# Patient Record
Sex: Female | Born: 1935
Health system: Southern US, Community
[De-identification: ages and names within clinical notes are randomized; demographics above are authoritative.]

## PROBLEM LIST (undated history)

## (undated) DIAGNOSIS — I10 Essential (primary) hypertension: Secondary | ICD-10-CM

## (undated) DIAGNOSIS — M349 Systemic sclerosis, unspecified: Secondary | ICD-10-CM

## (undated) DIAGNOSIS — I272 Pulmonary hypertension, unspecified: Secondary | ICD-10-CM

## (undated) DIAGNOSIS — N289 Disorder of kidney and ureter, unspecified: Secondary | ICD-10-CM

## (undated) DIAGNOSIS — M1990 Unspecified osteoarthritis, unspecified site: Secondary | ICD-10-CM

## (undated) DIAGNOSIS — M810 Age-related osteoporosis without current pathological fracture: Secondary | ICD-10-CM

## (undated) DIAGNOSIS — I7300 Raynaud's syndrome without gangrene: Secondary | ICD-10-CM

## (undated) DIAGNOSIS — E039 Hypothyroidism, unspecified: Secondary | ICD-10-CM

## (undated) DIAGNOSIS — IMO0001 Reserved for inherently not codable concepts without codable children: Secondary | ICD-10-CM

## (undated) DIAGNOSIS — E785 Hyperlipidemia, unspecified: Secondary | ICD-10-CM

## (undated) DIAGNOSIS — I509 Heart failure, unspecified: Secondary | ICD-10-CM

## (undated) HISTORY — DX: Hyperlipidemia, unspecified: E78.5

## (undated) HISTORY — DX: Disorder of kidney and ureter, unspecified: N28.9

## (undated) HISTORY — PX: SHOULDER ARTHROSCOPY W/ ROTATOR CUFF REPAIR: SHX2400

## (undated) HISTORY — DX: Heart failure, unspecified: I50.9

## (undated) HISTORY — PX: BREAST BIOPSY: SHX20

## (undated) HISTORY — PX: CHOLECYSTECTOMY, LAPAROSCOPIC: SHX56

## (undated) HISTORY — DX: Systemic sclerosis, unspecified: M34.9

## (undated) HISTORY — PX: CHOLECYSTECTOMY: SHX55

## (undated) HISTORY — PX: CARDIAC CATHETERIZATION: SHX172

---

## 1998-06-17 ENCOUNTER — Ambulatory Visit (HOSPITAL_COMMUNITY): Admission: RE | Admit: 1998-06-17 | Discharge: 1998-06-17 | Payer: Self-pay | Admitting: Obstetrics & Gynecology

## 1998-10-14 ENCOUNTER — Ambulatory Visit (HOSPITAL_BASED_OUTPATIENT_CLINIC_OR_DEPARTMENT_OTHER): Admission: RE | Admit: 1998-10-14 | Discharge: 1998-10-14 | Payer: Self-pay | Admitting: Surgery

## 1999-04-20 ENCOUNTER — Encounter: Admission: RE | Admit: 1999-04-20 | Discharge: 1999-04-20 | Payer: Self-pay | Admitting: Obstetrics and Gynecology

## 1999-10-10 ENCOUNTER — Encounter: Payer: Self-pay | Admitting: Obstetrics and Gynecology

## 1999-10-10 ENCOUNTER — Encounter: Admission: RE | Admit: 1999-10-10 | Discharge: 1999-10-10 | Payer: Self-pay | Admitting: Obstetrics and Gynecology

## 2000-08-09 ENCOUNTER — Encounter: Payer: Self-pay | Admitting: Emergency Medicine

## 2000-08-09 ENCOUNTER — Encounter: Admission: RE | Admit: 2000-08-09 | Discharge: 2000-08-09 | Payer: Self-pay | Admitting: Emergency Medicine

## 2000-10-18 ENCOUNTER — Encounter: Admission: RE | Admit: 2000-10-18 | Discharge: 2000-10-18 | Payer: Self-pay | Admitting: Obstetrics and Gynecology

## 2000-10-18 ENCOUNTER — Encounter: Payer: Self-pay | Admitting: Obstetrics and Gynecology

## 2000-11-05 ENCOUNTER — Other Ambulatory Visit: Admission: RE | Admit: 2000-11-05 | Discharge: 2000-11-05 | Payer: Self-pay | Admitting: Obstetrics and Gynecology

## 2001-08-12 ENCOUNTER — Encounter: Payer: Self-pay | Admitting: *Deleted

## 2001-08-12 ENCOUNTER — Encounter: Admission: RE | Admit: 2001-08-12 | Discharge: 2001-08-12 | Payer: Self-pay | Admitting: *Deleted

## 2001-10-21 ENCOUNTER — Encounter: Admission: RE | Admit: 2001-10-21 | Discharge: 2001-10-21 | Payer: Self-pay | Admitting: Family Medicine

## 2001-10-21 ENCOUNTER — Encounter: Payer: Self-pay | Admitting: Family Medicine

## 2002-11-05 ENCOUNTER — Encounter: Payer: Self-pay | Admitting: Family Medicine

## 2002-11-05 ENCOUNTER — Encounter: Admission: RE | Admit: 2002-11-05 | Discharge: 2002-11-05 | Payer: Self-pay | Admitting: Family Medicine

## 2003-01-22 ENCOUNTER — Ambulatory Visit (HOSPITAL_COMMUNITY): Admission: RE | Admit: 2003-01-22 | Discharge: 2003-01-22 | Payer: Self-pay | Admitting: Gastroenterology

## 2003-11-18 ENCOUNTER — Emergency Department (HOSPITAL_COMMUNITY): Admission: EM | Admit: 2003-11-18 | Discharge: 2003-11-18 | Payer: Self-pay | Admitting: Emergency Medicine

## 2003-12-08 ENCOUNTER — Encounter: Admission: RE | Admit: 2003-12-08 | Discharge: 2003-12-08 | Payer: Self-pay | Admitting: Family Medicine

## 2004-02-08 ENCOUNTER — Other Ambulatory Visit: Admission: RE | Admit: 2004-02-08 | Discharge: 2004-02-08 | Payer: Self-pay | Admitting: Family Medicine

## 2005-03-23 ENCOUNTER — Encounter: Admission: RE | Admit: 2005-03-23 | Discharge: 2005-03-23 | Payer: Self-pay | Admitting: Family Medicine

## 2005-10-12 ENCOUNTER — Other Ambulatory Visit: Admission: RE | Admit: 2005-10-12 | Discharge: 2005-10-12 | Payer: Self-pay | Admitting: Family Medicine

## 2006-05-17 ENCOUNTER — Encounter: Admission: RE | Admit: 2006-05-17 | Discharge: 2006-05-17 | Payer: Self-pay | Admitting: Family Medicine

## 2007-05-20 ENCOUNTER — Encounter: Admission: RE | Admit: 2007-05-20 | Discharge: 2007-05-20 | Payer: Self-pay | Admitting: Family Medicine

## 2008-01-22 ENCOUNTER — Emergency Department (HOSPITAL_COMMUNITY): Admission: EM | Admit: 2008-01-22 | Discharge: 2008-01-22 | Payer: Self-pay | Admitting: Family Medicine

## 2008-01-23 ENCOUNTER — Encounter (HOSPITAL_COMMUNITY): Payer: Self-pay | Admitting: Family Medicine

## 2008-01-23 ENCOUNTER — Ambulatory Visit (HOSPITAL_COMMUNITY): Admission: RE | Admit: 2008-01-23 | Discharge: 2008-01-23 | Payer: Self-pay | Admitting: Family Medicine

## 2008-01-23 ENCOUNTER — Ambulatory Visit: Payer: Self-pay | Admitting: Surgery

## 2008-05-20 ENCOUNTER — Encounter: Admission: RE | Admit: 2008-05-20 | Discharge: 2008-05-20 | Payer: Self-pay | Admitting: Family Medicine

## 2009-06-10 ENCOUNTER — Encounter: Admission: RE | Admit: 2009-06-10 | Discharge: 2009-06-10 | Payer: Self-pay | Admitting: Family Medicine

## 2010-06-16 ENCOUNTER — Other Ambulatory Visit: Payer: Self-pay | Admitting: Family Medicine

## 2010-06-16 DIAGNOSIS — Z1231 Encounter for screening mammogram for malignant neoplasm of breast: Secondary | ICD-10-CM

## 2010-06-23 ENCOUNTER — Ambulatory Visit
Admission: RE | Admit: 2010-06-23 | Discharge: 2010-06-23 | Disposition: A | Discharge status: Home / Self Care | Payer: BC Managed Care – PPO | Source: Ambulatory Visit | Attending: Family Medicine | Admitting: Family Medicine

## 2010-06-23 DIAGNOSIS — Z1231 Encounter for screening mammogram for malignant neoplasm of breast: Secondary | ICD-10-CM

## 2010-07-14 ENCOUNTER — Other Ambulatory Visit: Payer: Self-pay | Admitting: Podiatry

## 2010-09-09 NOTE — Op Note (Signed)
   NAME:  Meghan Wise, Meghan Wise                       ACCOUNT NO.:  [id number]   MEDICAL RECORD NO.:  [id number]                   PATIENT TYPE:  AMB   LOCATION:  ENDO                                 FACILITY:  [facility]   PHYSICIAN:  Earle Gell, M.D.                DATE OF BIRTH:  10-14-1935   DATE OF PROCEDURE:  01/22/2003  DATE OF DISCHARGE:                                 OPERATIVE REPORT   PROCEDURE:  Screening colonoscopy.   PROCEDURE INDICATION:  [name] is a 75 year old female scheduled  to undergo her first screening colonoscopy with polypectomy to prevent colon  cancer.   ENDOSCOPIST:  Earle Gell, M.D.   PREMEDICATION:  Versed 10 mg, Demerol 50 mg.   DESCRIPTION OF PROCEDURE:  After obtaining informed consent, [name]  was placed in the left lateral decubitus position.  I administered  intravenous Demerol and intravenous Versed to achieve conscious sedation for  the procedure.  The patient's blood pressure, oxygen saturation, and cardiac  rhythm were monitored throughout the procedure and documented in the medical  record.   Anal inspection was normal.  Digital rectal exam was normal.  The Olympus  adjustable pediatric colonoscope was introduced into the rectum and advanced  to the cecum.  Colonic preparation for the exam today was excellent.   Rectum normal.   Sigmoid colon and descending colon:  Extensive left colonic diverticulosis.   Splenic flexure normal.   Transverse colon normal.   Hepatic flexure normal.   Ascending colon normal.   Cecum and ileocecal valve normal.    ASSESSMENT:  Extensive left colonic diverticulosis; otherwise normal  proctocolonoscopy to the cecum.  No endoscopic evidence for the presence of  colorectal neoplasia.                                               Earle Gell, M.D.    MJ/MEDQ  D:  01/22/2003  T:  01/22/2003  Job:  [id number]   cc:   Milford Cage. Laurann Montana, M.D.  [place] [place]  [place] [address]  Fax: [phone]

## 2010-10-05 ENCOUNTER — Other Ambulatory Visit: Payer: Self-pay | Admitting: Family Medicine

## 2010-10-05 ENCOUNTER — Ambulatory Visit
Admission: RE | Admit: 2010-10-05 | Discharge: 2010-10-05 | Disposition: A | Discharge status: Home / Self Care | Payer: Medicare Other | Source: Ambulatory Visit | Attending: Family Medicine | Admitting: Family Medicine

## 2011-01-23 LAB — D-DIMER, QUANTITATIVE: D-Dimer, Quant: 0.5 — ABNORMAL HIGH

## 2011-06-26 ENCOUNTER — Other Ambulatory Visit: Payer: Self-pay | Admitting: Family Medicine

## 2011-06-30 ENCOUNTER — Other Ambulatory Visit: Payer: Self-pay | Admitting: Family Medicine

## 2011-06-30 DIAGNOSIS — Z1231 Encounter for screening mammogram for malignant neoplasm of breast: Secondary | ICD-10-CM

## 2011-07-07 ENCOUNTER — Ambulatory Visit: Payer: Medicare Other

## 2011-07-12 ENCOUNTER — Ambulatory Visit
Admission: RE | Admit: 2011-07-12 | Discharge: 2011-07-12 | Disposition: A | Discharge status: Home / Self Care | Payer: Medicare Other | Source: Ambulatory Visit | Attending: Family Medicine | Admitting: Family Medicine

## 2011-07-12 DIAGNOSIS — Z1231 Encounter for screening mammogram for malignant neoplasm of breast: Secondary | ICD-10-CM

## 2012-05-24 ENCOUNTER — Inpatient Hospital Stay (HOSPITAL_COMMUNITY): Payer: Medicare Other

## 2012-05-24 ENCOUNTER — Encounter: Payer: Self-pay | Admitting: Internal Medicine

## 2012-05-24 ENCOUNTER — Inpatient Hospital Stay (HOSPITAL_COMMUNITY)
Admission: AD | Admit: 2012-05-24 | Discharge: 2012-05-28 | DRG: 640 | Disposition: A | Discharge status: Home / Self Care | Payer: Medicare Other | Source: Ambulatory Visit | Attending: Internal Medicine | Admitting: Internal Medicine

## 2012-05-24 ENCOUNTER — Encounter (HOSPITAL_COMMUNITY): Payer: Self-pay | Admitting: Internal Medicine

## 2012-05-24 DIAGNOSIS — N179 Acute kidney failure, unspecified: Secondary | ICD-10-CM

## 2012-05-24 DIAGNOSIS — E039 Hypothyroidism, unspecified: Secondary | ICD-10-CM | POA: Diagnosis present

## 2012-05-24 DIAGNOSIS — I129 Hypertensive chronic kidney disease with stage 1 through stage 4 chronic kidney disease, or unspecified chronic kidney disease: Secondary | ICD-10-CM | POA: Diagnosis present

## 2012-05-24 DIAGNOSIS — E860 Dehydration: Secondary | ICD-10-CM | POA: Diagnosis present

## 2012-05-24 DIAGNOSIS — E673 Hypervitaminosis D: Principal | ICD-10-CM | POA: Diagnosis present

## 2012-05-24 DIAGNOSIS — K5900 Constipation, unspecified: Secondary | ICD-10-CM | POA: Diagnosis present

## 2012-05-24 DIAGNOSIS — M1990 Unspecified osteoarthritis, unspecified site: Secondary | ICD-10-CM

## 2012-05-24 DIAGNOSIS — I5032 Chronic diastolic (congestive) heart failure: Secondary | ICD-10-CM | POA: Diagnosis present

## 2012-05-24 DIAGNOSIS — E43 Unspecified severe protein-calorie malnutrition: Secondary | ICD-10-CM | POA: Diagnosis present

## 2012-05-24 DIAGNOSIS — E876 Hypokalemia: Secondary | ICD-10-CM

## 2012-05-24 DIAGNOSIS — N182 Chronic kidney disease, stage 2 (mild): Secondary | ICD-10-CM

## 2012-05-24 DIAGNOSIS — R627 Adult failure to thrive: Secondary | ICD-10-CM | POA: Diagnosis present

## 2012-05-24 DIAGNOSIS — IMO0002 Reserved for concepts with insufficient information to code with codable children: Secondary | ICD-10-CM

## 2012-05-24 DIAGNOSIS — Z79899 Other long term (current) drug therapy: Secondary | ICD-10-CM

## 2012-05-24 DIAGNOSIS — D649 Anemia, unspecified: Secondary | ICD-10-CM | POA: Diagnosis present

## 2012-05-24 DIAGNOSIS — I7300 Raynaud's syndrome without gangrene: Secondary | ICD-10-CM | POA: Diagnosis present

## 2012-05-24 DIAGNOSIS — R599 Enlarged lymph nodes, unspecified: Secondary | ICD-10-CM | POA: Diagnosis present

## 2012-05-24 DIAGNOSIS — R590 Localized enlarged lymph nodes: Secondary | ICD-10-CM

## 2012-05-24 DIAGNOSIS — K219 Gastro-esophageal reflux disease without esophagitis: Secondary | ICD-10-CM | POA: Diagnosis present

## 2012-05-24 DIAGNOSIS — I509 Heart failure, unspecified: Secondary | ICD-10-CM | POA: Diagnosis present

## 2012-05-24 DIAGNOSIS — I10 Essential (primary) hypertension: Secondary | ICD-10-CM | POA: Diagnosis present

## 2012-05-24 DIAGNOSIS — Z7982 Long term (current) use of aspirin: Secondary | ICD-10-CM

## 2012-05-24 DIAGNOSIS — I5033 Acute on chronic diastolic (congestive) heart failure: Secondary | ICD-10-CM | POA: Diagnosis present

## 2012-05-24 DIAGNOSIS — J811 Chronic pulmonary edema: Secondary | ICD-10-CM | POA: Diagnosis not present

## 2012-05-24 DIAGNOSIS — M810 Age-related osteoporosis without current pathological fracture: Secondary | ICD-10-CM

## 2012-05-24 HISTORY — DX: Raynaud's syndrome without gangrene: I73.00

## 2012-05-24 HISTORY — DX: Age-related osteoporosis without current pathological fracture: M81.0

## 2012-05-24 HISTORY — DX: Essential (primary) hypertension: I10

## 2012-05-24 HISTORY — DX: Hypothyroidism, unspecified: E03.9

## 2012-05-24 HISTORY — DX: Unspecified osteoarthritis, unspecified site: M19.90

## 2012-05-24 LAB — URINALYSIS, ROUTINE W REFLEX MICROSCOPIC
Hgb urine dipstick: NEGATIVE
Nitrite: NEGATIVE
Protein, ur: NEGATIVE mg/dL
Specific Gravity, Urine: 1.013 (ref 1.005–1.030)
Urobilinogen, UA: 0.2 mg/dL (ref 0.0–1.0)

## 2012-05-24 LAB — BASIC METABOLIC PANEL
Calcium: >15 mg/dL (ref 8.4–10.5)
GFR calc Af Amer: 14 mL/min — ABNORMAL LOW (ref >90)
GFR calc non Af Amer: 12 mL/min — ABNORMAL LOW (ref >90)
Glucose, Bld: 87 mg/dL (ref 70–99)
Sodium: 138 mEq/L (ref 135–145)

## 2012-05-24 LAB — URINE MICROSCOPIC-ADD ON

## 2012-05-24 MED ORDER — ADULT MULTIVITAMIN W/MINERALS CH
1.0000 | ORAL_TABLET | Freq: Every morning | ORAL | Status: DC
  Administered 2012-05-24 – 2012-05-28 (×5): 1 via ORAL
  Filled 2012-05-24 (×5): qty 1

## 2012-05-24 MED ORDER — FA-PYRIDOXINE-CYANOCOBALAMIN 2.5-25-2 MG PO TABS
1.0000 | ORAL_TABLET | Freq: Every day | ORAL | Status: DC
  Administered 2012-05-24 – 2012-05-28 (×5): 1 via ORAL
  Filled 2012-05-24 (×5): qty 1

## 2012-05-24 MED ORDER — LEVOTHYROXINE SODIUM 50 MCG PO TABS
50.0000 ug | ORAL_TABLET | Freq: Every day | ORAL | Status: DC
  Administered 2012-05-25 – 2012-05-28 (×4): 50 ug via ORAL
  Filled 2012-05-24 (×6): qty 1

## 2012-05-24 MED ORDER — SODIUM CHLORIDE 0.9 % IJ SOLN
3.0000 mL | Freq: Two times a day (BID) | INTRAMUSCULAR | Status: DC
  Administered 2012-05-24 – 2012-05-28 (×5): 3 mL via INTRAVENOUS

## 2012-05-24 MED ORDER — PANTOPRAZOLE SODIUM 40 MG PO TBEC
40.0000 mg | DELAYED_RELEASE_TABLET | Freq: Every day | ORAL | Status: DC
  Administered 2012-05-25 – 2012-05-28 (×4): 40 mg via ORAL
  Filled 2012-05-24 (×2): qty 1

## 2012-05-24 MED ORDER — ACETAMINOPHEN 650 MG RE SUPP: 650.0000 mg | Freq: Four times a day (QID) | RECTAL | Status: DC | PRN

## 2012-05-24 MED ORDER — SODIUM CHLORIDE 0.9 % IV SOLN
INTRAVENOUS | Status: DC
  Administered 2012-05-24 – 2012-05-26 (×6): via INTRAVENOUS

## 2012-05-24 MED ORDER — POLYETHYLENE GLYCOL 3350 17 G PO PACK
17.0000 g | PACK | Freq: Every day | ORAL | Status: DC | PRN
  Filled 2012-05-24: qty 1

## 2012-05-24 MED ORDER — ASPIRIN EC 325 MG PO TBEC
325.0000 mg | DELAYED_RELEASE_TABLET | Freq: Every day | ORAL | Status: DC
  Administered 2012-05-24 – 2012-05-28 (×5): 325 mg via ORAL
  Filled 2012-05-24 (×5): qty 1

## 2012-05-24 MED ORDER — SODIUM CHLORIDE 0.9 % IV BOLUS (SEPSIS)
500.0000 mL | Freq: Once | INTRAVENOUS | Status: AC
  Administered 2012-05-24: 500 mL via INTRAVENOUS

## 2012-05-24 MED ORDER — SIMVASTATIN 20 MG PO TABS
20.0000 mg | ORAL_TABLET | Freq: Every evening | ORAL | Status: DC
  Administered 2012-05-24 – 2012-05-28 (×5): 20 mg via ORAL
  Filled 2012-05-24 (×5): qty 1

## 2012-05-24 MED ORDER — ACETAMINOPHEN 325 MG PO TABS: 650.0000 mg | ORAL_TABLET | Freq: Four times a day (QID) | ORAL | Status: DC | PRN

## 2012-05-24 MED ORDER — SENNA 8.6 MG PO TABS
1.0000 | ORAL_TABLET | Freq: Two times a day (BID) | ORAL | Status: DC
  Administered 2012-05-24 – 2012-05-27 (×4): 8.6 mg via ORAL
  Filled 2012-05-24 (×8): qty 1

## 2012-05-24 MED ORDER — ENOXAPARIN SODIUM 30 MG/0.3ML ~~LOC~~ SOLN
30.0000 mg | SUBCUTANEOUS | Status: DC
  Administered 2012-05-24 – 2012-05-27 (×4): 30 mg via SUBCUTANEOUS
  Filled 2012-05-24 (×6): qty 0.3

## 2012-05-24 NOTE — H&P (Signed)
Meghan Wise History and Physical  Meghan Wise JKD:[id number] DOB: 06-06-1935 DOA: 05/24/2012  Referring physician: PCP PCP: Meghan Casco, MD  Specialists: None  Chief Complaint: Weak  HPI: Meghan Wise is a 77 y.o. female without any significant past medical history was referred from primary care physician office for a 2 month course of progressive weakness, 20 pound weight loss, bone pains, constipation, failure to thrive. Labs done at the primary care physician office revealed severe hypercalcemia with low PTH. Patient has been taking 600 mg of calcium for osteoporosis. She also has been on HCTZ. She was also found to have a TSH of 9.4 and was started on Synthroid yesterday. Workup in the primary care physician office also revealed acute renal failure with a creatinine of 3. The patient has been on an ACE inhibitor until yesterday.   Review of Systems:  Positive for chronic cough, and nonproductive, not associated with chills The patient denies anorexia, fever, weight loss,, vision loss, decreased hearing, hoarseness, syncope, dyspnea on exertion, peripheral edema, balance deficits, hemoptysis, abdominal pain, melena, hematochezia, severe indigestion/heartburn, hematuria, incontinence, genital sores, muscle weakness, suspicious skin lesions, transient blindness, difficulty walking, depression, unusual weight change, abnormal bleeding, enlarged lymph nodes, angioedema, and breast masses.    Past Medical History  Diagnosis Date  . Osteoporosis   . HTN (hypertension)   . Osteoarthritis   . Raynaud disease   . Hypothyroidism    Past Surgical History  Procedure Date  . Cholecystectomy, laparoscopic    Social History:  reports that she has quit smoking. Her smoking use included Cigarettes. She quit after 60 years of use. She has never used smokeless tobacco. She reports that she does not drink alcohol or use illicit drugs. Lives home with husband   Allergies   Allergen Reactions  . Ace Inhibitors Cough  . Codeine Nausea Only  . Sulfa Antibiotics Hives    Family History  Problem Relation Age of Onset  . Cancer Brother           Physical Exam: Filed Vitals:   05/24/12 1058 05/24/12 1439  BP: 145/71 155/66  Pulse: 68 71  Temp: 98.5 F (36.9 C) 97.8 F (36.6 C)  TempSrc: Oral Oral  Resp: 18 20  Height: _0  (1.499 m)   Weight: 48.081 kg (106 lb)   SpO2: 92%      General:  Lethargic and somnolent  Eyes: Pupils are 3 mm, symmetric and reactive  ENT: Significant oral mucosal dryness  Neck: No jugular venous distention  Cardiovascular: Regular rate and rhythm without murmurs rubs or gallops  Respiratory: Clear to auscultation bilaterally  Abdomen: Soft, nontender, bowel sounds present  Skin: Pale dry no rashes  Musculoskeletal: Intact muscle bulk and tone  Psychiatric: Euthymic  Neurologic: Cranial nerves 2-12 intact, strength 5/5 all 4, sensation intact  Labs on Admission:  Basic Metabolic Panel:  Lab 09/98/33 1153  NA 138  K 3.6  CL 98  CO2 27  GLUCOSE 87  BUN 52*  CREATININE 3.39*  CALCIUM >15.0*  MG --  PHOS --   Liver Function Tests: No results found for this basename: AST:5,ALT:5,ALKPHOS:5,BILITOT:5,PROT:5,ALBUMIN:5 in the last 168 hours No results found for this basename: LIPASE:5,AMYLASE:5 in the last 168 hours No results found for this basename: AMMONIA:5 in the last 168 hours CBC: No results found for this basename: WBC:5,NEUTROABS:5,HGB:5,HCT:5,MCV:5,PLT:5 in the last 168 hours Cardiac Enzymes: No results found for this basename: CKTOTAL:5,CKMB:5,CKMBINDEX:5,TROPONINI:5 in the last 168 hours  BNP (last 3 results) No  results found for this basename: PROBNP:3 in the last 8760 hours CBG: No results found for this basename: GLUCAP:5 in the last 168 hours  Radiological Exams on Admission: No results found.    Assessment/Plan Active Problems:  Hypercalcemia  Dehydration  ARF (acute  renal failure)  CKD (chronic kidney disease) stage 2, GFR 60-89 ml/min  Osteoporosis  HTN (hypertension)  Osteoarthritis  Raynaud disease  Hypothyroidism   1. Severe hypercalcemia- etiology is unclear. Hyperparathyroidism has been excluded with a low PTH level. Other possibilities are exogenous calcium supplementation combined with HCTZ use and severe dehydration. Other possibilities or parathyroid related hormone production, sarcoidosis, lymphoma or myeloma. We will start treatment with IV fluids as the patient is severely volume depleted and follow calcium levels. We will complete workup with vitamin D level, ACE level, serum protein electrophoresis,PTH rp level. 2. Newly diagnosed hypothyroidism-start Synthroid 50 mcg daily 3. Acute renal failure-most likely from dehydration in the setting of severe hypercalcemia. Will obtain urinalysis to look for an active sediment. We'll start IV fluids, monitor urine output and serial basic metabolic profiles 4. Hypertension-for now we will hold off on antihypertensive medications 5. Chronic cough in a patient with a long history of tobacco abuse, who has lost 20 pounds. We will obtain a chest CT to rule out lung cancer. We'll start PPI   Code Status: full  Family Communication: husband at bedside  Disposition Plan: home   Anticipated LOS 4 days  Time spent: 1 hour  Meghan Wise Meghan Wise Pager [phone]  If 7PM-7AM, please contact night-coverage [email or web address] Password [facility] 05/24/2012, 2:55 PM

## 2012-05-24 NOTE — Progress Notes (Signed)
Patient ID: Meghan Wise, female   DOB: [birth year], 77 y.o.   MRN: [id number] Called from office by PCP Patient presented with weakness and FTT Labs indicating ARF creatinine 3.08 Hypercalcemia at 16.4 TSH at 9.44 And low PTH All of these findings are new. Plan for admission for iv fluids and w/u of new hypercalcemia Records being faxed to [facility] Meghan [facility]

## 2012-05-25 ENCOUNTER — Inpatient Hospital Stay (HOSPITAL_COMMUNITY): Payer: Medicare Other

## 2012-05-25 DIAGNOSIS — N182 Chronic kidney disease, stage 2 (mild): Secondary | ICD-10-CM

## 2012-05-25 DIAGNOSIS — K5900 Constipation, unspecified: Secondary | ICD-10-CM

## 2012-05-25 LAB — BASIC METABOLIC PANEL
Calcium: 14 mg/dL (ref 8.4–10.5)
GFR calc non Af Amer: 13 mL/min — ABNORMAL LOW (ref >90)
Sodium: 139 mEq/L (ref 135–145)

## 2012-05-25 MED ORDER — BOOST / RESOURCE BREEZE PO LIQD
1.0000 | Freq: Three times a day (TID) | ORAL | Status: DC
  Administered 2012-05-25 – 2012-05-28 (×7): 1 via ORAL

## 2012-05-25 MED ORDER — IOHEXOL 300 MG/ML  SOLN
25.0000 mL | INTRAMUSCULAR | Status: AC
  Administered 2012-05-25 (×2): 25 mL via ORAL

## 2012-05-25 MED ORDER — HYDROCOD POLST-CHLORPHEN POLST 10-8 MG/5ML PO LQCR: 5.0000 mL | Freq: Two times a day (BID) | ORAL | Status: DC | PRN

## 2012-05-25 MED ORDER — POLYETHYLENE GLYCOL 3350 17 G PO PACK
68.0000 g | PACK | Freq: Once | ORAL | Status: DC
  Filled 2012-05-25 (×2): qty 4

## 2012-05-25 NOTE — Progress Notes (Signed)
MD on call notified of pt c/o of SOB upon ambulation to the restroom. Lungs sounds clear. SpO2 88% on RA. 2L of O2 applied via [place] pt current sat is 94%. Will continue to monitor the pt. Meghan Wise

## 2012-05-25 NOTE — Progress Notes (Signed)
INITIAL NUTRITION ASSESSMENT  DOCUMENTATION CODES Per approved criteria  -Severe malnutrition in the context of chronic illness -Underweight   Pt meets criteria for SEVERE MALNUTRITION in the context of chronic illness as evidenced by 27% weight loss x 1 year, poor intake estimated to be < 75% of her estimated needs in > 1 month.   INTERVENTION: Resource Breeze po TID, each supplement provides 250 kcal and 9 grams of protein.  NUTRITION DIAGNOSIS: Malnutrition related to chronic illness as evidenced by  27% weight loss x 1 year, poor intake estimated to be < 75% of her estimated needs in > 1 month.  Goal: Pt to meet >/= 90% of their estimated nutrition needs.   Monitor:  PO intake, supplement acceptance, weight  Reason for Assessment: Malnutrition Screening Tool  77 y.o. female  Admitting Dx:  Progressive weakness  ASSESSMENT: Pt admitted with hx of 2 month course of progressive weakness, 20 pound weight loss, bone pains, constipation, failure to thrive from PCP.  Pt being treated for new hypercalcemia, ARF and elevated TSH of 9.44.  Pt confirms weight loss and poor appetite x 1 year. States husband has been sick so she has been unable to get to the doctor herself.    Height: Ht Readings from Last 1 Encounters:  05/24/12 _0  (1.499 m)    Weight: Wt Readings from Last 1 Encounters:  05/25/12 88 lb 3.2 oz (40.007 kg)    Ideal Body Weight: 44.6 kg  % Ideal Body Weight: 90%  Wt Readings from Last 10 Encounters:  05/25/12 88 lb 3.2 oz (40.007 kg)    Usual Body Weight: 120 lb 1 year ago  % Usual Body Weight: 73%  BMI:  Body mass index is 17.81 kg/(m^2). Underweight  Estimated Nutritional Needs: Kcal: 1400-1600 Protein: 60-75 grams Fluid: >1.5 L/day  Skin: no issues noted  Diet Order: General Meal Completion: 75%  EDUCATION NEEDS: -No education needs identified at this time   Intake/Output Summary (Last 24 hours) at 05/25/12 1238 Last data filed at  05/25/12 0959  Gross per 24 hour  Intake    720 ml  Output   1227 ml  Net   -507 ml    Last BM: PTA   Labs:   Lab 05/25/12 0500 05/24/12 1153  NA 139 138  K 3.4* 3.6  CL 103 98  CO2 29 27  BUN 49* 52*  CREATININE 3.35* 3.39*  CALCIUM 14.0* >15.0*  MG -- --  PHOS -- --  GLUCOSE 86 87    CBG (last 3)  No results found for this basename: GLUCAP:3 in the last 72 hours  Scheduled Meds:   . aspirin EC  325 mg Oral Daily  . enoxaparin (LOVENOX) injection  30 mg Subcutaneous Q24H  . folic acid-pyridoxine-cyancobalamin  1 tablet Oral Daily  . iohexol  25 mL Oral Q1 Hr x 2  . levothyroxine  50 mcg Oral QAC breakfast  . multivitamin with minerals  1 tablet Oral q morning - 10a  . pantoprazole  40 mg Oral Q1200  . polyethylene glycol  68 g Oral Once  . senna  1 tablet Oral BID  . simvastatin  20 mg Oral QPM  . sodium chloride  3 mL Intravenous Q12H    Continuous Infusions:   . sodium chloride 150 mL/hr at 05/25/12 0221    Past Medical History  Diagnosis Date  . Osteoporosis   . HTN (hypertension)   . Osteoarthritis   . Raynaud disease   .  Hypothyroidism     Past Surgical History  Procedure Date  . Cholecystectomy, laparoscopic     Maylon Peppers RD, LDN, [place] Weekend/After Hours Pager

## 2012-05-25 NOTE — Progress Notes (Signed)
[facility] PROGRESS NOTE  Meghan Wise TDV:[id number] DOB: 1935-05-02 DOA: 05/24/2012 PCP: Meghan Casco, MD  Assessment/Plan: 1. Severe symptomatic hypercalcemia - multifactorial. Patient on oral calcium and HCTZ prior to admission. I am also suspecting sarcoidosis or lymphoma. ACE level elevated. CT scan of the chest with pulmonary nodules and mediastinal adenopathy. CT scan abdomen and pelvis plan for today to rule out primary abdominal or gynecological malignancy. The patient was severely volume depleted on admission and she was started on IV normal saline at 150 cc an hour.  By February 1 the patient is still deemed to be volume depleted - and we plan to continue IV normal saline. Calcium level has decreased to 14. Hyperparathyroidism ruled out by a low PTH level.  2. Acute kidney injury - most likely secondary to ACE inhibitor and dehydration driven by hypercalcemia. Adequate urinary output. No major hydronephrosis on imaging. Urinary sediment without red blood cells or protein making glomerulonephritis highly unlikely. We'll continue IV fluids and continue to monitor renal function 3. Hypothyroidism-continue Synthroid 4. Severe constipation-prescribed MiraLAX bowel prep February 1  Code Status: full Family Communication: husband (indicate person spoken with, relationship, and if by phone, the number) Disposition Plan: home   Consultants:  none  Procedures:  CT chest   HPI/Subjective: Weak and dehydrated  Objective: Filed Vitals:   05/24/12 1058 05/24/12 1439 05/24/12 2100 05/25/12 0601  BP: 145/71 155/66 144/63 158/64  Pulse: 68 71 70 56  Temp: 98.5 F (36.9 C) 97.8 F (36.6 C) 98.2 F (36.8 C) 98 F (36.7 C)  TempSrc: Oral Oral    Resp: _0 Height: _1  (1.499 m)     Weight: 48.081 kg (106 lb)   40.007 kg (88 lb 3.2 oz)  SpO2: 92%  92% 97%    Intake/Output Summary (Last 24 hours) at 05/25/12 1331 Last data filed at 05/25/12 0959   Gross per 24 hour  Intake    720 ml  Output   1227 ml  Net   -507 ml   Filed Weights   05/24/12 1058 05/25/12 0601  Weight: 48.081 kg (106 lb) 40.007 kg (88 lb 3.2 oz)    Exam:   General:  Alert and oriented x3  Cardiovascular: Regular rate and rhythm without murmurs rubs or gallops  Respiratory: Clear to auscultation bilaterally  Abdomen: Soft nontender bowel sounds present  Data Reviewed: Basic Metabolic Panel:  Lab 10/17/92 0500 05/24/12 1153  NA 139 138  K 3.4* 3.6  CL 103 98  CO2 29 27  GLUCOSE 86 87  BUN 49* 52*  CREATININE 3.35* 3.39*  CALCIUM 14.0* >15.0*  MG -- --  PHOS -- --   Liver Function Tests: No results found for this basename: AST:5,ALT:5,ALKPHOS:5,BILITOT:5,PROT:5,ALBUMIN:5 in the last 168 hours No results found for this basename: LIPASE:5,AMYLASE:5 in the last 168 hours No results found for this basename: AMMONIA:5 in the last 168 hours CBC: No results found for this basename: WBC:5,NEUTROABS:5,HGB:5,HCT:5,MCV:5,PLT:5 in the last 168 hours Cardiac Enzymes: No results found for this basename: CKTOTAL:5,CKMB:5,CKMBINDEX:5,TROPONINI:5 in the last 168 hours BNP (last 3 results) No results found for this basename: PROBNP:3 in the last 8760 hours CBG: No results found for this basename: GLUCAP:5 in the last 168 hours  No results found for this or any previous visit (from the past 240 hour(s)).   Studies: Ct Chest Wo Contrast  05/24/2012  *RADIOLOGY REPORT*  Clinical Data: Cough and weight loss.  CT CHEST WITHOUT CONTRAST  Technique:  Multidetector  CT imaging of the chest was performed following the standard protocol without IV contrast.  Comparison: None  Findings: The chest wall is unremarkable.  No supraclavicular or axillary adenopathy.  Small nodes are noted.  The bony thorax is intact.  No destructive bone lesions or spinal canal compromise.  There are enlarged mediastinal and hilar lymph nodes.  The aorta is normal in caliber.  Coronary artery  calcifications are noted.  The esophagus is grossly normal.  No pericardial effusion.  Heart size is normal.  Examination of the lung parenchyma demonstrates chronic bronchitic changes and probable interstitial lung disease.  There are small pulmonary nodules.  No pulmonary mass or pleural effusion.  No bronchiectasis.  The visualized upper abdomen is grossly normal.  Intra and extrahepatic biliary dilatation is noted.  This could be due to prior cholecystectomy.  Recommend correlation liver function studies.  IMPRESSION:  1.  Mediastinal and hilar lymphadenopathy. 2.  Multiple small pulmonary nodules.  These are indeterminate.  No obvious lung mass.  Metastatic disease is possible.  PET CT may be helpful for further evaluation.   Original Report Authenticated By: Marijo Sanes, M.D.     Scheduled Meds:    . aspirin EC  325 mg Oral Daily  . enoxaparin (LOVENOX) injection  30 mg Subcutaneous Q24H  . folic acid-pyridoxine-cyancobalamin  1 tablet Oral Daily  . iohexol  25 mL Oral Q1 Hr x 2  . levothyroxine  50 mcg Oral QAC breakfast  . multivitamin with minerals  1 tablet Oral q morning - 10a  . pantoprazole  40 mg Oral Q1200  . polyethylene glycol  68 g Oral Once  . senna  1 tablet Oral BID  . simvastatin  20 mg Oral QPM  . sodium chloride  3 mL Intravenous Q12H   Continuous Infusions:    . sodium chloride 150 mL/hr at 05/25/12 0221    Active Problems:  Hypercalcemia  Dehydration  ARF (acute renal failure)  CKD (chronic kidney disease) stage 2, GFR 60-89 ml/min  Osteoporosis  HTN (hypertension)  Osteoarthritis  Raynaud disease  Hypothyroidism  Mediastinal lymphadenopathy  Constipation      Meghan Wise  [facility] Pager [phone]. If 8PM-8AM, please contact night-coverage at [email or web address], password [facility] 05/25/2012, 1:31 PM  LOS: 1 day

## 2012-05-25 NOTE — Progress Notes (Signed)
CRITICAL VALUE ALERT  Critical value received:  Calcium of 14  Date of notification:  05/25/2012   Time of notification:  0553  Critical value read back:yes  Nurse who received alert:  Nevin Bloodgood  MD notified (1st page):  Walden Field MD  Time of first page:  0610  MD notified (2nd page):  Time of second page:  Responding MD:  Walden Field MD  Time MD responded:  [phone]

## 2012-05-26 ENCOUNTER — Inpatient Hospital Stay (HOSPITAL_COMMUNITY): Payer: Medicare Other

## 2012-05-26 DIAGNOSIS — R599 Enlarged lymph nodes, unspecified: Secondary | ICD-10-CM

## 2012-05-26 LAB — HEPATIC FUNCTION PANEL
ALT: 18 U/L (ref 0–35)
AST: 29 U/L (ref 0–37)
Albumin: 2.9 g/dL — ABNORMAL LOW (ref 3.5–5.2)
Alkaline Phosphatase: 54 U/L (ref 39–117)
Total Protein: 6.2 g/dL (ref 6.0–8.3)

## 2012-05-26 LAB — FERRITIN: Ferritin: 60 ng/mL (ref 10–291)

## 2012-05-26 LAB — RETICULOCYTES: Retic Count, Absolute: 47.7 10*3/uL (ref 19.0–186.0)

## 2012-05-26 LAB — BASIC METABOLIC PANEL
CO2: 21 mEq/L (ref 19–32)
Chloride: 112 mEq/L (ref 96–112)
Potassium: 3.2 mEq/L — ABNORMAL LOW (ref 3.5–5.1)
Sodium: 143 mEq/L (ref 135–145)

## 2012-05-26 LAB — CBC
Platelets: 189 10*3/uL (ref 150–400)
RBC: 3.21 MIL/uL — ABNORMAL LOW (ref 3.87–5.11)
WBC: 6.4 10*3/uL (ref 4.0–10.5)

## 2012-05-26 MED ORDER — POLYSACCHARIDE IRON COMPLEX 150 MG PO CAPS
150.0000 mg | ORAL_CAPSULE | Freq: Every day | ORAL | Status: DC
  Administered 2012-05-27 – 2012-05-28 (×2): 150 mg via ORAL
  Filled 2012-05-26 (×2): qty 1

## 2012-05-26 MED ORDER — ALUM & MAG HYDROXIDE-SIMETH 200-200-20 MG/5ML PO SUSP: 15.0000 mL | ORAL | Status: DC | PRN

## 2012-05-26 MED ORDER — FUROSEMIDE 10 MG/ML IJ SOLN
INTRAMUSCULAR | Status: AC
  Filled 2012-05-26: qty 4

## 2012-05-26 MED ORDER — FUROSEMIDE 10 MG/ML IJ SOLN
40.0000 mg | Freq: Once | INTRAMUSCULAR | Status: AC
  Administered 2012-05-26: 40 mg via INTRAVENOUS

## 2012-05-26 MED ORDER — LORAZEPAM 0.5 MG PO TABS
0.5000 mg | ORAL_TABLET | Freq: Four times a day (QID) | ORAL | Status: DC | PRN
  Administered 2012-05-26 (×2): 0.5 mg via ORAL
  Filled 2012-05-26 (×2): qty 1

## 2012-05-26 MED ORDER — POTASSIUM CHLORIDE CRYS ER 20 MEQ PO TBCR
40.0000 meq | EXTENDED_RELEASE_TABLET | Freq: Once | ORAL | Status: AC
  Administered 2012-05-26: 40 meq via ORAL
  Filled 2012-05-26: qty 2

## 2012-05-26 NOTE — Progress Notes (Signed)
Checking on pt, she has voided twice since IV lasix given, o2 sats 99% on 4l [place]. O2 decreased to 2l [place]. Patient feels "a little better!". CXR results texted to MD. Pts crackles a bit more prominent than before. Awaiting return call of md

## 2012-05-26 NOTE — Progress Notes (Signed)
[facility] PROGRESS NOTE  Meghan Wise YME:[id number] DOB: Aug 31, 1935 DOA: 05/24/2012 PCP: Osborne Casco, MD  Assessment/Plan: 1. Severe symptomatic hypercalcemia - multifactorial. Patient on oral calcium and HCTZ prior to admission. I am also suspecting sarcoidosis or lymphoma. ACE level elevated. CT scan of the chest with pulmonary nodules and mediastinal adenopathy. CT scan abdomen and pelvis negative for major intraabdominal malignancy   or gynecological malignancy. The patient was severely volume depleted on admission and she was started on IV normal saline at 150 cc an hour.  By February 1 the patient is still deemed to be volume depleted - and we did continue IV normal saline. Calcium level has decreased to 14 on 2/1 and further decreased to 11.6 by 2/2. Hyperparathyroidism ruled out by a low PTH level done in the office . PTHrP , vitamin D and SPEP still pending  2. Mediastinal adenopathy - DDx - sarcoidosis, lymphoma, metastatic malignancy of unclear primary source. Doubt acute or chronic infectious process . Pulmonary consult 2/2 done by Dr. Melvyn Novas - he recommended outpatient f/u. Marland Kitchen  3. Acute kidney injury - most likely secondary to ACE inhibitor and dehydration driven by hypercalcemia. Adequate urinary output. No major hydronephrosis on imaging. Urinary sediment without red blood cells or protein making glomerulonephritis highly unlikely. Renal function improved with IV fluids 4. Newly diagnosed hypothyroidism- started on  Synthroid 05/23/2012 .  5. Severe constipation-prescribed MiraLAX bowel prep February 1 - with great success.  6. Anemia - Hb dropped after hydration - suspect chronic.  Ferritin is low. We checked  LDH, hapto, retic and they are wnl . B12 done as outpt and normal.  7. Dilated CBD - with normal LFTs as outpt - no GI symptoms.  8. Long history of GERD - started PPI 05/26/12   Code Status: full Family Communication: husband  Disposition Plan:  home   Consultants:  Pulm   Procedures:  CT chest   CT abdomen   HPI/Subjective: Finally feels better   Objective: Filed Vitals:   05/25/12 2100 05/26/12 0255 05/26/12 0641 05/26/12 0650  BP: 162/74 154/55 168/66 144/58  Pulse: 63 58 65   Temp: 98.1 F (36.7 C)  98.1 F (36.7 C)   TempSrc: Oral  Oral   Resp: _0 Height:      Weight:      SpO2: 94% 96% 94%     Intake/Output Summary (Last 24 hours) at 05/26/12 0743 Last data filed at 05/26/12 0538  Gross per 24 hour  Intake    600 ml  Output   1127 ml  Net   -527 ml   Filed Weights   05/24/12 1058 05/25/12 0601  Weight: 48.081 kg (106 lb) 40.007 kg (88 lb 3.2 oz)    Exam:   General:  Alert and oriented x3  Cardiovascular: Regular rate and rhythm without murmurs rubs or gallops  Respiratory: Clear to auscultation bilaterally  Abdomen: Soft nontender bowel sounds present  Data Reviewed: Basic Metabolic Panel:  Lab 68/08/81 0550 05/25/12 0500 05/24/12 1153  NA 143 139 138  K 3.2* 3.4* 3.6  CL 112 103 98  CO2 _1 GLUCOSE 74 86 87  BUN 36* 49* 52*  CREATININE 2.61* 3.35* 3.39*  CALCIUM 11.6* 14.0* >15.0*  MG -- -- --  PHOS -- -- --   Liver Function Tests: No results found for this basename: AST:5,ALT:5,ALKPHOS:5,BILITOT:5,PROT:5,ALBUMIN:5 in the last 168 hours No results found for this basename: LIPASE:5,AMYLASE:5 in the last 168 hours  No results found for this basename: AMMONIA:5 in the last 168 hours CBC:  Lab 05/26/12 0550  WBC 6.4  NEUTROABS --  HGB 10.1*  HCT 28.9*  MCV 90.0  PLT 189   Cardiac Enzymes: No results found for this basename: CKTOTAL:5,CKMB:5,CKMBINDEX:5,TROPONINI:5 in the last 168 hours BNP (last 3 results) No results found for this basename: PROBNP:3 in the last 8760 hours CBG: No results found for this basename: GLUCAP:5 in the last 168 hours  No results found for this or any previous visit (from the past 240 hour(s)).   Studies: Ct Abdomen Pelvis  Wo Contrast  05/25/2012  *RADIOLOGY REPORT*  Clinical Data: Mediastinal and hilar lymphadenopathy, pulmonary nodules, and weight loss.  Evaluate for carcinoma.  CT ABDOMEN AND PELVIS WITHOUT CONTRAST  Technique:  Multidetector CT imaging of the abdomen and pelvis was performed following the standard protocol without intravenous contrast.  Comparison: None  Findings: Prior cholecystectomy noted.  Biliary ductal dilatation is seen, which may be related to prior cholecystectomy.  No liver masses are identified on this noncontrast study.  Noncontrast images of the pancreas, spleen, adrenal glands, and kidneys are also unremarkable in appearance.  No evidence of hydronephrosis. No soft tissue masses or lymphadenopathy identified within the abdomen or pelvis.  Uterus and adnexal regions are unremarkable in appearance.  No evidence of inflammatory process or abnormal fluid collections.  No evidence of bowel wall thickening or dilatation.  Diverticulosis is noted, however there is no evidence of diverticulitis.  No suspicious bone lesions identified.  IMPRESSION:  1.  No mass or lymphadenopathy identified. 2.  Biliary ductal dilatation which may be related to prior cholecystectomy.  Recommend correlation with liver function tests, and consider MRCP for further imaging evaluation if clinically warranted. 3.  Diverticulosis.  No radiographic evidence of diverticulitis.   Original Report Authenticated By: Earle Gell, M.D.    Ct Chest Wo Contrast  05/24/2012  *RADIOLOGY REPORT*  Clinical Data: Cough and weight loss.  CT CHEST WITHOUT CONTRAST  Technique:  Multidetector CT imaging of the chest was performed following the standard protocol without IV contrast.  Comparison: None  Findings: The chest wall is unremarkable.  No supraclavicular or axillary adenopathy.  Small nodes are noted.  The bony thorax is intact.  No destructive bone lesions or spinal canal compromise.  There are enlarged mediastinal and hilar lymph nodes.   The aorta is normal in caliber.  Coronary artery calcifications are noted.  The esophagus is grossly normal.  No pericardial effusion.  Heart size is normal.  Examination of the lung parenchyma demonstrates chronic bronchitic changes and probable interstitial lung disease.  There are small pulmonary nodules.  No pulmonary mass or pleural effusion.  No bronchiectasis.  The visualized upper abdomen is grossly normal.  Intra and extrahepatic biliary dilatation is noted.  This could be due to prior cholecystectomy.  Recommend correlation liver function studies.  IMPRESSION:  1.  Mediastinal and hilar lymphadenopathy. 2.  Multiple small pulmonary nodules.  These are indeterminate.  No obvious lung mass.  Metastatic disease is possible.  PET CT may be helpful for further evaluation.   Original Report Authenticated By: Marijo Sanes, M.D.     Scheduled Meds:    . aspirin EC  325 mg Oral Daily  . enoxaparin (LOVENOX) injection  30 mg Subcutaneous Q24H  . feeding supplement  1 Container Oral TID BM  . folic acid-pyridoxine-cyancobalamin  1 tablet Oral Daily  . levothyroxine  50 mcg Oral QAC breakfast  . multivitamin  with minerals  1 tablet Oral q morning - 10a  . pantoprazole  40 mg Oral Q1200  . polyethylene glycol  68 g Oral Once  . senna  1 tablet Oral BID  . simvastatin  20 mg Oral QPM  . sodium chloride  3 mL Intravenous Q12H   Continuous Infusions:    . sodium chloride 150 mL/hr at 05/26/12 5093    Active Problems:  Hypercalcemia  Dehydration  ARF (acute renal failure)  CKD (chronic kidney disease) stage 2, GFR 60-89 ml/min  Osteoporosis  HTN (hypertension)  Osteoarthritis  Raynaud disease  Hypothyroidism  Mediastinal lymphadenopathy  Constipation      Meghan Wise  [facility] Pager [phone]. If 8PM-8AM, please contact night-coverage at [email or web address], password [email or web address] 05/26/2012, 7:43 AM  LOS: 2 days

## 2012-05-26 NOTE — Consult Note (Signed)
PULMONARY  / CRITICAL CARE MEDICINE  Name: Meghan Wise MRN: [id number] DOB: 27-Apr-1935    ADMISSION DATE:  05/24/2012 CONSULTATION DATE: 05/26/12  REFERRING MD :  [facility]/ [facility]  CHIEF COMPLAINT:  weakness  BRIEF PATIENT DESCRIPTION: 75 yowf remote smoker with sev years of fatigue much worse x 2 months pta on 1/31 with hypercalcemia and renal failure on ace and hctz and tums and vit d.  PCCM service asked to see 2/2 because w/u revealed med adenopathy.  SIGNIFICANT EVENTS / STUDIES:   CT Chest 05/25/11 1. Mediastinal and hilar lymphadenopathy.  2. Multiple small pulmonary nodules. These are indeterminate. No  obvious lung mass. Metastatic disease is possible. PET CT may be  helpful for further evaluation.      HISTORY OF PRESENT ILLNESS:  Pt followed by Dr Johnette Abraham griffin s/p smoking cessation 30 years pta with HBP and raynaud's with chronic noct gerd so takes tums almost every night and gives h/o progressive decline x sev years assoc with poor appetite and wt loss and variable constipation.  Then worse x 2 months overall and sob x one week pta assoc with dry cough.   Sleeping ok without nocturnal  or early am exacerbation  of respiratory  c/o's or need for noct saba. Also denies any obvious fluctuation of symptoms with weather or environmental changes or other aggravating or alleviating factors except as outlined above   ROS  The following are not active complaints unless bolded sore throat, dysphagia, dental problems, itching, sneezing,  nasal congestion or excess/ purulent secretions, ear ache,   fever, chills, sweats, unintended wt loss, pleuritic or exertional cp, hemoptysis,  orthopnea pnd or leg swelling, presyncope, palpitations, heartburn, abdominal pain, anorexia, nausea, vomiting, diarrhea  or change in bowel or urinary habits, change in stools or urine, dysuria,hematuria,  rash, arthralgias, visual complaints, headache, numbness weakness or ataxia or problems with  walking or coordination,  change in mood/affect or memory.      PAST MEDICAL HISTORY :  Past Medical History  Diagnosis Date  . Osteoporosis   . HTN (hypertension)   . Osteoarthritis   . Raynaud disease   . Hypothyroidism    Past Surgical History  Procedure Date  . Cholecystectomy, laparoscopic    Prior to Admission medications   Medication Sig Start Date End Date Taking? Authorizing Provider  aspirin EC 325 MG tablet Take 325 mg by mouth daily.   Yes Historical Provider, MD  folic acid-pyridoxine-cyancobalamin (FOLTX) 2.5-25-2 MG TABS Take 1 tablet by mouth daily.   Yes Historical Provider, MD  levothyroxine (SYNTHROID, LEVOTHROID) 50 MCG tablet Take 50 mcg by mouth every morning.   Yes Historical Provider, MD  MELATONIN PO Take 1 tablet by mouth at bedtime as needed. For sleep   Yes Historical Provider, MD  Multiple Vitamin (MULTIVITAMIN WITH MINERALS) TABS Take 1 tablet by mouth every morning.    Yes Historical Provider, MD  naproxen sodium (ANAPROX) 220 MG tablet Take 220 mg by mouth 2 (two) times daily with a meal. For pain   Yes Historical Provider, MD  simvastatin (ZOCOR) 20 MG tablet Take 20 mg by mouth every evening.   Yes Historical Provider, MD   Allergies  Allergen Reactions  . Ace Inhibitors Cough  . Codeine Nausea Only  . Sulfa Antibiotics Hives    FAMILY HISTORY:  Family History  Problem Relation Age of Onset  . Cancer Brother    SOCIAL HISTORY:  reports that she has quit smoking. Her smoking use  included Cigarettes. She quit after 60 years of use. She has never used smokeless tobacco. She reports that she does not drink alcohol or use illicit drugs.     SUBJECTIVE:  Some better since admit   VITAL SIGNS: Temp:  [98.1 F (36.7 C)-98.2 F (36.8 C)] 98.1 F (36.7 C) (02/02 0641) Pulse Rate:  [58-65] 65  (02/02 0641) Resp:  [16-22] 20  (02/02 0641) BP: (139-168)/(55-74) 144/58 mmHg (02/02 0650) SpO2:  [94 %-96 %] 94 % (02/02 0641) FIO2  2lpm     INTAKE / OUTPUT: Intake/Output      02/01 0701 - 02/02 0700 02/02 0701 - 02/03 0700   P.O. 600 400   Total Intake(mL/kg) 600 (15) 400 (10)   Urine (mL/kg/hr) 1125 (1.2)    Stool 2    Total Output 1127    Net -527 +400        Urine Occurrence 7 x 2 x   Stool Occurrence 6 x 1 x     PHYSICAL EXAMINATION: General:  Frail elderly wf > stated age Wt Readings from Last 3 Encounters:  05/25/12 88 lb 3.2 oz (40.007 kg)    HEENT: nl dentition, turbinates, and orophanx. Nl external ear canals without cough reflex   NECK :  without JVD/Nodes/TM/ nl carotid upstrokes bilaterally   LUNGS: no acc muscle use, clear to A and P bilaterally without cough on insp or exp maneuvers   CV:  RRR  no s3 or murmur or increase in P2, no edema   ABD:  soft and nontender with nl excursion in the supine position. No bruits or organomegaly, bowel sounds nl  MS:  Prominent raynaud's with cool blue fingers but o/w without deformities, calf tenderness, cyanosis or clubbing  SKIN: warm and dry without lesions    NEURO:  alert, approp, no deficits    LABS:  Lab 05/26/12 0917 05/26/12 0550 05/25/12 0500 05/24/12 1153  HGB -- 10.1* -- --  WBC -- 6.4 -- --  PLT -- 189 -- --  NA -- 143 139 138  K -- 3.2* 3.4* --  CL -- 112 103 98  CO2 -- _0 GLUCOSE -- 74 86 87  BUN -- 36* 49* 52*  CREATININE -- 2.61* 3.35* 3.39*  CALCIUM -- 11.6* 14.0* >15.0*  MG -- -- -- --  PHOS -- -- -- --  AST 29 -- -- --  ALT 18 -- -- --  ALKPHOS 54 -- -- --  BILITOT 0.5 -- -- --  PROT 6.2 -- -- --  ALBUMIN 2.9* -- -- --  APTT -- -- -- --  INR -- -- -- --  LATICACIDVEN -- -- -- --  TROPONINI -- -- -- --  PROCALCITON -- -- -- --  PROBNP -- -- -- --  O2SATVEN -- -- -- --  PHART -- -- -- --  PCO2ART -- -- -- --  PO2ART -- -- -- --   No results found for this basename: GLUCAP:5 in the last 168 hours     ASSESSMENT / PLAN:   1) Severe hypercalcemia while on ace, hctz, calcium and tums complicated by  renal failure - improving since admit    2) Sob and cough probably related to #1 and ACEI > improving s specific rx   3) Asympt hilar and med adenopathy may well be longstanding as her hila look a bit prominent esp on lateral views dating back several year.  Unless there is evidence of new "macroscopic" adenopathy or  mass I would favor conservative f/u for now and maybe outpt PET at some point if there is a clinical concern for unexplained symptoms.  I would be happy to see her if the office first to sort this out but don't believe further inpt pulmonary eval is needed at this point    Christinia Gully, MD Pulmonary and [place] [phone] After 5:30 PM or weekends, call [phone]

## 2012-05-26 NOTE — Progress Notes (Signed)
Upon rounding pt awake and sitting on side of bed with nursing student. Pt complaining again of feeling short of breath. Lungs with faint crackles in lower lobes. O2 sat 82% on 2l [place]. o2 increased to 4l [place]. Pt not in immediate distress but does appear anxious. Dr. Marye Round notified, orders received for lasix and given IV. PCXR done at bedside and awaiting return of results. After approximately 30 minutes, pts sats up to 96% on 4l [place]. Voiding at bedside. Will cont to monitor and decrease oxygen needs as tolerated. Husband at bedside

## 2012-05-26 NOTE — Progress Notes (Signed)
Another order received for 40 of iv lasix, bp 204/69

## 2012-05-26 NOTE — Progress Notes (Signed)
Pt got up to bsc to urinate with my help. When we got back to bed, she complained of feeling a little anxious and short of breath. No distress noted. Lungs clear, o2 sats 96% on 2 l [place]. PRN anxiety med giving and when I rechecked pt 20 minutes later and she was asleep. Husband at bedside will cont to monitor

## 2012-05-26 NOTE — Progress Notes (Signed)
Pt c/o dyspnea at rest times two. MD on call notified. 2L of O2 via [place] were applied. Pt's O2 sat was 96% on 2L. Respirations were 22. Slow deep breaths were unsuccessful. Ativan ordered. Will continue to monitor the pt. [place] Brunette

## 2012-05-27 ENCOUNTER — Inpatient Hospital Stay (HOSPITAL_COMMUNITY): Payer: Medicare Other

## 2012-05-27 DIAGNOSIS — J811 Chronic pulmonary edema: Secondary | ICD-10-CM | POA: Diagnosis not present

## 2012-05-27 LAB — BASIC METABOLIC PANEL
BUN: 33 mg/dL — ABNORMAL HIGH (ref 6–23)
CO2: 26 mEq/L (ref 19–32)
Calcium: 12 mg/dL — ABNORMAL HIGH (ref 8.4–10.5)
Glucose, Bld: 93 mg/dL (ref 70–99)
Sodium: 145 mEq/L (ref 135–145)

## 2012-05-27 MED ORDER — MAGNESIUM SULFATE 40 MG/ML IJ SOLN
4.0000 g | Freq: Once | INTRAMUSCULAR | Status: AC
  Administered 2012-05-27: 4 g via INTRAVENOUS
  Filled 2012-05-27 (×2): qty 100

## 2012-05-27 MED ORDER — SENNA 8.6 MG PO TABS
1.0000 | ORAL_TABLET | Freq: Every evening | ORAL | Status: DC | PRN
  Filled 2012-05-27: qty 1

## 2012-05-27 MED ORDER — SODIUM CHLORIDE 0.9 % IV SOLN
60.0000 mg | Freq: Once | INTRAVENOUS | Status: AC
  Administered 2012-05-27: 60 mg via INTRAVENOUS
  Filled 2012-05-27: qty 20

## 2012-05-27 MED ORDER — POTASSIUM CHLORIDE CRYS ER 20 MEQ PO TBCR: 40.0000 meq | EXTENDED_RELEASE_TABLET | Freq: Once | ORAL | Status: DC

## 2012-05-27 MED ORDER — AMLODIPINE BESYLATE 10 MG PO TABS
10.0000 mg | ORAL_TABLET | Freq: Every day | ORAL | Status: DC
  Administered 2012-05-27 – 2012-05-28 (×2): 10 mg via ORAL
  Filled 2012-05-27 (×2): qty 1

## 2012-05-27 MED ORDER — POTASSIUM CHLORIDE CRYS ER 20 MEQ PO TBCR
40.0000 meq | EXTENDED_RELEASE_TABLET | Freq: Once | ORAL | Status: AC
  Administered 2012-05-27: 40 meq via ORAL
  Filled 2012-05-27: qty 2

## 2012-05-27 NOTE — Progress Notes (Signed)
PULMONARY  / CRITICAL CARE MEDICINE  Name: Meghan Wise MRN: [id number] DOB: 31-Mar-1936    ADMISSION DATE:  05/24/2012 CONSULTATION DATE: 05/26/12  REFERRING MD :  [facility]/ [facility]  CHIEF COMPLAINT:  weakness  BRIEF PATIENT DESCRIPTION: 39 yowf remote smoker with sev years of fatigue much worse x 2 months pta on 1/31 with hypercalcemia and renal failure on ace and hctz and tums and vit d.  PCCM service asked to see 2/2 because w/u revealed med adenopathy.  SIGNIFICANT EVENTS / STUDIES:   CT Chest 05/25/11 1. Mediastinal and hilar lymphadenopathy.  2. Multiple small pulmonary nodules. These are indeterminate. No  obvious lung mass. Metastatic disease is possible. PET CT may be  helpful for further evaluation.  HISTORY OF PRESENT ILLNESS:  Pt followed by Dr Johnette Abraham griffin s/p smoking cessation 30 years pta with HBP and raynaud's with chronic noct gerd so takes tums almost every night and gives h/o progressive decline x sev years assoc with poor appetite and wt loss and variable constipation.  Then worse x 2 months overall and sob x one week pta assoc with dry cough.   Sleeping ok without nocturnal  or early am exacerbation  of respiratory  c/o's or need for noct saba. Also denies any obvious fluctuation of symptoms with weather or environmental changes or other aggravating or alleviating factors except as outlined above   ROS  The following are not active complaints unless bolded sore throat, dysphagia, dental problems, itching, sneezing,  nasal congestion or excess/ purulent secretions, ear ache,   fever, chills, sweats, unintended wt loss, pleuritic or exertional cp, hemoptysis,  orthopnea pnd or leg swelling, presyncope, palpitations, heartburn, abdominal pain, anorexia, nausea, vomiting, diarrhea  or change in bowel or urinary habits, change in stools or urine, dysuria,hematuria,  rash, arthralgias, visual complaints, headache, numbness weakness or ataxia or problems with walking or  coordination,  change in mood/affect or memory.     SUBJECTIVE:  No clinical changes  VITAL SIGNS: Temp:  [97.8 F (36.6 C)-98.1 F (36.7 C)] 98.1 F (36.7 C) (02/03 2202) Pulse Rate:  [51-72] 72  (02/03 0613) Resp:  [18-20] 18  (02/03 0613) BP: (117-196)/(46-76) 117/46 mmHg (02/03 0613) SpO2:  [94 %-96 %] 94 % (02/03 0700) Weight:  [41 kg (90 lb 6.2 oz)] 41 kg (90 lb 6.2 oz) (02/03 0613) FIO2  2lpm    INTAKE / OUTPUT: Intake/Output      02/02 0701 - 02/03 0700 02/03 0701 - 02/04 0700   P.O. 1240    Total Intake(mL/kg) 1240 (30.2)    Urine (mL/kg/hr) 4100 (4.2)    Stool 0    Total Output 4100    Net -2860         Urine Occurrence 4 x    Stool Occurrence 2 x      PHYSICAL EXAMINATION: General:  Frail elderly wf > stated age Wt Readings from Last 3 Encounters:  05/27/12 41 kg (90 lb 6.2 oz)    HEENT: nl dentition, turbinates, and orophanx. Nl external ear canals without cough reflex   NECK :  without JVD/Nodes/TM/ nl carotid upstrokes bilaterally   LUNGS: no acc muscle use, clear to A and P bilaterally without cough on insp or exp maneuvers   CV:  RRR  no s3 or murmur or increase in P2, no edema   ABD:  soft and nontender with nl excursion in the supine position. No bruits or organomegaly, bowel sounds nl  MS:  Prominent raynaud's with  cool blue fingers but o/w without deformities, calf tenderness, cyanosis or clubbing  SKIN: warm and dry without lesions    NEURO:  alert, approp, no deficits    LABS:  Lab 05/27/12 0650 05/26/12 0917 05/26/12 0550 05/25/12 0500  HGB -- -- 10.1* --  WBC -- -- 6.4 --  PLT -- -- 189 --  NA 145 -- 143 139  K 3.2* -- 3.2* --  CL 106 -- 112 103  CO2 26 -- 21 29  GLUCOSE 93 -- 74 86  BUN 33* -- 36* 49*  CREATININE 2.40* -- 2.61* 3.35*  CALCIUM 12.0* -- 11.6* 14.0*  MG -- -- -- --  PHOS -- -- -- --  AST -- 29 -- --  ALT -- 18 -- --  ALKPHOS -- 54 -- --  BILITOT -- 0.5 -- --  PROT -- 6.2 -- --  ALBUMIN -- 2.9* -- --   APTT -- -- -- --  INR -- -- -- --  LATICACIDVEN -- -- -- --  TROPONINI -- -- -- --  PROCALCITON -- -- -- --  PROBNP -- -- -- --  O2SATVEN -- -- -- --  PHART -- -- -- --  PCO2ART -- -- -- --  PO2ART -- -- -- --   No results found for this basename: GLUCAP:5 in the last 168 hours     ASSESSMENT / PLAN:   1) Severe hypercalcemia while on ace, hctz, calcium and tums complicated by renal failure; concerning for possible MM/bony disease, also possible sarcoidosis or lymphoma as detailed in Dr Anette Guarneri notes.  - await SPEP, Vit D studies; these results may make either bone scan, PET scan or nodal bx the logical next steps - I reviewed the CT scan chest, if we do decide to move to biopsy, the mediastinal LAD is inferior to carina, may be difficult to reach by FOB with EBUS. I don't think they could be reached by meadiastinoscopy either. Best approach may be EUS with needle bx - could discuss with DR Ardis Hughs in Gastroenterology if we decide to go this direction.   2) Sob and cough probably related to #1 and ACEI > improving s specific rx   Baltazar Apo, MD, PhD 05/27/2012, 10:42 AM [place] [facility] [phone] or if no answer [phone]

## 2012-05-27 NOTE — Clinical Documentation Improvement (Signed)
GENERIC DOCUMENTATION CLARIFICATION QUERY  THIS DOCUMENT IS NOT A PERMANENT PART OF THE MEDICAL RECORD  TO RESPOND TO THE THIS QUERY, FOLLOW THE INSTRUCTIONS BELOW:  1. If needed, update documentation for the patient's encounter via the notes activity.  2. Access this query again and click edit on the [employer] [place] [place].  3. After updating, or not, click F2 to complete all highlighted (required) fields concerning your review. Select "additional documentation in the medical record" OR "no additional documentation provided".  4. Click Sign note button.  5. The deficiency will fall out of your In Basket *Please let us know if you are not able to complete this workflow by phone or e-mail (listed below).  Please update your documentation within the medical record to reflect your response to this query.                                                                                        05/27/12   Dear Dr. Marye Round   / Associates,  In a better effort to capture your patient's severity of illness/SOI, risk of mortality/ROM, reflect appropriate length of stay and utilization of resources, a review of the patient medical record has revealed the following indicators.   PLEASE CLARIFY TERM "PULMONARY EDEMA" TO BETTER ILLUSTRATE PATIENT'S SEVERITY OF ILLNESS AND RISK OF MORTALITY.  THANK YOU.  Possible Clinical Conditions? - Acute pulmonary edema resolved - Other Condition (please specify)    Supporting Information: - Signs & Symptoms: per Note: "Pulmonary edema - 05/26/12 - probably from iv fluids"  - Treatment: Lasix IV x2 doses 2/2   You may use possible, probable, or suspect with inpatient documentation. possible, probable, suspected diagnoses MUST be documented at the time of discharge  Reviewed: additional documentation in the medical record  Thank You,  Ezekiel Ina RN BSN Clinical Documentation Specialist: Tele: [phone]  Health Information Management [place]

## 2012-05-27 NOTE — Clinical Documentation Improvement (Signed)
GENERIC DOCUMENTATION CLARIFICATION QUERY  THIS DOCUMENT IS NOT A PERMANENT PART OF THE MEDICAL RECORD  TO RESPOND TO THE THIS QUERY, FOLLOW THE INSTRUCTIONS BELOW:  1. If needed, update documentation for the patient's encounter via the notes activity.  2. Access this query again and click edit on the [facility] [employer].  3. After updating, or not, click F2 to complete all highlighted (required) fields concerning your review. Select "additional documentation in the medical record" OR "no additional documentation provided".  4. Click Sign note button.  5. The deficiency will fall out of your In Basket *Please let us know if you are not able to complete this workflow by phone or e-mail (listed below).  Please update your documentation within the medical record to reflect your response to this query.                                                                                        05/27/12   Dear Dr. Marye Round   / [facility],  In a better effort to capture your patient's severity of illness/SOI, risk of mortality/ROM, reflect appropriate length of stay and utilization of resources, a review of the patient medical record has revealed the following indicators.   PLEASE ADDRESS RESULTS OF NUTRITION ASSESSMENT OF 2/1 IN NOTES AND DC SUMMARY. THANK YOU.  Possible Clinical Conditions? - Severe malnutrtion -Other Condition (please specify)   Supporting Information: - Risk Factors: severe dehydration, ARF, mediastinal adenopathy, pulm nodules - Signs & Symptoms: Per Dietician evaluation on 2/1: "Pt meets criteria for SEVERE MALNUTRITION in the context of chronic illness as evidenced by 27% weight loss x 1 year, poor intake estimated to be < 75% of her estimated needs in > 1 month." - Treatment: Nutrition recommendations, malignancy workup in progress   You may use possible, probable, or suspect with inpatient documentation. possible, probable, suspected diagnoses MUST be documented at the time of  discharge  Reviewed: additional documentation in the medical record  Thank You,  Ezekiel Ina RN BSN Clinical Documentation Specialist: Tele:  [place]

## 2012-05-27 NOTE — Progress Notes (Addendum)
[facility] PROGRESS NOTE  Meghan Wise:[id number] DOB: Feb 17, 1936 DOA: 05/24/2012 PCP: Osborne Casco, MD  Assessment/Plan: 1. Severe symptomatic hypercalcemia - multifactorial. Patient on oral calcium and HCTZ prior to admission. I am also suspecting sarcoidosis or lymphoma. ACE level elevated. CT scan of the chest with pulmonary nodules and mediastinal adenopathy. CT scan abdomen and pelvis negative for major intraabdominal malignancy   or gynecological malignancy. The patient was severely volume depleted on admission and she was started on IV normal saline at 150 cc an hour.  By February 1 the patient is still deemed to be volume depleted - and we did continue IV normal saline. Calcium level has decreased to 14 on 2/1 and further decreased to 11.6 by 2/2. By 2/3 as we were unable to continue iv fluids the calcium level started rising again. Pamidronate 60 mg iv was given on 05/27/12.  Hyperparathyroidism ruled out by a low PTH level done in the office . PTHrP , vitamin D and SPEP still pending  2. Mediastinal adenopathy - DDx - sarcoidosis, lymphoma, metastatic malignancy of unclear primary source. Doubt acute or chronic infectious process . Pulmonary consult 2/2 done by Dr. Melvyn Novas - he recommended outpatient f/u with PET CT. Dr. Lamonte Sakai with pulmonary reviewed the chest CT and he will follow along during current inpatient admission.  3. Acute kidney injury - most likely secondary to ACE inhibitor and dehydration driven by hypercalcemia. Adequate urinary output. No major hydronephrosis on imaging. Urinary sediment without red blood cells or protein making glomerulonephritis highly unlikely. Renal function improved with IV fluids. SPEP to r/o myeloma pending  4. Newly diagnosed hypothyroidism- started on  Synthroid 05/23/2012 .  5. Severe constipation-prescribed MiraLAX bowel prep February 1 - with great success.  6. Anemia - Hb dropped after hydration - suspect chronic.  Ferritin is low.  We checked  LDH, hapto, retic and they are wnl . B12 done as outpt and normal. Started oral iron therapy 05/27/12 7. Dilated CBD - with normal LFTs  - no GI symptoms - no further w/u  8. Long history of GERD - started PPI 05/26/12  9. Pulmonary edema - 05/26/12 - probably from iv fluids - 2 d echo pending. Patient responded well to 2 doses of iv Lasix on 05/26/12.   10. HTN - was on acei and diuretic prior to admission. Start Norvasc on 05/27/12  11. Severe protein caloric malnutrition - as proven by weight loss of 27% this year and low albumin - patient evaluated by dietitian and protein supplement were initiated .   Code Status: full Family Communication: husband  Disposition Plan: home   Consultants:  Pulm   Procedures:  CT chest   CT abdomen   HPI/Subjective: No dyspnea   Objective: Filed Vitals:   05/26/12 1830 05/26/12 2100 05/27/12 0613 05/27/12 0700  BP: 196/76 172/74 117/46   Pulse:  51 72   Temp:  98.1 F (36.7 C) 98.1 F (36.7 C)   TempSrc:  Oral Oral   Resp:  18 18   Height:      Weight:   41 kg (90 lb 6.2 oz)   SpO2:  96% 96% 94%    Intake/Output Summary (Last 24 hours) at 05/27/12 0729 Last data filed at 05/27/12 0400  Gross per 24 hour  Intake   1000 ml  Output   3800 ml  Net  -2800 ml   Filed Weights   05/24/12 1058 05/25/12 0601 05/27/12 0613  Weight: 48.081 kg (106 lb)  40.007 kg (88 lb 3.2 oz) 41 kg (90 lb 6.2 oz)    Exam:   General:  Alert and oriented x3  Cardiovascular: Regular rate and rhythm without murmurs rubs or gallops  Respiratory: Clear to auscultation bilaterally  Abdomen: Soft nontender bowel sounds present  Data Reviewed: Basic Metabolic Panel:  Lab 16/10/96 0550 05/25/12 0500 05/24/12 1153  NA 143 139 138  K 3.2* 3.4* 3.6  CL 112 103 98  CO2 _0 GLUCOSE 74 86 87  BUN 36* 49* 52*  CREATININE 2.61* 3.35* 3.39*  CALCIUM 11.6* 14.0* >15.0*  MG -- -- --  PHOS -- -- --   Liver Function Tests:  Lab 05/26/12 0917   AST 29  ALT 18  ALKPHOS 54  BILITOT 0.5  PROT 6.2  ALBUMIN 2.9*   No results found for this basename: LIPASE:5,AMYLASE:5 in the last 168 hours No results found for this basename: AMMONIA:5 in the last 168 hours CBC:  Lab 05/26/12 0550  WBC 6.4  NEUTROABS --  HGB 10.1*  HCT 28.9*  MCV 90.0  PLT 189   Cardiac Enzymes: No results found for this basename: CKTOTAL:5,CKMB:5,CKMBINDEX:5,TROPONINI:5 in the last 168 hours BNP (last 3 results) No results found for this basename: PROBNP:3 in the last 8760 hours CBG: No results found for this basename: GLUCAP:5 in the last 168 hours  No results found for this or any previous visit (from the past 240 hour(s)).   Studies: Ct Abdomen Pelvis Wo Contrast  05/25/2012  *RADIOLOGY REPORT*  Clinical Data: Mediastinal and hilar lymphadenopathy, pulmonary nodules, and weight loss.  Evaluate for carcinoma.  CT ABDOMEN AND PELVIS WITHOUT CONTRAST  Technique:  Multidetector CT imaging of the abdomen and pelvis was performed following the standard protocol without intravenous contrast.  Comparison: None  Findings: Prior cholecystectomy noted.  Biliary ductal dilatation is seen, which may be related to prior cholecystectomy.  No liver masses are identified on this noncontrast study.  Noncontrast images of the pancreas, spleen, adrenal glands, and kidneys are also unremarkable in appearance.  No evidence of hydronephrosis. No soft tissue masses or lymphadenopathy identified within the abdomen or pelvis.  Uterus and adnexal regions are unremarkable in appearance.  No evidence of inflammatory process or abnormal fluid collections.  No evidence of bowel wall thickening or dilatation.  Diverticulosis is noted, however there is no evidence of diverticulitis.  No suspicious bone lesions identified.  IMPRESSION:  1.  No mass or lymphadenopathy identified. 2.  Biliary ductal dilatation which may be related to prior cholecystectomy.  Recommend correlation with liver  function tests, and consider MRCP for further imaging evaluation if clinically warranted. 3.  Diverticulosis.  No radiographic evidence of diverticulitis.   Original Report Authenticated By: Earle Gell, M.D.    Dg Chest Port 1 View  05/26/2012  *RADIOLOGY REPORT*  Clinical Data: Dyspnea, former smoker  PORTABLE CHEST - 1 VIEW  Comparison: 05/23/2012; 10/05/2010; chest CT - 05/24/2012  Findings:  Grossly unchanged cardiac silhouette and mediastinal contours with nodularity of the bilateral pulmonary hila compatible with known mediastinal and hilar lymphadenopathy.  Pulmonary vascular is less distinct with cephalization of flow.  Worsening bibasilar heterogeneous opacities, possibly atelectasis.  Suspect trace bilateral effusions.  No pneumothorax.  Unchanged bones.  IMPRESSION: 1.  Findings suggestive of pulmonary edema, now with trace bilateral effusions. 2.  Grossly unchanged hilar lymphadenopathy as demonstrated on recent chest CT.   Original Report Authenticated By: Jake Seats, MD     Scheduled Meds:    .  aspirin EC  325 mg Oral Daily  . enoxaparin (LOVENOX) injection  30 mg Subcutaneous Q24H  . feeding supplement  1 Container Oral TID BM  . folic acid-pyridoxine-cyancobalamin  1 tablet Oral Daily  . iron polysaccharides  150 mg Oral Daily  . levothyroxine  50 mcg Oral QAC breakfast  . multivitamin with minerals  1 tablet Oral q morning - 10a  . pantoprazole  40 mg Oral Q1200  . polyethylene glycol  68 g Oral Once  . senna  1 tablet Oral BID  . simvastatin  20 mg Oral QPM  . sodium chloride  3 mL Intravenous Q12H   Continuous Infusions:    Active Problems:  Hypercalcemia  Dehydration  ARF (acute renal failure)  CKD (chronic kidney disease) stage 2, GFR 60-89 ml/min  Osteoporosis  HTN (hypertension)  Osteoarthritis  Raynaud disease  Hypothyroidism  Mediastinal lymphadenopathy  Constipation  Pulmonary edema      Lilu Mcglown  [facility] Pager [phone]. If 8PM-8AM,  please contact night-coverage at [email or web address], password [facility] 05/27/2012, 7:29 AM  LOS: 3 days

## 2012-05-27 NOTE — Progress Notes (Signed)
  Echocardiogram 2D Echocardiogram has been performed.  Meghan Wise 05/27/2012, 1:05 PM

## 2012-05-28 DIAGNOSIS — I509 Heart failure, unspecified: Secondary | ICD-10-CM

## 2012-05-28 DIAGNOSIS — I5033 Acute on chronic diastolic (congestive) heart failure: Secondary | ICD-10-CM

## 2012-05-28 DIAGNOSIS — I5032 Chronic diastolic (congestive) heart failure: Secondary | ICD-10-CM | POA: Diagnosis present

## 2012-05-28 LAB — BASIC METABOLIC PANEL
BUN: 31 mg/dL — ABNORMAL HIGH (ref 6–23)
CO2: 26 mEq/L (ref 19–32)
Calcium: 11.1 mg/dL — ABNORMAL HIGH (ref 8.4–10.5)
Glucose, Bld: 84 mg/dL (ref 70–99)
Sodium: 143 mEq/L (ref 135–145)

## 2012-05-28 LAB — PROTEIN ELECTROPHORESIS, SERUM
Albumin ELP: 51.6 % — ABNORMAL LOW (ref 55.8–66.1)
Alpha-2-Globulin: 15.3 % — ABNORMAL HIGH (ref 7.1–11.8)
Beta 2: 6.3 % (ref 3.2–6.5)
Beta Globulin: 6.8 % (ref 4.7–7.2)
M-Spike, %: NOT DETECTED g/dL
Total Protein ELP: 7.5 g/dL (ref 6.0–8.3)

## 2012-05-28 LAB — CBC
Hemoglobin: 12 g/dL (ref 12.0–15.0)
MCH: 30.5 pg (ref 26.0–34.0)
MCV: 88.6 fL (ref 78.0–100.0)
RBC: 3.94 MIL/uL (ref 3.87–5.11)

## 2012-05-28 LAB — VITAMIN D 1,25 DIHYDROXY: Vitamin D3 1, 25 (OH)2: 81 pg/mL

## 2012-05-28 MED ORDER — BOOST / RESOURCE BREEZE PO LIQD: 1.0000 | Freq: Three times a day (TID) | ORAL | Status: DC

## 2012-05-28 MED ORDER — POLYSACCHARIDE IRON COMPLEX 150 MG PO CAPS: 150.0000 mg | ORAL_CAPSULE | Freq: Every day | ORAL | Status: DC

## 2012-05-28 MED ORDER — AMLODIPINE BESYLATE 10 MG PO TABS: 10.0000 mg | ORAL_TABLET | Freq: Every day | ORAL | Status: DC

## 2012-05-28 MED ORDER — PREDNISONE 20 MG PO TABS: 20.0000 mg | ORAL_TABLET | Freq: Every day | ORAL | Status: DC

## 2012-05-28 MED ORDER — POTASSIUM CHLORIDE CRYS ER 20 MEQ PO TBCR
40.0000 meq | EXTENDED_RELEASE_TABLET | Freq: Once | ORAL | Status: AC
  Administered 2012-05-28: 40 meq via ORAL

## 2012-05-28 MED ORDER — PREDNISONE 20 MG PO TABS
20.0000 mg | ORAL_TABLET | Freq: Every day | ORAL | Status: DC
  Filled 2012-05-28: qty 1

## 2012-05-28 MED ORDER — POLYETHYLENE GLYCOL 3350 17 G PO PACK: 17.0000 g | PACK | Freq: Every day | ORAL | Status: DC | PRN

## 2012-05-28 MED ORDER — ALUM & MAG HYDROXIDE-SIMETH 200-200-20 MG/5ML PO SUSP: 15.0000 mL | ORAL | Status: DC | PRN

## 2012-05-28 MED ORDER — OMEPRAZOLE MAGNESIUM 20 MG PO TBEC: 20.0000 mg | DELAYED_RELEASE_TABLET | Freq: Every day | ORAL | Status: DC

## 2012-05-28 MED ORDER — HYDROCOD POLST-CHLORPHEN POLST 10-8 MG/5ML PO LQCR: 5.0000 mL | Freq: Two times a day (BID) | ORAL | Status: DC | PRN

## 2012-05-28 NOTE — Care Management Note (Signed)
RN CM made appointment for pt PET CT Feb. 11, 2014 at 9:15 for arrival time. Place on AVS. Thanks Bethena Roys, RN,BSN [phone].

## 2012-05-28 NOTE — Discharge Summary (Signed)
Physician Discharge Summary  Meghan Wise LKT:[id number] DOB: 06/13/1935 DOA: 05/24/2012  PCP: Osborne Casco, MD  Admit date: 05/24/2012 Discharge date: 05/28/2012  Time spent: 75 minutes  Recommendations for Outpatient Follow-up:  1. BMET in 2 days to assure renal function is stable and hypercalcemia continues to improve 2. PET CT as outpatient scheduled for 06/04/12 Discharge Diagnoses:  Severe Hypercalcemia with low PTH, high ACE , high vitamin D level - suggestive of granulomatous disease vs lymphoma   Dehydration - resolved   ARF (acute renal failure) - improved   CKD (chronic kidney disease) stage 2, GFR 60-89 ml/min  Osteoporosis  HTN (hypertension)  Osteoarthritis  Raynaud disease  Hypothyroidism  Mediastinal lymphadenopathy  Constipation  Pulmonary edema  Hypokalemia  Hypomagnesemia  Acute on chronic diastolic CHF (congestive heart failure), NYHA class 4  Hypervitaminosis d Severe protein caloric malnutrition   Discharge Condition: good  Diet recommendation: low salt   Filed Weights   05/25/12 0601 05/27/12 0613 05/28/12 0600  Weight: 40.007 kg (88 lb 3.2 oz) 41 kg (90 lb 6.2 oz) 41.2 kg (90 lb 13.3 oz)    History of present illness:  Meghan Wise is a 77 y.o. female without any significant past medical history was referred from primary care physician office for a 2 month course of progressive weakness, 20 pound weight loss, bone pains, constipation, failure to thrive. Labs done at the primary care physician office revealed severe hypercalcemia with low PTH. Patient has been taking 600 mg of calcium for osteoporosis. She also has been on HCTZ. She was also found to have a TSH of 9.4 and was started on Synthroid yesterday. Workup in the primary care physician office also revealed acute renal failure with a creatinine of 3. The patient has been on an ACE inhibitor until yesterday   Hospital Course:  1. Severe symptomatic hypercalcemia - multifactorial.  Patient on oral calcium and HCTZ prior to admission. I am also suspecting sarcoidosis or lymphoma. ACE level elevated. CT scan of the chest with pulmonary nodules and mediastinal adenopathy. CT scan abdomen and pelvis negative for major intraabdominal malignancy or gynecological malignancy. The patient was severely volume depleted on admission and she was started on IV normal saline at 150 cc an hour. By February 1 the patient is still deemed to be volume depleted - and we did continue IV normal saline. Calcium level has decreased to 14 on 2/1 and further decreased to 11.6 by 2/2. By 2/3 as we were unable to continue iv fluids the calcium level started rising again. Pamidronate 60 mg iv was given on 05/27/12. Calcium level decreased to 11.1 by 05/28/12 Hyperparathyroidism ruled out by a low PTH level done in the office . PTHrP still pending at time of discharge  vitamin D level returned elevated on 05/29/11 and we did start treatment with prednisone 20 mg daily for hypervitaminosis D induced hypercalcemia  SPEP returned wnl   2. Mediastinal adenopathy - DDx - sarcoidosis vs lymphoma vs  metastatic malignancy of unclear primary source. Doubt acute or chronic infectious process . Pulmonary consult 2/2 done by Dr. Melvyn Novas - he recommended outpatient f/u with PET CT. Dr. Lamonte Sakai with pulmonary reviewed the chest CT and he is available to follow the patient if PET CT does not show any other area that can be biopsied.   3.  Acute kidney injury - most likely secondary to ACE inhibitor and dehydration driven by hypercalcemia. Adequate urinary output. No major hydronephrosis on imaging. Urinary sediment without red  blood cells or protein making glomerulonephritis highly unlikely. Renal function improved with IV fluids. SPEP negative for  Myeloma  4.  Newly diagnosed hypothyroidism- started on Synthroid 05/23/2012 .  5.  Severe constipation-prescribed MiraLAX bowel prep February 1 - with great success.  6.  Anemia - Hb  dropped after hydration - suspect chronic. Ferritin is low. We checked LDH, hapto, retic and they are wnl . B12 done as outpt and normal. Started oral iron therapy 05/27/12. Hb improved by 05/28/12. No active bleeding noted.   7. Dilated CBD - with normal LFTs - no GI symptoms - no further w/u  8.  Long history of GERD - started PPI 05/26/12  9.  Pulmonary edema - 05/26/12 -  from iv fluids - 2 d echo confirmed presence of acute on chronic diastolic chf. . Patient responded well to 2 doses of iv Lasix on 05/26/12.  10.  HTN - was on acei and diuretic prior to admission. Started Norvasc on 05/27/12   11. Severe protein caloric malnutrition - as proven by weight loss of 27% this year and low albumin - patient evaluated by dietitian and protein supplement were initiated .   Procedures:  CT chest, abdomen and pelvis  2 decho     Consultations:  Pulmonary  Discharge Exam: Filed Vitals:   05/27/12 1427 05/27/12 2141 05/28/12 0600 05/28/12 1400  BP: 117/63 125/70 117/49 111/67  Pulse: 79 72 60 72  Temp: 98.4 F (36.9 C) 98.4 F (36.9 C) 98.8 F (37.1 C) 98.1 F (36.7 C)  TempSrc: Oral Oral  Oral  Resp: _0 Height:      Weight:   41.2 kg (90 lb 13.3 oz)   SpO2: 97% 91% 95% 93%    General: axox3 Cardiovascular: rrr Respiratory: ctab   Discharge Instructions  Discharge Orders    Future Appointments: Provider: Department: Dept Phone: Center:   06/04/2012 9:30 AM [facility] [phone] [place]     Future Orders Please Complete By Expires   Diet - low sodium heart healthy      Increase activity slowly          Medication List     As of 05/28/2012  3:06 PM    STOP taking these medications         naproxen sodium 220 MG tablet   Commonly known as: ANAPROX      TAKE these medications         alum & mag hydroxide-simeth 200-200-20 MG/5ML suspension   Commonly known as: MAALOX/MYLANTA   Take 15 mLs by mouth every 4  (four) hours as needed for indigestion.      amLODipine 10 MG tablet   Commonly known as: NORVASC   Take 1 tablet (10 mg total) by mouth daily.      aspirin EC 325 MG tablet   Take 325 mg by mouth daily.      chlorpheniramine-HYDROcodone 10-8 MG/5ML Lqcr   Commonly known as: TUSSIONEX   Take 5 mLs by mouth every 12 (twelve) hours as needed (cough).      feeding supplement Liqd   Take 1 Container by mouth 3 (three) times daily between meals.      folic acid-pyridoxine-cyancobalamin 2.5-25-2 MG Tabs   Commonly known as: FOLTX   Take 1 tablet by mouth daily.      iron polysaccharides 150 MG capsule   Commonly known as: NIFEREX   Take 1 capsule (150  mg total) by mouth daily.      levothyroxine 50 MCG tablet   Commonly known as: SYNTHROID, LEVOTHROID   Take 50 mcg by mouth every morning.      MELATONIN PO   Take 1 tablet by mouth at bedtime as needed. For sleep      multivitamin with minerals Tabs   Take 1 tablet by mouth every morning.      polyethylene glycol packet   Commonly known as: MIRALAX / GLYCOLAX   Take 17 g by mouth daily as needed.      predniSONE 20 MG tablet   Commonly known as: DELTASONE   Take 1 tablet (20 mg total) by mouth daily with breakfast.      simvastatin 20 MG tablet   Commonly known as: ZOCOR   Take 20 mg by mouth every evening.           Follow-up Information    On 06/04/2012 to follow up. (Arrival Time 9:15 Please no food 6 hours prior to appointment. Water only after midnight. on Feb. 10th.)    Contact information:   [facility] - Please report to the Radiology Area:  PET CT [phone]  for scheduling       Follow up with Osborne Casco, MD. On 05/30/2012. (at 11:40 AM)    Contact information:   [place], [place] [address] [phone]           The results of significant diagnostics from this hospitalization (including imaging, microbiology, ancillary and laboratory) are listed below  for reference.    Significant Diagnostic Studies: Ct Abdomen Pelvis Wo Contrast  05/25/2012  *RADIOLOGY REPORT*  Clinical Data: Mediastinal and hilar lymphadenopathy, pulmonary nodules, and weight loss.  Evaluate for carcinoma.  CT ABDOMEN AND PELVIS WITHOUT CONTRAST  Technique:  Multidetector CT imaging of the abdomen and pelvis was performed following the standard protocol without intravenous contrast.  Comparison: None  Findings: Prior cholecystectomy noted.  Biliary ductal dilatation is seen, which may be related to prior cholecystectomy.  No liver masses are identified on this noncontrast study.  Noncontrast images of the pancreas, spleen, adrenal glands, and kidneys are also unremarkable in appearance.  No evidence of hydronephrosis. No soft tissue masses or lymphadenopathy identified within the abdomen or pelvis.  Uterus and adnexal regions are unremarkable in appearance.  No evidence of inflammatory process or abnormal fluid collections.  No evidence of bowel wall thickening or dilatation.  Diverticulosis is noted, however there is no evidence of diverticulitis.  No suspicious bone lesions identified.  IMPRESSION:  1.  No mass or lymphadenopathy identified. 2.  Biliary ductal dilatation which may be related to prior cholecystectomy.  Recommend correlation with liver function tests, and consider MRCP for further imaging evaluation if clinically warranted. 3.  Diverticulosis.  No radiographic evidence of diverticulitis.   Original Report Authenticated By: Earle Gell, M.D.    Dg Chest 2 View  05/27/2012  **ADDENDUM** CREATED: 05/27/2012 08:08:08  **END ADDENDUM** SIGNED BY: Dalene Carrow.,  M.D.   05/27/2012  *RADIOLOGY REPORT*  Clinical Data: Dyspnea.  CHEST - 2 VIEW  Comparison: May 26, 2012.  Findings: Bilateral pulmonary edema noted on prior exam is significantly improved, although residual perihilar interstitial densities remain.  Cardiac silhouette appears normal. Bilateral hilar prominence  remains.  Minimal right pleural effusion is noted. Bony thorax is intact.  IMPRESSION: Significantly improved pulmonary edema compared to prior exam, although residual edema does remain.   Original Report  Authenticated By: Marijo Conception.,  M.D.    Ct Chest Wo Contrast  05/24/2012  *RADIOLOGY REPORT*  Clinical Data: Cough and weight loss.  CT CHEST WITHOUT CONTRAST  Technique:  Multidetector CT imaging of the chest was performed following the standard protocol without IV contrast.  Comparison: None  Findings: The chest wall is unremarkable.  No supraclavicular or axillary adenopathy.  Small nodes are noted.  The bony thorax is intact.  No destructive bone lesions or spinal canal compromise.  There are enlarged mediastinal and hilar lymph nodes.  The aorta is normal in caliber.  Coronary artery calcifications are noted.  The esophagus is grossly normal.  No pericardial effusion.  Heart size is normal.  Examination of the lung parenchyma demonstrates chronic bronchitic changes and probable interstitial lung disease.  There are small pulmonary nodules.  No pulmonary mass or pleural effusion.  No bronchiectasis.  The visualized upper abdomen is grossly normal.  Intra and extrahepatic biliary dilatation is noted.  This could be due to prior cholecystectomy.  Recommend correlation liver function studies.  IMPRESSION:  1.  Mediastinal and hilar lymphadenopathy. 2.  Multiple small pulmonary nodules.  These are indeterminate.  No obvious lung mass.  Metastatic disease is possible.  PET CT may be helpful for further evaluation.   Original Report Authenticated By: Marijo Sanes, M.D.    Dg Chest Port 1 View  05/26/2012  *RADIOLOGY REPORT*  Clinical Data: Dyspnea, former smoker  PORTABLE CHEST - 1 VIEW  Comparison: 05/23/2012; 10/05/2010; chest CT - 05/24/2012  Findings:  Grossly unchanged cardiac silhouette and mediastinal contours with nodularity of the bilateral pulmonary hila compatible with known mediastinal and hilar  lymphadenopathy.  Pulmonary vascular is less distinct with cephalization of flow.  Worsening bibasilar heterogeneous opacities, possibly atelectasis.  Suspect trace bilateral effusions.  No pneumothorax.  Unchanged bones.  IMPRESSION: 1.  Findings suggestive of pulmonary edema, now with trace bilateral effusions. 2.  Grossly unchanged hilar lymphadenopathy as demonstrated on recent chest CT.   Original Report Authenticated By: Jake Seats, MD     Microbiology: No results found for this or any previous visit (from the past 240 hour(s)).   Labs: Basic Metabolic Panel:  Lab 37/16/96 0905 05/28/12 0447 05/27/12 0915 05/27/12 0650 05/26/12 0550 05/25/12 0500 05/24/12 1153  NA -- 143 -- 145 143 139 138  K -- 3.4* -- 3.2* 3.2* 3.4* 3.6  CL -- 106 -- 106 112 103 98  CO2 -- 26 -- _0 GLUCOSE -- 84 -- 93 74 86 87  BUN -- 31* -- 33* 36* 49* 52*  CREATININE -- 2.36* -- 2.40* 2.61* 3.35* 3.39*  CALCIUM -- 11.1* -- 12.0* 11.6* 14.0* >15.0*  MG 2.2 -- 1.0* -- -- -- --  PHOS -- -- -- -- -- -- --   Liver Function Tests:  Lab 05/26/12 0917  AST 29  ALT 18  ALKPHOS 54  BILITOT 0.5  PROT 6.2  ALBUMIN 2.9*   No results found for this basename: LIPASE:5,AMYLASE:5 in the last 168 hours No results found for this basename: AMMONIA:5 in the last 168 hours CBC:  Lab 05/28/12 0447 05/26/12 0550  WBC 9.3 6.4  NEUTROABS -- --  HGB 12.0 10.1*  HCT 34.9* 28.9*  MCV 88.6 90.0  PLT 227 189   Cardiac Enzymes: No results found for this basename: CKTOTAL:5,CKMB:5,CKMBINDEX:5,TROPONINI:5 in the last 168 hours BNP: BNP (last 3 results) No results found for this basename: PROBNP:3 in the last 8760 hours  CBG: No results found for this basename: GLUCAP:5 in the last 168 hours     Signed:  Aubrey Blackard  [facility] 05/28/2012, 3:06 PM

## 2012-05-28 NOTE — Progress Notes (Signed)
Pt and pt husband provided with dc instructions and education. Verbalized understanding. Pt provided prescriptions for all new medications and has no questions. Able to teach back learnings. Rolling walker delivered to room. Pt aware that HHPT will be set up with advanced. IV removed with tip intact. Heart monitor cleaned and returned to front. Selinda Flavin, RN

## 2012-05-28 NOTE — Progress Notes (Signed)
PULMONARY  / CRITICAL CARE MEDICINE  Name: Meghan Wise MRN: [id number] DOB: 07-Apr-1936    ADMISSION DATE:  05/24/2012 CONSULTATION DATE: 05/26/12  REFERRING MD :  [facility]  CHIEF COMPLAINT:  weakness  BRIEF PATIENT DESCRIPTION: 26 yowf remote smoker with sev years of fatigue much worse x 2 months pta on 1/31 with hypercalcemia and renal failure on ace and hctz and tums and vit d.  PCCM service asked to see 2/2 because w/u revealed med adenopathy.  SIGNIFICANT EVENTS / STUDIES:   CT Chest 05/25/11 1. Mediastinal and hilar lymphadenopathy.  2. Multiple small pulmonary nodules. These are indeterminate. No  obvious lung mass. Metastatic disease is possible. PET CT may be  helpful for further evaluation.  HISTORY OF PRESENT ILLNESS:  Pt followed by Dr Johnette Abraham griffin s/p smoking cessation 30 years pta with HBP and raynaud's with chronic noct gerd so takes tums almost every night and gives h/o progressive decline x sev years assoc with poor appetite and wt loss and variable constipation.  Then worse x 2 months overall and sob x one week pta assoc with dry cough.   Sleeping ok without nocturnal  or early am exacerbation  of respiratory  c/o's or need for noct saba. Also denies any obvious fluctuation of symptoms with weather or environmental changes or other aggravating or alleviating factors except as outlined above   ROS  The following are not active complaints unless bolded sore throat, dysphagia, dental problems, itching, sneezing,  nasal congestion or excess/ purulent secretions, ear ache,   fever, chills, sweats, unintended wt loss, pleuritic or exertional cp, hemoptysis,  orthopnea pnd or leg swelling, presyncope, palpitations, heartburn, abdominal pain, anorexia, nausea, vomiting, diarrhea  or change in bowel or urinary habits, change in stools or urine, dysuria,hematuria,  rash, arthralgias, visual complaints, headache, numbness weakness or ataxia or problems with walking or  coordination,  change in mood/affect or memory.     SUBJECTIVE:  Pt feels much better. Note that her Ca 11.1, S Cr is 2.36, both slightly improved.   VITAL SIGNS: Temp:  [98.1 F (36.7 C)-98.8 F (37.1 C)] 98.1 F (36.7 C) (02/04 1400) Pulse Rate:  [60-72] 72  (02/04 1400) Resp:  [14-17] 16  (02/04 1400) BP: (111-125)/(49-70) 111/67 mmHg (02/04 1400) SpO2:  [91 %-95 %] 93 % (02/04 1400) Weight:  [41.2 kg (90 lb 13.3 oz)] 41.2 kg (90 lb 13.3 oz) (02/04 0600) FIO2  2lpm    INTAKE / OUTPUT: Intake/Output      02/03 0701 - 02/04 0700 02/04 0701 - 02/05 0700   P.O. 120 600   IV Piggyback 100    Total Intake(mL/kg) 220 (5.3) 600 (14.6)   Urine (mL/kg/hr) 300 (0.3) 350 (1)   Total Output 300 350   Net -80 +250        Stool Occurrence 1 x      PHYSICAL EXAMINATION: General:  Frail elderly wf > stated age Wt Readings from Last 3 Encounters:  05/28/12 41.2 kg (90 lb 13.3 oz)    HEENT: nl dentition, turbinates, and orophanx. Nl external ear canals without cough reflex   NECK :  without JVD/Nodes/TM/ nl carotid upstrokes bilaterally   LUNGS: no acc muscle use, clear to A and P bilaterally without cough on insp or exp maneuvers   CV:  RRR  no s3 or murmur or increase in P2, no edema   ABD:  soft and nontender with nl excursion in the supine position. No bruits or  organomegaly, bowel sounds nl  MS:  Prominent raynaud's with cool blue fingers but o/w without deformities, calf tenderness, cyanosis or clubbing  SKIN: warm and dry without lesions    NEURO:  alert, approp, no deficits    LABS:  Lab 05/28/12 0905 05/28/12 0447 05/27/12 0915 05/27/12 0650 05/26/12 0917 05/26/12 0550  HGB -- 12.0 -- -- -- 10.1*  WBC -- 9.3 -- -- -- 6.4  PLT -- 227 -- -- -- 189  NA -- 143 -- 145 -- 143  K -- 3.4* -- 3.2* -- --  CL -- 106 -- 106 -- 112  CO2 -- 26 -- 26 -- 21  GLUCOSE -- 84 -- 93 -- 74  BUN -- 31* -- 33* -- 36*  CREATININE -- 2.36* -- 2.40* -- 2.61*  CALCIUM -- 11.1* --  12.0* -- 11.6*  MG 2.2 -- 1.0* -- -- --  PHOS -- -- -- -- -- --  AST -- -- -- -- 29 --  ALT -- -- -- -- 18 --  ALKPHOS -- -- -- -- 54 --  BILITOT -- -- -- -- 0.5 --  PROT -- -- -- -- 6.2 --  ALBUMIN -- -- -- -- 2.9* --  APTT -- -- -- -- -- --  INR -- -- -- -- -- --  LATICACIDVEN -- -- -- -- -- --  TROPONINI -- -- -- -- -- --  PROCALCITON -- -- -- -- -- --  PROBNP -- -- -- -- -- --  O2SATVEN -- -- -- -- -- --  PHART -- -- -- -- -- --  PCO2ART -- -- -- -- -- --  PO2ART -- -- -- -- -- --   No results found for this basename: GLUCAP:5 in the last 168 hours     ASSESSMENT / PLAN:   1) Severe hypercalcemia while on ace, hctz, calcium and tums complicated by renal failure; scenario concerning for possible MM/bony disease, also possible sarcoidosis or lymphoma as detailed in Dr Anette Guarneri notes.  - await SPEP,  these results may make either bone scan the logical next step - note that PET scan is ordered outpt for 06/04/12 - I reviewed the CT scan chest, if we do decide to move to biopsy, the mediastinal LAD is inferior to carina, may be difficult to reach by FOB with EBUS. I don't think they could be reached by meadiastinoscopy either. Best approach may be EUS with needle bx - could discuss with DR Ardis Hughs in Gastroenterology if we decide to go this direction. PET scan may guide [place] here as to best location to bx - I would be happy to see her as an outpt to help coordinate.   2) Sob and cough probably related to #1 and ACEI > improving s specific rx  Baltazar Apo, MD, PhD 05/28/2012, 3:21 PM [facility] [phone] or if no answer [phone]

## 2012-05-28 NOTE — Evaluation (Signed)
Physical Therapy Evaluation Patient Details Name: Meghan Wise MRN: [id number] DOB: [birth year] Today's Date: 05/28/2012 Time: 7829-5621 PT Time Calculation (min): 31 min  PT Assessment / Plan / Recommendation Clinical Impression  pt admitted with a 2 month h/o progressive weakness, bone pains constipation and FTT  determined to have sever hypercalcemia and hypothyroidism.  On eval, pt is still generally weak with mild gait instability.  She could benefit from HHPT to improve function and safety.  Recommend use of RW in the short term.    PT Assessment  All further PT needs can be met in the next venue of care    Follow Up Recommendations  Home health PT;Supervision - Intermittent    Does the patient have the potential to tolerate intense rehabilitation      Barriers to Discharge        Equipment Recommendations  Rolling walker with 5" wheels (Juvenile RW)    Recommendations for Other Services     Frequency      Precautions / Restrictions Precautions Precautions: Fall Restrictions Weight Bearing Restrictions: No   Pertinent Vitals/Pain       Mobility  Bed Mobility Bed Mobility: Supine to Sit;Sitting - Scoot to Edge of Bed;Sit to Supine Supine to Sit: 6: Modified independent (Device/Increase time);HOB flat Sitting - Scoot to Edge of Bed: 6: Modified independent (Device/Increase time) Details for Bed Mobility Assistance: safe but effortful Transfers Transfers: Sit to Stand;Stand to Sit Sit to Stand: 5: Supervision;With upper extremity assist;From bed Stand to Sit: 5: Supervision;With upper extremity assist;To bed Details for Transfer Assistance: vc's for safe hand placement and safety in general with use of the RW Ambulation/Gait Ambulation/Gait Assistance: 4: Min guard;5: Supervision Ambulation Distance (Feet): 300 Feet Assistive device: Rolling walker Ambulation/Gait Assistance Details: initial instability as she go use to the RW, but then generally stable and  feeling more confident Gait Pattern: Step-through pattern;Decreased step length - right;Decreased step length - left;Decreased stride length Gait velocity: slowed Stairs: Yes Stairs Assistance: 4: Min guard Stair Management Technique: One rail Right;Step to pattern;Forwards Number of Stairs: 4  (discussed getting assist on the stairs due to no rails)    Exercises     PT Diagnosis: Generalized weakness  PT Problem List: Decreased strength;Decreased activity tolerance;Decreased balance;Decreased mobility;Decreased knowledge of use of DME PT Treatment Interventions:     PT Goals    Visit Information  Last PT Received On: 05/28/12 Assistance Needed: +1    Subjective Data  Subjective: I would feel much better if I had a RW to use. Patient Stated Goal: I would love to get home   Prior [place] Lives With: Spouse Available Help at Discharge: Family;Available 24 hours/day Type of Home: House Home Access: Stairs to enter [employer] of Steps: 2 Entrance Stairs-Rails: None Home Layout: One level Bathroom Shower/Tub: Multimedia programmer: Standard Home Adaptive Equipment: Environmental consultant - rolling Prior Function Level of Independence: Independent Able to Take Stairs?: Yes Driving: Yes Communication Communication: No difficulties    Cognition  Cognition Overall Cognitive Status: Appears within functional limits for tasks assessed/performed Arousal/Alertness: Awake/alert Orientation Level: Appears intact for tasks assessed Behavior During Session: [facility] for tasks performed    Extremity/Trunk Assessment Right Upper Extremity Assessment RUE ROM/Strength/Tone: Within functional levels Left Upper Extremity Assessment LUE ROM/Strength/Tone: Within functional levels Right Lower Extremity Assessment RLE ROM/Strength/Tone: Within functional levels Left Lower Extremity Assessment LLE ROM/Strength/Tone: Within functional levels (bil LE's mildly weak at  >=4/5) Trunk Assessment Trunk Assessment: Normal  Balance Balance Balance Assessed: No (steady in static/dyn sitting)  End of Session PT - End of Session Activity Tolerance: Patient tolerated treatment well Patient left: in bed;with call bell/phone within reach;with family/visitor present Nurse Communication: Mobility status  GP     Shequita Peplinski, Tessie Fass 05/28/2012, 3:14 PM 05/28/2012  Donnella Sham, PT [phone] (pager)

## 2012-06-03 ENCOUNTER — Telehealth: Payer: Self-pay | Admitting: Internal Medicine

## 2012-06-03 ENCOUNTER — Other Ambulatory Visit (HOSPITAL_COMMUNITY): Payer: Self-pay | Admitting: Internal Medicine

## 2012-06-03 DIAGNOSIS — R918 Other nonspecific abnormal finding of lung field: Secondary | ICD-10-CM

## 2012-06-04 ENCOUNTER — Encounter (HOSPITAL_COMMUNITY)
Admit: 2012-06-04 | Discharge: 2012-06-04 | Disposition: A | Discharge status: Home / Self Care | Payer: Medicare Other | Attending: Internal Medicine | Admitting: Internal Medicine

## 2012-06-04 DIAGNOSIS — R599 Enlarged lymph nodes, unspecified: Secondary | ICD-10-CM | POA: Insufficient documentation

## 2012-06-04 DIAGNOSIS — R918 Other nonspecific abnormal finding of lung field: Secondary | ICD-10-CM | POA: Insufficient documentation

## 2012-06-04 DIAGNOSIS — Z90710 Acquired absence of both cervix and uterus: Secondary | ICD-10-CM | POA: Insufficient documentation

## 2012-06-04 LAB — GLUCOSE, CAPILLARY: Glucose-Capillary: 87 mg/dL (ref 70–99)

## 2012-06-04 MED ORDER — FLUDEOXYGLUCOSE F - 18 (FDG) INJECTION
18.6000 | Freq: Once | INTRAVENOUS | Status: AC | PRN
  Administered 2012-06-04: 18.6 via INTRAVENOUS

## 2012-07-05 ENCOUNTER — Encounter: Payer: Self-pay | Admitting: Emergency Medicine

## 2012-07-08 ENCOUNTER — Encounter: Payer: Self-pay | Admitting: Emergency Medicine

## 2012-07-08 ENCOUNTER — Ambulatory Visit (INDEPENDENT_AMBULATORY_CARE_PROVIDER_SITE_OTHER): Payer: Medicare Other | Admitting: Emergency Medicine

## 2012-07-08 VITALS — BP 140/78 | Temp 97.0°F | Ht <= 58 in | Wt 109.6 lb

## 2012-07-08 DIAGNOSIS — R590 Localized enlarged lymph nodes: Secondary | ICD-10-CM

## 2012-07-08 DIAGNOSIS — R0602 Shortness of breath: Secondary | ICD-10-CM

## 2012-07-08 DIAGNOSIS — R0600 Dyspnea, unspecified: Secondary | ICD-10-CM

## 2012-07-08 DIAGNOSIS — J811 Chronic pulmonary edema: Secondary | ICD-10-CM

## 2012-07-08 DIAGNOSIS — R599 Enlarged lymph nodes, unspecified: Secondary | ICD-10-CM

## 2012-07-08 DIAGNOSIS — R0609 Other forms of dyspnea: Secondary | ICD-10-CM

## 2012-07-08 NOTE — Assessment & Plan Note (Signed)
Small nodes stable in size. Some are anterior mediastinum and another inferior to main carina in para-esophageal position. All would be impossible to bx by bronchoscopy. She also has some small pulm nodules and some per-portal nodes in the abdomen. PET scan is reassuring, makes lymphoma unlikely. Sarcoidosis still possible, consider also other graulomatous disease, fungal exposure, etc.  - will plan to repeat CT scan chest in August to assess for interval change.  - if she does need bx at some point, then may be best done by EUS, although transbronchial bx's are often diagnostic if this is pulm sarcoid.

## 2012-07-08 NOTE — Patient Instructions (Addendum)
We will perform full pulmonary function testing at your next office visit We will repeat your CT scan of the chest in August 2014 Please discuss with Dr Laurann Montana the duration of your prednisone treatment based on your calcium and vitamin D levels.  Follow with Dr Lamonte Sakai next available with full PFT

## 2012-07-08 NOTE — Progress Notes (Signed)
Subjective:    Patient ID: Meghan Wise, female    DOB: 08/15/1935, 77 y.o.   MRN: [id number]  HPI 77 yo woman former tobacco (45 pk-yrs), OA, hx raynauds + dry eyes (? [place]). She was  hosp for severe hypercalcemia (on HCTZ, with hyper Vit D, ? Sarcoidosis w some shotty LAD and pulm nodules on CT chest, and elevated ACE level). SPEP was normal. She was also found to be hypothyroid on that admission. She was d/c to home on Pred after pamidronate, synthroid. Her Ca level has normalized, being followed by Dr Laurann Montana. She follows up today regarding her CT scan chest and PET scan done 06/04/12. The PET was performed given the possibility of lymphoma. Her small nodes had some very subtle hypermetabolism, but was inconsistent with lymphoma.   Tells me that she has noticed an increase in exertional SOB since d/c to home from the hospital. Most recently this showed up when she was doing some yard work. Prior to this she only noticed some mild dyspnea when climbing hills. No real mucous or wheeze.    Review of Systems  Constitutional: Negative for fever and unexpected weight change.  HENT: Negative for ear pain, nosebleeds, congestion, sore throat, rhinorrhea, sneezing, trouble swallowing, dental problem, postnasal drip and sinus pressure.   Eyes: Negative for redness and itching.  Respiratory: Positive for shortness of breath. Negative for cough, chest tightness and wheezing.   Cardiovascular: Negative for palpitations and leg swelling.  Gastrointestinal: Negative for nausea and vomiting.  Genitourinary: Negative for dysuria.  Musculoskeletal: Negative for joint swelling.  Skin: Negative for rash.  Neurological: Negative for headaches.  Hematological: Does not bruise/bleed easily.  Psychiatric/Behavioral: Negative for dysphoric mood. The patient is not nervous/anxious.     Past Medical History  Diagnosis Date  . Osteoporosis   . HTN (hypertension)   . Osteoarthritis   . Raynaud disease    . Hypothyroidism   . Heart failure   . Kidney disease   . Hyperlipidemia      Family History  Problem Relation Age of Onset  . Cancer      husband--esophageal  . Heart disease Mother   . Heart disease Father   . Diabetes Son   . Stroke Daughter      History   Social History  . Marital Status: Married    Spouse Name: N/A    Number of Children: 37  . Years of Education: N/A   Occupational History  . Retired     Art therapist Asst.   Social History Main Topics  . Smoking status: Former Smoker -- 1.50 packs/day for 60 years    Types: Cigarettes    Quit date: 04/25/1987  . Smokeless tobacco: Never Used  . Alcohol Use: No  . Drug Use: No  . Sexually Active: Not on file   Other Topics Concern  . Not on file   Social History Narrative  . No narrative on file     Allergies  Allergen Reactions  . Ace Inhibitors Cough  . Codeine Nausea Only  . Losartan   . Sulfa Antibiotics Hives     Outpatient Prescriptions Prior to Visit  Medication Sig Dispense Refill  . alum & mag hydroxide-simeth (MAALOX/MYLANTA) 200-200-20 MG/5ML suspension Take 15 mLs by mouth every 4 (four) hours as needed for indigestion.  355 mL    . amLODipine (NORVASC) 10 MG tablet Take 1 tablet (10 mg total) by mouth daily.  30 tablet  0  . aspirin EC  325 MG tablet Take 325 mg by mouth daily.      . chlorpheniramine-HYDROcodone (TUSSIONEX) 10-8 MG/5ML LQCR Take 5 mLs by mouth every 12 (twelve) hours as needed (cough).  956 mL  0  . folic acid-pyridoxine-cyancobalamin (FOLTX) 2.5-25-2 MG TABS Take 1 tablet by mouth daily.      . iron polysaccharides (NIFEREX) 150 MG capsule Take 1 capsule (150 mg total) by mouth daily.  30 capsule  0  . levothyroxine (SYNTHROID, LEVOTHROID) 50 MCG tablet Take 50 mcg by mouth every morning.      Marland Kitchen MELATONIN PO Take 1 tablet by mouth at bedtime as needed. For sleep      . Multiple Vitamin (MULTIVITAMIN WITH MINERALS) TABS Take 1 tablet by mouth every morning.       Marland Kitchen  omeprazole (PRILOSEC OTC) 20 MG tablet Take 1 tablet (20 mg total) by mouth daily.      . polyethylene glycol (MIRALAX / GLYCOLAX) packet Take 17 g by mouth daily as needed.  14 each    . simvastatin (ZOCOR) 20 MG tablet Take 20 mg by mouth every evening.      . predniSONE (DELTASONE) 20 MG tablet Take 1 tablet (20 mg total) by mouth daily with breakfast.  14 tablet  0  . feeding supplement (RESOURCE BREEZE) LIQD Take 1 Container by mouth 3 (three) times daily between meals.      Marland Kitchen losartan-hydrochlorothiazide (HYZAAR) 100-25 MG per tablet Take 1 tablet by mouth daily.      . naproxen sodium (ANAPROX) 220 MG tablet Take 220 mg by mouth 2 (two) times daily with a meal.       No facility-administered medications prior to visit.      Objective:   Physical Exam Filed Vitals:   07/08/12 1513  BP: 140/78  Temp: 97 F (36.1 C)   Gen: Pleasant, well-nourished, in no distress,  normal affect  Lungs: No use of accessory muscles, clear without rales or rhonchi  Cardiovascular: RRR, heart sounds normal, no murmur or gallops, no peripheral edema  Musculoskeletal: No deformities, no cyanosis or clubbing  Neuro: alert, non focal  Skin: Warm, no lesions or rashes, fingers are cyanotic and cool    PET Scan  - 06/04/12 Comparison: CT thorax 05/24/2012  Findings:  Neck: No hypermetabolic lymph nodes in the neck.  Chest: There is mild metabolic activity associated with mildly  enlarged mediastinal lymph nodes. For example prevascular lymph  nodes measuring 10 mm short axis (image 58) with SUV max = 4.8.  Subcarinal lymph node measuring 12 mm (image 68) with SUV max =  4.6. The thyroid gland is normal.  Review of the lung windows demonstrates several small pulmonary  nodules which do not have metabolic activity but are below this  size limit for active PET characterization.  Abdomen/Pelvis: There is mild metabolic activity within the porta  hepatis likely related to ill-defined lymph nodes at  this level.  For example metabolic activity on image 110 with SUV max = 5.1.  There is no additional abnormal hypermetabolic nodes the abdomen  pelvis. No abnormal activity in the liver or spleen.  No abnormal activity within the pelvis. Post hysterectomy anatomy.  Skeleton: No focal hypermetabolic activity to suggest skeletal  metastasis.  IMPRESSION:  1. Mild metabolic activity associate with mildly enlarged  mediastinal and periportal lymph nodes. While this could represent  a low grade lymphoma or chronic lymphocytic leukemia findings more  likely represent benign granulomas disease or benign inflammatory  process.  2. Small pulmonary nodules are below the size limit for accurate  PET characterization. 3. Recommend follow-up CT thorax with  contrast if possible in 3 months to assess stability of the lymph  nodes.      Assessment & Plan:  Mediastinal lymphadenopathy Small nodes stable in size. Some are anterior mediastinum and another inferior to main carina in para-esophageal position. All would be impossible to bx by bronchoscopy. She also has some small pulm nodules and some per-portal nodes in the abdomen. PET scan is reassuring, makes lymphoma unlikely. Sarcoidosis still possible, consider also other graulomatous disease, fungal exposure, etc.  - will plan to repeat CT scan chest in August to assess for interval change.  - if she does need bx at some point, then may be best done by EUS, although transbronchial bx's are often diagnostic if this is pulm sarcoid.   Dyspnea on exertion Suspect related to her prior tobacco hx, but consider also her diastolic dysfxn, obstructive dz due to sarcoid (if this is her dx), effects of recurrent hypercalcemia.  - full PFT - will decide about starting BD's based on these results - continue the sarcoid workup if future CT scan or other findings support this.   Hypercalcemia Being followed by Dr Laurann Montana - asked pt to follow with her  regarding ca level, vit D, duration of the prednisone.

## 2012-07-08 NOTE — Assessment & Plan Note (Signed)
Being followed by Dr Laurann Montana - asked pt to follow with her regarding ca level, vit D, duration of the prednisone.

## 2012-07-08 NOTE — Assessment & Plan Note (Signed)
Suspect related to her prior tobacco hx, but consider also her diastolic dysfxn, obstructive dz due to sarcoid (if this is her dx), effects of recurrent hypercalcemia.  - full PFT - will decide about starting BD's based on these results - continue the sarcoid workup if future CT scan or other findings support this.

## 2012-07-10 ENCOUNTER — Other Ambulatory Visit: Payer: Self-pay | Admitting: Gastroenterology

## 2012-07-10 DIAGNOSIS — R634 Abnormal weight loss: Secondary | ICD-10-CM

## 2012-07-17 ENCOUNTER — Other Ambulatory Visit: Payer: Medicare Other

## 2012-07-25 ENCOUNTER — Ambulatory Visit
Admission: RE | Admit: 2012-07-25 | Discharge: 2012-07-25 | Disposition: A | Discharge status: Home / Self Care | Payer: Medicare Other | Source: Ambulatory Visit | Attending: Gastroenterology | Admitting: Gastroenterology

## 2012-07-25 DIAGNOSIS — R634 Abnormal weight loss: Secondary | ICD-10-CM

## 2012-08-12 ENCOUNTER — Encounter: Payer: Self-pay | Admitting: Emergency Medicine

## 2012-08-12 ENCOUNTER — Ambulatory Visit (INDEPENDENT_AMBULATORY_CARE_PROVIDER_SITE_OTHER): Payer: Medicare Other | Admitting: Emergency Medicine

## 2012-08-12 VITALS — BP 128/66 | HR 108 | Temp 96.9°F | Ht <= 58 in | Wt 112.0 lb

## 2012-08-12 DIAGNOSIS — R05 Cough: Secondary | ICD-10-CM

## 2012-08-12 DIAGNOSIS — R590 Localized enlarged lymph nodes: Secondary | ICD-10-CM

## 2012-08-12 DIAGNOSIS — R599 Enlarged lymph nodes, unspecified: Secondary | ICD-10-CM

## 2012-08-12 DIAGNOSIS — R0600 Dyspnea, unspecified: Secondary | ICD-10-CM

## 2012-08-12 DIAGNOSIS — R0609 Other forms of dyspnea: Secondary | ICD-10-CM

## 2012-08-12 DIAGNOSIS — R0602 Shortness of breath: Secondary | ICD-10-CM

## 2012-08-12 DIAGNOSIS — R059 Cough, unspecified: Secondary | ICD-10-CM

## 2012-08-12 LAB — PULMONARY FUNCTION TEST

## 2012-08-12 MED ORDER — OMEPRAZOLE 20 MG PO CPDR: 20.0000 mg | DELAYED_RELEASE_CAPSULE | Freq: Two times a day (BID) | ORAL | Status: DC

## 2012-08-12 NOTE — Progress Notes (Signed)
Subjective:    Patient ID: Meghan Wise, female    DOB: September 22, 1935, 77 y.o.   MRN: [id number]  HPI 77 yo woman former tobacco (45 pk-yrs), OA, hx raynauds + dry eyes (? Sjogrens). She was  hosp for severe hypercalcemia (on HCTZ, with hyper Vit D, ? Sarcoidosis w some shotty LAD and pulm nodules on CT chest, and elevated ACE level). SPEP was normal. She was also found to be hypothyroid on that admission. She was d/c to home on Pred after pamidronate, synthroid. Her Ca level has normalized, being followed by Dr Laurann Montana. She follows up today regarding her CT scan chest and PET scan done 06/04/12. The PET was performed given the possibility of lymphoma. Her small nodes had some very subtle hypermetabolism, but was inconsistent with lymphoma.   Tells me that she has noticed an increase in exertional SOB since d/c to home from the hospital. Most recently this showed up when she was doing some yard work. Prior to this she only noticed some mild dyspnea when climbing hills. No real mucous or wheeze.   ROV 08/12/12 -- f/u for dyspnea, LAD and pulm nodules on CT scan, hypercalcemia. She returns after PFT today >> normal AF but curve that suggests mild AFL. Decreased DLCO.  Her Ca and renal fxn are back to normal per pt (Dr Laurann Montana). She is having a lot of cough - more than before, usually dry. She has also noticed low grade fevers that have been off/on since beginning April. This is in addition to her night sweats. She c/o severe GERD, uses maalox frequently. Used to be on PPI but not currently. She is no longer able to do her usual activities - can't garden, etc. Breathing, weakness, fatigue are the sx.    Review of Systems  Constitutional: Negative for fever and unexpected weight change.  HENT: Negative for ear pain, nosebleeds, congestion, sore throat, rhinorrhea, sneezing, trouble swallowing, dental problem, postnasal drip and sinus pressure.   Eyes: Negative for redness and itching.  Respiratory:  Positive for shortness of breath. Negative for cough, chest tightness and wheezing.   Cardiovascular: Negative for palpitations and leg swelling.  Gastrointestinal: Negative for nausea and vomiting.  Genitourinary: Negative for dysuria.  Musculoskeletal: Negative for joint swelling.  Skin: Negative for rash.  Neurological: Negative for headaches.  Hematological: Does not bruise/bleed easily.  Psychiatric/Behavioral: Negative for dysphoric mood. The patient is not nervous/anxious.       Objective:   Physical Exam Filed Vitals:   08/12/12 1327  BP: 128/66  Pulse: 108  Temp: 96.9 F (36.1 C)   Gen: Pleasant, well-nourished, in no distress,  normal affect  Lungs: No use of accessory muscles, clear without rales or rhonchi  Cardiovascular: RRR, heart sounds normal, no murmur or gallops, no peripheral edema  Musculoskeletal: No deformities, no cyanosis or clubbing  Neuro: alert, non focal  Skin: Warm, no lesions or rashes, fingers are cyanotic and cool    PET Scan  - 06/04/12 Comparison: CT thorax 05/24/2012  Findings:  Neck: No hypermetabolic lymph nodes in the neck.  Chest: There is mild metabolic activity associated with mildly  enlarged mediastinal lymph nodes. For example prevascular lymph  nodes measuring 10 mm short axis (image 58) with SUV max = 4.8.  Subcarinal lymph node measuring 12 mm (image 68) with SUV max =  4.6. The thyroid gland is normal.  Review of the lung windows demonstrates several small pulmonary  nodules which do not have metabolic activity but are  below this  size limit for active PET characterization.  Abdomen/Pelvis: There is mild metabolic activity within the porta  hepatis likely related to ill-defined lymph nodes at this level.  For example metabolic activity on image 110 with SUV max = 5.1.  There is no additional abnormal hypermetabolic nodes the abdomen  pelvis. No abnormal activity in the liver or spleen.  No abnormal activity within the  pelvis. Post hysterectomy anatomy.  Skeleton: No focal hypermetabolic activity to suggest skeletal  metastasis.  IMPRESSION:  1. Mild metabolic activity associate with mildly enlarged  mediastinal and periportal lymph nodes. While this could represent  a low grade lymphoma or chronic lymphocytic leukemia findings more  likely represent benign granulomas disease or benign inflammatory  process.  2. Small pulmonary nodules are below the size limit for accurate  PET characterization. 3. Recommend follow-up CT thorax with  contrast if possible in 3 months to assess stability of the lymph  nodes.      Assessment & Plan:  Dyspnea on exertion PFT don't support significant AFL. Etiology here is not clear. I believe she will need cardiac risk stratification. Her Ct scan did not show ILD, but certainly effects of sarcoidosis could explain some of her sx (fever, sweats, weakness).  - will refer for cards eval - will repeat the CT scan early (before 8/'14) - defer trial BD for now - may decide to perform lung bx or EUS depending on the repeat Ct scan   Cough Likely due to GERD, untreated and symptomatic.  - start omeprazole bid x 2 weeks then go to qd  Mediastinal lymphadenopathy Associated with pulm nodules. ? Sarcoidosis vs other granulomatous process.  - will repeat her CT scan early - now - give the generalized sx.

## 2012-08-12 NOTE — Assessment & Plan Note (Addendum)
PFT don't support significant AFL. Etiology here is not clear. I believe she will need cardiac risk stratification. Her Ct scan did not show ILD, but certainly effects of sarcoidosis could explain some of her sx (fever, sweats, weakness).  - will refer for cards eval - will repeat the CT scan early (before 8/'14) - defer trial BD for now - may decide to perform lung bx or EUS depending on the repeat Ct scan

## 2012-08-12 NOTE — Patient Instructions (Addendum)
We will restart omeprazole. Take 49m twice a day for 2 weeks, then decrease to daily We will not start an inhaled medication at this time We will perform your CT scan of the chest early (now).  We will refer you to cardiology to insure that there is not a cardiac explanation for your fatigue and shortness of breath Follow with Dr BLamonte Sakainext available after you CT scan of the chest

## 2012-08-12 NOTE — Progress Notes (Signed)
PFT done today.

## 2012-08-12 NOTE — Assessment & Plan Note (Signed)
Associated with pulm nodules. ? Sarcoidosis vs other granulomatous process.  - will repeat her CT scan early - now - give the generalized sx.

## 2012-08-12 NOTE — Assessment & Plan Note (Signed)
Likely due to GERD, untreated and symptomatic.  - start omeprazole bid x 2 weeks then go to qd

## 2012-08-15 ENCOUNTER — Ambulatory Visit (INDEPENDENT_AMBULATORY_CARE_PROVIDER_SITE_OTHER)
Admission: RE | Admit: 2012-08-15 | Discharge: 2012-08-15 | Disposition: A | Discharge status: Home / Self Care | Payer: Medicare Other | Source: Ambulatory Visit | Attending: Emergency Medicine | Admitting: Emergency Medicine

## 2012-08-15 DIAGNOSIS — R590 Localized enlarged lymph nodes: Secondary | ICD-10-CM

## 2012-08-15 DIAGNOSIS — R599 Enlarged lymph nodes, unspecified: Secondary | ICD-10-CM

## 2012-08-21 ENCOUNTER — Other Ambulatory Visit: Payer: Self-pay

## 2012-08-21 DIAGNOSIS — Z1231 Encounter for screening mammogram for malignant neoplasm of breast: Secondary | ICD-10-CM

## 2012-08-26 ENCOUNTER — Ambulatory Visit (INDEPENDENT_AMBULATORY_CARE_PROVIDER_SITE_OTHER): Payer: Medicare Other | Admitting: Emergency Medicine

## 2012-08-26 ENCOUNTER — Encounter: Payer: Self-pay | Admitting: Emergency Medicine

## 2012-08-26 VITALS — BP 130/74 | HR 87 | Temp 97.9°F | Ht <= 58 in | Wt 114.4 lb

## 2012-08-26 DIAGNOSIS — R918 Other nonspecific abnormal finding of lung field: Secondary | ICD-10-CM

## 2012-08-26 NOTE — Assessment & Plan Note (Signed)
Stable by CT scan. Unclear etiology but entire clinical picture suspicious for sarcoidosis. There is some subtle GG change present. At this point not clear to me that the benefits of a lung bx are worth the risks. We may decide to reconsider this depending on her clinical course. Follow in 6 months

## 2012-08-26 NOTE — Progress Notes (Signed)
Subjective:    Patient ID: Meghan Wise, female    DOB: Feb 09, 1936, 77 y.o.   MRN: [id number]  HPI 76 yo woman former tobacco (45 pk-yrs), OA, hx raynauds + dry eyes (? Sjogrens). She was  hosp for severe hypercalcemia (on HCTZ, with hyper Vit D, ? Sarcoidosis w some shotty LAD and pulm nodules on CT chest, and elevated ACE level). SPEP was normal. She was also found to be hypothyroid on that admission. She was d/c to home on Pred after pamidronate, synthroid. Her Ca level has normalized, being followed by Dr Laurann Montana. She follows up today regarding her CT scan chest and PET scan done 06/04/12. The PET was performed given the possibility of lymphoma. Her small nodes had some very subtle hypermetabolism, but was inconsistent with lymphoma.   Tells me that she has noticed an increase in exertional SOB since d/c to home from the hospital. Most recently this showed up when she was doing some yard work. Prior to this she only noticed some mild dyspnea when climbing hills. No real mucous or wheeze.   ROV 08/12/12 -- f/u for dyspnea, LAD and pulm nodules on CT scan, hypercalcemia. She returns after PFT today >> normal AF but curve that suggests mild AFL. Decreased DLCO.  Her Ca and renal fxn are back to normal per pt (Dr Laurann Montana). She is having a lot of cough - more than before, usually dry. She has also noticed low grade fevers that have been off/on since beginning April. This is in addition to her night sweats. She c/o severe GERD, uses maalox frequently. Used to be on PPI but not currently. She is no longer able to do her usual activities - can't garden, etc. Breathing, weakness, fatigue are the sx.   ROV 08/26/12 -- dyspnea, LAD and pulm nodules on CT scan, hypercalcemia (treated and improved). Her PFT showed possible mild AFL. Started omeprazole bid to qd for cough last time. Repeat Ct scan chest done 08/15/12 >> no change in number and size of pulm nodules and mediastinal LAD.   She reports feeling better -  more active, better exertional tolerance. Her GERD is gone.  She is on pred 63m    Review of Systems  Constitutional: Negative for fever and unexpected weight change.  HENT: Negative for ear pain, nosebleeds, congestion, sore throat, rhinorrhea, sneezing, trouble swallowing, dental problem, postnasal drip and sinus pressure.   Eyes: Negative for redness and itching.  Respiratory: Positive for shortness of breath. Negative for cough, chest tightness and wheezing.   Cardiovascular: Negative for palpitations and leg swelling.  Gastrointestinal: Negative for nausea and vomiting.  Genitourinary: Negative for dysuria.  Musculoskeletal: Negative for joint swelling.  Skin: Negative for rash.  Neurological: Negative for headaches.  Hematological: Does not bruise/bleed easily.  Psychiatric/Behavioral: Negative for dysphoric mood. The patient is not nervous/anxious.       Objective:   Physical Exam Filed Vitals:   08/26/12 1558  BP: 130/74  Pulse: 87  Temp: 97.9 F (36.6 C)   Gen: Pleasant, well-nourished, in no distress,  normal affect  Lungs: No use of accessory muscles, clear without rales or rhonchi  Cardiovascular: RRR, heart sounds normal, no murmur or gallops, no peripheral edema  Musculoskeletal: No deformities, no cyanosis or clubbing  Neuro: alert, non focal  Skin: Warm, no lesions or rashes, fingers are cyanotic and cool   08/12/12 --  Comparison: Multiple priors, most recently PET CT 06/04/2012 and  chest c t of 05/24/2012.  Findings:  Mediastinum: Heart size is normal. There is no significant  pericardial fluid, thickening or pericardial calcification. There  is atherosclerosis of the thoracic aorta, the great vessels of the  mediastinum and the coronary arteries, including calcified  atherosclerotic plaque in the left anterior descending, left  circumflex and right coronary arteries. No pathologically enlarged  mediastinal or hilar lymph nodes. Multiple borderline  enlarged  mediastinal lymph nodes are noted, but are nonspecific and are  either similar to prior examinations, or decreased in size,  particularly in the subcarinal station. Please note that accurate  exclusion of hilar adenopathy is limited on noncontrast CT scans.  Esophagus is unremarkable in appearance.  Lungs/Pleura: There are several small pulmonary nodules scattered  throughout the lungs bilaterally. Specific examples include a  right upper lobe nodule measuring 7 mm with spiculated margins  (image 21 of series 5), a 7 mm subpleural nodule in the left lower  lobe along the major fissure (image 23 of series 5), as well as 3  mm and 4 mm nodules in the right lower lobe (image 28 of series 5).  These all appear similar to the prior examination from 05/24/2012.  No definite new or enlarging suspicious appearing pulmonary nodules  or masses are otherwise noted. Bilateral apical nodular  pleuroparenchymal thickening and calcification is compatible with  chronic scarring. High resolution images demonstrate some patchy  areas of mild ground-glass attenuation and some mild septal  thickening, without areas of frank subpleural reticulation,  honeycombing or traction bronchiectasis. Expiratory images are  remarkable for a mosaic pattern of geographic ground-glass  attenuation interspersed with areas of lucency, indicative of air  trapping, presumably related to small airways disease.  Upper Abdomen: Unremarkable.  Musculoskeletal: There are no aggressive appearing lytic or blastic  lesions noted in the visualized portions of the skeleton.  IMPRESSION:  1. No significant change in numerous small pulmonary nodules.  Borderline enlarged and enlarged mediastinal lymph nodes noted on  the prior examination appear either stable or decreased in size on  today's study and remain nonspecific. Continued attention on future  follow up studies is suggested, with a follow-up CT scan in 6  months  recommended at this time.  2. Findings compatible with air trapping related to small airways  disease, as above.  3. The appearance of the lung parenchyma is unusual, and could be  seen in the setting of very early interstitial lung disease,  however, at this point the pattern is nonspecific        Assessment & Plan:  Pulmonary nodules Stable by CT scan. Unclear etiology but entire clinical picture suspicious for sarcoidosis. There is some subtle GG change present. At this point not clear to me that the benefits of a lung bx are worth the risks. We may decide to reconsider this depending on her clinical course. Follow in 6 months

## 2012-08-26 NOTE — Patient Instructions (Addendum)
Your CT scan is stable compared with your prior study Please continue your medications, including your prednisone as managed by Dr Laurann Montana. Follow with Dr Lamonte Sakai in 6 months. We may decide to repeat a CXR at that time or even a CT scan depending on how you are feeling.

## 2012-08-30 ENCOUNTER — Other Ambulatory Visit: Payer: Self-pay | Admitting: Nephrology

## 2012-08-30 DIAGNOSIS — N179 Acute kidney failure, unspecified: Secondary | ICD-10-CM

## 2012-09-03 ENCOUNTER — Ambulatory Visit
Admission: RE | Admit: 2012-09-03 | Discharge: 2012-09-03 | Disposition: A | Discharge status: Home / Self Care | Payer: Medicare Other | Source: Ambulatory Visit | Attending: Nephrology | Admitting: Nephrology

## 2012-09-03 DIAGNOSIS — N179 Acute kidney failure, unspecified: Secondary | ICD-10-CM

## 2012-09-17 ENCOUNTER — Encounter: Payer: Self-pay | Admitting: Cardiovascular Disease

## 2012-09-17 ENCOUNTER — Ambulatory Visit (INDEPENDENT_AMBULATORY_CARE_PROVIDER_SITE_OTHER): Payer: Medicare Other | Admitting: Cardiovascular Disease

## 2012-09-17 ENCOUNTER — Encounter: Payer: Self-pay | Admitting: *Deleted

## 2012-09-17 VITALS — HR 76 | Ht <= 58 in | Wt 114.0 lb

## 2012-09-17 DIAGNOSIS — R0609 Other forms of dyspnea: Secondary | ICD-10-CM

## 2012-09-17 DIAGNOSIS — R0600 Dyspnea, unspecified: Secondary | ICD-10-CM

## 2012-09-17 DIAGNOSIS — I10 Essential (primary) hypertension: Secondary | ICD-10-CM

## 2012-09-17 DIAGNOSIS — I7300 Raynaud's syndrome without gangrene: Secondary | ICD-10-CM

## 2012-09-17 NOTE — Progress Notes (Signed)
Patient ID: Meghan Wise, female   DOB: 1936-04-01, 77 y.o.   MRN: [id number] Patient referred by Dr Lamonte Sakai for dyspnea  77 yo woman former tobacco (45 pk-yrs), OA, hx raynauds + dry eyes (? Sjogrens). She was hosp for severe hypercalcemia (on HCTZ, with hyper Vit D, ? Sarcoidosis w some shotty LAD and pulm nodules on CT chest, and elevated ACE level). SPEP was normal. She was also found to be hypothyroid on that admission. She was d/c to home on Pred after pamidronate, synthroid. Her Ca level has normalized, being followed by Dr Laurann Montana. She follows up today regarding her CT scan chest and PET scan done 06/04/12. The PET was performed given the possibility of lymphoma. Her small nodes had some very subtle hypermetabolism, but was inconsistent with lymphoma.  Tells me that she has noticed an increase in exertional SOB since d/c to home from the hospital. Most recently this showed up when she was doing some yard work. Prior to this she only noticed some mild dyspnea when climbing hills. No real mucous or wheeze.   PFT;s 4/14  >> normal AF but curve that suggests mild AFL. Decreased DLCO. Her Ca and renal fxn are back to normal per pt (Dr Laurann Montana). She is having a lot of cough - more than before, usually dry. She has also noticed low grade fevers that have been off/on since beginning April. This is in addition to her night sweats. She c/o severe GERD, uses maalox frequently. Used to be on PPI but not currently. She is no longer able to do her usual activities - can't garden, etc. Breathing, weakness, fatigue are the sx.   She has no cardiac symptoms.  She has not had ETT, echo or BNP.  She thinks dyspnea is a bit better Dr Kelton Pillar is weaning steroids.  She has Raynauds in her hands  ROS: Denies fever, malais, weight loss, blurry vision, decreased visual acuity, cough, sputum, SOB, hemoptysis, pleuritic pain, palpitaitons, heartburn, abdominal pain, melena, lower extremity edema, claudication, or rash.   All other systems reviewed and negative   General: Affect appropriate Healthy:  appears stated age 77: normal Neck supple with no adenopathy JVP normal no bruits no thyromegaly Lungs fine inspitory crackles at base no wheezing and good diaphragmatic motion Heart:  S1/S2 no murmur,rub, gallop or click PMI normal Abdomen: benighn, BS positve, no tenderness, no AAA no bruit.  No HSM or HJR Distal pulses intact with no bruits No edema Neuro non-focal Skin warm and dry  Hand with chronic changes of Raynauds with purple discoloration No muscular weakness  Medications Current Outpatient Prescriptions  Medication Sig Dispense Refill  . amLODipine (NORVASC) 10 MG tablet Take 1 tablet (10 mg total) by mouth daily.  30 tablet  0  . aspirin EC 325 MG tablet Take 325 mg by mouth daily.      . chlorpheniramine-HYDROcodone (TUSSIONEX) 10-8 MG/5ML LQCR Take 5 mLs by mouth every 12 (twelve) hours as needed (cough).  140 mL  0  . cholecalciferol (VITAMIN D) 1000 UNITS tablet Take 1,000 Units by mouth daily.      . folic acid-pyridoxine-cyancobalamin (FOLTX) 2.5-25-2 MG TABS Take 1 tablet by mouth daily.      Marland Kitchen levothyroxine (SYNTHROID, LEVOTHROID) 50 MCG tablet Take 50 mcg by mouth every morning.      Marland Kitchen MELATONIN PO Take 1 tablet by mouth at bedtime as needed. For sleep      . Multiple Vitamin (MULTIVITAMIN WITH MINERALS) TABS Take 1  tablet by mouth every morning.       Marland Kitchen omeprazole (PRILOSEC OTC) 20 MG tablet Take 1 tablet (20 mg total) by mouth daily.      Marland Kitchen omeprazole (PRILOSEC) 20 MG capsule Take 1 capsule (20 mg total) by mouth 2 (two) times daily.  60 capsule  11  . predniSONE (DELTASONE) 10 MG tablet Take 10 mg by mouth daily.      . simvastatin (ZOCOR) 20 MG tablet Take 20 mg by mouth every evening.       No current facility-administered medications for this visit.    Allergies Ace inhibitors; Codeine; Losartan; and Sulfa antibiotics  Family History: Family History  Problem  Relation Age of Onset  . Cancer      husband--esophageal  . Heart disease Mother   . Heart disease Father   . Diabetes Son   . Stroke Daughter     Social History: History   Social History  . Marital Status: Married    Spouse Name: N/A    Number of Children: 49  . Years of Education: N/A   Occupational History  . Retired     Art therapist Asst.   Social History Main Topics  . Smoking status: Former Smoker -- 1.50 packs/day for 60 years    Types: Cigarettes    Quit date: 04/25/1987  . Smokeless tobacco: Never Used  . Alcohol Use: No  . Drug Use: No  . Sexually Active: Not on file   Other Topics Concern  . Not on file   Social History Narrative  . No narrative on file    Electrocardiogram:  1/14  SR rate 60  Normal ECG   Today NSR rate 76 normal   Assessment and Plan

## 2012-09-17 NOTE — Assessment & Plan Note (Signed)
Not likely to be related to cardiac issues with normal exam and ECG.  F/U echo and BNP  F/U ETT since she has exertional dyspnea.  History and exam with decreased DLCO more consistant with lung disease form connective tissue process

## 2012-09-17 NOTE — Assessment & Plan Note (Signed)
Well controlled.  Continue current medications and low sodium Dash type diet.   Continue calcium blocker

## 2012-09-17 NOTE — Patient Instructions (Addendum)
Your physician has requested that you have an echocardiogram. Echocardiography is a painless test that uses sound waves to create images of your heart. It provides your doctor with information about the size and shape of your heart and how well your heart's chambers and valves are working. This procedure takes approximately one hour. There are no restrictions for this procedure.  Your physician has requested that you have an exercise tolerance test. For further information please visit [email or web address]. Please also follow instruction sheet, as given.  Your physician recommends that you return for lab work in: today  Your physician recommends that you schedule a follow-up appointment in: as needed. We will contact you with your test results.

## 2012-09-17 NOTE — Assessment & Plan Note (Signed)
Stable in warm weather. Can consider changing amlodipine to procardia in future if needed

## 2012-09-23 ENCOUNTER — Ambulatory Visit
Admission: RE | Admit: 2012-09-23 | Discharge: 2012-09-23 | Disposition: A | Discharge status: Home / Self Care | Payer: Medicare Other | Source: Ambulatory Visit

## 2012-09-23 DIAGNOSIS — Z1231 Encounter for screening mammogram for malignant neoplasm of breast: Secondary | ICD-10-CM

## 2012-09-24 ENCOUNTER — Other Ambulatory Visit: Payer: Self-pay | Admitting: Family Medicine

## 2012-09-24 DIAGNOSIS — R928 Other abnormal and inconclusive findings on diagnostic imaging of breast: Secondary | ICD-10-CM

## 2012-09-26 ENCOUNTER — Ambulatory Visit (HOSPITAL_COMMUNITY): Payer: Medicare Other | Attending: Internal Medicine | Admitting: Radiology

## 2012-09-26 DIAGNOSIS — R0600 Dyspnea, unspecified: Secondary | ICD-10-CM

## 2012-09-26 DIAGNOSIS — R0609 Other forms of dyspnea: Secondary | ICD-10-CM | POA: Insufficient documentation

## 2012-09-26 DIAGNOSIS — I10 Essential (primary) hypertension: Secondary | ICD-10-CM | POA: Insufficient documentation

## 2012-09-26 DIAGNOSIS — R0602 Shortness of breath: Secondary | ICD-10-CM

## 2012-09-26 DIAGNOSIS — Z87891 Personal history of nicotine dependence: Secondary | ICD-10-CM | POA: Insufficient documentation

## 2012-09-26 DIAGNOSIS — R0989 Other specified symptoms and signs involving the circulatory and respiratory systems: Secondary | ICD-10-CM | POA: Insufficient documentation

## 2012-09-26 NOTE — Progress Notes (Signed)
Echocardiogram performed.  

## 2012-09-27 ENCOUNTER — Ambulatory Visit
Admission: RE | Admit: 2012-09-27 | Discharge: 2012-09-27 | Disposition: A | Discharge status: Home / Self Care | Payer: Medicare Other | Source: Ambulatory Visit | Attending: Family Medicine | Admitting: Family Medicine

## 2012-09-27 DIAGNOSIS — R928 Other abnormal and inconclusive findings on diagnostic imaging of breast: Secondary | ICD-10-CM

## 2012-10-04 ENCOUNTER — Encounter: Payer: Medicare Other | Admitting: Physician Assistant

## 2012-10-22 ENCOUNTER — Ambulatory Visit (INDEPENDENT_AMBULATORY_CARE_PROVIDER_SITE_OTHER): Payer: Medicare Other | Admitting: Physician Assistant

## 2012-10-22 DIAGNOSIS — R0609 Other forms of dyspnea: Secondary | ICD-10-CM

## 2012-10-22 DIAGNOSIS — R0600 Dyspnea, unspecified: Secondary | ICD-10-CM

## 2012-10-22 NOTE — Progress Notes (Signed)
Exercise Treadmill Test  Pre-Exercise Testing Evaluation Rhythm: normal sinus  Rate: 81     Test  Exercise Tolerance Test Ordering MD: Jenkins Rouge, MD  Interpreting MD: Richardson Dopp, PA-C  Unique Test No: 1  Treadmill:  1  Indication for ETT: exertional dyspnea  Contraindication to ETT: No   Stress Modality: exercise - treadmill  Cardiac Imaging Performed: non   Protocol: standard Bruce - maximal  Max BP:  176/119  Max MPHR (bpm):  145 85% MPR (bpm):  123  MPHR obtained (bpm):  123 % MPHR obtained:  85  Reached 85% MPHR (min:sec):  2:10 Total Exercise Time (min-sec):  2:20  Workload in METS:  4.6 Borg Scale: 17  Reason ETT Terminated:  dyspnea    ST Segment Analysis At Rest: normal ST segments - no evidence of significant ST depression With Exercise: no evidence of significant ST depression  Other Information Arrhythmia:  No Angina during ETT:  absent (0) Quality of ETT:  diagnostic  ETT Interpretation:  normal - no evidence of ischemia by ST analysis  Comments: Poor exercise tolerance. No chest pain. She had significant dyspnea and had to stop exercise due to dyspnea. Hypertensive BP response to exercise. No significant ST-T changes to suggest ischemia.  O2 sat difficult to read (? Related to Raynauds).  O2 86% immediately post exercise => 95% in recovery  Recommendations: F/u with Dr. Jenkins Rouge as recommended. Signed,  Richardson Dopp, PA-C   10/22/2012 11:21 AM

## 2012-12-09 ENCOUNTER — Telehealth: Payer: Self-pay | Admitting: Emergency Medicine

## 2012-12-09 ENCOUNTER — Other Ambulatory Visit: Payer: Medicare Other

## 2012-12-09 NOTE — Telephone Encounter (Signed)
error

## 2012-12-12 ENCOUNTER — Ambulatory Visit (INDEPENDENT_AMBULATORY_CARE_PROVIDER_SITE_OTHER)
Admission: RE | Admit: 2012-12-12 | Discharge: 2012-12-12 | Disposition: A | Discharge status: Home / Self Care | Payer: Medicare Other | Source: Ambulatory Visit | Attending: Emergency Medicine | Admitting: Emergency Medicine

## 2012-12-12 DIAGNOSIS — J811 Chronic pulmonary edema: Secondary | ICD-10-CM

## 2012-12-17 ENCOUNTER — Telehealth: Payer: Self-pay | Admitting: Emergency Medicine

## 2012-12-17 NOTE — Telephone Encounter (Signed)
Spoke with patient-aware that [place] has to sign results and he will be back in the office in the morning; will forward to him to advise. Thanks .

## 2012-12-18 NOTE — Telephone Encounter (Signed)
I spoke with patient about results and she verbalized understanding and had no questions 

## 2012-12-18 NOTE — Telephone Encounter (Signed)
Let her know that the CT scan shows that her nodules and lymph nodes all look stable compared with the prior films. There is a slight increase in the mild scarring at the bottom of your lungs. We can discuss further when we follow up.

## 2013-01-16 ENCOUNTER — Encounter: Payer: Self-pay | Admitting: Emergency Medicine

## 2013-01-16 ENCOUNTER — Ambulatory Visit (INDEPENDENT_AMBULATORY_CARE_PROVIDER_SITE_OTHER): Payer: Medicare Other | Admitting: Emergency Medicine

## 2013-01-16 VITALS — BP 100/60 | HR 86 | Temp 97.1°F | Ht 58.5 in | Wt 118.4 lb

## 2013-01-16 DIAGNOSIS — R0609 Other forms of dyspnea: Secondary | ICD-10-CM

## 2013-01-16 DIAGNOSIS — R0600 Dyspnea, unspecified: Secondary | ICD-10-CM

## 2013-01-16 DIAGNOSIS — R918 Other nonspecific abnormal finding of lung field: Secondary | ICD-10-CM

## 2013-01-16 NOTE — Assessment & Plan Note (Signed)
No changes on Ct scan 1/'14 and 4/'14. Entire clinical picture consistent with sarcoidosis. No real good targets for bx right now (although random TBBx can often show granulomas). Will defer bx for now, follow CXR and CT

## 2013-01-16 NOTE — Progress Notes (Signed)
Subjective:    Patient ID: Meghan Wise, female    DOB: 27-Nov-1935, 77 y.o.   MRN: [id number]  HPI 77 yo woman former tobacco (45 pk-yrs), OA, hx raynauds + dry eyes (? Sjogrens). She was  hosp for severe hypercalcemia (on HCTZ, with hyper Vit D, ? Sarcoidosis w some shotty LAD and pulm nodules on CT chest, and elevated ACE level). SPEP was normal. She was also found to be hypothyroid on that admission. She was d/c to home on Pred after pamidronate, synthroid. Her Ca level has normalized, being followed by Dr Laurann Montana. She follows up today regarding her CT scan chest and PET scan done 06/04/12. The PET was performed given the possibility of lymphoma. Her small nodes had some very subtle hypermetabolism, but was inconsistent with lymphoma.   Tells me that she has noticed an increase in exertional SOB since d/c to home from the hospital. Most recently this showed up when she was doing some yard work. Prior to this she only noticed some mild dyspnea when climbing hills. No real mucous or wheeze.   ROV 08/12/12 -- f/u for dyspnea, LAD and pulm nodules on CT scan, hypercalcemia. She returns after PFT today >> normal AF but curve that suggests mild AFL. Decreased DLCO.  Her Ca and renal fxn are back to normal per pt (Dr Laurann Montana). She is having a lot of cough - more than before, usually dry. She has also noticed low grade fevers that have been off/on since beginning April. This is in addition to her night sweats. She c/o severe GERD, uses maalox frequently. Used to be on PPI but not currently. She is no longer able to do her usual activities - can't garden, etc. Breathing, weakness, fatigue are the sx.   ROV 08/26/12 -- dyspnea, LAD and pulm nodules on CT scan, hypercalcemia (treated and improved). Her PFT showed possible mild AFL. Started omeprazole bid to qd for cough last time. Repeat Ct scan chest done 08/15/12 >> no change in number and size of pulm nodules and mediastinal LAD.   She reports feeling better -  more active, better exertional tolerance. Her GERD is gone.  She is on pred 71m   ROV 01/16/13 -- f/u for mild AFL, nodular disease (stable by CT scan ), dyspnea, cough, hx hypercalcemia, all suspicious for possible sarcoidosis. She continues to have SOB, has been worse with walking uphill. No overt wheezing or cough.    Review of Systems  Constitutional: Negative for fever and unexpected weight change.  HENT: Negative for ear pain, nosebleeds, congestion, sore throat, rhinorrhea, sneezing, trouble swallowing, dental problem, postnasal drip and sinus pressure.   Eyes: Negative for redness and itching.  Respiratory: Positive for shortness of breath. Negative for cough, chest tightness and wheezing.   Cardiovascular: Negative for palpitations and leg swelling.  Gastrointestinal: Negative for nausea and vomiting.  Genitourinary: Negative for dysuria.  Musculoskeletal: Negative for joint swelling.  Skin: Negative for rash.  Neurological: Negative for headaches.  Hematological: Does not bruise/bleed easily.  Psychiatric/Behavioral: Negative for dysphoric mood. The patient is not nervous/anxious.       Objective:   Physical Exam Filed Vitals:   01/16/13 1216  BP: 100/60  Pulse: 86  Temp: 97.1 F (36.2 C)  TempSrc: Oral  Height: 4' 10.5" (1.486 m)  Weight: 118 lb 6.4 oz (53.706 kg)  SpO2: 96%   Gen: Pleasant, well-nourished, in no distress,  normal affect  Lungs: No use of accessory muscles, clear without rales or rhonchi  Cardiovascular: RRR, heart sounds normal, no murmur or gallops, no peripheral edema  Musculoskeletal: No deformities, no cyanosis or clubbing  Neuro: alert, non focal  Skin: Warm, no lesions or rashes, fingers are cyanotic and cool  CT 12/12/12 --  Comparison: 08/15/2012  Findings: Bilateral irregular pulmonary nodules are all stable.  There is interstitial thickening that is most evident at the lung  bases. This has mildly increased from the prior exam.   Areas of coarse reticular scarring mostly in the anterior upper  lobes and at the apices are stable.  There are no focal areas of consolidation. No pleural effusion or  pneumothorax is seen.  The heart is normal in size. There are mild coronary artery  calcifications. The great vessels are normal in caliber.  There is borderline mediastinal adenopathy. A right paratracheal  lymph node measures 9 mm, stable. A pretracheal lymph node just  above the aortic arch measures 8 mm, also unchanged. There is a  prevascular adenopathy, the largest node measuring 9 mm in short  axis, minimally increased from prior study. This difference may be  technical only. A node just anterior to this in the anterior  mediastinum is 8.6 mm, unchanged. A subcarinal lymph node is  larger, measuring 15 mm in short axis where had measured 9.6 mm.  Limited evaluation of the upper abdomen is unremarkable.  There are mild degenerative changes along the mid thoracic spine.  There are no osteoblastic or osteolytic lesions.  IMPRESSION:  Stable small pulmonary nodules. These have been stable since  January 2014. Recommend continued surveillance to establish 2  years of stability. Stable areas of lung scarring.  Mild interstitial thickening. This has slightly increased in the  lung bases when compared the prior exam. The etiology of this is  unclear. There is no cardiomegaly or pleural fluid to suggest  congestive heart failure although this remains possible.  Mild mediastinal adenopathy. A subcarinal node has enlarged from  prior study. There has been no other convincing change.     Assessment & Plan:  Pulmonary nodules No changes on Ct scan 1/'14 and 4/'14. Entire clinical picture consistent with sarcoidosis. No real good targets for bx right now (although random TBBx can often show granulomas). Will defer bx for now, follow CXR and CT  Dyspnea on exertion Again, suspect this is sarcoid with an asthma picture. She  has subtle AFL on spiro. Would like to try treating her for obstruction for several weeks and see if she benefits.  - trial symbicort bid - walking oximetry to r/o occult obstruction.  - rov 6 weeks

## 2013-01-16 NOTE — Patient Instructions (Addendum)
Walking oximetry today  We will do a trial of Symbicort 2 puffs twice a day to see if it helps your breathing. Please rinse out your mouth and gargle after using.  Your CT scan shows stable small pulmonary nodules compared with 07/2012.  Follow with Dr Lamonte Sakai in 6 weeks or sooner if you have any problems

## 2013-01-16 NOTE — Assessment & Plan Note (Addendum)
Again, suspect this is sarcoid with an asthma picture. She has subtle AFL on spiro. Would like to try treating her for obstruction for several weeks and see if she benefits.  - trial symbicort bid - walking oximetry to r/o occult desaturation.  - rov 6 weeks

## 2013-02-17 ENCOUNTER — Telehealth: Payer: Self-pay | Admitting: Emergency Medicine

## 2013-02-17 MED ORDER — BUDESONIDE-FORMOTEROL FUMARATE 160-4.5 MCG/ACT IN AERO: 2.0000 | INHALATION_SPRAY | Freq: Two times a day (BID) | RESPIRATORY_TRACT | Status: DC

## 2013-02-17 NOTE — Telephone Encounter (Signed)
I spoke with the pt and she states she will run out of symbicort before her appt and wanted to know if she was supposed to continue it. According to Ov note she is to continue. Samples left at front for the pt to use util OV with RB on 03/04/13. Diamond Bing, CMA

## 2013-02-25 ENCOUNTER — Other Ambulatory Visit: Payer: Self-pay | Admitting: Family Medicine

## 2013-02-25 DIAGNOSIS — N63 Unspecified lump in unspecified breast: Secondary | ICD-10-CM

## 2013-02-27 ENCOUNTER — Encounter: Payer: Self-pay | Admitting: Podiatry

## 2013-02-27 ENCOUNTER — Ambulatory Visit (INDEPENDENT_AMBULATORY_CARE_PROVIDER_SITE_OTHER): Payer: Medicare Other | Admitting: Podiatry

## 2013-02-27 VITALS — BP 143/77 | HR 95 | Resp 16

## 2013-02-27 DIAGNOSIS — L03039 Cellulitis of unspecified toe: Secondary | ICD-10-CM

## 2013-02-27 DIAGNOSIS — L600 Ingrowing nail: Secondary | ICD-10-CM

## 2013-02-27 DIAGNOSIS — I739 Peripheral vascular disease, unspecified: Secondary | ICD-10-CM

## 2013-02-27 NOTE — Progress Notes (Signed)
Subjective:     Patient ID: Meghan Wise, female   DOB: 09-18-1935, 77 y.o.   MRN: [id number]  HPI patient presents stating I have a painful ingrown toenail on my right big toe for for 5 months. Also states that she gets some cramps in her right leg over left leg and has some foot pain in general   Review of Systems  All other systems reviewed and are negative.       Objective:   Physical Exam  Constitutional: She is oriented to person, place, and time.  Musculoskeletal: Normal range of motion.  Neurological: She is oriented to person, place, and time.  Skin: Skin is dry.   I noted the pulses on the right foot to be diminished versus the left with plus   one over four right DP +04 right PT and +2/4 left DP and +1/4 PT. The right hallux medial border is incurvated and tender when pressed with no redness or erythema  Assessment:     Possible vascular disease of the right lower extremity with a painful ingrown toenail right hallux    Plan:     Reviewed the condition at this point I have recommended a temporary procedure to relieve the pressure on the right hallux and vascular evaluation to be performed on patient. I sent off for vascular evaluation at [facility] heart and vascular and today I infiltrated 60 mg Xylocaine Marcaine mixture removed a small border of the right hallux a small amount of proud flesh necrotic tissue and applied sterile dressing and instructed on soaks. Reappoint after we get results of vascular testing

## 2013-02-27 NOTE — Progress Notes (Signed)
  Subjective:    Patient ID: Meghan Wise, female    DOB: [birth year], 77 y.o.   MRN: [id number]  HPI Comments: N - tenderness L - 1st toe right - lateral D - 4-5 mos O - gradual C - ingrown toenail, worse A - pressure T - change in shoe gear      Review of Systems  HENT: Positive for hearing loss.   Respiratory: Positive for cough and shortness of breath.   Cardiovascular:       Calf pain with walking  All other systems reviewed and are negative.       Objective:   Physical Exam        Assessment & Plan:

## 2013-03-04 ENCOUNTER — Encounter (INDEPENDENT_AMBULATORY_CARE_PROVIDER_SITE_OTHER): Payer: Self-pay

## 2013-03-04 ENCOUNTER — Telehealth: Payer: Self-pay | Admitting: *Deleted

## 2013-03-04 ENCOUNTER — Telehealth: Payer: Self-pay | Admitting: Emergency Medicine

## 2013-03-04 ENCOUNTER — Encounter: Payer: Self-pay | Admitting: Emergency Medicine

## 2013-03-04 ENCOUNTER — Ambulatory Visit (INDEPENDENT_AMBULATORY_CARE_PROVIDER_SITE_OTHER): Payer: Medicare Other | Admitting: Emergency Medicine

## 2013-03-04 VITALS — BP 148/78 | HR 70 | Ht <= 58 in | Wt 118.0 lb

## 2013-03-04 DIAGNOSIS — R0989 Other specified symptoms and signs involving the circulatory and respiratory systems: Secondary | ICD-10-CM

## 2013-03-04 DIAGNOSIS — R0600 Dyspnea, unspecified: Secondary | ICD-10-CM

## 2013-03-04 DIAGNOSIS — R0609 Other forms of dyspnea: Secondary | ICD-10-CM

## 2013-03-04 MED ORDER — BUDESONIDE-FORMOTEROL FUMARATE 160-4.5 MCG/ACT IN AERO: 2.0000 | INHALATION_SPRAY | Freq: Two times a day (BID) | RESPIRATORY_TRACT | Status: DC

## 2013-03-04 NOTE — Telephone Encounter (Signed)
I spoke with the pt and she states she thought about her options as discussed with RB today at visit and she states she wants to go with the second opinion first and if they agree with Dr. Lamonte Sakai then she will feel better about getting a heart cath at that time. Please advise. [place] Bing, CMA

## 2013-03-04 NOTE — Progress Notes (Signed)
Subjective:    Patient ID: Meghan Wise, female    DOB: 15-Feb-1936, 77 y.o.   MRN: [id number]  HPI 77 yo woman former tobacco (45 pk-yrs), OA, hx raynauds + dry eyes (? Sjogrens). She was  hosp for severe hypercalcemia (on HCTZ, with hyper Vit D, ? Sarcoidosis w some shotty LAD and pulm nodules on CT chest, and elevated ACE level). SPEP was normal. She was also found to be hypothyroid on that admission. She was d/c to home on Pred after pamidronate, synthroid. Her Ca level has normalized, being followed by Dr Laurann Montana. She follows up today regarding her CT scan chest and PET scan done 06/04/12. The PET was performed given the possibility of lymphoma. Her small nodes had some very subtle hypermetabolism, but was inconsistent with lymphoma.   Tells me that she has noticed an increase in exertional SOB since d/c to home from the hospital. Most recently this showed up when she was doing some yard work. Prior to this she only noticed some mild dyspnea when climbing hills. No real mucous or wheeze.   ROV 08/12/12 -- f/u for dyspnea, LAD and pulm nodules on CT scan, hypercalcemia. She returns after PFT today >> normal AF but curve that suggests mild AFL. Decreased DLCO.  Her Ca and renal fxn are back to normal per pt (Dr Laurann Montana). She is having a lot of cough - more than before, usually dry. She has also noticed low grade fevers that have been off/on since beginning April. This is in addition to her night sweats. She c/o severe GERD, uses maalox frequently. Used to be on PPI but not currently. She is no longer able to do her usual activities - can't garden, etc. Breathing, weakness, fatigue are the sx.   ROV 08/26/12 -- dyspnea, LAD and pulm nodules on CT scan, hypercalcemia (treated and improved). Her PFT showed possible mild AFL. Started omeprazole bid to qd for cough last time. Repeat Ct scan chest done 08/15/12 >> no change in number and size of pulm nodules and mediastinal LAD.   She reports feeling better -  more active, better exertional tolerance. Her GERD is gone.  She is on pred 52m   ROV 01/16/13 -- f/u for mild AFL, nodular disease (stable by CT scan ), dyspnea, cough, hx hypercalcemia, all suspicious for possible sarcoidosis. She continues to have SOB, has been worse with walking uphill. No overt wheezing or cough.   ROV 03/04/13 -- f/u for mild AFL, nodular disease. Suspect sarcoid although not bx proven. Last time we did trial symbicort > she can tell a little improvement in functional capacity but not as much as she had hoped. She has documented exertional hypoxemia but has not wanted O2.    Review of Systems  Constitutional: Negative for fever and unexpected weight change.  HENT: Negative for congestion, dental problem, ear pain, nosebleeds, postnasal drip, rhinorrhea, sinus pressure, sneezing, sore throat and trouble swallowing.   Eyes: Negative for redness and itching.  Respiratory: Positive for shortness of breath. Negative for cough, chest tightness and wheezing.   Cardiovascular: Negative for palpitations and leg swelling.  Gastrointestinal: Negative for nausea and vomiting.  Genitourinary: Negative for dysuria.  Musculoskeletal: Negative for joint swelling.  Skin: Negative for rash.  Neurological: Negative for headaches.  Hematological: Does not bruise/bleed easily.  Psychiatric/Behavioral: Negative for dysphoric mood. The patient is not nervous/anxious.       Objective:   Physical Exam Filed Vitals:   03/04/13 1019  BP: 148/78  Pulse: 70  Height: _0  (1.473 m)  Weight: 118 lb (53.524 kg)  SpO2: 87%   Gen: Pleasant, well-nourished, in no distress,  normal affect  Lungs: No use of accessory muscles, clear without rales or rhonchi  Cardiovascular: RRR, heart sounds normal, no murmur or gallops, no peripheral edema  Musculoskeletal: No deformities, no cyanosis or clubbing  Neuro: alert, non focal  Skin: Warm, no lesions or rashes, fingers are cyanotic and  cool  CT 12/12/12 --  Comparison: 08/15/2012  Findings: Bilateral irregular pulmonary nodules are all stable.  There is interstitial thickening that is most evident at the lung  bases. This has mildly increased from the prior exam.  Areas of coarse reticular scarring mostly in the anterior upper  lobes and at the apices are stable.  There are no focal areas of consolidation. No pleural effusion or  pneumothorax is seen.  The heart is normal in size. There are mild coronary artery  calcifications. The great vessels are normal in caliber.  There is borderline mediastinal adenopathy. A right paratracheal  lymph node measures 9 mm, stable. A pretracheal lymph node just  above the aortic arch measures 8 mm, also unchanged. There is a  prevascular adenopathy, the largest node measuring 9 mm in short  axis, minimally increased from prior study. This difference may be  technical only. A node just anterior to this in the anterior  mediastinum is 8.6 mm, unchanged. A subcarinal lymph node is  larger, measuring 15 mm in short axis where had measured 9.6 mm.  Limited evaluation of the upper abdomen is unremarkable.  There are mild degenerative changes along the mid thoracic spine.  There are no osteoblastic or osteolytic lesions.  IMPRESSION:  Stable small pulmonary nodules. These have been stable since  January 2014. Recommend continued surveillance to establish 2  years of stability. Stable areas of lung scarring.  Mild interstitial thickening. This has slightly increased in the  lung bases when compared the prior exam. The etiology of this is  unclear. There is no cardiomegaly or pleural fluid to suggest  congestive heart failure although this remains possible.  Mild mediastinal adenopathy. A subcarinal node has enlarged from  prior study. There has been no other convincing change.     Assessment & Plan:  Dyspnea on exertion She probably benefited from the symbicort. She does not want o2  with exertion.  - discussed exertional o2, she doesn't want - discussed today possible R heart cath. She wants to think about it. I believe that her hypoxemia is out of proportion to her PFT and Ct scan

## 2013-03-04 NOTE — Assessment & Plan Note (Signed)
She probably benefited from the symbicort. She does not want o2 with exertion.  - discussed exertional o2, she doesn't want - discussed today possible R heart cath. She wants to think about it. I believe that her hypoxemia is out of proportion to her PFT and Ct scan

## 2013-03-04 NOTE — Patient Instructions (Signed)
Please continue Symbicort twice a day You need to wear oxygen with exertion. Please consider this so we can discuss further in the future.  I believe the next best test for [place] to evaluate your breathing and oxygen is a right heart catherization. Please think about this and call back to discuss further.  Follow with Dr Lamonte Sakai in 1 month or next available.

## 2013-03-04 NOTE — Telephone Encounter (Signed)
Pt states that Dr Paulla Dolly would refer for lower arterial dopplers, but had not received a call.  I spoke with Otila Kluver at [facility] heart and vascular [phone] and she states the referral was there and she would be called soon.  I will call pt with the information.

## 2013-03-04 NOTE — Telephone Encounter (Signed)
I think it is fine for her to get a second opinion. She should think about who she wants to see. We can refer her if necessary or she can do a self-referral. If she wants [place] to recommend someone, Id recommend Dr Lawernce Ion at [facility].

## 2013-03-05 NOTE — Telephone Encounter (Signed)
Advised pt that her disability placard was available for pick up at the front desk.

## 2013-03-11 ENCOUNTER — Ambulatory Visit (HOSPITAL_COMMUNITY)
Admission: RE | Admit: 2013-03-11 | Discharge: 2013-03-11 | Disposition: A | Discharge status: Home / Self Care | Payer: Medicare Other | Source: Ambulatory Visit | Attending: Podiatry | Admitting: Podiatry

## 2013-03-11 DIAGNOSIS — I739 Peripheral vascular disease, unspecified: Secondary | ICD-10-CM | POA: Insufficient documentation

## 2013-03-11 NOTE — Progress Notes (Signed)
Right Lower Extremity Arterial Duplex Completed. Negative. [place]

## 2013-03-24 ENCOUNTER — Telehealth: Payer: Self-pay | Admitting: Emergency Medicine

## 2013-03-24 DIAGNOSIS — R0600 Dyspnea, unspecified: Secondary | ICD-10-CM

## 2013-03-24 DIAGNOSIS — R0609 Other forms of dyspnea: Secondary | ICD-10-CM

## 2013-03-24 NOTE — Telephone Encounter (Signed)
Per last ov w/ RB on 11.11.14: Patient Instructions     Please continue Symbicort twice a day  You need to wear oxygen with exertion. Please consider this so we can discuss further in the future.  I believe the next best test for [place] to evaluate your breathing and oxygen is a right heart catherization. Please think about this and call back to discuss further.  Follow with Dr Lamonte Sakai in 1 month or next available.    Called spoke with patient who reported that she had discussed with RB the potential of obtaining a second opinion regarding recs to have a heart cath.  Pt would like someone outside of the practice and with experience with Sarcoidosis.  RB is scheduled nite float all week and pt is okay with a call back "in the next few days."  Dr Lamonte Sakai please advise if okay to place referral for 2nd opinion.  Thank you.

## 2013-03-25 NOTE — Telephone Encounter (Signed)
Called pt, no answer so left message

## 2013-03-25 NOTE — Telephone Encounter (Signed)
Pt is calling back [phone]

## 2013-03-31 ENCOUNTER — Ambulatory Visit
Admission: RE | Admit: 2013-03-31 | Discharge: 2013-03-31 | Disposition: A | Discharge status: Home / Self Care | Payer: Medicare Other | Source: Ambulatory Visit | Attending: Family Medicine | Admitting: Family Medicine

## 2013-03-31 DIAGNOSIS — N63 Unspecified lump in unspecified breast: Secondary | ICD-10-CM

## 2013-03-31 NOTE — Telephone Encounter (Signed)
I called and spoke with pt. Apologized she has not been called back. She reports she is giving RB until friday to call her or she will call for 2nd opinion on her own. Please advise RB thanks

## 2013-03-31 NOTE — Telephone Encounter (Signed)
Discussed with Ms Ginger Organ. She would like to get second opinion regarding the presumed dx of sarcoidosis (no bx done and no clear bx target at this time) and the probable PAH. She has dyspnea out of proportion  to her CT scan and PFT and likely needs R heart cath. She would like to discuss both of these issues at a tertiary center. I will refer to [place] to get 2nd opinion.

## 2013-04-03 ENCOUNTER — Ambulatory Visit: Payer: Medicare Other | Admitting: Emergency Medicine

## 2013-06-23 ENCOUNTER — Encounter: Payer: Self-pay | Admitting: Podiatry

## 2013-06-23 ENCOUNTER — Ambulatory Visit (INDEPENDENT_AMBULATORY_CARE_PROVIDER_SITE_OTHER): Payer: Medicare Other | Admitting: Podiatry

## 2013-06-23 VITALS — BP 117/63 | HR 91 | Resp 16

## 2013-06-23 DIAGNOSIS — L600 Ingrowing nail: Secondary | ICD-10-CM

## 2013-06-23 NOTE — Patient Instructions (Signed)
ANTIBACTERIAL SOAP INSTRUCTIONS  THE DAY AFTER PROCEDURE  Please follow the instructions your doctor has marked.   Shower as usual. Before getting out, place a drop of antibacterial liquid soap (Dial) on a wet, clean washcloth.  Gently wipe washcloth over affected area.  Afterward, rinse the area with warm water.  Blot the area dry with a soft cloth and cover with antibiotic ointment (neosporin, polysporin, bacitracin) and band aid or gauze and tape  Place 3-4 drops of antibacterial liquid soap in a quart of warm tap water.  Submerge foot into water for 20 minutes.  If bandage was applied after your procedure, leave on to allow for easy lift off, then remove and continue with soak for the remaining time.  Next, blot area dry with a soft cloth and cover with a bandage.  Apply other medications as directed by your doctor, such as cortisporin otic solution (eardrops) or neosporin antibiotic ointment

## 2013-06-23 NOTE — Progress Notes (Signed)
Subjective:     Patient ID: Meghan Wise, female   DOB: 08-19-1935, 78 y.o.   MRN: [id number]  HPI patient presents for correction of her right big toenails stating that her circulation was good which I evaluated and found to be excellent based on the results from Dr. Gwenlyn Found   Review of Systems     Objective:   Physical Exam Neurovascular status intact with no health history changes in incurvated hallux nail right medial border with pain    Assessment:     Chronic ingrown toenail right hallux medial border with pain with excellent circulatory status    Plan:     Reviewed per minute procedure and explained risk the patient. Patient wants procedure and today I infiltrated 60 mg Xylocaine Marcaine mixture remove the medial border exposed matrix and applied phenol 3 applications followed by alcohol lavaged and sterile dressing. Gave instructions on soaks and reappoint

## 2013-07-14 ENCOUNTER — Telehealth: Payer: Self-pay | Admitting: *Deleted

## 2013-07-14 NOTE — Telephone Encounter (Signed)
I need to speak to a nurse about an ingrown toenail I had done on 06/23/13.  I returned her call.  She stated that it's extremely sore, it wakes her up at night when it touches the covers. I informed her that it's normal to still be sore.   I asked if it's red, swollen or has an odor.  She stated no.  I informed her that we can schedule her an appointment with Dr. Paulla Dolly if she liked.  Patient stated she would prefer to see him.  I sent her to a scheduler.

## 2013-07-16 ENCOUNTER — Ambulatory Visit (INDEPENDENT_AMBULATORY_CARE_PROVIDER_SITE_OTHER): Payer: Medicare Other | Admitting: Podiatry

## 2013-07-16 ENCOUNTER — Encounter: Payer: Self-pay | Admitting: Podiatry

## 2013-07-16 VITALS — BP 128/62 | HR 95 | Resp 20

## 2013-07-16 DIAGNOSIS — L03039 Cellulitis of unspecified toe: Secondary | ICD-10-CM

## 2013-07-17 NOTE — Progress Notes (Signed)
Subjective:     Patient ID: Meghan Wise, female   DOB: 1935-06-24, 78 y.o.   MRN: [id number]  HPI patient presents with ingrown toenail correction right hallux that is moderately bothersome and she is concerned of infection   Review of Systems     Objective:   Physical Exam Neurovascular status unchanged with small amount redness in the proximal portion nailbed right hallux medial border with no proximal edema erythema or drainage    Assessment:     Crusted area with redness which may be a very mild paronychia infection    Plan:     Instructed on soaks and debridement tissue. Patient will be seen back as needed but I do not recommend antibiotics and less it were to turn red draining or swollen

## 2013-08-03 ENCOUNTER — Encounter: Payer: Self-pay | Admitting: *Deleted

## 2013-08-28 ENCOUNTER — Telehealth (HOSPITAL_COMMUNITY): Payer: Self-pay

## 2013-08-28 NOTE — Telephone Encounter (Signed)
Spoke with patient regarding entrance to Pulmonary Rehab.  Patient is interested and is checking with her insurance company to verify coverage.  Patient states she will follow up.

## 2013-09-16 ENCOUNTER — Other Ambulatory Visit: Payer: Self-pay | Admitting: Family Medicine

## 2013-09-16 DIAGNOSIS — N6320 Unspecified lump in the left breast, unspecified quadrant: Secondary | ICD-10-CM

## 2013-09-22 ENCOUNTER — Encounter (HOSPITAL_COMMUNITY): Payer: Self-pay

## 2013-09-22 ENCOUNTER — Encounter (HOSPITAL_COMMUNITY)
Admission: RE | Admit: 2013-09-22 | Discharge: 2013-09-22 | Disposition: A | Discharge status: Home / Self Care | Payer: Medicare Other | Source: Ambulatory Visit | Attending: Internal Medicine | Admitting: Internal Medicine

## 2013-09-22 VITALS — BP 136/67 | HR 78 | Resp 16 | Ht <= 58 in | Wt 116.6 lb

## 2013-09-22 DIAGNOSIS — I10 Essential (primary) hypertension: Secondary | ICD-10-CM | POA: Insufficient documentation

## 2013-09-22 DIAGNOSIS — I5033 Acute on chronic diastolic (congestive) heart failure: Secondary | ICD-10-CM | POA: Insufficient documentation

## 2013-09-22 DIAGNOSIS — I2721 Secondary pulmonary arterial hypertension: Secondary | ICD-10-CM

## 2013-09-22 DIAGNOSIS — Z5189 Encounter for other specified aftercare: Secondary | ICD-10-CM | POA: Insufficient documentation

## 2013-09-22 NOTE — Progress Notes (Signed)
[name] 78 y.o. female Pulmonary Rehab Orientation Note Patient arrived today in [facility] and Pul[facility] Rehab for orientation to Pulmonary Rehab. She was transported from [employer] via wheel chair. She does not carry portable oxygen. Per pt, she uses oxygen never. Color good, skin warm and dry. Patient is oriented to time and place. Patient's medical history and medications reviewed. Heart rate is normal, breath sounds clear to auscultation, no wheezes, rales, or rhonchi, with fine crackles in bases. Grip strength equal, strong. Distal pulses palpable. Patient reports she does take medications as prescribed. Patient states she follows a Low Sodium. The patient reports no specific efforts to gain or lose weight.. Patient's weight will be monitored closely. Demonstration and practice of PLB using pulse oximeter. Patient able to return demonstration satisfactorily. Safety and hand hygiene in the exercise area reviewed with patient. Patient voices understanding of the information reviewed. Department expectations discussed with patient and achievable goals were set. The patient shows enthusiasm about attending the program and we look forward to working with this nice lady. The patient is scheduled for a 6 min walk test on 09/25/2013 at 4pm and to begin exercise on Tuesday 09/30/13 in the 1030 class.   _0 @

## 2013-09-23 ENCOUNTER — Telehealth (HOSPITAL_COMMUNITY): Payer: Self-pay

## 2013-09-23 NOTE — Progress Notes (Signed)
Just spoke with patient regarding need to place walk test and exercise sessions on hold until we have an adequate way to monitor o2 saturations. Patient states this works best for her too as she went to her primary MD yesterday PM for increased SOB and neck pain. Stated primary prescribed pain medication and asked her to follow up with her [facility] pulmonologist to see if he agreed with the Dx of chest wall inflamation. She is awaiting return call from the pulm. Office. Will continue to follow patient and reschedule her when we have the proper equipment to safely monitor o2 sats.

## 2013-09-25 ENCOUNTER — Other Ambulatory Visit: Payer: Medicare Other

## 2013-09-25 ENCOUNTER — Inpatient Hospital Stay (HOSPITAL_COMMUNITY): Admission: RE | Admit: 2013-09-25 | Payer: Medicare Other | Source: Ambulatory Visit

## 2013-09-27 ENCOUNTER — Emergency Department (HOSPITAL_COMMUNITY): Payer: Medicare Other

## 2013-09-27 ENCOUNTER — Emergency Department (HOSPITAL_COMMUNITY)
Admission: EM | Admit: 2013-09-27 | Discharge: 2013-09-27 | Disposition: A | Discharge status: Home Health | Payer: Medicare Other | Attending: Emergency Medicine | Admitting: Emergency Medicine

## 2013-09-27 ENCOUNTER — Encounter (HOSPITAL_COMMUNITY): Payer: Self-pay | Admitting: Emergency Medicine

## 2013-09-27 DIAGNOSIS — R5383 Other fatigue: Secondary | ICD-10-CM | POA: Insufficient documentation

## 2013-09-27 DIAGNOSIS — Z87448 Personal history of other diseases of urinary system: Secondary | ICD-10-CM | POA: Insufficient documentation

## 2013-09-27 DIAGNOSIS — R5381 Other malaise: Secondary | ICD-10-CM | POA: Insufficient documentation

## 2013-09-27 DIAGNOSIS — M25519 Pain in unspecified shoulder: Secondary | ICD-10-CM | POA: Insufficient documentation

## 2013-09-27 DIAGNOSIS — Z87891 Personal history of nicotine dependence: Secondary | ICD-10-CM | POA: Insufficient documentation

## 2013-09-27 DIAGNOSIS — Z79899 Other long term (current) drug therapy: Secondary | ICD-10-CM | POA: Insufficient documentation

## 2013-09-27 DIAGNOSIS — I509 Heart failure, unspecified: Secondary | ICD-10-CM | POA: Insufficient documentation

## 2013-09-27 DIAGNOSIS — M1990 Unspecified osteoarthritis, unspecified site: Secondary | ICD-10-CM | POA: Insufficient documentation

## 2013-09-27 DIAGNOSIS — Z7982 Long term (current) use of aspirin: Secondary | ICD-10-CM | POA: Insufficient documentation

## 2013-09-27 DIAGNOSIS — R0602 Shortness of breath: Secondary | ICD-10-CM

## 2013-09-27 DIAGNOSIS — E785 Hyperlipidemia, unspecified: Secondary | ICD-10-CM | POA: Insufficient documentation

## 2013-09-27 DIAGNOSIS — I10 Essential (primary) hypertension: Secondary | ICD-10-CM | POA: Insufficient documentation

## 2013-09-27 HISTORY — DX: Pulmonary hypertension, unspecified: I27.20

## 2013-09-27 LAB — TROPONIN I: Troponin I: <0.3 ng/mL (ref <0.30)

## 2013-09-27 LAB — BASIC METABOLIC PANEL
BUN: 48 mg/dL — ABNORMAL HIGH (ref 6–23)
CHLORIDE: 97 meq/L (ref 96–112)
CO2: 26 mEq/L (ref 19–32)
CREATININE: 1.82 mg/dL — AB (ref 0.50–1.10)
Calcium: 10.6 mg/dL — ABNORMAL HIGH (ref 8.4–10.5)
GFR calc non Af Amer: 26 mL/min — ABNORMAL LOW (ref >90)
GFR, EST AFRICAN AMERICAN: 30 mL/min — AB (ref >90)
Glucose, Bld: 101 mg/dL — ABNORMAL HIGH (ref 70–99)
POTASSIUM: 3.6 meq/L — AB (ref 3.7–5.3)
Sodium: 140 mEq/L (ref 137–147)

## 2013-09-27 LAB — I-STAT TROPONIN, ED: TROPONIN I, POC: 0.02 ng/mL (ref 0.00–0.08)

## 2013-09-27 LAB — CBC
HCT: 37.9 % (ref 36.0–46.0)
Hemoglobin: 12.6 g/dL (ref 12.0–15.0)
MCH: 30.1 pg (ref 26.0–34.0)
MCHC: 33.2 g/dL (ref 30.0–36.0)
MCV: 90.7 fL (ref 78.0–100.0)
PLATELETS: 299 10*3/uL (ref 150–400)
RBC: 4.18 MIL/uL (ref 3.87–5.11)
RDW: 15.3 % (ref 11.5–15.5)
WBC: 7.5 10*3/uL (ref 4.0–10.5)

## 2013-09-27 LAB — D-DIMER, QUANTITATIVE (NOT AT ARMC): D DIMER QUANT: 2.63 ug{FEU}/mL — AB (ref 0.00–0.48)

## 2013-09-27 LAB — PRO B NATRIURETIC PEPTIDE: Pro B Natriuretic peptide (BNP): 1534 pg/mL — ABNORMAL HIGH (ref 0–450)

## 2013-09-27 MED ORDER — TECHNETIUM TC 99M DIETHYLENETRIAME-PENTAACETIC ACID
40.0000 | Freq: Once | INTRAVENOUS | Status: AC | PRN
  Administered 2013-09-27: 40 via RESPIRATORY_TRACT

## 2013-09-27 MED ORDER — TECHNETIUM TO 99M ALBUMIN AGGREGATED
6.0000 | Freq: Once | INTRAVENOUS | Status: AC | PRN
  Administered 2013-09-27: 6 via INTRAVENOUS

## 2013-09-27 NOTE — ED Notes (Signed)
To [facility] via stretcher

## 2013-09-27 NOTE — ED Notes (Signed)
Pt ambulated to restroom with this RN. When patient returned to stretcher, pt reported shortness of breath, stat 85% on room, air RR 30. Within 2 minutes pt relaxed, RR 16, no shortness of breath, sats 96% on room air.

## 2013-09-27 NOTE — Discharge Instructions (Signed)
Return to the ED with any concerns including fever/chills, difficulty breathing, leg swelling, chest pain, fainting, decreased level of alertness/lethargy, or any other alarming symptoms

## 2013-09-27 NOTE — ED Notes (Signed)
Pt discharged to home with family. NAD.

## 2013-09-27 NOTE — ED Provider Notes (Signed)
CSN: [id number]     Arrival date & time 09/27/13  1017 History   First MD Initiated Contact with Patient 09/27/13 1110     Chief Complaint  Patient presents with  . Chest Pain     (Consider location/radiation/quality/duration/timing/severity/associated sxs/prior Treatment) HPI Pt presenting with shortness of breath.  She states that over the past 5 days she has been having pain in left shoulder upper back and left anterior chest.  Also c/o shortness of breath and generalized weakness.  She states the pain has now gone away, yesterday she was feeling better, but today she feels more short of breath and weak than prior.  No leg swelling, no weight gain.  No fever/chills.  No cough.  Sob is worse with exertion.  Pt saw her PMD this week and had normal EKG.  Talked with her [facility] pulmonologist and he recommended coming to the ED.  There are no other associated systemic symptoms, there are no other alleviating or modifying factors.   Past Medical History  Diagnosis Date  . Osteoporosis   . HTN (hypertension)   . Osteoarthritis   . Raynaud disease   . Hypothyroidism   . Heart failure   . Kidney disease   . Hyperlipidemia   . Pulmonary hypertension    Past Surgical History  Procedure Laterality Date  . Cholecystectomy, laparoscopic    . Breast biopsy      x 5   Family History  Problem Relation Age of Onset  . Cancer      husband--esophageal  . Heart disease Mother   . Heart disease Father   . Diabetes Son   . Stroke Daughter    History  Substance Use Topics  . Smoking status: Former Smoker -- 1.50 packs/day for 60 years    Types: Cigarettes    Quit date: 04/25/1987  . Smokeless tobacco: Never Used  . Alcohol Use: No   OB History   Grav Para Term Preterm Abortions TAB SAB Ect Mult Living                 Review of Systems ROS reviewed and all otherwise negative except for mentioned in HPI    Allergies  Ace inhibitors; Codeine; Losartan; and Sulfa antibiotics  Home  Medications   Prior to Admission medications   Medication Sig Start Date End Date Taking? Authorizing Provider  ambrisentan (LETAIRIS) 10 MG tablet Take 5 mg by mouth daily.    Yes Historical Provider, MD  aspirin EC 325 MG tablet Take 325 mg by mouth daily.   Yes Historical Provider, MD  cholecalciferol (VITAMIN D) 1000 UNITS tablet Take 1,000 Units by mouth daily.   Yes Historical Provider, MD  folic acid-pyridoxine-cyancobalamin (FOLTX) 2.5-25-2 MG TABS Take 1 tablet by mouth daily.   Yes Historical Provider, MD  levothyroxine (SYNTHROID, LEVOTHROID) 50 MCG tablet Take 50 mcg by mouth every morning.   Yes Historical Provider, MD  MELATONIN PO Take 1 tablet by mouth at bedtime as needed. For sleep   Yes Historical Provider, MD  Multiple Vitamin (MULTIVITAMIN WITH MINERALS) TABS Take 1 tablet by mouth every morning.    Yes Historical Provider, MD  omeprazole (PRILOSEC OTC) 20 MG tablet Take 1 tablet (20 mg total) by mouth daily. 05/28/12  Yes Sorin June Leap, MD  simvastatin (ZOCOR) 20 MG tablet Take 20 mg by mouth every evening.   Yes Historical Provider, MD  Tadalafil, PAH, (ADCIRCA) 20 MG TABS Take 40 mg by mouth at bedtime.  08/27/13  Yes Historical Provider, MD  torsemide (DEMADEX) 20 MG tablet Take 20 mg by mouth daily.   Yes Historical Provider, MD   BP 122/56  Pulse 69  Temp(Src) 97.9 F (36.6 C) (Oral)  Resp 23  SpO2 90% Vitals reviewed Physical Exam Physical Examination: General appearance - alert, well appearing, and in no distress Mental status - alert, oriented to person, place, and time Eyes - no conjunctival injection, no scleral icterus Mouth - mucous membranes moist, pharynx normal without lesions Chest - clear to auscultation, no wheezes, rales or rhonchi, symmetric air entry, no increased respiratory effort Heart - normal rate, regular rhythm, normal S1, S2, no murmurs, rubs, clicks or gallops Abdomen - soft, nontender, nondistended, no masses or organomegaly Extremities -  peripheral pulses normal, no pedal edema, no clubbing or cyanosis Skin - normal coloration and turgor, no rashes  ED Course  Procedures (including critical care time) Labs Review Labs Reviewed  BASIC METABOLIC PANEL - Abnormal; Notable for the following:    Potassium 3.6 (*)    Glucose, Bld 101 (*)    BUN 48 (*)    Creatinine, Ser 1.82 (*)    Calcium 10.6 (*)    GFR calc non Af Amer 26 (*)    GFR calc Af Amer 30 (*)    All other components within normal limits  PRO B NATRIURETIC PEPTIDE - Abnormal; Notable for the following:    Pro B Natriuretic peptide (BNP) 1534.0 (*)    All other components within normal limits  D-DIMER, QUANTITATIVE - Abnormal; Notable for the following:    D-Dimer, Quant 2.63 (*)    All other components within normal limits  CBC  TROPONIN I  I-STAT TROPOININ, ED    Imaging Review Dg Chest 2 View  09/27/2013   CLINICAL DATA:  Six day history of weakness and shortness of breath  EXAM: CHEST  2 VIEW  COMPARISON:  Prior chest x-ray 08/01/2012; prior CT chest 12/12/2012  FINDINGS: Low inspiratory volumes with increased bibasilar atelectasis. This is superimposed on a background of chronic bronchitic changes, interstitial prominence and emphysema increase cusp acuity of chronic scarring versus atelectasis in the right perihilar region. Trace atherosclerotic calcification again noted in the transverse aorta. Biapical nodular pleural parenchymal scarring without significant interval change. 5 mm nodule in the right upper lung just above the linear scarring remains unchanged. Surgical clips the rubber quadrant from prior cholecystectomy. No acute osseous abnormality.  IMPRESSION: 1. Low inspiratory volumes with mild bibasilar atelectasis. 2. Otherwise, stable chest x-ray with chronic parenchymal disease but no evidence of acute cardiopulmonary process.   Electronically Signed   By: Jacqulynn Cadet M.D.   On: 09/27/2013 13:47   Nm Pulmonary Perf And Vent  09/27/2013    CLINICAL DATA:  Shortness of breath, elevated D-dimer  EXAM: NUCLEAR MEDICINE VENTILATION - PERFUSION LUNG SCAN  TECHNIQUE: Ventilation images were obtained in multiple projections using inhaled aerosol technetium 99 M DTPA. Perfusion images were obtained in multiple projections after intravenous injection of Tc-47mMAA.  RADIOPHARMACEUTICALS:  Forty mCi Tc-960mTPA aerosol and 6 mCi Tc-9944mA  COMPARISON:  Chest x-ray obtained earlier today ; prior chest CT 08/15/2012  FINDINGS: Ventilation: Heterogeneous ventilation with areas of air trapping in the lower lobes. Additionally, there is focal radiotracer uptake in either the trachea or esophagus.  Perfusion: Small wedge-shaped foci of decreased profusion at the periphery of right upper lung, in the left posterior lung base with matching ventilation defects. There is a single moderate defect  in the posterior aspect of the left upper lung with a probable matched defect as well. Overall, the finding of a multiple matched perfusion and ventilation defects is consistent with low probability for pulmonary embolus.  IMPRESSION: 1. Low probability for pulmonary embolus. 2. Heterogeneous ventilation with multiple matched defects most consistent with COPD.   Electronically Signed   By: Jacqulynn Cadet M.D.   On: 09/27/2013 14:55     EKG Interpretation   Date/Time:  Saturday September 27 2013 10:25:54 EDT Ventricular Rate:  81 PR Interval:  142 QRS Duration: 80 QT Interval:  380 QTC Calculation: 441 R Axis:   74 Text Interpretation:  Normal sinus rhythm Normal ECG No significant change  since last tracing Confirmed by [facility]  MD, Kynadee Dam [phone]) on 09/27/2013  4:42:49 PM     4:58 PM pt has had low prob vq scan.  2 sets of negative troponin.  She states her breathing now feels at her baseline.  She was offered admission for further workup but she declines and would prefer to go home and followup with her primary doctor.  I have discussed strict precautions to  return to the ER including or any other new or worsening symptoms. The patient understands and accepts the medical plan as it's been recorded in the chart, and I have answered their questions. Discharge instructions concerning home care and prescriptions have been given. The patient is stable and is discharged to home in good condition.  MDM   Final diagnoses:  Shortness of breath    Pt presenting with c/o increase in her baseline shortness of breath.  Has had chest pain over the past week, but this has been resolved for 2 days.  No fever.  Pt has normal/reassuring exam in the ED.  Vitals are at/near her baseline- mild hypoxia is normal for her per chart review.  Labs reassuring including 2 troponins- doubt ACS as chest pain has been resovled for some time.  No acute ekg abnormalities.  Renal insufficiency at/near her baseline.  VQ scan performed and is low prob for PE.  D/w patient and she declines admissions prefers to be discharged home.  I think this is reasonable given negative workup thus far in the ED.  I have discussed strict precautions to return to the ER including or any other new or worsening symptoms. The patient understands and accepts the medical plan as it's been recorded in the chart, and I have answered their questions. Discharge instructions concerning home care and prescriptions have been given. The patient is stable and is discharged to home in good condition.     Threasa Beards, MD 09/27/13 [id number]

## 2013-09-27 NOTE — ED Notes (Addendum)
Her L chest/shoulder have been hurting since Monday. Yesterday she began to feel SOB and weak. She is alert, mild labored breathing on exertion. She says the pain is gone now

## 2013-09-27 NOTE — ED Notes (Signed)
Pt sent for xray

## 2013-09-29 ENCOUNTER — Telehealth (HOSPITAL_COMMUNITY): Payer: Self-pay

## 2013-09-30 ENCOUNTER — Ambulatory Visit (HOSPITAL_COMMUNITY): Payer: Medicare Other

## 2013-10-02 ENCOUNTER — Ambulatory Visit (HOSPITAL_COMMUNITY): Payer: Medicare Other

## 2013-10-02 ENCOUNTER — Telehealth (HOSPITAL_COMMUNITY): Payer: Self-pay

## 2013-10-07 ENCOUNTER — Encounter (HOSPITAL_COMMUNITY)
Admission: RE | Admit: 2013-10-07 | Discharge: 2013-10-07 | Disposition: A | Discharge status: Home / Self Care | Payer: Medicare Other | Source: Ambulatory Visit | Attending: Internal Medicine | Admitting: Internal Medicine

## 2013-10-07 ENCOUNTER — Ambulatory Visit (HOSPITAL_COMMUNITY): Payer: Medicare Other

## 2013-10-07 DIAGNOSIS — Z5189 Encounter for other specified aftercare: Secondary | ICD-10-CM | POA: Diagnosis not present

## 2013-10-07 NOTE — Progress Notes (Signed)
Meghan Wise completed a Six-Minute Walk Test on 10/07/13 . Meghan Wise walked 792 feet with 0 breaks.  The patient's lowest oxygen saturation was 90% , highest heart rate was 108 bpm , and highest blood pressure was 118/60. The patient was on room air. Meghan Wise stated that shortness of breath hindered their walk test.

## 2013-10-08 ENCOUNTER — Ambulatory Visit
Admission: RE | Admit: 2013-10-08 | Discharge: 2013-10-08 | Disposition: A | Discharge status: Home / Self Care | Payer: 59 | Source: Ambulatory Visit | Attending: Family Medicine | Admitting: Family Medicine

## 2013-10-08 DIAGNOSIS — N6320 Unspecified lump in the left breast, unspecified quadrant: Secondary | ICD-10-CM

## 2013-10-09 ENCOUNTER — Ambulatory Visit (HOSPITAL_COMMUNITY): Payer: Medicare Other

## 2013-10-09 ENCOUNTER — Encounter (HOSPITAL_COMMUNITY)
Admission: RE | Admit: 2013-10-09 | Discharge: 2013-10-09 | Disposition: A | Discharge status: Home / Self Care | Payer: Medicare Other | Source: Ambulatory Visit | Attending: Internal Medicine | Admitting: Internal Medicine

## 2013-10-09 DIAGNOSIS — Z5189 Encounter for other specified aftercare: Secondary | ICD-10-CM | POA: Diagnosis not present

## 2013-10-09 NOTE — Progress Notes (Signed)
Today, Meghan Wise exercised at [employer]. Service time was from 10:30am to 12:15pm.  The patient exercised for more than 31 minutes performing aerobic, strengthening, and stretching exercises. Oxygen saturation, heart rate, blood pressure, rate of perceived exertion, and shortness of breath were all monitored before, during, and after exercise. Jourdin presented with no problems at today's exercise session. The patient attended Pulmonary Education Class today on the topic of Medications with [facility].    There was no workload change during today's exercise session.  Pre-exercise vitals:   Weight kg: 51.4   Liters of O2: ra   SpO2: 95   HR: 82   BP: 112/56   CBG: na  Exercise vitals:   Highest heartrate:  96   Lowest oxygen saturation: 96   Highest blood pressure: 118/60   Liters of 02: na  Post-exercise vitals:   SpO2: 94   HR: 79   BP: 100/60   Liters of O2: ra   CBG: na  Dr. Brand Males, Medical Director Dr. Dyann Kief is immediately available during today's Pulmonary Rehab session for Meghan Wise on 10/09/13 at 10:30am class time.

## 2013-10-14 ENCOUNTER — Encounter (HOSPITAL_COMMUNITY)
Admission: RE | Admit: 2013-10-14 | Discharge: 2013-10-14 | Disposition: A | Discharge status: Home / Self Care | Payer: Medicare Other | Source: Ambulatory Visit | Attending: Internal Medicine | Admitting: Internal Medicine

## 2013-10-14 ENCOUNTER — Ambulatory Visit (HOSPITAL_COMMUNITY): Payer: Medicare Other

## 2013-10-14 DIAGNOSIS — Z5189 Encounter for other specified aftercare: Secondary | ICD-10-CM | POA: Diagnosis not present

## 2013-10-14 NOTE — Progress Notes (Signed)
Today, [name] exercised at [employer]. Service time was from 10:30 to 12:30.  The patient exercised for more than 31 minutes performing aerobic, strengthening, and stretching exercises. Oxygen saturation, heart rate, blood pressure, rate of perceived exertion, and shortness of breath were all monitored before, during, and after exercise. [name] presented with no problems at today's exercise session.   There was an increase workload change during today's exercise session.  Pre-exercise vitals:   Weight kg: 51.8   Liters of O2: ra   SpO2: 93   HR: 77   BP: 106/64   CBG: na  Exercise vitals:   Highest heartrate:  104   Lowest oxygen saturation: 90   Highest blood pressure: 106/58   Liters of 02: ra  Post-exercise vitals:   SpO2: 95   HR: 76   BP: 100/60   Liters of O2: ra   CBG: na  Dr. Brand Males, Medical Director Dr. Dyann Kief is immediately available during today's Pulmonary Rehab session for Meghan Wise on 10/14/13 at 10:30am class time.

## 2013-10-16 ENCOUNTER — Encounter (HOSPITAL_COMMUNITY)
Admission: RE | Admit: 2013-10-16 | Discharge: 2013-10-16 | Disposition: A | Discharge status: Home / Self Care | Payer: Medicare Other | Source: Ambulatory Visit | Attending: Internal Medicine | Admitting: Internal Medicine

## 2013-10-16 ENCOUNTER — Ambulatory Visit (HOSPITAL_COMMUNITY): Payer: Medicare Other

## 2013-10-16 DIAGNOSIS — Z5189 Encounter for other specified aftercare: Secondary | ICD-10-CM | POA: Diagnosis not present

## 2013-10-16 NOTE — Progress Notes (Signed)
Today, Sinahi exercised at [employer]. Service time was from 1303 to 1215.  The patient exercised for more than 31 minutes performing aerobic, strengthening, and stretching exercises. Oxygen saturation, heart rate, blood pressure, rate of perceived exertion, and shortness of breath were all monitored before, during, and after exercise. Makina presented with no problems at today's exercise session. Jem also attended an education session on stress reduction and energy conservation.  There was no workload change during today's exercise session.  Pre-exercise vitals:   Weight kg: 52.0   Liters of O2: ra   SpO2: 94   HR: 81   BP: 96/52   CBG: na  Exercise vitals:   Highest heartrate:  95   Lowest oxygen saturation: 91   Highest blood pressure: 120/60   Liters of 02: ra  Post-exercise vitals:   SpO2: 95   HR: 69   BP: 104/52   Liters of O2: ra   CBG: na  Dr. Brand Males, Medical Director Dr. Aileen Fass is immediately available during today's Pulmonary Rehab session for NAUTICA HOTZ on 10/16/2013 at 1030 class time.

## 2013-10-21 ENCOUNTER — Ambulatory Visit (HOSPITAL_COMMUNITY): Payer: Medicare Other

## 2013-10-21 ENCOUNTER — Encounter (HOSPITAL_COMMUNITY)
Admission: RE | Admit: 2013-10-21 | Discharge: 2013-10-21 | Disposition: A | Discharge status: Home / Self Care | Payer: Medicare Other | Source: Ambulatory Visit | Attending: Internal Medicine | Admitting: Internal Medicine

## 2013-10-21 DIAGNOSIS — Z5189 Encounter for other specified aftercare: Secondary | ICD-10-CM | POA: Diagnosis not present

## 2013-10-21 NOTE — Progress Notes (Signed)
Today, [name] exercised at [employer]. Service time was from 10:30am to 12:15pm.  The patient exercised for more than 31 minutes performing aerobic, strengthening, and stretching exercises. Oxygen saturation, heart rate, blood pressure, rate of perceived exertion, and shortness of breath were all monitored before, during, and after exercise. Meghan Wise presented with no problems at today's exercise session.   There was no workload change during today's exercise session.  Pre-exercise vitals:   Weight kg: 52.0   Liters of O2: ra   SpO2: 99   HR: 78   BP: 104/60   CBG: na  Exercise vitals:   Highest heartrate:  98   Lowest oxygen saturation: 91   Highest blood pressure: 102/50   Liters of 02: ra  Post-exercise vitals:   SpO2: 95   HR: 79   BP: 122/60   Liters of O2: ra   CBG: na  Dr. Brand Males, Medical Director Dr. Aileen Fass is immediately available during today's Pulmonary Rehab session for [name] on 10/21/13 at 10:30am class time.

## 2013-10-23 ENCOUNTER — Ambulatory Visit (HOSPITAL_COMMUNITY): Payer: Medicare Other

## 2013-10-23 ENCOUNTER — Encounter (HOSPITAL_COMMUNITY)
Admission: RE | Admit: 2013-10-23 | Discharge: 2013-10-23 | Disposition: A | Discharge status: Home / Self Care | Payer: Medicare Other | Source: Ambulatory Visit | Attending: Internal Medicine | Admitting: Internal Medicine

## 2013-10-23 DIAGNOSIS — Z5189 Encounter for other specified aftercare: Secondary | ICD-10-CM | POA: Insufficient documentation

## 2013-10-23 DIAGNOSIS — I10 Essential (primary) hypertension: Secondary | ICD-10-CM | POA: Diagnosis not present

## 2013-10-23 DIAGNOSIS — I5033 Acute on chronic diastolic (congestive) heart failure: Secondary | ICD-10-CM | POA: Diagnosis present

## 2013-10-23 NOTE — Progress Notes (Signed)
Today, Meghan Wise exercised at [employer]. Service time was from 10:30am to 12:30pm.  The patient exercised for more than 31 minutes performing aerobic, strengthening, and stretching exercises. Oxygen saturation, heart rate, blood pressure, rate of perceived exertion, and shortness of breath were all monitored before, during, and after exercise. Meghan Wise presented with no problems at today's exercise session. Today the patient attended the education class "Oxygen Use and Safety" with Trish Fountain.  There was no workload change during today's exercise session.  Pre-exercise vitals:   Weight kg: 52.8   Liters of O2: ra   SpO2: 93   HR: 78   BP: 122/52   CBG: na  Exercise vitals:   Highest heartrate:  82   Lowest oxygen saturation: 98   Highest blood pressure: 126/70   Liters of 02: ra  Post-exercise vitals:   SpO2: 91   HR: 68   BP: 108/56   Liters of O2: ra   CBG: na  Dr. Brand Males, Medical Director Dr. Dyann Kief is immediately available during today's Pulmonary Rehab session for Meghan Wise on 10:30am at 10/23/13 class time.

## 2013-10-28 ENCOUNTER — Ambulatory Visit (HOSPITAL_COMMUNITY): Payer: Medicare Other

## 2013-10-28 ENCOUNTER — Encounter (HOSPITAL_COMMUNITY)
Admission: RE | Admit: 2013-10-28 | Discharge: 2013-10-28 | Disposition: A | Discharge status: Home / Self Care | Payer: Medicare Other | Source: Ambulatory Visit | Attending: Internal Medicine | Admitting: Internal Medicine

## 2013-10-28 DIAGNOSIS — Z5189 Encounter for other specified aftercare: Secondary | ICD-10-CM | POA: Diagnosis not present

## 2013-10-28 NOTE — Progress Notes (Signed)
Today, Meghan Wise exercised at [employer]. Service time was from 1030 to 1215.  The patient exercised for more than 31 minutes performing aerobic, strengthening, and stretching exercises. Oxygen saturation, heart rate, blood pressure, rate of perceived exertion, and shortness of breath were all monitored before, during, and after exercise. Meghan Wise presented with no problems at today's exercise session.   There was one workload change during today's exercise session.  Pre-exercise vitals:   Weight kg: 52.0   Liters of O2: RA   SpO2: 98   HR: 82   BP: 100/56   CBG: NA  Exercise vitals:   Highest heartrate:  107   Lowest oxygen saturation: 94   Highest blood pressure: 118/64   Liters of 02: RA  Post-exercise vitals:   SpO2: 99   HR: 65   BP: 124/60   Liters of O2: RA   CBG: NA Dr. Brand Males, Medical Director Dr. Dyann Kief is immediately available during today's Pulmonary Rehab session for Meghan Wise on 10/28/2013 at 1030 class time.

## 2013-10-28 NOTE — Progress Notes (Signed)
I have reviewed a Home Exercise Prescription with Meghan Wise . Meghan Wise is currently exercising at home.  The patient was advised to walk 3-4 days a week for 20 minutes.  Meghan Wise and I discussed how to progress their exercise prescription.  The patient stated that their goals were to be able to walk one mile on a daily basis.  The patient stated that they understand the exercise prescription.  We reviewed exercise guidelines, target heart rate during exercise, oxygen use, weather, home pulse oximeter, endpoints for exercise, and goals.  Patient is encouraged to come to me with any questions. I will continue to follow up with the patient to assist them with progression and safety.

## 2013-10-30 ENCOUNTER — Encounter (HOSPITAL_COMMUNITY)
Admission: RE | Admit: 2013-10-30 | Discharge: 2013-10-30 | Disposition: A | Discharge status: Home / Self Care | Payer: Medicare Other | Source: Ambulatory Visit | Attending: Internal Medicine | Admitting: Internal Medicine

## 2013-10-30 ENCOUNTER — Ambulatory Visit (HOSPITAL_COMMUNITY): Payer: Medicare Other

## 2013-10-30 DIAGNOSIS — Z5189 Encounter for other specified aftercare: Secondary | ICD-10-CM | POA: Diagnosis not present

## 2013-10-30 NOTE — Progress Notes (Signed)
Today, Meghan Wise exercised at [employer]. Service time was from 1100 to 1230.  The patient exercised for more than 31 minutes performing aerobic, strengthening, and stretching exercises. Oxygen saturation, heart rate, blood pressure, rate of perceived exertion, and shortness of breath were all monitored before, during, and after exercise. [name] presented with no problems at today's exercise session. Patient attended M.D. lecture regarding pulmonary diseases.   There was one workload change during today's exercise session.  Pre-exercise vitals:   Weight kg: 51.8   Liters of O2: RA   SpO2: 95   HR: 90   BP: 104/58   CBG: NA  Exercise vitals:   Highest heartrate:  97   Lowest oxygen saturation: 94   Highest blood pressure: 116/58   Liters of 02: RA  Post-exercise vitals:   SpO2: 96   HR: 81   BP: 96/54   Liters of O2: RA   CBG: NA Dr. Brand Males, Medical Director Dr. Aileen Fass is immediately available during today's Pulmonary Rehab session for Meghan Wise on 10/30/2013 at 1030 class time.

## 2013-11-04 ENCOUNTER — Ambulatory Visit (HOSPITAL_COMMUNITY): Payer: Medicare Other

## 2013-11-04 ENCOUNTER — Encounter (HOSPITAL_COMMUNITY)
Admission: RE | Admit: 2013-11-04 | Discharge: 2013-11-04 | Disposition: A | Discharge status: Home / Self Care | Payer: Medicare Other | Source: Ambulatory Visit | Attending: Internal Medicine | Admitting: Internal Medicine

## 2013-11-04 DIAGNOSIS — Z5189 Encounter for other specified aftercare: Secondary | ICD-10-CM | POA: Diagnosis not present

## 2013-11-04 NOTE — Progress Notes (Signed)
Today, Michalle exercised at [employer]. Service time was from 1030 to 1230.  The patient exercised for more than 31 minutes performing aerobic, strengthening, and stretching exercises. Oxygen saturation, heart rate, blood pressure, rate of perceived exertion, and shortness of breath were all monitored before, during, and after exercise. Meghan Wise presented with no problems at today's exercise session.   There was no workload change during today's exercise session.  Pre-exercise vitals:   Weight kg: 52.3   Liters of O2: ra   SpO2: 96   HR: 80   BP: 104/62   CBG: na  Exercise vitals:   Highest heartrate:  92   Lowest oxygen saturation: 94   Highest blood pressure: 112/60   Liters of 02: ra  Post-exercise vitals:   SpO2: 96   HR: 69   BP: 122/60   Liters of O2: ra   CBG: na  Dr. Brand Males, Medical Director Dr. Aileen Fass is immediately available during today's Pulmonary Rehab session for ZYKERIA LAGUARDIA on 11/04/2013 at 1030 class time.

## 2013-11-06 ENCOUNTER — Ambulatory Visit (HOSPITAL_COMMUNITY): Payer: Medicare Other

## 2013-11-06 ENCOUNTER — Encounter (HOSPITAL_COMMUNITY)
Admission: RE | Admit: 2013-11-06 | Discharge: 2013-11-06 | Disposition: A | Discharge status: Home / Self Care | Payer: Medicare Other | Source: Ambulatory Visit | Attending: Internal Medicine | Admitting: Internal Medicine

## 2013-11-06 DIAGNOSIS — Z5189 Encounter for other specified aftercare: Secondary | ICD-10-CM | POA: Diagnosis not present

## 2013-11-06 NOTE — Progress Notes (Signed)
Nutrition Note Spoke with pt. Pt has been following a 2 gm sodium diet per her "kidney doctor's recommendation." Pt is watching sodium and looking at food labels appropriately per conversation. Pt requested some information re: low-sodium cookbooks. Pt given a list of suggested low-sodium cookbooks she may try. Pt expressed understanding of the information reviewed. Continue client-centered nutrition education by RD as part of interdisciplinary care.  Monitor and evaluate progress toward nutrition goal with team.  Derek Mound, M.Ed, RD, LDN, CDE 11/06/2013 12:33 PM

## 2013-11-06 NOTE — Progress Notes (Signed)
Today, Caitlynn exercised at [employer]. Service time was from 1030 to 1230.  The patient exercised for more than 31 minutes performing aerobic, strengthening, and stretching exercises. Oxygen saturation, heart rate, blood pressure, rate of perceived exertion, and shortness of breath were all monitored before, during, and after exercise. Sanvi presented with no problems at today's exercise session. Dorotha also attended an education session on Nutrition for the pulmonary patient.  There was a workload change during today's exercise session.  Pre-exercise vitals:   Weight kg: 51.9   Liters of O2: ra   SpO2: 95   HR: 97   BP: 122/67   CBG: na  Exercise vitals:   Highest heartrate:  111   Lowest oxygen saturation: 92   Highest blood pressure: 110/60   Liters of 02: ra  Post-exercise vitals:   SpO2: 96   HR: 82   BP: 114/60   Liters of O2: ra   CBG: na  Dr. Brand Males, Medical Director Dr. Aileen Fass is immediately available during today's Pulmonary Rehab session for BRITTANEE GHAZARIAN on 11/06/2013 at 1030 class time.

## 2013-11-11 ENCOUNTER — Ambulatory Visit (HOSPITAL_COMMUNITY): Payer: Medicare Other

## 2013-11-11 ENCOUNTER — Encounter (HOSPITAL_COMMUNITY)
Admission: RE | Admit: 2013-11-11 | Discharge: 2013-11-11 | Disposition: A | Discharge status: Home / Self Care | Payer: Medicare Other | Source: Ambulatory Visit | Attending: Internal Medicine | Admitting: Internal Medicine

## 2013-11-11 DIAGNOSIS — Z5189 Encounter for other specified aftercare: Secondary | ICD-10-CM | POA: Diagnosis not present

## 2013-11-11 NOTE — Progress Notes (Signed)
Today, [name] exercised at [employer]. Service time was from 1030 to 1515.  The patient exercised for more than 31 minutes performing aerobic, strengthening, and stretching exercises. Oxygen saturation, heart rate, blood pressure, rate of perceived exertion, and shortness of breath were all monitored before, during, and after exercise. Meghan Wise presented with no problems at today's exercise session.   There was a workload change during today's exercise session.  Pre-exercise vitals:   Weight kg: 52.0   Liters of O2: ra   SpO2: 95   HR: 89   BP: 110/74   CBG: na  Exercise vitals:   Highest heartrate:  104   Lowest oxygen saturation: 90   Highest blood pressure: 126/64   Liters of 02: ra  Post-exercise vitals:   SpO2: 98   HR: 78   BP: 110/74   Liters of O2: ra   CBG: na  Dr. Brand Males, Medical Director Dr. Maryland Pink is immediately available during today's Pulmonary Rehab session for Meghan Wise on 11/11/2013 at 1030 class time.

## 2013-11-13 ENCOUNTER — Encounter (HOSPITAL_COMMUNITY)
Admission: RE | Admit: 2013-11-13 | Discharge: 2013-11-13 | Disposition: A | Discharge status: Home / Self Care | Payer: Medicare Other | Source: Ambulatory Visit | Attending: Internal Medicine | Admitting: Internal Medicine

## 2013-11-13 ENCOUNTER — Ambulatory Visit (HOSPITAL_COMMUNITY): Payer: Medicare Other

## 2013-11-13 DIAGNOSIS — Z5189 Encounter for other specified aftercare: Secondary | ICD-10-CM | POA: Diagnosis not present

## 2013-11-13 NOTE — Progress Notes (Signed)
Today, Meghan Wise exercised at [facility]. Service time was from 10:30 to 12:30.  The patient exercised for more than 31 minutes performing aerobic, strengthening, and stretching exercises. Oxygen saturation, heart rate, blood pressure, rate of perceived exertion, and shortness of breath were all monitored before, during, and after exercise. Meghan Wise presented with no problems at today's exercise session. The patient attended Exercise for the Pulmonary Patient education class today.  There was no workload change during today's exercise session.  Pre-exercise vitals:   Weight kg: 52.3   Liters of O2: ra   SpO2: 99   HR: 77   BP: 88/56 recheck: 128/73   CBG: na  Exercise vitals:   Highest heartrate:  91   Lowest oxygen saturation: 93   Highest blood pressure: 108/40   Liters of 02: ra  Post-exercise vitals:   SpO2: 96   HR: 83   BP: 112/60   Liters of O2: ra   CBG: na  Dr. Brand Males, Medical Director Dr. Carles Collet is immediately available during today's Pulmonary Rehab session for Meghan Wise on 11/13/13 at 10:30am class time.

## 2013-11-18 ENCOUNTER — Ambulatory Visit (HOSPITAL_COMMUNITY): Payer: Medicare Other

## 2013-11-18 ENCOUNTER — Encounter (HOSPITAL_COMMUNITY)
Admission: RE | Admit: 2013-11-18 | Discharge: 2013-11-18 | Disposition: A | Discharge status: Home / Self Care | Payer: Medicare Other | Source: Ambulatory Visit | Attending: Internal Medicine | Admitting: Internal Medicine

## 2013-11-18 DIAGNOSIS — Z5189 Encounter for other specified aftercare: Secondary | ICD-10-CM | POA: Diagnosis not present

## 2013-11-18 NOTE — Progress Notes (Signed)
Today, Meghan Wise exercised at [facility]. Service time was from 1030 to 1200.  The patient exercised for more than 31 minutes performing aerobic, strengthening, and stretching exercises. Oxygen saturation, heart rate, blood pressure, rate of perceived exertion, and shortness of breath were all monitored before, during, and after exercise. Meghan Wise presented with no problems at today's exercise session.   There was no workload change during today's exercise session.  Pre-exercise vitals:   Weight kg: 52.0   Liters of O2: RA   SpO2: 95   HR: 86   BP: 116/60   CBG: NA  Exercise vitals:   Highest heartrate:  98   Lowest oxygen saturation: 88   Highest blood pressure: 124/64   Liters of 02: RA  Post-exercise vitals:   SpO2: 96   HR: 75   BP: 114/73   Liters of O2: RA   CBG: NA Dr. Brand Males, Medical Director Dr. Carles Collet is immediately available during today's Pulmonary Rehab session for Meghan Wise on 11/18/2013 at 1030 class time.

## 2013-11-20 ENCOUNTER — Encounter (HOSPITAL_COMMUNITY)
Admission: RE | Admit: 2013-11-20 | Discharge: 2013-11-20 | Disposition: A | Discharge status: Home / Self Care | Payer: Medicare Other | Source: Ambulatory Visit | Attending: Internal Medicine | Admitting: Internal Medicine

## 2013-11-20 ENCOUNTER — Ambulatory Visit (HOSPITAL_COMMUNITY): Payer: Medicare Other

## 2013-11-20 DIAGNOSIS — Z5189 Encounter for other specified aftercare: Secondary | ICD-10-CM | POA: Diagnosis not present

## 2013-11-20 NOTE — Progress Notes (Signed)
Today, Erla exercised at [facility]. Service time was from 10:30 to 12:15pm.  The patient exercised for more than 31 minutes performing aerobic, strengthening, and stretching exercises. Oxygen saturation, heart rate, blood pressure, rate of perceived exertion, and shortness of breath were all monitored before, during, and after exercise. Vedika presented with no problems at today's exercise session.   There was no workload change during today's exercise session.  Pre-exercise vitals:   Weight kg: 52.3   Liters of O2: ra   SpO2: 95   HR: 82   BP: 102/50   CBG: na  Exercise vitals:   Highest heartrate:  115   Lowest oxygen saturation: 93   Highest blood pressure: 116/54   Liters of 02: ra  Post-exercise vitals:   SpO2: 98   HR: 76   BP: 102/50   Liters of O2: ra   CBG: na  Dr. Brand Males, Medical Director Dr. Coralyn Pear is immediately available during today's Pulmonary Rehab session for MECCA GUITRON on 11/20/13 at 10:30am class time.

## 2013-11-25 ENCOUNTER — Encounter (HOSPITAL_COMMUNITY)
Admission: RE | Admit: 2013-11-25 | Discharge: 2013-11-25 | Disposition: A | Discharge status: Home / Self Care | Payer: Medicare Other | Source: Ambulatory Visit | Attending: Internal Medicine | Admitting: Internal Medicine

## 2013-11-25 ENCOUNTER — Ambulatory Visit (HOSPITAL_COMMUNITY): Payer: Medicare Other

## 2013-11-25 DIAGNOSIS — Z5189 Encounter for other specified aftercare: Secondary | ICD-10-CM | POA: Diagnosis present

## 2013-11-25 DIAGNOSIS — I5033 Acute on chronic diastolic (congestive) heart failure: Secondary | ICD-10-CM | POA: Insufficient documentation

## 2013-11-25 DIAGNOSIS — I10 Essential (primary) hypertension: Secondary | ICD-10-CM | POA: Insufficient documentation

## 2013-11-25 NOTE — Progress Notes (Signed)
Today, Meghan Wise exercised at [facility]. Service time was from 10:30am to 12:00pm.  The patient exercised for more than 31 minutes performing aerobic, strengthening, and stretching exercises. Oxygen saturation, heart rate, blood pressure, rate of perceived exertion, and shortness of breath were all monitored before, during, and after exercise. Meghan Wise presented with no problems at today's exercise session.   There was no workload change during today's exercise session.  Pre-exercise vitals:   Weight kg: 52.0   Liters of O2: ra   SpO2: 97   HR: 87   BP: 126/62   CBG: na  Exercise vitals:   Highest heartrate:  118   Lowest oxygen saturation: 97   Highest blood pressure: 120/54   Liters of 02: ra  Post-exercise vitals:   SpO2: 100   HR: 82   BP: 100/60   Liters of O2: ra   CBG: na  Dr. Brand Males, Medical Director Dr. Coralyn Pear is immediately available during today's Pulmonary Rehab session for Meghan Wise on 11/25/13 at 10:30am class time.

## 2013-11-27 ENCOUNTER — Encounter (HOSPITAL_COMMUNITY)
Admission: RE | Admit: 2013-11-27 | Discharge: 2013-11-27 | Disposition: A | Discharge status: Home / Self Care | Payer: Medicare Other | Source: Ambulatory Visit | Attending: Internal Medicine | Admitting: Internal Medicine

## 2013-11-27 ENCOUNTER — Ambulatory Visit (HOSPITAL_COMMUNITY): Payer: Medicare Other

## 2013-11-27 DIAGNOSIS — Z5189 Encounter for other specified aftercare: Secondary | ICD-10-CM | POA: Diagnosis not present

## 2013-11-27 NOTE — Progress Notes (Signed)
Today, [name] exercised at [facility]. Service time was from 1030 to 1230.  The patient exercised for more than 31 minutes performing aerobic, strengthening, and stretching exercises. Oxygen saturation, heart rate, blood pressure, rate of perceived exertion, and shortness of breath were all monitored before, during, and after exercise. Meghan Wise presented with no problems at today's exercise session. Meghan Wise also attended an education session on [employer].  There was a workload change during today's exercise session.  Pre-exercise vitals:   Weight kg: 51.9   Liters of O2: ra   SpO2: 93   HR: 85   BP: 114/52   CBG: na  Exercise vitals:   Highest heartrate:  117   Lowest oxygen saturation: 90   Highest blood pressure: 122/60   Liters of 02: ra  Post-exercise vitals:   SpO2: 96   HR: 79   BP: 118/62   Liters of O2: ra   CBG: na  Dr. Brand Males, Medical Director Dr. Frederic Jericho is immediately available during today's Pulmonary Rehab session for Meghan Wise on 11/27/2013 at 1030 class time.

## 2013-11-28 ENCOUNTER — Other Ambulatory Visit: Payer: Self-pay | Admitting: Dermatology

## 2013-12-02 ENCOUNTER — Ambulatory Visit (HOSPITAL_COMMUNITY): Payer: Medicare Other

## 2013-12-02 ENCOUNTER — Encounter (HOSPITAL_COMMUNITY)
Admission: RE | Admit: 2013-12-02 | Discharge: 2013-12-02 | Disposition: A | Discharge status: Home / Self Care | Payer: Medicare Other | Source: Ambulatory Visit | Attending: Internal Medicine | Admitting: Internal Medicine

## 2013-12-02 DIAGNOSIS — Z5189 Encounter for other specified aftercare: Secondary | ICD-10-CM | POA: Diagnosis not present

## 2013-12-02 NOTE — Progress Notes (Signed)
Today, Meghan Wise exercised at [facility]. Service time was from 1030 to 1215.  The patient exercised for more than 31 minutes performing aerobic, strengthening, and stretching exercises. Oxygen saturation, heart rate, blood pressure, rate of perceived exertion, and shortness of breath were all monitored before, during, and after exercise. [name] presented with no problems at today's exercise session.   There was no workload change during today's exercise session.  Pre-exercise vitals:   Weight kg: 51.8   Liters of O2: ra   SpO2: 98   HR: 80   BP: 106/60   CBG: na  Exercise vitals:   Highest heartrate:  103   Lowest oxygen saturation: 92   Highest blood pressure: 106/48   Liters of 02: ra  Post-exercise vitals:   SpO2: 93   HR: 78   BP: 114/60   Liters of O2: ra   CBG: na  Dr. Brand Males, Medical Director Dr. Frederic Jericho is immediately available during today's Pulmonary Rehab session for Meghan Wise on 12/02/2013 at 1030 class time.

## 2013-12-04 ENCOUNTER — Encounter (HOSPITAL_COMMUNITY): Payer: Medicare Other

## 2013-12-04 ENCOUNTER — Ambulatory Visit (HOSPITAL_COMMUNITY): Payer: Medicare Other

## 2013-12-04 ENCOUNTER — Telehealth (HOSPITAL_COMMUNITY): Payer: Self-pay | Admitting: Family Medicine

## 2013-12-09 ENCOUNTER — Ambulatory Visit (HOSPITAL_COMMUNITY): Payer: Medicare Other

## 2013-12-09 ENCOUNTER — Encounter (HOSPITAL_COMMUNITY)
Admission: RE | Admit: 2013-12-09 | Discharge: 2013-12-09 | Disposition: A | Discharge status: Home / Self Care | Payer: Medicare Other | Source: Ambulatory Visit | Attending: Internal Medicine | Admitting: Internal Medicine

## 2013-12-09 DIAGNOSIS — Z5189 Encounter for other specified aftercare: Secondary | ICD-10-CM | POA: Diagnosis not present

## 2013-12-09 NOTE — Progress Notes (Signed)
Today, Meghan Wise exercised at [facility]. Service time was from 10:30am to 1200.  The patient exercised for more than 31 minutes performing aerobic, strengthening, and stretching exercises. Oxygen saturation, heart rate, blood pressure, rate of perceived exertion, and shortness of breath were all monitored before, during, and after exercise. [name] presented with no problems at today's exercise session.   There was no workload change during today's exercise session.  Pre-exercise vitals:   Weight kg: 51.3   Liters of O2: ra   SpO2: 97   HR: 83   BP: 98/64   CBG: na  Exercise vitals:   Highest heartrate:  107   Lowest oxygen saturation: 95   Highest blood pressure: 120/52   Liters of 02: ra  Post-exercise vitals:   SpO2: 99   HR: 76   BP: 106/56   Liters of O2: ra   CBG: na  Dr. Brand Males, Medical Director Dr. Maryland Pink is immediately available during today's Pulmonary Rehab session for Meghan Wise on 12/09/13 at 10:30am class time.

## 2013-12-11 ENCOUNTER — Ambulatory Visit (HOSPITAL_COMMUNITY): Payer: Medicare Other

## 2013-12-11 ENCOUNTER — Encounter (HOSPITAL_COMMUNITY)
Admission: RE | Admit: 2013-12-11 | Discharge: 2013-12-11 | Disposition: A | Discharge status: Home / Self Care | Payer: Medicare Other | Source: Ambulatory Visit | Attending: Internal Medicine | Admitting: Internal Medicine

## 2013-12-11 DIAGNOSIS — Z5189 Encounter for other specified aftercare: Secondary | ICD-10-CM | POA: Diagnosis not present

## 2013-12-11 NOTE — Progress Notes (Signed)
Today, Meghan Wise exercised at [facility]. Service time was from 10:30 to 12:30pm.  The patient exercised for more than 31 minutes performing aerobic, strengthening, and stretching exercises. Oxygen saturation, heart rate, blood pressure, rate of perceived exertion, and shortness of breath were all monitored before, during, and after exercise. Meghan Wise presented with no problems at today's exercise session. The patient attended education class with [facility] today regarding oxygen systems.   There was no workload change during today's exercise session.  Pre-exercise vitals:   Weight kg: 51.6   Liters of O2: ra   SpO2: 98   HR: 92   BP: 110/54   CBG: na  Exercise vitals:   Highest heartrate:  98   Lowest oxygen saturation: 95   Highest blood pressure: 104/62   Liters of 02: na  Post-exercise vitals:   SpO2: 96   HR: 76   BP: 108/60   Liters of O2: ra   CBG: na  Dr. Brand Males, Medical Director Dr. Algis Liming is immediately available during today's Pulmonary Rehab session for Meghan Wise on 12/11/13 at 10:30am class time.

## 2013-12-16 ENCOUNTER — Ambulatory Visit (HOSPITAL_COMMUNITY): Payer: Medicare Other

## 2013-12-16 ENCOUNTER — Encounter (HOSPITAL_COMMUNITY)
Admission: RE | Admit: 2013-12-16 | Discharge: 2013-12-16 | Disposition: A | Discharge status: Home / Self Care | Payer: Medicare Other | Source: Ambulatory Visit | Attending: Internal Medicine | Admitting: Internal Medicine

## 2013-12-16 DIAGNOSIS — Z5189 Encounter for other specified aftercare: Secondary | ICD-10-CM | POA: Diagnosis not present

## 2013-12-16 NOTE — Progress Notes (Signed)
Today, Neriah exercised at [facility]. Service time was from 1030 to 1215.  The patient exercised for more than 31 minutes performing aerobic, strengthening, and stretching exercises. Oxygen saturation, heart rate, blood pressure, rate of perceived exertion, and shortness of breath were all monitored before, during, and after exercise. Jonalyn presented with no problems at today's exercise session.   There was no workload change during today's exercise session.  Pre-exercise vitals:   Weight kg: 50.7   Liters of O2: ra   SpO2: 100   HR: 93   BP: 110/60   CBG: na  Exercise vitals:   Highest heartrate:  120   Lowest oxygen saturation: 97   Highest blood pressure: 112/60   Liters of 02: ra  Post-exercise vitals:   SpO2: 97   HR: 82   BP: 112/62   Liters of O2: ra   CBG: na  Dr. Brand Males, Medical Director Dr. Algis Liming is immediately available during today's Pulmonary Rehab session for JANY BUCKWALTER on 12/16/2013 at 1030 class time.

## 2013-12-18 ENCOUNTER — Ambulatory Visit (HOSPITAL_COMMUNITY): Payer: Medicare Other

## 2013-12-18 ENCOUNTER — Encounter (HOSPITAL_COMMUNITY)
Admission: RE | Admit: 2013-12-18 | Discharge: 2013-12-18 | Disposition: A | Discharge status: Home / Self Care | Payer: Medicare Other | Source: Ambulatory Visit | Attending: Internal Medicine | Admitting: Internal Medicine

## 2013-12-18 DIAGNOSIS — Z5189 Encounter for other specified aftercare: Secondary | ICD-10-CM | POA: Diagnosis not present

## 2013-12-18 NOTE — Progress Notes (Signed)
Meghan Wise 78 y.o. female Nutrition Note Spoke with pt. Pt is at a normal wt for her height, but is below her UBW/desired wt of 115 lb. Pt wt is down 3.7 lb over the past 2 months. Pt prepares most of her meals at home and reports she has been trying to slow/stop wt loss without success. Pt is making healthy food choices the majority of the time.  Pt educated re: high calorie diet. Pt states her appetite remains poor, "but I make myself eat anyway." Per pt, "my kidneys are my biggest problem right now." Pt avoids most salty food; "my goal is to consume 500 mg of sodium or less per meal."  Pt does not add salt to food at the table. The role of sodium in lung disease reviewed with pt. Per nutrition screen, pt c/o difficulty chewing/swallowing. Pt states she only has difficulty swallowing rice. Pt states she is being evaluated for possible "Crest Syndrome." Pt expressed understanding of the information reviewed.  Nutrition Diagnosis   Food-and nutrition-related knowledge deficit related to lack of exposure to information as related to diagnosis of pulmonary disease   Increased energy expenditure related to increased energy requirements as evidenced by recent h/o unintended wt loss.    Nutrition Rx/Est. Daily Nutrition Needs for: ? wt gain 1800-2100 Kcal  65-80 gm protein (CKD)   1500 mg or less sodium      Nutrition Intervention   Pt's individual nutrition plan and goals reviewed with pt.   Benefits of adopting healthy eating habits discussed when pt's Rate Your Plate reviewed.   Handout given for High Calorie diet, recipes, and suggestions for increasing calorie intake   Pt to attend the Nutrition and Lung Disease class   Continual client-centered nutrition education by RD, as part of interdisciplinary care. Goal(s) 1. The pt will recognize symptoms that can interfere with adequate oral intake, such as shortness of breath, N/V, early satiety, fatigue, ability to secure and prepare food, taste  and smell changes, chewing/swallowing difficulties, and/ or pain when eating. 2. The pt will consume high-energy, high-nutrient dense beverages when necessary to compensate for decreased oral intake of solid foods. 3. Identify food quantities necessary to achieve wt gain at graduation from pulmonary rehab. Monitor and Evaluate progress toward nutrition goal with team.   Derek Mound, M.Ed, RD, LDN, CDE 12/18/2013 12:14 PM

## 2013-12-18 NOTE — Progress Notes (Signed)
Today, Meghan Wise exercised at [facility]. Service time was from 1100 to 1230.  The patient exercised for more than 31 minutes performing aerobic, strengthening, and stretching exercises. Oxygen saturation, heart rate, blood pressure, rate of perceived exertion, and shortness of breath were all monitored before, during, and after exercise. Meghan Wise presented with no problems at today's exercise session.   There was no workload change during today's exercise session.  Pre-exercise vitals:   Weight kg: 51.3   Liters of O2: ra   SpO2: 100   HR: 84   BP: 114/56   CBG: na  Exercise vitals:   Highest heartrate:  120   Lowest oxygen saturation: 97   Highest blood pressure: 126/70   Liters of 02: ra  Post-exercise vitals:   SpO2: 99   HR: 88   BP: 122/62   Liters of O2: ra   CBG: na  Dr. Brand Males, Medical Director Dr. Broadus John is immediately available during today's Pulmonary Rehab session for Meghan Wise on 12/18/2013 at 1100 class time.

## 2013-12-23 ENCOUNTER — Encounter (HOSPITAL_COMMUNITY)
Admission: RE | Admit: 2013-12-23 | Discharge: 2013-12-23 | Disposition: A | Discharge status: Home / Self Care | Payer: Medicare Other | Source: Ambulatory Visit | Attending: Internal Medicine | Admitting: Internal Medicine

## 2013-12-23 ENCOUNTER — Ambulatory Visit (HOSPITAL_COMMUNITY): Payer: Medicare Other

## 2013-12-23 DIAGNOSIS — I10 Essential (primary) hypertension: Secondary | ICD-10-CM | POA: Diagnosis not present

## 2013-12-23 DIAGNOSIS — I5033 Acute on chronic diastolic (congestive) heart failure: Secondary | ICD-10-CM | POA: Insufficient documentation

## 2013-12-23 DIAGNOSIS — Z5189 Encounter for other specified aftercare: Secondary | ICD-10-CM | POA: Insufficient documentation

## 2013-12-23 NOTE — Progress Notes (Signed)
Today, [name] exercised at [facility]. Service time was from 1030 to 1215.  The patient exercised for more than 31 minutes performing aerobic, strengthening, and stretching exercises. Oxygen saturation, heart rate, blood pressure, rate of perceived exertion, and shortness of breath were all monitored before, during, and after exercise. [name] presented with no problems at today's exercise session.   There was no workload change during today's exercise session.  Pre-exercise vitals:   Weight kg: 51.2   Liters of O2: RA   SpO2: 96   HR: 83   BP: 98/68   CBG: NA  Exercise vitals:   Highest heartrate:  109   Lowest oxygen saturation: 91   Highest blood pressure: 130/60   Liters of 02: RA  Post-exercise vitals:   SpO2: 100   HR: 83   BP: 112/58   Liters of O2: RA   CBG: NA Dr. Brand Males, Medical Director Dr. Broadus John is immediately available during today's Pulmonary Rehab session for Meghan Wise on 12/23/2013 at 1030 class time.

## 2013-12-25 ENCOUNTER — Ambulatory Visit (HOSPITAL_COMMUNITY): Payer: Medicare Other

## 2013-12-25 ENCOUNTER — Encounter (HOSPITAL_COMMUNITY)
Admission: RE | Admit: 2013-12-25 | Discharge: 2013-12-25 | Disposition: A | Discharge status: Home / Self Care | Payer: Medicare Other | Source: Ambulatory Visit | Attending: Internal Medicine | Admitting: Internal Medicine

## 2013-12-25 DIAGNOSIS — Z5189 Encounter for other specified aftercare: Secondary | ICD-10-CM | POA: Diagnosis not present

## 2013-12-25 NOTE — Progress Notes (Signed)
Today, [name] exercised at [facility]. Service time was from 1030 to 1230.  The patient exercised for more than 31 minutes performing aerobic, strengthening, and stretching exercises. Oxygen saturation, heart rate, blood pressure, rate of perceived exertion, and shortness of breath were all monitored before, during, and after exercise. [name] presented with no problems at today's exercise session.  Patient attended the oxygen safety class today.   There was no workload change during today's exercise session.  Pre-exercise vitals:   Weight kg: 51.7   Liters of O2: RA   SpO2: 96   HR: 82   BP: 98/70   CBG: NA  Exercise vitals:   Highest heartrate:  115   Lowest oxygen saturation: 93   Highest blood pressure: 124/74   Liters of 02: RA  Post-exercise vitals:   SpO2: 96   HR: 82   BP: 116/70   Liters of O2: RA   CBG: NA Dr. Brand Males, Medical Director Dr. Wendee Beavers is immediately available during today's Pulmonary Rehab session for Meghan Wise on 12/25/2013 at 1030 class time.

## 2013-12-30 ENCOUNTER — Ambulatory Visit (HOSPITAL_COMMUNITY): Payer: Medicare Other

## 2013-12-30 ENCOUNTER — Encounter (HOSPITAL_COMMUNITY)
Admission: RE | Admit: 2013-12-30 | Discharge: 2013-12-30 | Disposition: A | Discharge status: Home / Self Care | Payer: Medicare Other | Source: Ambulatory Visit | Attending: Internal Medicine | Admitting: Internal Medicine

## 2013-12-30 DIAGNOSIS — Z5189 Encounter for other specified aftercare: Secondary | ICD-10-CM | POA: Diagnosis not present

## 2013-12-30 NOTE — Progress Notes (Signed)
Today, [name] exercised at [facility]. Service time was from 10:30 to 12:15.  The patient exercised for more than 31 minutes performing aerobic, strengthening, and stretching exercises. Oxygen saturation, heart rate, blood pressure, rate of perceived exertion, and shortness of breath were all monitored before, during, and after exercise. Meghan Wise presented with no problems at today's exercise session.   There was no workload change during today's exercise session.  Pre-exercise vitals:   Weight kg: 51.8   Liters of O2: ra   SpO2: 97   HR: 90   BP: 124/54   CBG: na  Exercise vitals:   Highest heartrate:  115   Lowest oxygen saturation: 87% increased to 91%   Highest blood pressure: 122/54   Liters of 02: ra  Post-exercise vitals:   SpO2: 97   HR: 78   BP: 112/58   Liters of O2: ra   CBG: na  Dr. Brand Males, Medical Director Dr. Wendee Beavers is immediately available during today's Pulmonary Rehab session for Meghan Wise on 12/30/13 at 10:30am class time.

## 2014-01-01 ENCOUNTER — Ambulatory Visit (HOSPITAL_COMMUNITY): Payer: Medicare Other

## 2014-01-01 ENCOUNTER — Encounter (HOSPITAL_COMMUNITY)
Admission: RE | Admit: 2014-01-01 | Discharge: 2014-01-01 | Disposition: A | Discharge status: Home / Self Care | Payer: Medicare Other | Source: Ambulatory Visit | Attending: Internal Medicine | Admitting: Internal Medicine

## 2014-01-01 DIAGNOSIS — Z5189 Encounter for other specified aftercare: Secondary | ICD-10-CM | POA: Diagnosis not present

## 2014-01-01 NOTE — Progress Notes (Signed)
Today, Meghan Wise exercised at [facility]. Service time was from 10:30 to 12:30.  The patient exercised for more than 31 minutes performing aerobic, strengthening, and stretching exercises. Oxygen saturation, heart rate, blood pressure, rate of perceived exertion, and shortness of breath were all monitored before, during, and after exercise. [name] presented with no problems at today's exercise session. The patient attended education class on Nutrition with [facility].  There was no workload change during today's exercise session.  Pre-exercise vitals:   Weight kg: 51.6   Liters of O2: ra   SpO2: 99   HR: 92   BP: 112/60   CBG: na  Exercise vitals:   Highest heartrate:  111   Lowest oxygen saturation: 97   Highest blood pressure: 126/64   Liters of 02: ra  Post-exercise vitals:   SpO2: 98   HR: 90   BP: 106/64   Liters of O2: ra   CBG: na  Dr. Brand Males, Medical Director Dr. Coralyn Pear is immediately available during today's Pulmonary Rehab session for Meghan Wise on 01/01/14 at 10:30am class time.

## 2014-01-06 ENCOUNTER — Encounter (HOSPITAL_COMMUNITY): Payer: Medicare Other

## 2014-01-06 ENCOUNTER — Encounter (HOSPITAL_COMMUNITY)
Admission: RE | Admit: 2014-01-06 | Discharge: 2014-01-06 | Disposition: A | Discharge status: Home / Self Care | Payer: Medicare Other | Source: Ambulatory Visit | Attending: Internal Medicine | Admitting: Internal Medicine

## 2014-01-06 ENCOUNTER — Ambulatory Visit (HOSPITAL_COMMUNITY): Payer: Medicare Other

## 2014-01-06 DIAGNOSIS — Z5189 Encounter for other specified aftercare: Secondary | ICD-10-CM | POA: Diagnosis not present

## 2014-01-06 NOTE — Progress Notes (Signed)
Meghan Wise completed a Six-Minute Walk Test on 01/06/14 . Meghan Wise walked 1,049 feet with 0 breaks.  The patient's lowest oxygen saturation was 89% , highest heart rate was 124 , and highest blood pressure was 148/60. The patient was on room air. [name] stated that nothing hindered their walk test.

## 2014-01-08 ENCOUNTER — Ambulatory Visit (HOSPITAL_COMMUNITY): Payer: Medicare Other

## 2014-01-08 ENCOUNTER — Encounter (HOSPITAL_COMMUNITY): Payer: Medicare Other

## 2014-01-12 ENCOUNTER — Encounter (HOSPITAL_COMMUNITY): Payer: Self-pay | Admitting: Emergency Medicine

## 2014-01-12 ENCOUNTER — Emergency Department (HOSPITAL_COMMUNITY)
Admission: EM | Admit: 2014-01-12 | Discharge: 2014-01-12 | Disposition: A | Discharge status: Home / Self Care | Payer: Medicare Other | Attending: Emergency Medicine | Admitting: Emergency Medicine

## 2014-01-12 DIAGNOSIS — I10 Essential (primary) hypertension: Secondary | ICD-10-CM | POA: Diagnosis not present

## 2014-01-12 DIAGNOSIS — I509 Heart failure, unspecified: Secondary | ICD-10-CM | POA: Diagnosis not present

## 2014-01-12 DIAGNOSIS — R51 Headache: Secondary | ICD-10-CM | POA: Diagnosis present

## 2014-01-12 DIAGNOSIS — Z87891 Personal history of nicotine dependence: Secondary | ICD-10-CM | POA: Insufficient documentation

## 2014-01-12 DIAGNOSIS — I2789 Other specified pulmonary heart diseases: Secondary | ICD-10-CM | POA: Insufficient documentation

## 2014-01-12 DIAGNOSIS — K1370 Unspecified lesions of oral mucosa: Secondary | ICD-10-CM | POA: Diagnosis not present

## 2014-01-12 DIAGNOSIS — K1379 Other lesions of oral mucosa: Secondary | ICD-10-CM

## 2014-01-12 DIAGNOSIS — I76 Septic arterial embolism: Secondary | ICD-10-CM | POA: Diagnosis not present

## 2014-01-12 DIAGNOSIS — Z7982 Long term (current) use of aspirin: Secondary | ICD-10-CM | POA: Diagnosis not present

## 2014-01-12 DIAGNOSIS — E785 Hyperlipidemia, unspecified: Secondary | ICD-10-CM | POA: Insufficient documentation

## 2014-01-12 DIAGNOSIS — Z79899 Other long term (current) drug therapy: Secondary | ICD-10-CM | POA: Insufficient documentation

## 2014-01-12 DIAGNOSIS — M1990 Unspecified osteoarthritis, unspecified site: Secondary | ICD-10-CM | POA: Diagnosis not present

## 2014-01-12 DIAGNOSIS — Z87448 Personal history of other diseases of urinary system: Secondary | ICD-10-CM | POA: Diagnosis not present

## 2014-01-12 MED ORDER — LIDOCAINE-EPINEPHRINE-TETRACAINE (LET) SOLUTION
3.0000 mL | Freq: Once | NASAL | Status: AC
  Administered 2014-01-12: 3 mL via TOPICAL
  Filled 2014-01-12: qty 3

## 2014-01-12 MED ORDER — SILVER NITRATE-POT NITRATE 75-25 % EX MISC
1.0000 | Freq: Once | CUTANEOUS | Status: DC
  Filled 2014-01-12: qty 1

## 2014-01-12 NOTE — ED Provider Notes (Signed)
CSN: [id number]     Arrival date & time 01/12/14  1245 History   First MD Initiated Contact with Patient 01/12/14 1124     Chief Complaint  Patient presents with  . Facial Pain     (Consider location/radiation/quality/duration/timing/severity/associated sxs/prior Treatment) HPI [name] is a 78 year old female past medical history scleroderma, on aspirin presenting with a spontaneous bleed in her lower lip. Patient denies any trauma to the area. Patient states his bleeding has occurred in the past where she has been able to control it with mild pressure. She states today her bleeding has lasted approximately 4 hours, and she is unable to get it to stop herself. Patient denies any syncope, trauma, lightheadedness, dizziness, weakness, nausea, vomiting, shortness of breath.  Past Medical History  Diagnosis Date  . Osteoporosis   . HTN (hypertension)   . Osteoarthritis   . Raynaud disease   . Hypothyroidism   . Heart failure   . Kidney disease   . Hyperlipidemia   . Pulmonary hypertension    Past Surgical History  Procedure Laterality Date  . Cholecystectomy, laparoscopic    . Breast biopsy      x 5   Family History  Problem Relation Age of Onset  . Cancer      husband--esophageal  . Heart disease Mother   . Heart disease Father   . Diabetes Son   . Stroke Daughter    History  Substance Use Topics  . Smoking status: Former Smoker -- 1.50 packs/day for 60 years    Types: Cigarettes    Quit date: 04/25/1987  . Smokeless tobacco: Never Used  . Alcohol Use: No   OB History   Grav Para Term Preterm Abortions TAB SAB Ect Mult Living                 Review of Systems  Constitutional: Negative for fever.  HENT: Negative for nosebleeds.        Bleeding lip  Eyes: Negative for visual disturbance.  Respiratory: Negative for shortness of breath.   Cardiovascular: Negative for chest pain.  Gastrointestinal: Negative for nausea, vomiting and abdominal pain.   Genitourinary: Negative for dysuria.  Skin: Negative for rash.  Neurological: Negative for dizziness, syncope, weakness and numbness.  Psychiatric/Behavioral: Negative.       Allergies  Ace inhibitors; Codeine; Losartan; and Sulfa antibiotics  Home Medications   Prior to Admission medications   Medication Sig Start Date End Date Taking? Authorizing Provider  ambrisentan (LETAIRIS) 10 MG tablet Take 5 mg by mouth daily.     Historical Provider, MD  aspirin EC 325 MG tablet Take 325 mg by mouth daily.    Historical Provider, MD  cholecalciferol (VITAMIN D) 1000 UNITS tablet Take 1,000 Units by mouth daily.    Historical Provider, MD  folic acid-pyridoxine-cyancobalamin (FOLTX) 2.5-25-2 MG TABS Take 1 tablet by mouth daily.    Historical Provider, MD  levothyroxine (SYNTHROID, LEVOTHROID) 50 MCG tablet Take 50 mcg by mouth every morning.    Historical Provider, MD  MELATONIN PO Take 1 tablet by mouth at bedtime as needed. For sleep    Historical Provider, MD  Multiple Vitamin (MULTIVITAMIN WITH MINERALS) TABS Take 1 tablet by mouth every morning.     Historical Provider, MD  omeprazole (PRILOSEC OTC) 20 MG tablet Take 1 tablet (20 mg total) by mouth daily. 05/28/12   Sorin June Leap, MD  simvastatin (ZOCOR) 20 MG tablet Take 20 mg by mouth every evening.  Historical Provider, MD  Tadalafil, PAH, (ADCIRCA) 20 MG TABS Take 40 mg by mouth at bedtime.  08/27/13   Historical Provider, MD  torsemide (DEMADEX) 20 MG tablet Take 20 mg by mouth daily.    Historical Provider, MD   BP 138/45  Pulse 67  Temp(Src) 97.5 F (36.4 C) (Oral)  Resp 16  SpO2 100% Physical Exam  Nursing note and vitals reviewed. Constitutional: She is oriented to person, place, and time. She appears well-developed and well-nourished. No distress.  HENT:  Head: Normocephalic and atraumatic.  Mouth/Throat: Uvula is midline, oropharynx is clear and moist and mucous membranes are normal. Mucous membranes are not pale and not  cyanotic.  Mild, steady bleed noted in the middle inside aspect of her lower lip. No laceration, abrasion, trauma to the area. Bleed is consistent with a venous bleed with spontaneous rupture of a small vein.  Eyes: Right eye exhibits no discharge. Left eye exhibits no discharge. No scleral icterus.  Neck: Normal range of motion.  Pulmonary/Chest: Effort normal. No respiratory distress.  Musculoskeletal: Normal range of motion.  Neurological: She is alert and oriented to person, place, and time.  Skin: Skin is warm and dry. She is not diaphoretic.  Psychiatric: She has a normal mood and affect.    ED Course  Procedures (including critical care time) Labs Review Labs Reviewed - No data to display  Imaging Review No results found.   EKG Interpretation None      MDM   Final diagnoses:  Bleeding in mouth    78 year old female with past medical history of scleroderma, on aspirin here with spontaneous bleed of capillary in lower lip. Patient states the bleeding has occurred spontaneously in the past, however typically she is able to get the bleeding to stop herself. Today she states the bleeding has lasted approximately 4 hours, and she is unable to get it to stop on her own. Bleeding consistent with venous bleed we will try direct pressure, ice, lidocaine with epi to promote vasoconstriction to stop bleed.    ENT consulted for advice as to alternative methods to stop her spontaneous bleed.  ENT recommends cautery with silver nitrate.  While taking silver nitrate in the patient's room, as noted the bleeding had ceased with LET. Silver nitrate placed on patient's lip and area of the bleeder to help insure bleeding would not return after patient knees. We discussed with patient the fact that the bleeding may return spontaneously, and the silver nitrate may not be definitive therapy for it. We encouraged patient to follow up with her dermatologist, as patient states in the past when she has  had this same issue she has gone to the dermatology office. We also gave patient a referral to ENT should she need it. I discussed return precautions with patient, and gave her some basic remedies for active bleeding. Patient was agreeable to this plan. I encouraged patient to call or return to ER should her symptoms persist, worsen or should she have any questions or concerns.   BP 138/45  Pulse 67  Temp(Src) 97.5 F (36.4 C) (Oral)  Resp 16  SpO2 100%   Signed,  Dahlia Bailiff, PA-C 8:40 AM   This patient seen and discussed with Dr. Sherwood Gambler, M.D.   Carrie Mew, PA-C 01/14/14 [phone]

## 2014-01-12 NOTE — ED Notes (Signed)
Pt placed on monitor upon arrival to room. Pt monitored by blood pressure and pulse ox.

## 2014-01-12 NOTE — ED Notes (Signed)
Pt remains monitored by blood pressure and pulse ox.

## 2014-01-12 NOTE — ED Notes (Addendum)
Lower lip started to bleed this am states is on asa bright red blood  Started about 815 feels dizzy

## 2014-01-12 NOTE — Discharge Instructions (Signed)
Followup with dermatology today or tomorrow. If your lip begins to bleed again, place with direct pressure on the source of where the blood is coming from. Continue to hold direct pressure, apply ice, and return to the ER if you cannot get the bleeding to stop. You may also follow up with Dr. Redmond Baseman with otolaryngology.

## 2014-01-13 ENCOUNTER — Encounter (HOSPITAL_COMMUNITY): Payer: Medicare Other

## 2014-01-13 ENCOUNTER — Encounter (HOSPITAL_COMMUNITY)
Admission: RE | Admit: 2014-01-13 | Discharge: 2014-01-13 | Disposition: A | Discharge status: Home / Self Care | Payer: Medicare Other | Source: Ambulatory Visit | Attending: Internal Medicine | Admitting: Internal Medicine

## 2014-01-15 ENCOUNTER — Encounter (HOSPITAL_COMMUNITY): Payer: Medicare Other

## 2014-01-15 NOTE — ED Provider Notes (Signed)
Medical screening examination/treatment/procedure(s) were conducted as a shared visit with non-physician practitioner(s) and myself.  I personally evaluated the patient during the encounter.   EKG Interpretation None       Patient with bleeding from her lower lip. Appears to be due to a vessel from her scleroderma has risen to the surface. Difficult to control the bleeding but after a combination of direct pressure, ice, and viscous lidocaine with epi her bleeding was able to be controlled. I had discussed the case with ENT on call who recommended silver nitrate stick, which was applied. Patient had no more bleeding in the ED and has no symptoms of anemia. Stable for discharge.  Ephraim Hamburger, MD 01/15/14 [phone]

## 2014-01-20 ENCOUNTER — Encounter (HOSPITAL_COMMUNITY): Payer: Medicare Other

## 2014-01-20 ENCOUNTER — Encounter (HOSPITAL_COMMUNITY)
Admission: RE | Admit: 2014-01-20 | Discharge: 2014-01-20 | Disposition: A | Discharge status: Home / Self Care | Payer: Medicare Other | Source: Ambulatory Visit | Attending: Internal Medicine | Admitting: Internal Medicine

## 2014-01-22 ENCOUNTER — Encounter (HOSPITAL_COMMUNITY): Payer: Medicare Other

## 2014-01-22 ENCOUNTER — Encounter (HOSPITAL_COMMUNITY)
Admission: RE | Admit: 2014-01-22 | Discharge: 2014-01-22 | Disposition: A | Discharge status: Home / Self Care | Payer: Self-pay | Source: Ambulatory Visit | Attending: Internal Medicine | Admitting: Internal Medicine

## 2014-01-22 DIAGNOSIS — I10 Essential (primary) hypertension: Secondary | ICD-10-CM | POA: Insufficient documentation

## 2014-01-22 DIAGNOSIS — I5033 Acute on chronic diastolic (congestive) heart failure: Secondary | ICD-10-CM | POA: Insufficient documentation

## 2014-01-22 DIAGNOSIS — Z5189 Encounter for other specified aftercare: Secondary | ICD-10-CM | POA: Insufficient documentation

## 2014-01-27 ENCOUNTER — Encounter (HOSPITAL_COMMUNITY): Payer: Medicare Other

## 2014-01-29 ENCOUNTER — Encounter (HOSPITAL_COMMUNITY)
Admission: RE | Admit: 2014-01-29 | Discharge: 2014-01-29 | Disposition: A | Discharge status: Home / Self Care | Payer: Self-pay | Source: Ambulatory Visit | Attending: Internal Medicine | Admitting: Internal Medicine

## 2014-01-30 ENCOUNTER — Encounter: Payer: Self-pay | Admitting: Gastroenterology

## 2014-02-03 ENCOUNTER — Encounter (HOSPITAL_COMMUNITY)
Admission: RE | Admit: 2014-02-03 | Discharge: 2014-02-03 | Disposition: A | Discharge status: Home / Self Care | Payer: Self-pay | Source: Ambulatory Visit | Attending: Internal Medicine | Admitting: Internal Medicine

## 2014-02-05 ENCOUNTER — Encounter (HOSPITAL_COMMUNITY)
Admission: RE | Admit: 2014-02-05 | Discharge: 2014-02-05 | Disposition: A | Discharge status: Home / Self Care | Payer: Self-pay | Source: Ambulatory Visit | Attending: Internal Medicine | Admitting: Internal Medicine

## 2014-02-10 ENCOUNTER — Encounter (HOSPITAL_COMMUNITY): Payer: Medicare Other

## 2014-02-12 ENCOUNTER — Encounter (HOSPITAL_COMMUNITY)
Admission: RE | Admit: 2014-02-12 | Discharge: 2014-02-12 | Disposition: A | Discharge status: Home / Self Care | Payer: Self-pay | Source: Ambulatory Visit | Attending: Internal Medicine | Admitting: Internal Medicine

## 2014-02-17 ENCOUNTER — Encounter (HOSPITAL_COMMUNITY)
Admission: RE | Admit: 2014-02-17 | Discharge: 2014-02-17 | Disposition: A | Discharge status: Home / Self Care | Payer: Self-pay | Source: Ambulatory Visit | Attending: Internal Medicine | Admitting: Internal Medicine

## 2014-02-19 ENCOUNTER — Encounter (HOSPITAL_COMMUNITY): Payer: Medicare Other

## 2014-02-24 ENCOUNTER — Encounter (HOSPITAL_COMMUNITY)
Admission: RE | Admit: 2014-02-24 | Discharge: 2014-02-24 | Disposition: A | Discharge status: Home / Self Care | Payer: Self-pay | Source: Ambulatory Visit | Attending: Internal Medicine | Admitting: Internal Medicine

## 2014-02-24 DIAGNOSIS — I10 Essential (primary) hypertension: Secondary | ICD-10-CM | POA: Insufficient documentation

## 2014-02-24 DIAGNOSIS — Z5189 Encounter for other specified aftercare: Secondary | ICD-10-CM | POA: Insufficient documentation

## 2014-02-24 DIAGNOSIS — I5033 Acute on chronic diastolic (congestive) heart failure: Secondary | ICD-10-CM | POA: Insufficient documentation

## 2014-02-26 ENCOUNTER — Encounter (HOSPITAL_COMMUNITY): Payer: Self-pay

## 2014-03-03 ENCOUNTER — Encounter (HOSPITAL_COMMUNITY)
Admission: RE | Admit: 2014-03-03 | Discharge: 2014-03-03 | Disposition: A | Discharge status: Home / Self Care | Payer: Self-pay | Source: Ambulatory Visit | Attending: Internal Medicine | Admitting: Internal Medicine

## 2014-03-05 ENCOUNTER — Encounter (HOSPITAL_COMMUNITY)
Admission: RE | Admit: 2014-03-05 | Discharge: 2014-03-05 | Disposition: A | Discharge status: Home / Self Care | Payer: Self-pay | Source: Ambulatory Visit | Attending: Internal Medicine | Admitting: Internal Medicine

## 2014-03-10 ENCOUNTER — Encounter (HOSPITAL_COMMUNITY)
Admission: RE | Admit: 2014-03-10 | Discharge: 2014-03-10 | Disposition: A | Discharge status: Home / Self Care | Payer: Self-pay | Source: Ambulatory Visit | Attending: Internal Medicine | Admitting: Internal Medicine

## 2014-03-12 ENCOUNTER — Encounter (HOSPITAL_COMMUNITY)
Admission: RE | Admit: 2014-03-12 | Discharge: 2014-03-12 | Disposition: A | Discharge status: Home / Self Care | Payer: Self-pay | Source: Ambulatory Visit | Attending: Internal Medicine | Admitting: Internal Medicine

## 2014-03-17 ENCOUNTER — Encounter (HOSPITAL_COMMUNITY): Payer: Medicare Other

## 2014-03-19 ENCOUNTER — Encounter (HOSPITAL_COMMUNITY): Payer: Medicare Other

## 2014-03-24 ENCOUNTER — Encounter (HOSPITAL_COMMUNITY)
Admission: RE | Admit: 2014-03-24 | Discharge: 2014-03-24 | Disposition: A | Discharge status: Home / Self Care | Payer: Self-pay | Source: Ambulatory Visit | Attending: Internal Medicine | Admitting: Internal Medicine

## 2014-03-24 DIAGNOSIS — I5033 Acute on chronic diastolic (congestive) heart failure: Secondary | ICD-10-CM | POA: Insufficient documentation

## 2014-03-24 DIAGNOSIS — I10 Essential (primary) hypertension: Secondary | ICD-10-CM | POA: Insufficient documentation

## 2014-03-24 DIAGNOSIS — Z5189 Encounter for other specified aftercare: Secondary | ICD-10-CM | POA: Insufficient documentation

## 2014-03-26 ENCOUNTER — Encounter (HOSPITAL_COMMUNITY)
Admission: RE | Admit: 2014-03-26 | Discharge: 2014-03-26 | Disposition: A | Discharge status: Home / Self Care | Payer: Self-pay | Source: Ambulatory Visit | Attending: Internal Medicine | Admitting: Internal Medicine

## 2014-03-31 ENCOUNTER — Encounter (HOSPITAL_COMMUNITY)
Admission: RE | Admit: 2014-03-31 | Discharge: 2014-03-31 | Disposition: A | Discharge status: Home / Self Care | Payer: Self-pay | Source: Ambulatory Visit | Attending: Internal Medicine | Admitting: Internal Medicine

## 2014-04-01 ENCOUNTER — Ambulatory Visit (INDEPENDENT_AMBULATORY_CARE_PROVIDER_SITE_OTHER): Payer: Medicare Other | Admitting: Gastroenterology

## 2014-04-01 ENCOUNTER — Encounter: Payer: Self-pay | Admitting: Gastroenterology

## 2014-04-01 VITALS — BP 106/52 | HR 64 | Ht <= 58 in | Wt 111.4 lb

## 2014-04-01 DIAGNOSIS — K219 Gastro-esophageal reflux disease without esophagitis: Secondary | ICD-10-CM

## 2014-04-01 DIAGNOSIS — D649 Anemia, unspecified: Secondary | ICD-10-CM

## 2014-04-01 DIAGNOSIS — R159 Full incontinence of feces: Secondary | ICD-10-CM

## 2014-04-01 DIAGNOSIS — K625 Hemorrhage of anus and rectum: Secondary | ICD-10-CM

## 2014-04-01 DIAGNOSIS — R1314 Dysphagia, pharyngoesophageal phase: Secondary | ICD-10-CM

## 2014-04-01 DIAGNOSIS — N182 Chronic kidney disease, stage 2 (mild): Secondary | ICD-10-CM

## 2014-04-01 MED ORDER — SUCRALFATE 1 GM/10ML PO SUSP: 1.0000 g | Freq: Four times a day (QID) | ORAL | Status: DC

## 2014-04-01 NOTE — Assessment & Plan Note (Signed)
She has a normal rectal exam.  Fecal incontinence may be related to hemorrhoidal disease.  Recommendations #1 due to severe band ligation of hemorrhoids

## 2014-04-01 NOTE — Assessment & Plan Note (Signed)
Etiologies include motility disorder and esophageal stricture  Recommendations #1 upper endoscopy with dilation as indicated

## 2014-04-01 NOTE — Assessment & Plan Note (Signed)
No functions is stable

## 2014-04-01 NOTE — Progress Notes (Signed)
_                                                                                                                History of Present Illness:  [name] is a 78 year old white female with history of scleroderma, renal insufficiency, referred for evaluation of dysphagia, rectal bleeding and stool incontinence.  She has frequent pyrosis which is only partially controlled with ranitidine.  She was told that she cannot take a PPI because of her renal disease.  She has dysphagia to solids and liquids.  She normally moves her bowels 4-5 times a day.  She claims stools are solid but she is unaware of passing small amounts of stool, at times.  She's planes of encopresis and is unaware of passing stool.  In 2013 she underwent an incomplete colonoscopy.  A very redundant colon was noted.  This was followed by an air-contrast barium enema which demonstrated left-sided diverticula.  Colonic redundancy was also seen.  She's had multiple episodes of imminent hematochezia acids of black stools.  She denies rectal discomfort.   Past Medical History  Diagnosis Date  . Osteoporosis   . HTN (hypertension)   . Osteoarthritis   . Raynaud disease   . Hypothyroidism   . Heart failure   . Kidney disease   . Hyperlipidemia   . Pulmonary hypertension   . Scleroderma    Past Surgical History  Procedure Laterality Date  . Cholecystectomy, laparoscopic    . Breast biopsy      x 5  . Shoulder arthroscopy w/ rotator cuff repair Right    family history includes Cancer in an other family member; Diabetes in her son; Heart disease in her father and mother; Stroke in her daughter. Current Outpatient Prescriptions  Medication Sig Dispense Refill  . Acetaminophen 500 MG coapsule Take by mouth.    Marland Kitchen ambrisentan (LETAIRIS) 10 MG tablet Take 10 mg by mouth daily.     Marland Kitchen aspirin EC 325 MG tablet Take 325 mg by mouth daily.    . cholecalciferol (VITAMIN D) 1000 UNITS tablet Take 1,000 Units by mouth daily.     . folic acid-pyridoxine-cyancobalamin (FOLTX) 2.5-25-2 MG TABS Take 1 tablet by mouth daily.    Marland Kitchen levothyroxine (SYNTHROID, LEVOTHROID) 50 MCG tablet Take 50 mcg by mouth every morning.    . Multiple Vitamin (MULTIVITAMIN WITH MINERALS) TABS Take 1 tablet by mouth every morning.     . ranitidine (ZANTAC) 150 MG tablet Take 150 mg by mouth 2 (two) times daily.    . simvastatin (ZOCOR) 20 MG tablet Take 20 mg by mouth every evening.    . Tadalafil, PAH, (ADCIRCA) 20 MG TABS Take 40 mg by mouth at bedtime.     . torsemide (DEMADEX) 20 MG tablet Take 10 mg by mouth daily.      No current facility-administered medications for this visit.   Allergies as of 04/01/2014 - Review Complete 04/01/2014  Allergen Reaction Noted  . Ace inhibitors Cough 05/24/2012  . Codeine  Nausea Only 05/24/2012  . Losartan  07/05/2012  . Sulfa antibiotics Hives 05/24/2012    reports that she quit smoking about 26 years ago. Her smoking use included Cigarettes. She has a 90 pack-year smoking history. She has never used smokeless tobacco. She reports that she drinks alcohol. She reports that she does not use illicit drugs.   Review of Systems: She has Raynaud's disease Pertinent positive and negative review of systems were noted in the above HPI section. All other review of systems were otherwise negative.  Vital signs were reviewed in today's medical record Physical Exam: General: Elderly female in no acute distress Skin: anicteric Head: Normocephalic and atraumatic Eyes:  sclerae anicteric, EOMI Ears: Normal auditory acuity Mouth: No deformity or lesions Neck: Supple, no masses or thyromegaly Lungs: Clear throughout to auscultation Heart: Regular rate and rhythm; no murmurs, rubs or bruits Abdomen: Soft, non tender and non distended. No masses, hepatosplenomegaly or hernias noted. Normal Bowel sounds Rectal: Skin tags are present.  There are no rectal masses.  Stool is dark and Hemoccult  positive Musculoskeletal: Symmetrical with no gross deformities  Skin: No lesions on visible extremities Pulses:  Normal pulses noted Extremities: No clubbing, cyanosis, edema or deformities noted Neurological: Alert oriented x 4, grossly nonfocal Cervical Nodes:  No significant cervical adenopathy Inguinal Nodes: No significant inguinal adenopathy Psychological:  Alert and cooperative. Normal mood and affect  See Assessment and Plan under Problem List

## 2014-04-01 NOTE — Assessment & Plan Note (Signed)
Anemia is probably multifactorial and may include renal insufficiency, possible chronic GI blood loss.  Recommendations #1 upper endoscopy and colonoscopy.  AVMs will be treated with the [facility]

## 2014-04-01 NOTE — Assessment & Plan Note (Signed)
Limited rectal bleeding may be hemorrhoidal.  No gross abnormalities by a barium enema and sigmoidoscopy.  Bleeding from AVMs is also a consideration.  Recommendations #1 colonoscopy.  If AVMs are seen though be cauterized since patient is anemic. #2 to consider band ligation of internal hemorrhoids pending results of above

## 2014-04-01 NOTE — Assessment & Plan Note (Signed)
Patient is symptomatically despite ranitidine.  Plan to add Carafate slurry.  I will speak with the patient's nephrologist to see whether we can try a PPI.

## 2014-04-02 ENCOUNTER — Telehealth (HOSPITAL_COMMUNITY): Payer: Self-pay | Admitting: *Deleted

## 2014-04-02 ENCOUNTER — Encounter (HOSPITAL_COMMUNITY): Admission: RE | Admit: 2014-04-02 | Payer: Medicare Other | Source: Ambulatory Visit

## 2014-04-07 ENCOUNTER — Encounter (HOSPITAL_COMMUNITY)
Admission: RE | Admit: 2014-04-07 | Discharge: 2014-04-07 | Disposition: A | Discharge status: Home / Self Care | Payer: Self-pay | Source: Ambulatory Visit | Attending: Internal Medicine | Admitting: Internal Medicine

## 2014-04-07 NOTE — Progress Notes (Signed)
Meghan Wise completed a Six-Minute Walk Test on 04/07/14 . Meghan Wise walked 942 feet with 0 breaks.  The patient's lowest oxygen saturation was 90% , highest heart rate was 105 , and highest blood pressure was 180/80. The patient was on 2 liters of oxygen with a nasal cannula. Meghan Wise stated that nothing hindered their walk test.

## 2014-04-09 ENCOUNTER — Encounter (HOSPITAL_COMMUNITY)
Admission: RE | Admit: 2014-04-09 | Discharge: 2014-04-09 | Disposition: A | Discharge status: Home / Self Care | Payer: Self-pay | Source: Ambulatory Visit | Attending: Internal Medicine | Admitting: Internal Medicine

## 2014-04-14 ENCOUNTER — Encounter (HOSPITAL_COMMUNITY)
Admission: RE | Admit: 2014-04-14 | Discharge: 2014-04-14 | Disposition: A | Discharge status: Home / Self Care | Payer: Self-pay | Source: Ambulatory Visit | Attending: Internal Medicine | Admitting: Internal Medicine

## 2014-04-16 ENCOUNTER — Encounter (HOSPITAL_COMMUNITY): Payer: Medicare Other

## 2014-04-20 ENCOUNTER — Telehealth: Payer: Self-pay | Admitting: Gastroenterology

## 2014-04-20 NOTE — Telephone Encounter (Signed)
Patient asking about what the procedures can and cannot do. Questions invited and answered.

## 2014-04-21 ENCOUNTER — Encounter (HOSPITAL_COMMUNITY): Payer: Medicare Other

## 2014-04-23 ENCOUNTER — Encounter (HOSPITAL_COMMUNITY): Payer: Self-pay | Admitting: *Deleted

## 2014-04-23 ENCOUNTER — Encounter (HOSPITAL_COMMUNITY)
Admission: RE | Admit: 2014-04-23 | Discharge: 2014-04-23 | Disposition: A | Discharge status: Home / Self Care | Payer: Self-pay | Source: Ambulatory Visit | Attending: Internal Medicine | Admitting: Internal Medicine

## 2014-04-23 NOTE — Progress Notes (Signed)
04-23-14  1510 Dr. Landry Dyke, anesthesiologist given pt review- advised pt to continue Pulmonary hypertension meds as usual ("Adcirca"-at bedtime and "Letairis" AM of) procedure.

## 2014-04-28 ENCOUNTER — Encounter (HOSPITAL_COMMUNITY): Payer: Medicare Other

## 2014-04-28 DIAGNOSIS — I10 Essential (primary) hypertension: Secondary | ICD-10-CM | POA: Insufficient documentation

## 2014-04-28 DIAGNOSIS — Z5189 Encounter for other specified aftercare: Secondary | ICD-10-CM | POA: Insufficient documentation

## 2014-04-28 DIAGNOSIS — I5033 Acute on chronic diastolic (congestive) heart failure: Secondary | ICD-10-CM | POA: Insufficient documentation

## 2014-04-30 ENCOUNTER — Encounter (HOSPITAL_COMMUNITY)
Admission: RE | Admit: 2014-04-30 | Discharge: 2014-04-30 | Disposition: A | Discharge status: Home / Self Care | Payer: Medicare Other | Source: Ambulatory Visit | Attending: Internal Medicine | Admitting: Internal Medicine

## 2014-05-05 ENCOUNTER — Telehealth: Payer: Self-pay | Admitting: Gastroenterology

## 2014-05-05 ENCOUNTER — Encounter (HOSPITAL_COMMUNITY): Payer: Medicare Other

## 2014-05-05 NOTE — Telephone Encounter (Signed)
Spoke with pre-surgical nurse. She says she will call and explain how the anesthesia dept.functions for procedure. She also states anesthesia will likely not call her today. Home number given to nurse.

## 2014-05-05 NOTE — Telephone Encounter (Signed)
Left a message for pre-surgery to call me. I spoke with the patient advising her I will contact the hospital about these concerns.

## 2014-05-05 NOTE — Progress Notes (Signed)
Spoke with patient by phone by phone, she saw dr Leavy Cella [facility] on 05-05-2014 and dr Jone Baseman said he wants the anesthelogist in the room for the procedure and not a crna, checked care everywhere 05-05-14 ov note dr Jone Baseman is not in care erverywhere yet, patient will bring her copy of dr Jone Baseman office visit note from 05-05-14 on 05-07-14

## 2014-05-07 ENCOUNTER — Ambulatory Visit (HOSPITAL_COMMUNITY)
Admission: RE | Admit: 2014-05-07 | Discharge: 2014-05-07 | Disposition: A | Discharge status: Home / Self Care | Payer: Medicare Other | Source: Ambulatory Visit | Attending: Gastroenterology | Admitting: Gastroenterology

## 2014-05-07 ENCOUNTER — Encounter (HOSPITAL_COMMUNITY): Payer: Self-pay | Admitting: Gastroenterology

## 2014-05-07 ENCOUNTER — Ambulatory Visit (HOSPITAL_COMMUNITY): Payer: Medicare Other | Admitting: Anesthesiology

## 2014-05-07 ENCOUNTER — Encounter (HOSPITAL_COMMUNITY)
Admission: RE | Disposition: A | Discharge status: Home / Self Care | Payer: Medicare Other | Source: Ambulatory Visit | Attending: Gastroenterology

## 2014-05-07 ENCOUNTER — Encounter (HOSPITAL_COMMUNITY): Payer: Medicare Other

## 2014-05-07 DIAGNOSIS — Z87891 Personal history of nicotine dependence: Secondary | ICD-10-CM | POA: Insufficient documentation

## 2014-05-07 DIAGNOSIS — Z79899 Other long term (current) drug therapy: Secondary | ICD-10-CM | POA: Insufficient documentation

## 2014-05-07 DIAGNOSIS — K5730 Diverticulosis of large intestine without perforation or abscess without bleeding: Secondary | ICD-10-CM

## 2014-05-07 DIAGNOSIS — Z885 Allergy status to narcotic agent status: Secondary | ICD-10-CM | POA: Insufficient documentation

## 2014-05-07 DIAGNOSIS — K5790 Diverticulosis of intestine, part unspecified, without perforation or abscess without bleeding: Secondary | ICD-10-CM | POA: Diagnosis not present

## 2014-05-07 DIAGNOSIS — R1310 Dysphagia, unspecified: Secondary | ICD-10-CM

## 2014-05-07 DIAGNOSIS — I10 Essential (primary) hypertension: Secondary | ICD-10-CM | POA: Diagnosis not present

## 2014-05-07 DIAGNOSIS — Z882 Allergy status to sulfonamides status: Secondary | ICD-10-CM | POA: Diagnosis not present

## 2014-05-07 DIAGNOSIS — K210 Gastro-esophageal reflux disease with esophagitis: Secondary | ICD-10-CM | POA: Insufficient documentation

## 2014-05-07 DIAGNOSIS — Z888 Allergy status to other drugs, medicaments and biological substances status: Secondary | ICD-10-CM | POA: Diagnosis not present

## 2014-05-07 DIAGNOSIS — E039 Hypothyroidism, unspecified: Secondary | ICD-10-CM | POA: Diagnosis not present

## 2014-05-07 DIAGNOSIS — E785 Hyperlipidemia, unspecified: Secondary | ICD-10-CM | POA: Insufficient documentation

## 2014-05-07 DIAGNOSIS — Q2733 Arteriovenous malformation of digestive system vessel: Secondary | ICD-10-CM | POA: Diagnosis not present

## 2014-05-07 DIAGNOSIS — K921 Melena: Secondary | ICD-10-CM

## 2014-05-07 DIAGNOSIS — M810 Age-related osteoporosis without current pathological fracture: Secondary | ICD-10-CM | POA: Insufficient documentation

## 2014-05-07 DIAGNOSIS — M1990 Unspecified osteoarthritis, unspecified site: Secondary | ICD-10-CM | POA: Insufficient documentation

## 2014-05-07 DIAGNOSIS — K643 Fourth degree hemorrhoids: Secondary | ICD-10-CM | POA: Insufficient documentation

## 2014-05-07 DIAGNOSIS — I7300 Raynaud's syndrome without gangrene: Secondary | ICD-10-CM | POA: Insufficient documentation

## 2014-05-07 DIAGNOSIS — K31819 Angiodysplasia of stomach and duodenum without bleeding: Secondary | ICD-10-CM

## 2014-05-07 DIAGNOSIS — N289 Disorder of kidney and ureter, unspecified: Secondary | ICD-10-CM | POA: Diagnosis not present

## 2014-05-07 DIAGNOSIS — Z7982 Long term (current) use of aspirin: Secondary | ICD-10-CM | POA: Insufficient documentation

## 2014-05-07 DIAGNOSIS — M349 Systemic sclerosis, unspecified: Secondary | ICD-10-CM | POA: Insufficient documentation

## 2014-05-07 DIAGNOSIS — D120 Benign neoplasm of cecum: Secondary | ICD-10-CM | POA: Diagnosis not present

## 2014-05-07 HISTORY — DX: Reserved for inherently not codable concepts without codable children: IMO0001

## 2014-05-07 HISTORY — PX: COLONOSCOPY WITH PROPOFOL: SHX5780

## 2014-05-07 HISTORY — PX: ESOPHAGOGASTRODUODENOSCOPY (EGD) WITH PROPOFOL: SHX5813

## 2014-05-07 HISTORY — PX: HOT HEMOSTASIS: SHX5433

## 2014-05-07 LAB — COMPREHENSIVE METABOLIC PANEL
ALK PHOS: 83 U/L (ref 39–117)
ALT: 12 U/L (ref 0–35)
ANION GAP: 14 (ref 5–15)
AST: 21 U/L (ref 0–37)
Albumin: 3.9 g/dL (ref 3.5–5.2)
BILIRUBIN TOTAL: 0.5 mg/dL (ref 0.3–1.2)
BUN: 34 mg/dL — ABNORMAL HIGH (ref 6–23)
CALCIUM: 9.7 mg/dL (ref 8.4–10.5)
CO2: 20 mmol/L (ref 19–32)
Chloride: 113 mEq/L — ABNORMAL HIGH (ref 96–112)
Creatinine, Ser: 1.74 mg/dL — ABNORMAL HIGH (ref 0.50–1.10)
GFR calc Af Amer: 32 mL/min — ABNORMAL LOW (ref >90)
GFR, EST NON AFRICAN AMERICAN: 27 mL/min — AB (ref >90)
Glucose, Bld: 94 mg/dL (ref 70–99)
Potassium: 3.6 mmol/L (ref 3.5–5.1)
Sodium: 147 mmol/L — ABNORMAL HIGH (ref 135–145)
TOTAL PROTEIN: 7.7 g/dL (ref 6.0–8.3)

## 2014-05-07 LAB — CBC
HCT: 27.8 % — ABNORMAL LOW (ref 36.0–46.0)
Hemoglobin: 8.9 g/dL — ABNORMAL LOW (ref 12.0–15.0)
MCH: 25.6 pg — ABNORMAL LOW (ref 26.0–34.0)
MCHC: 32 g/dL (ref 30.0–36.0)
MCV: 80.1 fL (ref 78.0–100.0)
PLATELETS: 434 10*3/uL — AB (ref 150–400)
RBC: 3.47 MIL/uL — AB (ref 3.87–5.11)
RDW: 15 % (ref 11.5–15.5)
WBC: 7.1 10*3/uL (ref 4.0–10.5)

## 2014-05-07 SURGERY — COLONOSCOPY WITH PROPOFOL
Anesthesia: General | Site: Esophagus

## 2014-05-07 MED ORDER — LIDOCAINE HCL (CARDIAC) 20 MG/ML IV SOLN
INTRAVENOUS | Status: DC | PRN
  Administered 2014-05-07: 50 mg via INTRAVENOUS

## 2014-05-07 MED ORDER — PROPOFOL 10 MG/ML IV BOLUS
INTRAVENOUS | Status: AC
  Filled 2014-05-07: qty 20

## 2014-05-07 MED ORDER — MEPERIDINE HCL 100 MG/ML IJ SOLN: 6.2500 mg | INTRAMUSCULAR | Status: DC | PRN

## 2014-05-07 MED ORDER — EPHEDRINE SULFATE 50 MG/ML IJ SOLN
INTRAMUSCULAR | Status: DC | PRN
  Administered 2014-05-07: 5 mg via INTRAVENOUS
  Administered 2014-05-07 (×2): 2.5 mg via INTRAVENOUS

## 2014-05-07 MED ORDER — DEXAMETHASONE SODIUM PHOSPHATE 10 MG/ML IJ SOLN
INTRAMUSCULAR | Status: DC | PRN
  Administered 2014-05-07: 10 mg via INTRAVENOUS

## 2014-05-07 MED ORDER — SUCCINYLCHOLINE CHLORIDE 20 MG/ML IJ SOLN
INTRAMUSCULAR | Status: DC | PRN
  Administered 2014-05-07: 90 mg via INTRAVENOUS

## 2014-05-07 MED ORDER — FENTANYL CITRATE 0.05 MG/ML IJ SOLN
INTRAMUSCULAR | Status: AC
  Filled 2014-05-07: qty 2

## 2014-05-07 MED ORDER — FENTANYL CITRATE 0.05 MG/ML IJ SOLN: 25.0000 ug | INTRAMUSCULAR | Status: DC | PRN

## 2014-05-07 MED ORDER — PROPOFOL 10 MG/ML IV BOLUS
INTRAVENOUS | Status: DC | PRN
  Administered 2014-05-07: 90 mg via INTRAVENOUS
  Administered 2014-05-07: 20 mg via INTRAVENOUS

## 2014-05-07 MED ORDER — SODIUM CHLORIDE 0.9 % IV SOLN
INTRAVENOUS | Status: DC
  Administered 2014-05-07: 1000 mL via INTRAVENOUS

## 2014-05-07 MED ORDER — LACTATED RINGERS IV SOLN
INTRAVENOUS | Status: DC | PRN
  Administered 2014-05-07 (×2): via INTRAVENOUS

## 2014-05-07 MED ORDER — PROMETHAZINE HCL 25 MG/ML IJ SOLN
6.2500 mg | INTRAMUSCULAR | Status: DC | PRN
Start: 2014-05-07 — End: 2014-05-09

## 2014-05-07 SURGICAL SUPPLY — 25 items

## 2014-05-07 NOTE — Anesthesia Procedure Notes (Signed)
Procedure Name: Intubation Date/Time: 05/07/2014 12:36 PM Performed by: Ofilia Neas Pre-anesthesia Checklist: Patient identified, Timeout performed, Emergency Drugs available, Suction available and Patient being monitored Patient Re-evaluated:Patient Re-evaluated prior to inductionOxygen Delivery Method: Circle system utilized Preoxygenation: Pre-oxygenation with 100% oxygen Intubation Type: IV induction and Cricoid Pressure applied Ventilation: Mask ventilation without difficulty Tube size: 7.5 mm Number of attempts: 2 Airway Equipment and Method: Video-laryngoscopy Placement Confirmation: ETT inserted through vocal cords under direct vision,  positive ETCO2 and breath sounds checked- equal and bilateral Secured at: 21 cm Tube secured with: Tape Dental Injury: Injury to tongue  Difficulty Due To: Difficulty was anticipated, Difficult Airway- due to dentition, Difficult Airway- due to limited oral opening and Difficult Airway- due to anterior larynx Future Recommendations: Recommend- induction with short-acting agent, and alternative techniques readily available Comments: One DL Mac 4 unable to expose cords.  Second DL glide no. 4. Cords visualized, small nick to tongue ? Catch on lower teeth, teeth sharp.

## 2014-05-07 NOTE — Discharge Instructions (Signed)
Colonoscopy, Care After These instructions give you information on caring for yourself after your procedure. Your doctor may also give you more specific instructions. Call your doctor if you have any problems or questions after your procedure. HOME CARE  Do not drive for 24 hours.  Do not sign important papers or use machinery for 24 hours.  You may shower.  You may go back to your usual activities, but go slower for the first 24 hours.  Take rest breaks often during the first 24 hours.  Walk around or use warm packs on your belly (abdomen) if you have belly cramping or gas.  Drink enough fluids to keep your pee (urine) clear or pale yellow.  Resume your normal diet. Avoid heavy or fried foods.  Avoid drinking alcohol for 24 hours or as told by your doctor.  Only take medicines as told by your doctor. If a tissue sample (biopsy) was taken during the procedure:   Do not take aspirin or blood thinners for 7 days, or as told by your doctor.  Do not drink alcohol for 7 days, or as told by your doctor.  Eat soft foods for the first 24 hours. GET HELP IF: You still have a small amount of blood in your poop (stool) 2-3 days after the procedure. GET HELP RIGHT AWAY IF:  You have more than a small amount of blood in your poop.  You see clumps of tissue (blood clots) in your poop.  Your belly is puffy (swollen).  You feel sick to your stomach (nauseous) or throw up (vomit).  You have a fever.  You have belly pain that gets worse and medicine does not help. MAKE SURE YOU:  Understand these instructions.  Will watch your condition.  Will get help right away if you are not doing well or get worse. Document Released: 05/13/2010 Document Revised: 04/15/2013 Document Reviewed: 12/16/2012 [facility] Patient Information 2015 [place], [place]. This information is not intended to replace advice given to you by your health care provider. Make sure you discuss any questions you have with  your health care provider.   Esophagogastroduodenoscopy Care After Refer to this sheet in the next few weeks. These instructions provide you with information on caring for yourself after your procedure. Your caregiver may also give you more specific instructions. Your treatment has been planned according to current medical practices, but problems sometimes occur. Call your caregiver if you have any problems or questions after your procedure.  HOME CARE INSTRUCTIONS  Do not eat or drink anything until the numbing medicine (local anesthetic) has worn off and your gag reflex has returned. You will know that the local anesthetic has worn off when you can swallow comfortably.  Do not drive for 12 hours after the procedure or as directed by your caregiver.  Only take medicines as directed by your caregiver. SEEK MEDICAL CARE IF:   You cannot stop coughing.  You are not urinating at all or less than usual. SEEK IMMEDIATE MEDICAL CARE IF:  You have difficulty swallowing.  You cannot eat or drink.  You have worsening throat or chest pain.  You have dizziness, lightheadedness, or you faint.  You have nausea or vomiting.  You have chills.  You have a fever.  You have severe abdominal pain.  You have black, tarry, or bloody stools. Document Released: 03/27/2012 Document Reviewed: 03/27/2012 [facility] Patient Information 2015 [place]. This information is not intended to replace advice given to you by your health care provider. Make  sure you discuss any questions you have with your health care provider.   Conscious Sedation, Adult, Care After Refer to this sheet in the next few weeks. These instructions provide you with information on caring for yourself after your procedure. Your health care provider may also give you more specific instructions. Your treatment has been planned according to current medical practices, but problems sometimes occur. Call your health care provider if  you have any problems or questions after your procedure. WHAT TO EXPECT AFTER THE PROCEDURE  After your procedure:  You may feel sleepy, clumsy, and have poor balance for several hours.  Vomiting may occur if you eat too soon after the procedure. HOME CARE INSTRUCTIONS  Do not participate in any activities where you could become injured for at least 24 hours. Do not:  Drive.  Swim.  Ride a bicycle.  Operate heavy machinery.  Cook.  Use power tools.  Climb ladders.  Work from a high place.  Do not make important decisions or sign legal documents until you are improved.  If you vomit, drink water, juice, or soup when you can drink without vomiting. Make sure you have little or no nausea before eating solid foods.  Only take over-the-counter or prescription medicines for pain, discomfort, or fever as directed by your health care provider.  Make sure you and your family fully understand everything about the medicines given to you, including what side effects may occur.  You should not drink alcohol, take sleeping pills, or take medicines that cause drowsiness for at least 24 hours.  If you smoke, do not smoke without supervision.  If you are feeling better, you may resume normal activities 24 hours after you were sedated.  Keep all appointments with your health care provider. SEEK MEDICAL CARE IF:  Your skin is pale or bluish in color.  You continue to feel nauseous or vomit.  Your pain is getting worse and is not helped by medicine.  You have bleeding or swelling.  You are still sleepy or feeling clumsy after 24 hours. SEEK IMMEDIATE MEDICAL CARE IF:  You develop a rash.  You have difficulty breathing.  You develop any type of allergic problem.  You have a fever. MAKE SURE YOU:  Understand these instructions.  Will watch your condition.  Will get help right away if you are not doing well or get worse. Document Released: 01/29/2013 Document Reviewed:  01/29/2013 [facility] Patient Information 2015 [place], [place]. This information is not intended to replace advice given to you by your health care provider. Make sure you discuss any questions you have with your health care provider.

## 2014-05-07 NOTE — Anesthesia Postprocedure Evaluation (Signed)
Anesthesia Post Note  Patient: Meghan Wise  Procedure(s) Performed: Procedure(s) (LRB): COLONOSCOPY WITH PROPOFOL (N/A) ESOPHAGOGASTRODUODENOSCOPY (EGD) WITH PROPOFOL (N/A) HOT HEMOSTASIS (ARGON PLASMA COAGULATION/BICAP) (N/A)  Anesthesia type: GA  Patient location: PACU  Post pain: Pain level controlled  Post assessment: Post-op Vital signs reviewed  Last Vitals:  Filed Vitals:   05/07/14 1350  BP:   Pulse:   Temp:   Resp: 19    Post vital signs: Reviewed  Level of consciousness: sedated  Complications: No apparent anesthesia complications

## 2014-05-07 NOTE — Op Note (Signed)
[facility] [place], [id number]   COLONOSCOPY PROCEDURE REPORT     EXAM DATE: 05/07/2014  PATIENT NAME:      Meghan Wise, Meghan Wise           MR #:      [id number]  BIRTHDATE:       06-13-1935      VISIT #:     [phone]  ATTENDING:     Inda Castle, MD     STATUS:     outpatient REFERRING MD: ASA CLASS:  INDICATIONS:  The patient is a 79 yr old female here for a colonoscopy due to hematochezia. PROCEDURE PERFORMED:     Colonoscopy with snare polypectomy MEDICATIONS:     Monitored anesthesia care ESTIMATED BLOOD LOSS:     None  CONSENT: The patient understands the risks and benefits of the procedure and understands that these risks include, but are not limited to: sedation, allergic reaction, infection, perforation and/or bleeding. Alternative means of evaluation and treatment include, among others: physical exam, x-rays, and/or surgical intervention. The patient elects to proceed with this endoscopic procedure.  DESCRIPTION OF PROCEDURE: During intra-op preparation period all mechanical & medical equipment was checked for proper function. Hand hygiene and appropriate measures for infection prevention was taken. After the risks, benefits and alternatives of the procedure were thoroughly explained, Informed consent was verified, confirmed and timeout was successfully executed by the treatment team. A digital exam revealed hemorrhoids, Grade IV.      The Pentax Ped Colon [id number] endoscope was introduced through the anus and advanced to the cecum, which was identified by both the appendix and ileocecal valve. No adverse events experienced. The prep was excellent using Suprep. The instrument was then slowly withdrawn as the colon was fully examined.   COLON FINDINGS: Two sessile polyps ranging from 3 to 12m in size were found at the cecum.  A polypectomy was performed with a cold snare.  The resection was complete, the polyp tissue was  completely retrieved and sent to histology.   There was severe diverticulosis noted in the sigmoid colon.   There was moderate diverticulosis noted in the descending colon.   Internal Grade IV hemorrhoids were found. Retroflexed views revealed internal hemorrhoids.  The scope was then completely withdrawn from the patient and the procedure terminated. WITHDRAWAL TIME: 8 minutes 0 seconds    ADVERSE EVENTS:      There were no immediate complications.  IMPRESSIONS:     1.  Two sessile polyps ranging from 3 to 460min size were found at the cecum; polypectomy was performed with a cold snare 2.  Severe diverticulosis was noted in the sigmoid colon 3.  Moderate diverticulosis was noted in the descending colon 4.  Internal Grade IV hemorrhoids  Hemorrhoids are the source for rectal bleeding and incontinence  RECOMMENDATIONS:     Given your age, you will not need another colonoscopy for colon cancer screening or polyp surveillance. These types of tests usually stop around the age 6692refer to colorectal surgery?"Dr. ElMarijo SanesECALL:  RoInda CastleMD eSigned:  RoInda CastleMD 05/07/2014 1:21 PM   cc:  ElMarijo SanesMD  CPT CODES: ICD CODES:  The ICD and CPT codes recommended by this software are interpretations from the data that the clinical staff has captured with the software.  The verification of the translation of this report to the ICD and CPT codes and modifiers is the sole responsibility of the  health care institution and practicing physician where this report was generated.  [employer] will not be held responsible for the validity of the ICD and CPT codes included on this report.  AMA assumes no liability for data contained or not contained herein. CPT is a Designer, television/film set of the [employer].   PATIENT NAME:  Meghan Wise, Meghan Wise MR#: [id number]

## 2014-05-07 NOTE — Transfer of Care (Signed)
Immediate Anesthesia Transfer of Care Note  Patient: Meghan Wise  Procedure(s) Performed: Procedure(s) with comments: COLONOSCOPY WITH PROPOFOL (N/A) ESOPHAGOGASTRODUODENOSCOPY (EGD) WITH PROPOFOL (N/A) HOT HEMOSTASIS (ARGON PLASMA COAGULATION/BICAP) (N/A) - deuodenal bulb  Patient Location: PACU and Endoscopy Unit  Anesthesia Type:General  Level of Consciousness: awake, alert , oriented, patient cooperative and responds to stimulation  Airway & Oxygen Therapy: Patient Spontanous Breathing and Patient connected to face mask oxygen  Post-op Assessment: Report given to PACU RN, Post -op Vital signs reviewed and stable and Patient moving all extremities  Post vital signs: Reviewed and stable  Complications: No apparent anesthesia complications

## 2014-05-07 NOTE — H&P (Signed)
  The patient stated that she was told by her cardiologist at [facility] that she would require the constant presence of an anesthesiologist during her procedure.  In her clinic note from January 11 Dr.Rajagopal stated  "stressed to her the importance of having an anesthesiologist for the procedure."  He is out of town.  I spoke with his associate, Dr.Fortin , who stated that it was perfectly adequate for the patient to  undergo endoscopic procedures under general anesthesia provided that there is an anesthesiologist nearby but not necessarily in the room.  She also stated that it was acceptable for the patient to restart her pulmonary hypertension medications when returning home today (patient did not take her morning medications).  Dr. Gilles Chiquito spoke directly with the patient and conveyed these recommendations to her.  Case was thoroughly discussed with Dr. Rudean Curt, from anesthesia, who is in agreement with the plan.

## 2014-05-07 NOTE — H&P (Signed)
History of Present Illness: Meghan Wise is a 79 year old white female with history of scleroderma, renal insufficiency, referred for evaluation of dysphagia, rectal bleeding and stool incontinence. She has frequent pyrosis which is only partially controlled with ranitidine. She was told that she cannot take a PPI because of her renal disease. She has dysphagia to solids and liquids. She normally moves her bowels 4-5 times a day. She claims stools are solid but she is unaware of passing small amounts of stool, at times. She's planes of encopresis and is unaware of passing stool. In 2013 she underwent an incomplete colonoscopy. A very redundant colon was noted. This was followed by an air-contrast barium enema which demonstrated left-sided diverticula. Colonic redundancy was also seen. She's had multiple episodes of imminent hematochezia acids of black stools. She denies rectal discomfort.   Past Medical History  Diagnosis Date  . Osteoporosis   . HTN (hypertension)   . Osteoarthritis   . Raynaud disease   . Hypothyroidism   . Heart failure   . Kidney disease   . Hyperlipidemia   . Pulmonary hypertension   . Scleroderma    Past Surgical History  Procedure Laterality Date  . Cholecystectomy, laparoscopic    . Breast biopsy      x 5  . Shoulder arthroscopy w/ rotator cuff repair Right    family history includes Cancer in an other family member; Diabetes in her son; Heart disease in her father and mother; Stroke in her daughter. Current Outpatient Prescriptions  Medication Sig Dispense Refill  . Acetaminophen 500 MG coapsule Take by mouth.    Marland Kitchen ambrisentan (LETAIRIS) 10 MG tablet Take 10 mg by mouth daily.     Marland Kitchen aspirin EC 325 MG tablet Take 325 mg by mouth daily.    . cholecalciferol (VITAMIN D) 1000 UNITS tablet Take 1,000 Units by mouth daily.    . folic acid-pyridoxine-cyancobalamin  (FOLTX) 2.5-25-2 MG TABS Take 1 tablet by mouth daily.    Marland Kitchen levothyroxine (SYNTHROID, LEVOTHROID) 50 MCG tablet Take 50 mcg by mouth every morning.    . Multiple Vitamin (MULTIVITAMIN WITH MINERALS) TABS Take 1 tablet by mouth every morning.     . ranitidine (ZANTAC) 150 MG tablet Take 150 mg by mouth 2 (two) times daily.    . simvastatin (ZOCOR) 20 MG tablet Take 20 mg by mouth every evening.    . Tadalafil, PAH, (ADCIRCA) 20 MG TABS Take 40 mg by mouth at bedtime.     . torsemide (DEMADEX) 20 MG tablet Take 10 mg by mouth daily.      No current facility-administered medications for this visit.   Allergies as of 04/01/2014 - Review Complete 04/01/2014  Allergen Reaction Noted  . Ace inhibitors Cough 05/24/2012  . Codeine Nausea Only 05/24/2012  . Losartan  07/05/2012  . Sulfa antibiotics Hives 05/24/2012    reports that she quit smoking about 26 years ago. Her smoking use included Cigarettes. She has a 90 pack-year smoking history. She has never used smokeless tobacco. She reports that she drinks alcohol. She reports that she does not use illicit drugs.   Review of Systems: She has Raynaud's disease Pertinent positive and negative review of systems were noted in the above HPI section. All other review of systems were otherwise negative.  Vital signs were reviewed in today's medical record Physical Exam: General: Elderly female in no acute distress Skin: anicteric Head: Normocephalic and atraumatic Eyes: sclerae anicteric, EOMI Ears: Normal auditory acuity Mouth: No deformity  or lesions Neck: Supple, no masses or thyromegaly Lungs: Clear throughout to auscultation Heart: Regular rate and rhythm; no murmurs, rubs or bruits Abdomen: Soft, non tender and non distended. No masses, hepatosplenomegaly or hernias noted. Normal Bowel sounds Rectal: Skin tags are present. There are no rectal masses. Stool is dark and Hemoccult  positive Musculoskeletal: Symmetrical with no gross deformities  Skin: No lesions on visible extremities Pulses: Normal pulses noted Extremities: No clubbing, cyanosis, edema or deformities noted Neurological: Alert oriented x 4, grossly nonfocal Cervical Nodes: No significant cervical adenopathy Inguinal Nodes: No significant inguinal adenopathy Psychological: Alert and cooperative. Normal mood and affect  See Assessment and Plan under Problem List                  Dysphagia, pharyngoesophageal phase - Inda Castle, MD at 04/01/2014 12:47 PM     Status: Written Related Problem: Dysphagia, pharyngoesophageal phase   Expand All Collapse All   Etiologies include motility disorder and esophageal stricture  Recommendations #1 upper endoscopy with dilation as indicated            Rectal bleeding - Inda Castle, MD at 04/01/2014 12:49 PM     Status: Written Related Problem: Rectal bleeding   Expand All Collapse All   Limited rectal bleeding may be hemorrhoidal. No gross abnormalities by a barium enema and sigmoidoscopy. Bleeding from AVMs is also a consideration.  Recommendations #1 colonoscopy. If AVMs are seen though be cauterized since patient is anemic. #2 to consider band ligation of internal hemorrhoids pending results of above            Fecal incontinence - Inda Castle, MD at 04/01/2014 12:50 PM     Status: Written Related Problem: Fecal incontinence   Expand All Collapse All   She has a normal rectal exam. Fecal incontinence may be related to hemorrhoidal disease.  Recommendations #1 due to severe band ligation of hemorrhoids            Anemia - Inda Castle, MD at 04/01/2014 12:51 PM     Status: Written Related Problem: Anemia   Expand All Collapse All   Anemia is probably multifactorial and may include renal insufficiency, possible chronic GI blood loss.  Recommendations #1 upper endoscopy and  colonoscopy. AVMs will be treated with the APC            CKD (chronic kidney disease) stage 2, GFR 60-89 ml/min - Inda Castle, MD at 04/01/2014 12:52 PM     Status: Written Related Problem: CKD (chronic kidney disease) stage 2, GFR 60-89 ml/min   Expand All Collapse All   No functions is stable            GERD (gastroesophageal reflux disease) - Inda Castle, MD at 04/01/2014 12:52 PM     Status: Written Related Problem: GERD (gastroesophageal reflux disease)   Expand All Collapse All   Patient is symptomatically despite ranitidine. Plan to add Carafate slurry. I will speak with the patient's nephrologist to see whether we can try a PPI.

## 2014-05-07 NOTE — Anesthesia Preprocedure Evaluation (Addendum)
Anesthesia Evaluation  Patient identified by MRN, date of birth, ID band Patient awake    Reviewed: Allergy & Precautions, H&P , Patient's Chart, lab work & pertinent test results, reviewed documented beta blocker date and time   History of Anesthesia Complications Negative for: history of anesthetic complications  Airway Mallampati: II  TM Distance: >3 FB Neck ROM: full    Dental   Pulmonary shortness of breath and with exertion, former smoker,  breath sounds clear to auscultation        Cardiovascular Exercise Tolerance: Good hypertension, + Peripheral Vascular Disease and +CHF Rhythm:regular Rate:Normal     Neuro/Psych negative psych ROS   GI/Hepatic GERD-  ,  Endo/Other  Hypothyroidism   Renal/GU CRFRenal disease     Musculoskeletal  (+) Arthritis -,   Abdominal   Peds  Hematology  (+) anemia ,   Anesthesia Other Findings Scleroderma Pulmonary hypertension  Reproductive/Obstetrics                            Anesthesia Physical Anesthesia Plan  ASA: III  Anesthesia Plan: General ETT   Post-op Pain Management:    Induction:   Airway Management Planned:   Additional Equipment:   Intra-op Plan:   Post-operative Plan:   Informed Consent: I have reviewed the patients History and Physical, chart, labs and discussed the procedure including the risks, benefits and alternatives for the proposed anesthesia with the patient or authorized representative who has indicated his/her understanding and acceptance.   Dental Advisory Given  Plan Discussed with: CRNA, Surgeon and Anesthesiologist  Anesthesia Plan Comments:        Anesthesia Quick Evaluation 100%Fi02.

## 2014-05-07 NOTE — Op Note (Signed)
[place], [address]   ENDOSCOPY PROCEDURE REPORT  PATIENT: Meghan, Wise  MR#: [id number] BIRTHDATE: 24-Jul-1935 , 31  yrs. old GENDER: female ENDOSCOPIST: Inda Castle, MD REFERRED BY: PROCEDURE DATE:  05/07/2014 PROCEDURE: ASA CLASS:     Class III INDICATIONS:  dysphagia and heartburn. MEDICATIONS: Per Anesthesia TOPICAL ANESTHETIC:  DESCRIPTION OF PROCEDURE: After the risks benefits and alternatives of the procedure were thoroughly explained, informed consent was obtained.  The EG-[id number] ([id number]) endoscope was introduced through the mouth and advanced to the second portion of the duodenum , Without limitations.  The instrument was slowly withdrawn as the mucosa was fully examined.    2 AVMs were seen in the duodenal bulb ranging from 1-3 mm.  Each was obliterated and cauterized utilizing the argon plasma coagulator. 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  There was free reflux after contents into the esophagus.  There is diffuse mucosal erythema in the esophagus.   Except for the findings listed the EGD was otherwise normal.  Retroflexed views revealed no abnormalities.     The scope was then withdrawn from the patient and the procedure completed.  COMPLICATIONS: There were no immediate complications.  ENDOSCOPIC IMPRESSION: 1.   digital AVMs?"status post ablation 2.  free esophageal reflux 3.  nonerosive esophagitis 4.  Diffusely enlarged esophagus consistent with esophageal motility disorder  RECOMMENDATIONS: 1.  Continue current meds 2.   office visit 4-5 weeks  REPEAT EXAM:  eSigned:  Inda Castle, MD 05/07/2014 1:27 PM    CC:  PATIENT NAME:  Meghan, Wise MR#: [id number]

## 2014-05-08 ENCOUNTER — Encounter (HOSPITAL_COMMUNITY): Payer: Self-pay | Admitting: Gastroenterology

## 2014-05-11 ENCOUNTER — Telehealth: Payer: Self-pay | Admitting: Gastroenterology

## 2014-05-11 NOTE — Telephone Encounter (Signed)
She wants to know if you found any cause of her bleeding. She had labs and biopsy

## 2014-05-11 NOTE — Telephone Encounter (Signed)
Yes, I believe she is bleeding from internal hemorrhoids.  I have recommended that she see Dr. Jonita Albee from [facility] surgery because she has grade 4 hemorrhoids.  Bleeding and incontinence are probably related to hemorrhoids

## 2014-05-11 NOTE — Telephone Encounter (Signed)
Patient advised. Call to [facility] and referral made.

## 2014-05-12 ENCOUNTER — Encounter (HOSPITAL_COMMUNITY): Payer: Medicare Other

## 2014-05-12 ENCOUNTER — Encounter: Payer: Self-pay | Admitting: Gastroenterology

## 2014-05-12 NOTE — Telephone Encounter (Signed)
Error

## 2014-05-14 ENCOUNTER — Encounter (HOSPITAL_COMMUNITY): Payer: Medicare Other

## 2014-05-19 ENCOUNTER — Encounter (HOSPITAL_COMMUNITY): Admission: RE | Admit: 2014-05-19 | Payer: Medicare Other | Source: Ambulatory Visit

## 2014-05-21 ENCOUNTER — Other Ambulatory Visit (HOSPITAL_COMMUNITY): Payer: Self-pay | Admitting: *Deleted

## 2014-05-21 ENCOUNTER — Encounter (HOSPITAL_COMMUNITY): Payer: Medicare Other

## 2014-05-22 ENCOUNTER — Encounter (HOSPITAL_COMMUNITY)
Admission: RE | Admit: 2014-05-22 | Discharge: 2014-05-22 | Disposition: A | Discharge status: Home / Self Care | Payer: Medicare Other | Source: Ambulatory Visit | Attending: Nephrology | Admitting: Nephrology

## 2014-05-22 DIAGNOSIS — N189 Chronic kidney disease, unspecified: Secondary | ICD-10-CM | POA: Diagnosis not present

## 2014-05-22 DIAGNOSIS — R5383 Other fatigue: Secondary | ICD-10-CM | POA: Diagnosis not present

## 2014-05-22 DIAGNOSIS — D631 Anemia in chronic kidney disease: Secondary | ICD-10-CM | POA: Insufficient documentation

## 2014-05-22 DIAGNOSIS — D509 Iron deficiency anemia, unspecified: Secondary | ICD-10-CM | POA: Diagnosis present

## 2014-05-22 MED ORDER — SODIUM CHLORIDE 0.9 % IV SOLN
510.0000 mg | INTRAVENOUS | Status: DC
  Administered 2014-05-22: 510 mg via INTRAVENOUS
  Filled 2014-05-22: qty 17

## 2014-05-26 ENCOUNTER — Encounter (HOSPITAL_COMMUNITY): Payer: Medicare Other | Attending: Internal Medicine

## 2014-05-26 DIAGNOSIS — I10 Essential (primary) hypertension: Secondary | ICD-10-CM | POA: Insufficient documentation

## 2014-05-26 DIAGNOSIS — I5033 Acute on chronic diastolic (congestive) heart failure: Secondary | ICD-10-CM | POA: Insufficient documentation

## 2014-05-26 DIAGNOSIS — Z5189 Encounter for other specified aftercare: Secondary | ICD-10-CM | POA: Insufficient documentation

## 2014-05-28 ENCOUNTER — Encounter (HOSPITAL_COMMUNITY): Payer: Medicare Other

## 2014-05-28 ENCOUNTER — Other Ambulatory Visit (HOSPITAL_COMMUNITY): Payer: Self-pay | Admitting: *Deleted

## 2014-05-29 ENCOUNTER — Ambulatory Visit (HOSPITAL_COMMUNITY)
Admission: RE | Admit: 2014-05-29 | Discharge: 2014-05-29 | Disposition: A | Discharge status: Home / Self Care | Payer: Medicare Other | Source: Ambulatory Visit | Attending: Nephrology | Admitting: Nephrology

## 2014-05-29 DIAGNOSIS — D509 Iron deficiency anemia, unspecified: Secondary | ICD-10-CM | POA: Diagnosis present

## 2014-05-29 DIAGNOSIS — N189 Chronic kidney disease, unspecified: Secondary | ICD-10-CM | POA: Diagnosis not present

## 2014-05-29 DIAGNOSIS — R5383 Other fatigue: Secondary | ICD-10-CM | POA: Insufficient documentation

## 2014-05-29 DIAGNOSIS — D631 Anemia in chronic kidney disease: Secondary | ICD-10-CM | POA: Insufficient documentation

## 2014-05-29 MED ORDER — SODIUM CHLORIDE 0.9 % IV SOLN
510.0000 mg | INTRAVENOUS | Status: DC
  Filled 2014-05-29: qty 17

## 2014-06-02 ENCOUNTER — Encounter (HOSPITAL_COMMUNITY): Admission: RE | Admit: 2014-06-02 | Payer: Medicare Other | Source: Ambulatory Visit

## 2014-06-04 ENCOUNTER — Encounter (HOSPITAL_COMMUNITY): Admission: RE | Admit: 2014-06-04 | Payer: Medicare Other | Source: Ambulatory Visit

## 2014-06-09 ENCOUNTER — Encounter (HOSPITAL_COMMUNITY): Payer: Medicare Other

## 2014-06-11 ENCOUNTER — Encounter (HOSPITAL_COMMUNITY): Payer: Medicare Other

## 2014-06-16 ENCOUNTER — Encounter (HOSPITAL_COMMUNITY): Admission: RE | Admit: 2014-06-16 | Payer: Medicare Other | Source: Ambulatory Visit

## 2014-06-18 ENCOUNTER — Encounter (HOSPITAL_COMMUNITY): Payer: Medicare Other

## 2014-06-23 ENCOUNTER — Encounter (HOSPITAL_COMMUNITY): Payer: Self-pay

## 2014-06-23 DIAGNOSIS — I10 Essential (primary) hypertension: Secondary | ICD-10-CM | POA: Insufficient documentation

## 2014-06-23 DIAGNOSIS — Z5189 Encounter for other specified aftercare: Secondary | ICD-10-CM | POA: Insufficient documentation

## 2014-06-23 DIAGNOSIS — I5033 Acute on chronic diastolic (congestive) heart failure: Secondary | ICD-10-CM | POA: Insufficient documentation

## 2014-06-25 ENCOUNTER — Encounter (HOSPITAL_COMMUNITY): Payer: Self-pay

## 2014-06-29 ENCOUNTER — Encounter (HOSPITAL_COMMUNITY)
Admission: RE | Admit: 2014-06-29 | Discharge: 2014-06-29 | Disposition: A | Discharge status: Home / Self Care | Payer: Self-pay | Source: Ambulatory Visit | Attending: Internal Medicine | Admitting: Internal Medicine

## 2014-06-30 ENCOUNTER — Encounter (HOSPITAL_COMMUNITY): Payer: Self-pay

## 2014-07-01 ENCOUNTER — Encounter (HOSPITAL_COMMUNITY)
Admission: RE | Admit: 2014-07-01 | Discharge: 2014-07-01 | Disposition: A | Discharge status: Home / Self Care | Payer: Self-pay | Source: Ambulatory Visit | Attending: Internal Medicine | Admitting: Internal Medicine

## 2014-07-02 ENCOUNTER — Encounter (HOSPITAL_COMMUNITY): Payer: Self-pay

## 2014-07-03 ENCOUNTER — Encounter (HOSPITAL_COMMUNITY)
Admission: RE | Admit: 2014-07-03 | Discharge: 2014-07-03 | Disposition: A | Discharge status: Home / Self Care | Payer: Self-pay | Source: Ambulatory Visit | Attending: Internal Medicine | Admitting: Internal Medicine

## 2014-07-06 ENCOUNTER — Encounter (HOSPITAL_COMMUNITY): Payer: Self-pay

## 2014-07-07 ENCOUNTER — Encounter (HOSPITAL_COMMUNITY): Payer: Self-pay

## 2014-07-08 ENCOUNTER — Encounter (HOSPITAL_COMMUNITY): Payer: Self-pay

## 2014-07-09 ENCOUNTER — Encounter (HOSPITAL_COMMUNITY): Payer: Self-pay

## 2014-07-10 ENCOUNTER — Encounter (HOSPITAL_COMMUNITY)
Admission: RE | Admit: 2014-07-10 | Discharge: 2014-07-10 | Disposition: A | Discharge status: Home / Self Care | Payer: Self-pay | Source: Ambulatory Visit | Attending: Internal Medicine | Admitting: Internal Medicine

## 2014-07-13 ENCOUNTER — Encounter (HOSPITAL_COMMUNITY)
Admission: RE | Admit: 2014-07-13 | Discharge: 2014-07-13 | Disposition: A | Discharge status: Home / Self Care | Payer: Self-pay | Source: Ambulatory Visit | Attending: Internal Medicine | Admitting: Internal Medicine

## 2014-07-14 ENCOUNTER — Encounter (HOSPITAL_COMMUNITY): Payer: Self-pay

## 2014-07-15 ENCOUNTER — Encounter (HOSPITAL_COMMUNITY)
Admission: RE | Admit: 2014-07-15 | Discharge: 2014-07-15 | Disposition: A | Discharge status: Home / Self Care | Payer: Self-pay | Source: Ambulatory Visit | Attending: Internal Medicine | Admitting: Internal Medicine

## 2014-07-16 ENCOUNTER — Encounter (HOSPITAL_COMMUNITY): Payer: Self-pay

## 2014-07-17 ENCOUNTER — Encounter (HOSPITAL_COMMUNITY): Payer: Self-pay

## 2014-07-20 ENCOUNTER — Encounter (HOSPITAL_COMMUNITY)
Admission: RE | Admit: 2014-07-20 | Discharge: 2014-07-20 | Disposition: A | Discharge status: Home / Self Care | Payer: Self-pay | Source: Ambulatory Visit | Attending: Internal Medicine | Admitting: Internal Medicine

## 2014-07-21 ENCOUNTER — Encounter (HOSPITAL_COMMUNITY): Payer: Self-pay

## 2014-07-22 ENCOUNTER — Encounter (HOSPITAL_COMMUNITY): Payer: Self-pay

## 2014-07-23 ENCOUNTER — Encounter (HOSPITAL_COMMUNITY): Payer: Self-pay

## 2014-07-24 ENCOUNTER — Encounter (HOSPITAL_COMMUNITY)
Admission: RE | Admit: 2014-07-24 | Discharge: 2014-07-24 | Disposition: A | Discharge status: Home / Self Care | Payer: Self-pay | Source: Ambulatory Visit | Attending: Internal Medicine | Admitting: Internal Medicine

## 2014-07-24 DIAGNOSIS — Z5189 Encounter for other specified aftercare: Secondary | ICD-10-CM | POA: Insufficient documentation

## 2014-07-24 DIAGNOSIS — I5033 Acute on chronic diastolic (congestive) heart failure: Secondary | ICD-10-CM | POA: Insufficient documentation

## 2014-07-24 DIAGNOSIS — I10 Essential (primary) hypertension: Secondary | ICD-10-CM | POA: Insufficient documentation

## 2014-07-27 ENCOUNTER — Encounter (HOSPITAL_COMMUNITY)
Admission: RE | Admit: 2014-07-27 | Discharge: 2014-07-27 | Disposition: A | Discharge status: Home / Self Care | Payer: Self-pay | Source: Ambulatory Visit | Attending: Internal Medicine | Admitting: Internal Medicine

## 2014-07-29 ENCOUNTER — Encounter (HOSPITAL_COMMUNITY): Payer: Self-pay

## 2014-07-31 ENCOUNTER — Encounter (HOSPITAL_COMMUNITY)
Admission: RE | Admit: 2014-07-31 | Discharge: 2014-07-31 | Disposition: A | Discharge status: Home / Self Care | Payer: Self-pay | Source: Ambulatory Visit | Attending: Internal Medicine | Admitting: Internal Medicine

## 2014-08-03 ENCOUNTER — Encounter (HOSPITAL_COMMUNITY)
Admission: RE | Admit: 2014-08-03 | Discharge: 2014-08-03 | Disposition: A | Discharge status: Home / Self Care | Payer: Self-pay | Source: Ambulatory Visit | Attending: Internal Medicine | Admitting: Internal Medicine

## 2014-08-05 ENCOUNTER — Encounter (HOSPITAL_COMMUNITY)
Admission: RE | Admit: 2014-08-05 | Discharge: 2014-08-05 | Disposition: A | Discharge status: Home / Self Care | Payer: Self-pay | Source: Ambulatory Visit | Attending: Internal Medicine | Admitting: Internal Medicine

## 2014-08-07 ENCOUNTER — Encounter (HOSPITAL_COMMUNITY): Payer: Self-pay

## 2014-08-10 ENCOUNTER — Encounter (HOSPITAL_COMMUNITY)
Admission: RE | Admit: 2014-08-10 | Discharge: 2014-08-10 | Disposition: A | Discharge status: Home / Self Care | Payer: Self-pay | Source: Ambulatory Visit | Attending: Internal Medicine | Admitting: Internal Medicine

## 2014-08-12 ENCOUNTER — Encounter (HOSPITAL_COMMUNITY)
Admission: RE | Admit: 2014-08-12 | Discharge: 2014-08-12 | Disposition: A | Discharge status: Home / Self Care | Payer: Self-pay | Source: Ambulatory Visit | Attending: Internal Medicine | Admitting: Internal Medicine

## 2014-08-14 ENCOUNTER — Encounter (HOSPITAL_COMMUNITY)
Admission: RE | Admit: 2014-08-14 | Discharge: 2014-08-14 | Disposition: A | Discharge status: Home / Self Care | Payer: Self-pay | Source: Ambulatory Visit | Attending: Internal Medicine | Admitting: Internal Medicine

## 2014-08-17 ENCOUNTER — Encounter (HOSPITAL_COMMUNITY)
Admission: RE | Admit: 2014-08-17 | Discharge: 2014-08-17 | Disposition: A | Discharge status: Home / Self Care | Payer: Self-pay | Source: Ambulatory Visit | Attending: Internal Medicine | Admitting: Internal Medicine

## 2014-08-19 ENCOUNTER — Encounter (HOSPITAL_COMMUNITY)
Admission: RE | Admit: 2014-08-19 | Discharge: 2014-08-19 | Disposition: A | Discharge status: Home / Self Care | Payer: Self-pay | Source: Ambulatory Visit | Attending: Internal Medicine | Admitting: Internal Medicine

## 2014-08-21 ENCOUNTER — Encounter (HOSPITAL_COMMUNITY): Payer: Self-pay

## 2014-08-24 ENCOUNTER — Encounter (HOSPITAL_COMMUNITY)
Admission: RE | Admit: 2014-08-24 | Discharge: 2014-08-24 | Disposition: A | Discharge status: Home / Self Care | Payer: Self-pay | Source: Ambulatory Visit | Attending: Internal Medicine | Admitting: Internal Medicine

## 2014-08-24 DIAGNOSIS — Z5189 Encounter for other specified aftercare: Secondary | ICD-10-CM | POA: Insufficient documentation

## 2014-08-24 DIAGNOSIS — I5033 Acute on chronic diastolic (congestive) heart failure: Secondary | ICD-10-CM | POA: Insufficient documentation

## 2014-08-24 DIAGNOSIS — I10 Essential (primary) hypertension: Secondary | ICD-10-CM | POA: Insufficient documentation

## 2014-08-26 ENCOUNTER — Encounter (HOSPITAL_COMMUNITY)
Admission: RE | Admit: 2014-08-26 | Discharge: 2014-08-26 | Disposition: A | Discharge status: Home / Self Care | Payer: Self-pay | Source: Ambulatory Visit | Attending: Internal Medicine | Admitting: Internal Medicine

## 2014-08-28 ENCOUNTER — Encounter (HOSPITAL_COMMUNITY)
Admission: RE | Admit: 2014-08-28 | Discharge: 2014-08-28 | Disposition: A | Discharge status: Home / Self Care | Payer: Self-pay | Source: Ambulatory Visit | Attending: Internal Medicine | Admitting: Internal Medicine

## 2014-08-31 ENCOUNTER — Encounter (HOSPITAL_COMMUNITY)
Admission: RE | Admit: 2014-08-31 | Discharge: 2014-08-31 | Disposition: A | Discharge status: Home / Self Care | Payer: Self-pay | Source: Ambulatory Visit | Attending: Internal Medicine | Admitting: Internal Medicine

## 2014-09-02 ENCOUNTER — Encounter (HOSPITAL_COMMUNITY): Payer: Self-pay

## 2014-09-04 ENCOUNTER — Encounter (HOSPITAL_COMMUNITY): Payer: Self-pay

## 2014-09-07 ENCOUNTER — Encounter (HOSPITAL_COMMUNITY)
Admission: RE | Admit: 2014-09-07 | Discharge: 2014-09-07 | Disposition: A | Discharge status: Home / Self Care | Payer: Self-pay | Source: Ambulatory Visit | Attending: Internal Medicine | Admitting: Internal Medicine

## 2014-09-09 ENCOUNTER — Encounter (HOSPITAL_COMMUNITY): Payer: Self-pay

## 2014-09-11 ENCOUNTER — Encounter (HOSPITAL_COMMUNITY)
Admission: RE | Admit: 2014-09-11 | Discharge: 2014-09-11 | Disposition: A | Discharge status: Home / Self Care | Payer: Self-pay | Source: Ambulatory Visit | Attending: Internal Medicine | Admitting: Internal Medicine

## 2014-09-14 ENCOUNTER — Encounter (HOSPITAL_COMMUNITY): Payer: Self-pay

## 2014-09-16 ENCOUNTER — Encounter (HOSPITAL_COMMUNITY)
Admission: RE | Admit: 2014-09-16 | Discharge: 2014-09-16 | Disposition: A | Discharge status: Home / Self Care | Payer: Self-pay | Source: Ambulatory Visit | Attending: Internal Medicine | Admitting: Internal Medicine

## 2014-09-18 ENCOUNTER — Encounter (HOSPITAL_COMMUNITY): Payer: Self-pay

## 2014-09-21 ENCOUNTER — Encounter (HOSPITAL_COMMUNITY): Payer: Self-pay

## 2014-09-23 ENCOUNTER — Encounter (HOSPITAL_COMMUNITY): Payer: Self-pay

## 2014-09-23 DIAGNOSIS — Z5189 Encounter for other specified aftercare: Secondary | ICD-10-CM | POA: Insufficient documentation

## 2014-09-23 DIAGNOSIS — I10 Essential (primary) hypertension: Secondary | ICD-10-CM | POA: Insufficient documentation

## 2014-09-23 DIAGNOSIS — I5033 Acute on chronic diastolic (congestive) heart failure: Secondary | ICD-10-CM | POA: Insufficient documentation

## 2014-09-25 ENCOUNTER — Encounter (HOSPITAL_COMMUNITY): Payer: Medicare Other

## 2014-09-28 ENCOUNTER — Encounter: Payer: Self-pay | Admitting: Physical Therapy

## 2014-09-28 ENCOUNTER — Ambulatory Visit: Payer: Medicare Other | Attending: Oncology | Admitting: Physical Therapy

## 2014-09-28 ENCOUNTER — Encounter (HOSPITAL_COMMUNITY)
Admission: RE | Admit: 2014-09-28 | Discharge: 2014-09-28 | Disposition: A | Discharge status: Home / Self Care | Payer: Self-pay | Source: Ambulatory Visit | Attending: Internal Medicine | Admitting: Internal Medicine

## 2014-09-28 DIAGNOSIS — N8184 Pelvic muscle wasting: Secondary | ICD-10-CM | POA: Diagnosis present

## 2014-09-28 DIAGNOSIS — M6289 Other specified disorders of muscle: Secondary | ICD-10-CM

## 2014-09-28 DIAGNOSIS — R159 Full incontinence of feces: Secondary | ICD-10-CM | POA: Diagnosis not present

## 2014-09-28 NOTE — Therapy (Signed)
[facility] [address], [place], [place], [address] Phone: [phone]   Fax:  [phone]  Physical Therapy Evaluation  Patient Details  Name: Meghan Wise MRN: [id number] Date of Birth: 25-Jan-1936 Referring Provider:  Trixie Dredge, NP  Encounter Date: 09/28/2014      PT End of Session - 09/28/14 1140    Visit Number 1   Number of Visits 10  Medicare   Date for PT Re-Evaluation 11/23/14   PT Start Time 1100   PT Stop Time 1140   PT Time Calculation (min) 40 min   Activity Tolerance Patient tolerated treatment well   Behavior During Therapy [facility] for tasks assessed/performed      Past Medical History  Diagnosis Date  . Osteoporosis   . HTN (hypertension)   . Osteoarthritis   . Hypothyroidism   . Heart failure   . Hyperlipidemia   . Pulmonary hypertension   . Scleroderma     lungs and kidneys  . Raynaud disease     reactive with cold and stress mostly fingers.  . Kidney disease     Stage IV- Dr. Servando Salina -LOV 02-11-14  . Shortness of breath dyspnea     with exertion-in Cardiac rehab program at [facility].    Past Surgical History  Procedure Laterality Date  . Cholecystectomy, laparoscopic    . Breast biopsy      x 5  . Shoulder arthroscopy w/ rotator cuff repair Right   . Cardiac catheterization      2'15 [facility]  . Cholecystectomy    . Colonoscopy with propofol N/A 05/07/2014    Procedure: COLONOSCOPY WITH PROPOFOL;  Surgeon: Inda Castle, MD;  Location: [facility] ENDOSCOPY;  Service: Endoscopy;  Laterality: N/A;  . Esophagogastroduodenoscopy (egd) with propofol N/A 05/07/2014    Procedure: ESOPHAGOGASTRODUODENOSCOPY (EGD) WITH PROPOFOL;  Surgeon: Inda Castle, MD;  Location: [facility] ENDOSCOPY;  Service: Endoscopy;  Laterality: N/A;  . Hot hemostasis N/A 05/07/2014    Procedure: HOT HEMOSTASIS (ARGON PLASMA COAGULATION/BICAP);  Surgeon: Inda Castle, MD;  Location: [facility];  Service: Endoscopy;   Laterality: N/A;  deuodenal bulb    There were no vitals filed for this visit.  Visit Diagnosis:  PFD (pelvic floor dysfunction) - Plan: PT plan of care cert/re-cert      Subjective Assessment - 09/28/14 1101    Subjective Patient reports bowel problem has been going on for 1 year. Patient reports sudden onset. MD said bowel problem may be connected to her Scleroderma. Patient reports bowel leakage.  Patient reports she has a fecal leakage without realizing it. Bowel leakage wirse with exercise.  No substantial bowel movement . Several leakage during day. .   Pertinent History Scleroderma; Osteoporosis   Patient Stated Goals Stop leakage   Currently in Pain? No/denies            [facility] PT Assessment - 09/28/14 0001    Assessment   Medical Diagnosis R15.9 Bowel Incontinence   Onset Date/Surgical Date 04/24/14   Prior Therapy 1 session   Precautions   Precautions --  no joint mobilization due to osteo   Balance Screen   Has the patient fallen in the past 6 months No   Has the patient had a decrease in activity level because of a fear of falling?  No   Is the patient reluctant to leave their home because of a fear of falling?  No   Prior Function   Level of Independence  Independent   Vocation Retired   Leisure 3 times per week PT for pulmonary Hypertension   Observation/Other Assessments   Focus on Therapeutic Outcomes (FOTO)  59% limitation for bowel leakage   AROM   Lumbar Extension decreased by 50%   Strength   Left Hip ABduction 3/5   Palpation   Palpation comment Pelvis in correct alignment                 Pelvic Floor Special Questions - 09/28/14 0001    Prior Pregnancies Yes   Number of Pregnancies 5   Number of Vaginal Deliveries 5   Any difficulty with labor and deliveries Yes   Urinary Leakage No   Fecal incontinence Yes  leaks 3 times per day. worse in AM. no leakage with sleep   Pelvic Floor Internal Exam Patient confirms identification and  approves therapist to assess pelvic floor anal sphinter and muscle tone   Exam Type Rectal   Sensation reduced   Palpation decreased anterior movement of the puborectalis muscle  external spinter with less contraction of internal sphincter   Strength fair squeeze, definite lift   Strength # of reps 4   Strength # of seconds 3  5 quick flick with better puborectalis muscle contraction    Tone low tone                  PT Education - 09/28/14 1139    Education provided Yes   Education Details Pelvic floor contraction; bowel health; eating soluble fiber   Person(s) Educated Patient   Methods Explanation;Demonstration;Handout   Comprehension Verbalized understanding;Returned demonstration          PT Short Term Goals - 09/28/14 1141    PT SHORT TERM GOAL #1   Title understand bowel health and what fiber to eat to bulk up her bowels   Time 4   Period Weeks   Status New   PT SHORT TERM GOAL #2   Title independent with initial HEP   Time 4   Period Weeks   Status New   PT SHORT TERM GOAL #3   Title hold pelvic floor contraction 10 seconds in sidely at 3/5 strength due to increased endurance   Time 4   Period Weeks   Status New   PT SHORT TERM GOAL #4   Title bowel leakage improved by 20%   Time 4   Period Weeks   Status New           PT Long Term Goals - 09/28/14 1142    PT LONG TERM GOAL #1   Title independent with HEP and understands how to progress herself   Time 8   Period Weeks   Status New   PT LONG TERM GOAL #2   Title pelvic floor strength 4/5 holding 10 seconds with puborectalis muscle able to pull forward   Time 8   Period Weeks   Status New   PT LONG TERM GOAL #3   Title bowel leakage improved >/= 75%   Time 8   Period Weeks   Status New   PT LONG TERM GOAL #4   Title able to get to the bathroom without rushing due to improved pelvic floor strength   Time 8   Period Weeks   Status New               Plan - 09/28/14 1144     Clinical Impression Statement Patient is a 79 year old female  with fecal incontinence.  Patient will leak feces 3 times per day in the begining of the day and change her light day pad each time.  Patient has to quickly go to the bathroom quickly when she has the urge. Patient pelvic floor strength is 3/5 with puborectalis muscle having difficulty with pulling forward.  Patient has decreased tone of the external sphinter  compared to the internal sphinter.  Patient FOTO score is 59% limitation.  Patient would benefit from pelvic floor strengthening and ways to cope with the urge to have a bowel movement.    Pt will benefit from skilled therapeutic intervention in order to improve on the following deficits Decreased strength;Decreased activity tolerance;Decreased endurance   Rehab Potential Good   PT Frequency 1x / week   PT Duration 8 weeks   PT Treatment/Interventions Biofeedback;Therapeutic activities;Therapeutic exercise;Neuromuscular re-education;Manual techniques;Patient/family education   PT Next Visit Plan pelvic floor strengthening; lower abdominal strengthening   PT Home Exercise Plan pelvic floor strengthening   Recommended Other Services None   Consulted and Agree with Plan of Care Patient          G-Codes - Oct 24, 2014 1053    Functional Assessment Tool Used FOTO score is 59% limitaiton for bowel leakage   Functional Limitation Other PT primary   Other PT Primary Current Status (J1552) At least 40 percent but less than 60 percent impaired, limited or restricted   Other PT Primary Goal Status (C8022) At least 20 percent but less than 40 percent impaired, limited or restricted       Problem List Patient Active Problem List   Diagnosis Date Noted  . Benign neoplasm of colon 05/07/2014  . Diverticulosis of colon without hemorrhage 05/07/2014  . Internal bleeding hemorrhoids 05/07/2014  . AVM (arteriovenous malformation) of duodenum, acquired 05/07/2014  . Fecal incontinence  04/01/2014  . Dysphagia, pharyngoesophageal phase 04/01/2014  . Rectal bleeding 04/01/2014  . GERD (gastroesophageal reflux disease) 04/01/2014  . Anemia 04/01/2014  . Pulmonary nodules 08/26/2012  . Cough 08/12/2012  . Dyspnea on exertion 07/08/2012  . Acute on chronic diastolic CHF (congestive heart failure), NYHA class 4 05/28/2012  . Hypervitaminosis D 05/28/2012  . Hypokalemia 05/27/2012  . Hypomagnesemia 05/27/2012  . Mediastinal lymphadenopathy 05/25/2012  . Constipation 05/25/2012  . Dehydration 05/24/2012  . ARF (acute renal failure) 05/24/2012  . Hypercalcemia 05/24/2012  . CKD (chronic kidney disease) stage 2, GFR 60-89 ml/min 05/24/2012  . Osteoporosis   . HTN (hypertension)   . Osteoarthritis   . Raynaud disease   . Hypothyroidism     Connery Shiffler,PT 2014/10/24, 2:05 PM  [facility] [address], [place], [place], [address] Phone: [phone]   Fax:  [phone]

## 2014-09-28 NOTE — Patient Instructions (Signed)
Introduction to Fiber Fiber Overview Dietary fiber is the part of plants that can't be digested. There are 2 kinds of dietary fiber: insoluble and soluble.  Insoluble fiber adds bulk to keep foods moving through the digestive system. Soluble fiber holds water, which softens the stool for easy bowel movements. Fiber is an important part of your diet, even though it passes through your body undigested and has no nutritional value. A high fiber diet can:  . promote regular bowel movements  . treat diverticular disease (inflammation of part of the intestine) and irritable bowel syndrome (abdominal pain, diarrhea, and constipation that come and go) . promote improvement in hemorrhoids, constipation and fecal incontinence  You should have at least 14 grams of fiber for every 1000 calories you eat every day. Read the label on every food package to find out how much fiber a serving of the food will provide. Foods containing more than 20% of the daily value of fiber per serving are considered high in fiber.  Without enough fiber in your diet, you may suffer from:  . constipation  . small, hard, dry bowel movements.   Sources of Fiber Breads, cereals, and pasta made with whole grain flour, and brown rice are high fiber foods. Many breakfast cereals list the bran or fiber content, so it's easy to know which products are high in fiber.  All fruits and vegetables also contain fiber. Dried beans, leafy vegetables, peas, raisins, prunes, apples, and citrus fruits are all especially good sources of fiber. Ask for examples of high-fiber foods (the fiber table and types of fiber handouts) for more resources on fiber.  Additional information on fiber content in foods, is available at www.caloriecounts.com  Types of Fiber  There are two main types of fiber:  insoluble and soluble.  Both of these types can prevent and relieve constipation and diarrhea, although some people find one or the other to be more easily  digested.  This handout details information about both types of fiber.  Insoluble Fiber       Functions of Insoluble Fiber . moves bulk through the intestines  . controls and balances the pH (acidity) in the intestines       Benefits of Insoluble Fiber . promotes regular bowel movement and prevents constipation  . removes fecal waste through colon in less time  . keeps an optimal pH in intestines to prevent microbes from producing cancer substances, therefore preventing colon cancer        Food Sources of Insoluble Fiber . whole-wheat products  . wheat bran "miller's bran" . corn bran  . flax seed or other seeds . vegetables such as green beans, broccoli, cauliflower and potato skins  . fruit skins and root vegetable skins  . popcorn . brown rice  Soluble Fiber       Functions of Soluble Fiber  . holds water in the colon to bulk and soften the stool . prolongs stomach emptying time so that sugar is released and absorbed more slowly        Benefits of Soluble Fiber . lowers total cholesterol and LDL cholesterol (the bad cholesterol) therefore reducing the risk of heart disease  . regulates blood sugar for people with diabetes       Food Sources of Soluble Fiber . oat/oat bran . dried beans and peas  . nuts  . barley  . flax seed or other seeds . fruits such as oranges, pears, peaches, and apples  . vegetables such as carrots  .  psyllium husk  . Prunes  Introduction to Bowel Health Diet and daily habits can help you predict when your bowels will move on a regular basis.  The consistency and quantity of the stool is usually more important than the frequency.  The goal is to have a regular bowel movement that is soft but formed.   Tips on Emptying Regularly . Eat breakfast.  Usually the best time of day for a bowel movement will be a half hour to an hour after eating.  These times are best because the body uses the gastrocolic reflex, a stimulation of bowel motion that  occurs with eating, to help produce a bowel movement.  For some people even a simple hot drink in the morning can help the reflex action begin. . Eat all your meals at a predictable time each day.  The bowel functions best when food is introduced at the same regular intervals. . The amount of food eaten at a given time of day should be about the same size from day to day.  The bowel functions best when food is introduced in similar quantities from day to day. It is fine to have a small breakfast and a large lunch, or vice versa, just be consistent. . Eat two servings of fruit or vegetables and at least one serving of a complex carbohydrates (whole grains such as brown rice, bran, whole wheat bread, or oatmeal) at each meal. . Drink plenty of water-ideally eight glasses a day.  Be sure to increase your water intake if you are increasing fiber into your diet.  Maintain Healthy Habits . Exercise daily.  You may exercise at any time of day, but you may find that bowel function is helped most if the exercise is at a consistent time each day. . Make sure that you are not rushed and have convenient access to a bathroom at your selected time to empty your bowels.  [facility] [address] [place],  [address] Phone # [phone] Fax [phone]

## 2014-09-30 ENCOUNTER — Encounter (HOSPITAL_COMMUNITY)
Admission: RE | Admit: 2014-09-30 | Discharge: 2014-09-30 | Disposition: A | Discharge status: Home / Self Care | Payer: Self-pay | Source: Ambulatory Visit | Attending: Internal Medicine | Admitting: Internal Medicine

## 2014-10-02 ENCOUNTER — Encounter (HOSPITAL_COMMUNITY): Payer: Self-pay

## 2014-10-05 ENCOUNTER — Ambulatory Visit: Payer: Medicare Other | Admitting: Physical Therapy

## 2014-10-05 ENCOUNTER — Encounter (HOSPITAL_COMMUNITY)
Admission: RE | Admit: 2014-10-05 | Discharge: 2014-10-05 | Disposition: A | Discharge status: Home / Self Care | Payer: Self-pay | Source: Ambulatory Visit | Attending: Internal Medicine | Admitting: Internal Medicine

## 2014-10-05 ENCOUNTER — Encounter: Payer: Self-pay | Admitting: Physical Therapy

## 2014-10-05 DIAGNOSIS — N8184 Pelvic muscle wasting: Secondary | ICD-10-CM | POA: Diagnosis not present

## 2014-10-05 DIAGNOSIS — M6289 Other specified disorders of muscle: Secondary | ICD-10-CM

## 2014-10-05 NOTE — Patient Instructions (Addendum)
Relaxation Exercises with the Urge to Void   When you experience an urge to void:  FIRST  Stop and stand very still    Sit down if you can    Don't move    You need to stay very still to maintain control  SECOND Squeeze your pelvic floor muscles 5 times, like a quick flick, to keep from leaking  THIRD Relax  Take a deep breath and then let it out  Try to make the urge go away by using relaxation and visualization techniques  FINALLY When you feel the urge go away somewhat, walk normally to the bathroom.   If the urge gets suddenly stronger on the way, you may stop again and relax to regain control.  Bracing With Knee Fallout (Hook-Lying)   With neutral spine, tighten pelvic floor and abdominals and hold. Drop both  knees out to side while breathing in and bring both knees in as you breathe out. Keep opposite hip still. Repeat _10__ times. Do _1__ times a day.    Copyright  VHI. All rights reserved.  Bracing With Leg March (Hook-Lying)   With neutral spine, tighten pelvic floor and abdominals and hold. Lift one foot _6__ inches and return to floor. Repeat _10__ times. Do __1_ times a day. Then do on the other leg.    Copyright  VHI. All rights reserved.  Quick Contraction: Gravity Resisted (Sitting)   Sitting, quickly squeeze then fully relax pelvic floor. Perform __5_ sets of _1__. Rest for _1__ seconds between sets. Do _3__ times a day.  Copyright  VHI. All rights reserved.  Slow Contraction: Gravity Resisted (Sitting)   Sitting, slowly squeeze pelvic floor for _5__ seconds. Rest for _10__ seconds. Repeat _5__ times. Do __3_ times a day.  Copyright  VHI. All rights reserved.  Strengthening: Hip Abduction (Side-Lying)   Tighten muscles on front of left thigh and pelvic floor, then lift leg _6___ inches from surface, keeping knee locked. Do not let leg come forward.  Repeat _10___ times per set. Do __1__ sets per session. Do __1__ sessions per day. Then do  the other side.  http://orth.exer.us/622   Copyright  VHI. All rights reserved.  [place] [address], [place], [place] [address] Phone # [phone] Fax [phone]

## 2014-10-05 NOTE — Therapy (Signed)
[facility] [address] [place], [place], [address] Phone: [phone]   Fax:  [phone]  Physical Therapy Treatment  Patient Details  Name: Meghan Wise MRN: [id number] Date of Birth: Aug 22, 1935 Referring Provider:  Trixie Dredge, NP  Encounter Date: 10/05/2014      PT End of Session - 10/05/14 1112    Visit Number 2   Number of Visits 10  Medicare   Date for PT Re-Evaluation 11/23/14   PT Start Time 1110   PT Stop Time 1147   PT Time Calculation (min) 37 min   Activity Tolerance Patient tolerated treatment well   Behavior During Therapy [facility] for tasks assessed/performed      Past Medical History  Diagnosis Date  . Osteoporosis   . HTN (hypertension)   . Osteoarthritis   . Hypothyroidism   . Heart failure   . Hyperlipidemia   . Pulmonary hypertension   . Scleroderma     lungs and kidneys  . Raynaud disease     reactive with cold and stress mostly fingers.  . Kidney disease     Stage IV- Dr. Servando Salina -LOV 02-11-14  . Shortness of breath dyspnea     with exertion-in Cardiac rehab program at [facility].    Past Surgical History  Procedure Laterality Date  . Cholecystectomy, laparoscopic    . Breast biopsy      x 5  . Shoulder arthroscopy w/ rotator cuff repair Right   . Cardiac catheterization      2'15 [facility]  . Cholecystectomy    . Colonoscopy with propofol N/A 05/07/2014    Procedure: COLONOSCOPY WITH PROPOFOL;  Surgeon: Inda Castle, MD;  Location: [facility];  Service: Endoscopy;  Laterality: N/A;  . Esophagogastroduodenoscopy (egd) with propofol N/A 05/07/2014    Procedure: ESOPHAGOGASTRODUODENOSCOPY (EGD) WITH PROPOFOL;  Surgeon: Inda Castle, MD;  Location: [facility];  Service: Endoscopy;  Laterality: N/A;  . Hot hemostasis N/A 05/07/2014    Procedure: HOT HEMOSTASIS (ARGON PLASMA COAGULATION/BICAP);  Surgeon: Inda Castle, MD;  Location: [facility];  Service: Endoscopy;   Laterality: N/A;  deuodenal bulb    There were no vitals filed for this visit.  Visit Diagnosis:  PFD (pelvic floor dysfunction)      Subjective Assessment - 10/05/14 1111    Subjective I felt okay after the evaluation. I have been doing the exercises.  I feel an improvement.  I have trouble with relaxing the pelvic floor muscles half way. Patient notices less bowel leakage.  I am more able to feel like I have to go.  I still go many times.    Pertinent History Scleroderma; Osteoporosis   Patient Stated Goals Stop leakage   Currently in Pain? No/denies   Multiple Pain Sites No                         [facility] Adult PT Treatment/Exercise - 10/05/14 0001    Lumbar Exercises: Supine   Ab Set 5 reps;5 seconds                PT Education - 10/05/14 1140    Education provided Yes   Education Details urge to void; pelvic floor contraction in sitting; hip abduction in sidely   Person(s) Educated Patient   Methods Explanation;Demonstration;Tactile cues;Verbal cues;Handout   Comprehension Returned demonstration;Verbalized understanding          PT Short Term Goals - 10/05/14 1113  PT SHORT TERM GOAL #1   Title understand bowel health and what fiber to eat to bulk up her bowels   Time 4   Period Weeks   Status Achieved   PT SHORT TERM GOAL #2   Title independent with initial HEP   Time 4   Period Weeks   Status Achieved   PT SHORT TERM GOAL #3   Title hold pelvic floor contraction 10 seconds in sidely at 3/5 strength due to increased endurance   Time 4   Period Weeks   Status On-going   PT SHORT TERM GOAL #4   Title bowel leakage improved by 20%   Time 4   Period Weeks   Status Achieved  60% better           PT Long Term Goals - 09/28/14 1142    PT LONG TERM GOAL #1   Title independent with HEP and understands how to progress herself   Time 8   Period Weeks   Status New   PT LONG TERM GOAL #2   Title pelvic floor strength 4/5 holding  10 seconds with puborectalis muscle able to pull forward   Time 8   Period Weeks   Status New   PT LONG TERM GOAL #3   Title bowel leakage improved >/= 75%   Time 8   Period Weeks   Status New   PT LONG TERM GOAL #4   Title able to get to the bathroom without rushing due to improved pelvic floor strength   Time 8   Period Weeks   Status New               Plan - 10/05/14 1147    Clinical Impression Statement Patient is a 79 year old femalw with fecal incontience.  Patient reports her fecal leakage has improved by 60% since the intial evaluation.  Patient is independent with her HEP and wears 1-2 pads per day.  Patient has learned on avoideing the urge  and will use it at home .  Patient bowel consistency contiues to stay loose with the change in her fiber.  Patient continues to require physical therapy to  furhter increase pelvic floor strength.     Pt will benefit from skilled therapeutic intervention in order to improve on the following deficits Decreased strength;Decreased activity tolerance;Decreased endurance   Rehab Potential Good   PT Frequency 1x / week   PT Duration 8 weeks   PT Treatment/Interventions Biofeedback;Therapeutic activities;Therapeutic exercise;Neuromuscular re-education;Manual techniques;Patient/family education   PT Next Visit Plan pelvic floor strengthening; lower abdominal strengthening; assess urge to void to see if helping   PT Home Exercise Plan progress as needed   Consulted and Agree with Plan of Care Patient        Problem List Patient Active Problem List   Diagnosis Date Noted  . Benign neoplasm of colon 05/07/2014  . Diverticulosis of colon without hemorrhage 05/07/2014  . Internal bleeding hemorrhoids 05/07/2014  . AVM (arteriovenous malformation) of duodenum, acquired 05/07/2014  . Fecal incontinence 04/01/2014  . Dysphagia, pharyngoesophageal phase 04/01/2014  . Rectal bleeding 04/01/2014  . GERD (gastroesophageal reflux disease)  04/01/2014  . Anemia 04/01/2014  . Pulmonary nodules 08/26/2012  . Cough 08/12/2012  . Dyspnea on exertion 07/08/2012  . Acute on chronic diastolic CHF (congestive heart failure), NYHA class 4 05/28/2012  . Hypervitaminosis D 05/28/2012  . Hypokalemia 05/27/2012  . Hypomagnesemia 05/27/2012  . Mediastinal lymphadenopathy 05/25/2012  . Constipation 05/25/2012  .  Dehydration 05/24/2012  . ARF (acute renal failure) 05/24/2012  . Hypercalcemia 05/24/2012  . CKD (chronic kidney disease) stage 2, GFR 60-89 ml/min 05/24/2012  . Osteoporosis   . HTN (hypertension)   . Osteoarthritis   . Raynaud disease   . Hypothyroidism     Meghan Wise,PT 10/05/2014, 11:52 AM  [facility] [address] [place], [place], [address] Phone: [phone]   Fax:  [phone]

## 2014-10-07 ENCOUNTER — Encounter (HOSPITAL_COMMUNITY)
Admission: RE | Admit: 2014-10-07 | Discharge: 2014-10-07 | Disposition: A | Discharge status: Home / Self Care | Payer: Self-pay | Source: Ambulatory Visit | Attending: Internal Medicine | Admitting: Internal Medicine

## 2014-10-08 ENCOUNTER — Other Ambulatory Visit: Payer: Self-pay

## 2014-10-08 DIAGNOSIS — Z1231 Encounter for screening mammogram for malignant neoplasm of breast: Secondary | ICD-10-CM

## 2014-10-09 ENCOUNTER — Encounter (HOSPITAL_COMMUNITY)
Admission: RE | Admit: 2014-10-09 | Discharge: 2014-10-09 | Disposition: A | Discharge status: Home / Self Care | Payer: Self-pay | Source: Ambulatory Visit | Attending: Internal Medicine | Admitting: Internal Medicine

## 2014-10-12 ENCOUNTER — Encounter (HOSPITAL_COMMUNITY)
Admission: RE | Admit: 2014-10-12 | Discharge: 2014-10-12 | Disposition: A | Discharge status: Home / Self Care | Payer: Self-pay | Source: Ambulatory Visit | Attending: Internal Medicine | Admitting: Internal Medicine

## 2014-10-14 ENCOUNTER — Encounter (HOSPITAL_COMMUNITY)
Admission: RE | Admit: 2014-10-14 | Discharge: 2014-10-14 | Disposition: A | Discharge status: Home / Self Care | Payer: Self-pay | Source: Ambulatory Visit | Attending: Internal Medicine | Admitting: Internal Medicine

## 2014-10-16 ENCOUNTER — Encounter (HOSPITAL_COMMUNITY): Payer: Self-pay

## 2014-10-19 ENCOUNTER — Other Ambulatory Visit: Payer: Self-pay

## 2014-10-19 ENCOUNTER — Ambulatory Visit: Payer: Medicare Other | Admitting: Physical Therapy

## 2014-10-19 ENCOUNTER — Encounter: Payer: Self-pay | Admitting: Physical Therapy

## 2014-10-19 ENCOUNTER — Encounter (HOSPITAL_COMMUNITY)
Admission: RE | Admit: 2014-10-19 | Discharge: 2014-10-19 | Disposition: A | Discharge status: Home / Self Care | Payer: Self-pay | Source: Ambulatory Visit | Attending: Internal Medicine | Admitting: Internal Medicine

## 2014-10-19 DIAGNOSIS — N8184 Pelvic muscle wasting: Secondary | ICD-10-CM | POA: Diagnosis not present

## 2014-10-19 DIAGNOSIS — M6289 Other specified disorders of muscle: Secondary | ICD-10-CM

## 2014-10-19 NOTE — Therapy (Signed)
[facility] [address], [place], [place], [address] Phone: [phone]   Fax:  [phone]  Physical Therapy Treatment  Patient Details  Name: Meghan Wise MRN: [id number] Date of Birth: 07-30-1935 Referring Provider:  Trixie Dredge, NP  Encounter Date: 10/19/2014      PT End of Session - 10/19/14 1129    Visit Number 3   Number of Visits 10  Medicare   Date for PT Re-Evaluation 11/23/14   PT Start Time 1100   PT Stop Time 1140   PT Time Calculation (min) 40 min   Activity Tolerance Patient tolerated treatment well   Behavior During Therapy [facility] for tasks assessed/performed      Past Medical History  Diagnosis Date  . Osteoporosis   . HTN (hypertension)   . Osteoarthritis   . Hypothyroidism   . Heart failure   . Hyperlipidemia   . Pulmonary hypertension   . Scleroderma     lungs and kidneys  . Raynaud disease     reactive with cold and stress mostly fingers.  . Kidney disease     Stage IV- Dr. Servando Salina -LOV 02-11-14  . Shortness of breath dyspnea     with exertion-in Cardiac rehab program at [facility].    Past Surgical History  Procedure Laterality Date  . Cholecystectomy, laparoscopic    . Breast biopsy      x 5  . Shoulder arthroscopy w/ rotator cuff repair Right   . Cardiac catheterization      2'15 [facility]  . Cholecystectomy    . Colonoscopy with propofol N/A 05/07/2014    Procedure: COLONOSCOPY WITH PROPOFOL;  Surgeon: Inda Castle, MD;  [facility]: [facility];  Service: Endoscopy;  Laterality: N/A;  . Esophagogastroduodenoscopy (egd) with propofol N/A 05/07/2014    Procedure: ESOPHAGOGASTRODUODENOSCOPY (EGD) WITH PROPOFOL;  Surgeon: Inda Castle, MD;  Location: [facility];  Service: Endoscopy;  Laterality: N/A;  . Hot hemostasis N/A 05/07/2014    Procedure: HOT HEMOSTASIS (ARGON PLASMA COAGULATION/BICAP);  Surgeon: Inda Castle, MD;  Location: [facility];  Service: Endoscopy;   Laterality: N/A;  deuodenal bulb    There were no vitals filed for this visit.  Visit Diagnosis:  PFD (pelvic floor dysfunction)      Subjective Assessment - 10/19/14 1108    Subjective I have cut my immodium in half.    Pertinent History Scleroderma; Osteoporosis   Patient Stated Goals Stop leakage   Currently in Pain? No/denies                      Pelvic Floor Special Questions - 10/19/14 0001    Pelvic Floor Internal Exam Patient confirms identification and approves therapist to assess pelvic floor anal sphinter and muscle tone   Exam Type Rectal   Strength good squeeze, good lift, able to hold agaisnt strong resistance   Strength # of seconds 5   Tone needs tapping on posterior anal to facilitate muscle for 5 second contraction                   PT Education - 10/19/14 1130    Education provided Yes   Education Details hip abd with resistance, hip IR/ER with resistance, hookly march with yellow band; sitting pelvic floor contraction   Person(s) Educated Patient   Methods Explanation;Demonstration;Tactile cues;Verbal cues;Handout   Comprehension Verbalized understanding;Returned demonstration          PT Short Term Goals -  10/19/14 1118    PT SHORT TERM GOAL #3   Title hold pelvic floor contraction 10 seconds in sidely at 3/5 strength due to increased endurance   Time 4   Period Weeks   Status On-going  4/5 but hold 5 seconds           PT Long Term Goals - 10/19/14 1108    PT LONG TERM GOAL #1   Title independent with HEP and understands how to progress herself   Time 8   Period Weeks   Status New  still learning exercises   PT LONG TERM GOAL #2   Title pelvic floor strength 4/5 holding 10 seconds with puborectalis muscle able to pull forward   Time 8   Period Weeks   Status On-going  4/5 holding 5 seconds   PT LONG TERM GOAL #3   Title bowel leakage improved >/= 75%   Time 8   Period Weeks   Status Achieved  80% better    PT LONG TERM GOAL #4   Title able to get to the bathroom without rushing due to improved pelvic floor strength   Time 8   Period Weeks   Status Achieved               Plan - 10/19/14 1135    Clinical Impression Statement Patient has increased pelvic floor strength to 4/5 holding 5 seconds.  Patient has decreased endurance of pelvic floor contraction especially the posterior aspect.  Patient reports her fecal leakage has improved by 80%.  Patient still feels like she needs to wear a pad due to the 20% leakage.  Patient is able to avoid the urge to have a bowel movement by doing another activity. Patient has met LTG # 3 and 4.    Pt will benefit from skilled therapeutic intervention in order to improve on the following deficits Decreased strength;Decreased activity tolerance;Decreased endurance   Rehab Potential Good   PT Frequency 1x / week   PT Duration 8 weeks   PT Treatment/Interventions Biofeedback;Therapeutic activities;Therapeutic exercise;Neuromuscular re-education;Manual techniques;Patient/family education   PT Next Visit Plan progress pelvic floor contraction   PT Home Exercise Plan progress as needed   Consulted and Agree with Plan of Care Patient        Problem List Patient Active Problem List   Diagnosis Date Noted  . Benign neoplasm of colon 05/07/2014  . Diverticulosis of colon without hemorrhage 05/07/2014  . Internal bleeding hemorrhoids 05/07/2014  . AVM (arteriovenous malformation) of duodenum, acquired 05/07/2014  . Fecal incontinence 04/01/2014  . Dysphagia, pharyngoesophageal phase 04/01/2014  . Rectal bleeding 04/01/2014  . GERD (gastroesophageal reflux disease) 04/01/2014  . Anemia 04/01/2014  . Pulmonary nodules 08/26/2012  . Cough 08/12/2012  . Dyspnea on exertion 07/08/2012  . Acute on chronic diastolic CHF (congestive heart failure), NYHA class 4 05/28/2012  . Hypervitaminosis D 05/28/2012  . Hypokalemia 05/27/2012  . Hypomagnesemia  05/27/2012  . Mediastinal lymphadenopathy 05/25/2012  . Constipation 05/25/2012  . Dehydration 05/24/2012  . ARF (acute renal failure) 05/24/2012  . Hypercalcemia 05/24/2012  . CKD (chronic kidney disease) stage 2, GFR 60-89 ml/min 05/24/2012  . Osteoporosis   . HTN (hypertension)   . Osteoarthritis   . Raynaud disease   . Hypothyroidism     GRAY,CHERYL,PT 10/19/2014, 11:41 AM  [facility] [address] [place], [place], [address] Phone: [phone]   Fax:  [phone]

## 2014-10-19 NOTE — Patient Instructions (Addendum)
Bracing With Knee Fallout (Hook-Lying)  Tie yellow band around knees.  With neutral spine, tighten pelvic floor and abdominals and hold. Alternating legs, drop knee out to side. Keep opposite hip still. Repeat 10___ times. Do 1___ times a day. Hold pelvic floor while bringing knee in and out then rest.   Copyright  VHI. All rights reserved.   Bracing With Leg March (Hook-Lying)  Tie yellow band around knees. Keep knee and hip at 90/90 With neutral spine, tighten pelvic floor and abdominals and hold. Alternating legs, lift foot _6__ inches and return to floor. Repeat _10__ times. Do _1__ times a day. Hold pelvic floor while left leg up and down then rest.   Copyright  VHI. All rights reserved.  Strengthening: Hip Abduction (Side-Lying)  Tie yellow band around knees. Hold pelvic floor contraction while bring leg up and down then rest.  Tighten muscles on front of left thigh, then lift leg _6___ inches from surface, keeping knee locked.  Repeat __10__ times per set. Do __1__ sets per session. Do _1___ sessions per day.  http://orth.exer.us/622   Copyright  VHI. All rights reserved.  Slow Contraction: Gravity Resisted (Sitting)   Sitting, slowly squeeze pelvic floor for _8__ seconds. Rest for _10__ seconds. Repeat _10__ times. Do _3__ times a day. Then follow with 5 quick flicks. Copyright  VHI. All rights reserved.   [place] [address] [place], [place] [address] Phone # [phone] Fax [phone]

## 2014-10-21 ENCOUNTER — Encounter (HOSPITAL_COMMUNITY)
Admission: RE | Admit: 2014-10-21 | Discharge: 2014-10-21 | Disposition: A | Discharge status: Home / Self Care | Payer: Self-pay | Source: Ambulatory Visit | Attending: Internal Medicine | Admitting: Internal Medicine

## 2014-11-02 ENCOUNTER — Encounter (HOSPITAL_COMMUNITY)
Admission: RE | Admit: 2014-11-02 | Discharge: 2014-11-02 | Disposition: A | Discharge status: Home / Self Care | Payer: Self-pay | Source: Ambulatory Visit | Attending: Internal Medicine | Admitting: Internal Medicine

## 2014-11-02 ENCOUNTER — Encounter: Payer: Self-pay | Admitting: Physical Therapy

## 2014-11-02 ENCOUNTER — Ambulatory Visit: Payer: Medicare Other | Attending: Oncology | Admitting: Physical Therapy

## 2014-11-02 DIAGNOSIS — N8184 Pelvic muscle wasting: Secondary | ICD-10-CM | POA: Diagnosis present

## 2014-11-02 DIAGNOSIS — I10 Essential (primary) hypertension: Secondary | ICD-10-CM | POA: Insufficient documentation

## 2014-11-02 DIAGNOSIS — Z5189 Encounter for other specified aftercare: Secondary | ICD-10-CM | POA: Insufficient documentation

## 2014-11-02 DIAGNOSIS — M6289 Other specified disorders of muscle: Secondary | ICD-10-CM

## 2014-11-02 DIAGNOSIS — I5033 Acute on chronic diastolic (congestive) heart failure: Secondary | ICD-10-CM | POA: Insufficient documentation

## 2014-11-02 NOTE — Therapy (Addendum)
[facility] [address] [place], [place], [address] Phone: [phone]   Fax:  [phone]  Physical Therapy Treatment  Patient Details  Name: Meghan Wise MRN: [id number] Date of Birth: Mar 12, 1936 Referring Provider:  Trixie Dredge, NP  Encounter Date: 11/02/2014      PT End of Session - 11/02/14 1133    Visit Number 4   Number of Visits 10  Medicarer   Date for PT Re-Evaluation 11/23/14   PT Start Time 1100   PT Stop Time 1140   PT Time Calculation (min) 40 min   Activity Tolerance Patient tolerated treatment well   Behavior During Therapy [facility] for tasks assessed/performed      Past Medical History  Diagnosis Date  . Osteoporosis   . HTN (hypertension)   . Osteoarthritis   . Hypothyroidism   . Heart failure   . Hyperlipidemia   . Pulmonary hypertension   . Scleroderma     lungs and kidneys  . Raynaud disease     reactive with cold and stress mostly fingers.  . Kidney disease     Stage IV- Dr. Servando Salina -LOV 02-11-14  . Shortness of breath dyspnea     with exertion-in Cardiac rehab program at [facility].    Past Surgical History  Procedure Laterality Date  . Cholecystectomy, laparoscopic    . Breast biopsy      x 5  . Shoulder arthroscopy w/ rotator cuff repair Right   . Cardiac catheterization      2'15 [facility]  . Cholecystectomy    . Colonoscopy with propofol N/A 05/07/2014    Procedure: COLONOSCOPY WITH PROPOFOL;  Surgeon: Inda Castle, MD;  [facility]: [facility];  Service: Endoscopy;  Laterality: N/A;  . Esophagogastroduodenoscopy (egd) with propofol N/A 05/07/2014    Procedure: ESOPHAGOGASTRODUODENOSCOPY (EGD) WITH PROPOFOL;  Surgeon: Inda Castle, MD;  Location: [facility];  Service: Endoscopy;  Laterality: N/A;  . Hot hemostasis N/A 05/07/2014    Procedure: HOT HEMOSTASIS (ARGON PLASMA COAGULATION/BICAP);  Surgeon: Inda Castle, MD;  Location: [facility];  Service: Endoscopy;   Laterality: N/A;  deuodenal bulb    There were no vitals filed for this visit.  Visit Diagnosis:  PFD (pelvic floor dysfunction)      Subjective Assessment - 11/02/14 1104    Subjective I had several accidents of fecal incontinence and no sensation I was having it.  It happened several times friday and saturday.  I have gotten live cuture yogurt and is is better.  I am on a steroid for poison ivy.    Pertinent History Scleroderma; Osteoporosis   Patient Stated Goals Stop leakage   Currently in Pain? No/denies   Multiple Pain Sites No       G-code update due to patient not returning.  Therapist discretion due to patient not returning is 50% limitation.  Functional limitation is other primary. Goal status is CJ. And Discharge status is CK.  Earlie Counts, PT 12/17/2014 9:13 AM                    [facility] Adult PT Treatment/Exercise - 11/02/14 0001    Lumbar Exercises: Supine   Clam 10 reps  bil. with red theraband   Bent Knee Raise 10 reps  bil. with red theraband   Lumbar Exercises: Sidelying   Hip Abduction 10 reps  bil. with red theraband  PT Education - 11/02/14 1149    Education provided Yes   Education Details education on how to fill out bowel diary, educated on neuromodulation stimultion for fecal incontinence, discussed diet changes that could cause fecal incontinence   Person(s) Educated Patient   Methods Explanation;Demonstration;Verbal cues   Comprehension Verbalized understanding;Returned demonstration          PT Short Term Goals - 11/02/14 1153    PT SHORT TERM GOAL #3   Title hold pelvic floor contraction 10 seconds in sidely at 3/5 strength due to increased endurance   Time 4   Period Weeks   Status On-going  5 seconds           PT Long Term Goals - 11/02/14 1153    PT LONG TERM GOAL #1   Title independent with HEP and understands how to progress herself   Time 8   Period Weeks   Status On-going  still  learning exercises   PT LONG TERM GOAL #2   Title pelvic floor strength 4/5 holding 10 seconds with puborectalis muscle able to pull forward   Time 8   Period Weeks   Status On-going  5 seconds   PT LONG TERM GOAL #3   Title bowel leakage improved >/= 75%   Time 8   Period Weeks   Status Achieved   PT LONG TERM GOAL #4   Title able to get to the bathroom without rushing due to improved pelvic floor strength   Time 8   Period Weeks   Status Achieved               Plan - 11/02/14 1150    Clinical Impression Statement Patient has had two days of fecal incontinence and no sensation about fecal incontinence. Patient has been concerned due to her not having a fecal episode since then.  Patient reports the only change she had was taking a steroid for  poison ivy.  Patient will do a bowel diary to see what changes their are. Patient will be assessed next time.    Pt will benefit from skilled therapeutic intervention in order to improve on the following deficits Decreased strength;Decreased activity tolerance;Decreased endurance   Rehab Potential Good   PT Frequency 1x / week   PT Duration 8 weeks   PT Treatment/Interventions Biofeedback;Therapeutic activities;Therapeutic exercise;Neuromuscular re-education;Manual techniques;Patient/family education   PT Next Visit Plan decide if renewal or discharge   PT Home Exercise Plan progress as needed   Consulted and Agree with Plan of Care Patient        Problem List Patient Active Problem List   Diagnosis Date Noted  . Benign neoplasm of colon 05/07/2014  . Diverticulosis of colon without hemorrhage 05/07/2014  . Internal bleeding hemorrhoids 05/07/2014  . AVM (arteriovenous malformation) of duodenum, acquired 05/07/2014  . Fecal incontinence 04/01/2014  . Dysphagia, pharyngoesophageal phase 04/01/2014  . Rectal bleeding 04/01/2014  . GERD (gastroesophageal reflux disease) 04/01/2014  . Anemia 04/01/2014  . Pulmonary nodules  08/26/2012  . Cough 08/12/2012  . Dyspnea on exertion 07/08/2012  . Acute on chronic diastolic CHF (congestive heart failure), NYHA class 4 05/28/2012  . Hypervitaminosis D 05/28/2012  . Hypokalemia 05/27/2012  . Hypomagnesemia 05/27/2012  . Mediastinal lymphadenopathy 05/25/2012  . Constipation 05/25/2012  . Dehydration 05/24/2012  . ARF (acute renal failure) 05/24/2012  . Hypercalcemia 05/24/2012  . CKD (chronic kidney disease) stage 2, GFR 60-89 ml/min 05/24/2012  . Osteoporosis   . HTN (hypertension)   .  Osteoarthritis   . Raynaud disease   . Hypothyroidism     Bowdy Bair,PT 11/02/2014, 11:54 AM  [facility] [address] [place], [place], [address] Phone: [phone]   Fax:  [phone]  PHYSICAL THERAPY DISCHARGE SUMMARY  Visits from Start of Care: 4  Current functional level related to goals / functional outcomes: Patient did not return to therapy therefore is not able to be reassessed.  Patient called on 11/23/2014 and her husband was going in the hospital.  Therapist called patient on 12/08/2014 and no return message.    Remaining deficits: See above.   Education / Equipment: HEP Plan:                                                    Patient goals were not met. Patient is being discharged due to not returning since the last visit.  Thank you for the referral.  Earlie Counts, PT 12/17/2014 9:15 AM  ?????

## 2014-11-12 ENCOUNTER — Ambulatory Visit
Admission: RE | Admit: 2014-11-12 | Discharge: 2014-11-12 | Disposition: A | Discharge status: Home / Self Care | Payer: Medicare Other | Source: Ambulatory Visit

## 2014-11-12 DIAGNOSIS — Z1231 Encounter for screening mammogram for malignant neoplasm of breast: Secondary | ICD-10-CM

## 2014-11-16 ENCOUNTER — Encounter (HOSPITAL_COMMUNITY)
Admission: RE | Admit: 2014-11-16 | Discharge: 2014-11-16 | Disposition: A | Discharge status: Home / Self Care | Payer: Self-pay | Source: Ambulatory Visit | Attending: Internal Medicine | Admitting: Internal Medicine

## 2014-11-19 ENCOUNTER — Ambulatory Visit: Payer: Self-pay | Admitting: Podiatry

## 2014-11-20 ENCOUNTER — Encounter (HOSPITAL_COMMUNITY)
Admission: RE | Admit: 2014-11-20 | Discharge: 2014-11-20 | Disposition: A | Discharge status: Home / Self Care | Payer: Self-pay | Source: Ambulatory Visit | Attending: Internal Medicine | Admitting: Internal Medicine

## 2014-11-23 ENCOUNTER — Encounter (HOSPITAL_COMMUNITY)
Admission: RE | Admit: 2014-11-23 | Discharge: 2014-11-23 | Disposition: A | Discharge status: Home / Self Care | Payer: Self-pay | Source: Ambulatory Visit | Attending: Internal Medicine | Admitting: Internal Medicine

## 2014-11-23 ENCOUNTER — Ambulatory Visit: Payer: Medicare Other | Admitting: Physical Therapy

## 2014-11-23 DIAGNOSIS — I10 Essential (primary) hypertension: Secondary | ICD-10-CM | POA: Insufficient documentation

## 2014-11-23 DIAGNOSIS — Z5189 Encounter for other specified aftercare: Secondary | ICD-10-CM | POA: Insufficient documentation

## 2014-11-23 DIAGNOSIS — I5033 Acute on chronic diastolic (congestive) heart failure: Secondary | ICD-10-CM | POA: Insufficient documentation

## 2014-11-25 ENCOUNTER — Encounter (HOSPITAL_COMMUNITY): Payer: Self-pay

## 2014-11-27 ENCOUNTER — Encounter (HOSPITAL_COMMUNITY): Payer: Self-pay

## 2014-11-30 ENCOUNTER — Encounter (HOSPITAL_COMMUNITY): Payer: Self-pay

## 2014-12-02 ENCOUNTER — Encounter (HOSPITAL_COMMUNITY): Payer: Self-pay

## 2014-12-04 ENCOUNTER — Encounter (HOSPITAL_COMMUNITY): Payer: Self-pay

## 2014-12-07 ENCOUNTER — Encounter (HOSPITAL_COMMUNITY): Payer: Self-pay

## 2014-12-09 ENCOUNTER — Encounter (HOSPITAL_COMMUNITY): Payer: Self-pay

## 2014-12-11 ENCOUNTER — Encounter (HOSPITAL_COMMUNITY): Payer: Self-pay

## 2014-12-14 ENCOUNTER — Encounter (HOSPITAL_COMMUNITY)
Admission: RE | Admit: 2014-12-14 | Discharge: 2014-12-14 | Disposition: A | Discharge status: Home / Self Care | Payer: Self-pay | Source: Ambulatory Visit | Attending: Internal Medicine | Admitting: Internal Medicine

## 2014-12-16 ENCOUNTER — Encounter (HOSPITAL_COMMUNITY)
Admission: RE | Admit: 2014-12-16 | Discharge: 2014-12-16 | Disposition: A | Discharge status: Home / Self Care | Payer: Self-pay | Source: Ambulatory Visit | Attending: Internal Medicine | Admitting: Internal Medicine

## 2014-12-18 ENCOUNTER — Encounter (HOSPITAL_COMMUNITY): Payer: Self-pay

## 2014-12-21 ENCOUNTER — Encounter (HOSPITAL_COMMUNITY): Payer: Self-pay

## 2014-12-23 ENCOUNTER — Encounter (HOSPITAL_COMMUNITY): Payer: Self-pay

## 2014-12-25 ENCOUNTER — Encounter (HOSPITAL_COMMUNITY)
Admission: RE | Admit: 2014-12-25 | Discharge: 2014-12-25 | Disposition: A | Discharge status: Home / Self Care | Payer: Self-pay | Source: Ambulatory Visit | Attending: Internal Medicine | Admitting: Internal Medicine

## 2014-12-25 DIAGNOSIS — I10 Essential (primary) hypertension: Secondary | ICD-10-CM | POA: Insufficient documentation

## 2014-12-25 DIAGNOSIS — Z5189 Encounter for other specified aftercare: Secondary | ICD-10-CM | POA: Insufficient documentation

## 2014-12-25 DIAGNOSIS — I5033 Acute on chronic diastolic (congestive) heart failure: Secondary | ICD-10-CM | POA: Insufficient documentation

## 2014-12-28 ENCOUNTER — Encounter (HOSPITAL_COMMUNITY): Payer: Self-pay

## 2014-12-30 ENCOUNTER — Encounter (HOSPITAL_COMMUNITY)
Admission: RE | Admit: 2014-12-30 | Discharge: 2014-12-30 | Disposition: A | Discharge status: Home / Self Care | Payer: Self-pay | Source: Ambulatory Visit | Attending: Internal Medicine | Admitting: Internal Medicine

## 2015-01-01 ENCOUNTER — Encounter (HOSPITAL_COMMUNITY): Payer: Self-pay

## 2015-01-04 ENCOUNTER — Encounter (HOSPITAL_COMMUNITY): Payer: Self-pay

## 2015-01-06 ENCOUNTER — Encounter (HOSPITAL_COMMUNITY)
Admission: RE | Admit: 2015-01-06 | Discharge: 2015-01-06 | Disposition: A | Discharge status: Home / Self Care | Payer: Self-pay | Source: Ambulatory Visit | Attending: Internal Medicine | Admitting: Internal Medicine

## 2015-01-08 ENCOUNTER — Encounter (HOSPITAL_COMMUNITY)
Admission: RE | Admit: 2015-01-08 | Discharge: 2015-01-08 | Disposition: A | Discharge status: Home / Self Care | Payer: Self-pay | Source: Ambulatory Visit | Attending: Internal Medicine | Admitting: Internal Medicine

## 2015-01-11 ENCOUNTER — Encounter (HOSPITAL_COMMUNITY): Payer: Self-pay

## 2015-01-13 ENCOUNTER — Encounter (HOSPITAL_COMMUNITY)
Admission: RE | Admit: 2015-01-13 | Discharge: 2015-01-13 | Disposition: A | Discharge status: Home / Self Care | Payer: Self-pay | Source: Ambulatory Visit | Attending: Internal Medicine | Admitting: Internal Medicine

## 2015-01-15 ENCOUNTER — Encounter (HOSPITAL_COMMUNITY): Payer: Self-pay

## 2015-01-18 ENCOUNTER — Encounter (HOSPITAL_COMMUNITY): Payer: Self-pay

## 2015-01-20 ENCOUNTER — Encounter (HOSPITAL_COMMUNITY): Payer: Self-pay

## 2015-01-22 ENCOUNTER — Encounter (HOSPITAL_COMMUNITY)
Admission: RE | Admit: 2015-01-22 | Discharge: 2015-01-22 | Disposition: A | Discharge status: Home / Self Care | Payer: Self-pay | Source: Ambulatory Visit | Attending: Internal Medicine | Admitting: Internal Medicine

## 2015-01-25 ENCOUNTER — Encounter (HOSPITAL_COMMUNITY)
Admission: RE | Admit: 2015-01-25 | Discharge: 2015-01-25 | Disposition: A | Discharge status: Home / Self Care | Payer: Self-pay | Source: Ambulatory Visit | Attending: Internal Medicine | Admitting: Internal Medicine

## 2015-01-25 DIAGNOSIS — I5033 Acute on chronic diastolic (congestive) heart failure: Secondary | ICD-10-CM | POA: Insufficient documentation

## 2015-01-25 DIAGNOSIS — Z5189 Encounter for other specified aftercare: Secondary | ICD-10-CM | POA: Insufficient documentation

## 2015-01-25 DIAGNOSIS — I10 Essential (primary) hypertension: Secondary | ICD-10-CM | POA: Insufficient documentation

## 2015-01-27 ENCOUNTER — Encounter (HOSPITAL_COMMUNITY)
Admission: RE | Admit: 2015-01-27 | Discharge: 2015-01-27 | Disposition: A | Discharge status: Home / Self Care | Payer: Self-pay | Source: Ambulatory Visit | Attending: Internal Medicine | Admitting: Internal Medicine

## 2015-01-29 ENCOUNTER — Encounter (HOSPITAL_COMMUNITY): Payer: Self-pay

## 2015-02-01 ENCOUNTER — Encounter (HOSPITAL_COMMUNITY)
Admission: RE | Admit: 2015-02-01 | Discharge: 2015-02-01 | Disposition: A | Discharge status: Home / Self Care | Payer: Self-pay | Source: Ambulatory Visit | Attending: Internal Medicine | Admitting: Internal Medicine

## 2015-02-03 ENCOUNTER — Encounter (HOSPITAL_COMMUNITY): Payer: Self-pay

## 2015-02-05 ENCOUNTER — Encounter (HOSPITAL_COMMUNITY): Payer: Self-pay

## 2015-02-08 ENCOUNTER — Encounter (HOSPITAL_COMMUNITY)
Admission: RE | Admit: 2015-02-08 | Discharge: 2015-02-08 | Disposition: A | Discharge status: Home / Self Care | Payer: Self-pay | Source: Ambulatory Visit | Attending: Internal Medicine | Admitting: Internal Medicine

## 2015-02-10 ENCOUNTER — Encounter (HOSPITAL_COMMUNITY)
Admission: RE | Admit: 2015-02-10 | Discharge: 2015-02-10 | Disposition: A | Discharge status: Home / Self Care | Payer: Self-pay | Source: Ambulatory Visit | Attending: Internal Medicine | Admitting: Internal Medicine

## 2015-02-12 ENCOUNTER — Encounter (HOSPITAL_COMMUNITY): Payer: Self-pay

## 2015-02-15 ENCOUNTER — Encounter (HOSPITAL_COMMUNITY): Payer: Self-pay

## 2015-02-17 ENCOUNTER — Encounter (HOSPITAL_COMMUNITY): Payer: Self-pay

## 2015-02-19 ENCOUNTER — Encounter (HOSPITAL_COMMUNITY): Payer: Self-pay

## 2015-02-19 ENCOUNTER — Other Ambulatory Visit (HOSPITAL_COMMUNITY): Payer: Self-pay | Admitting: Family Medicine

## 2015-02-19 DIAGNOSIS — R599 Enlarged lymph nodes, unspecified: Secondary | ICD-10-CM

## 2015-02-19 DIAGNOSIS — R591 Generalized enlarged lymph nodes: Secondary | ICD-10-CM

## 2015-02-22 ENCOUNTER — Encounter (HOSPITAL_COMMUNITY): Payer: Self-pay

## 2015-02-23 ENCOUNTER — Ambulatory Visit (HOSPITAL_COMMUNITY)
Admission: RE | Admit: 2015-02-23 | Discharge: 2015-02-23 | Disposition: A | Discharge status: Home / Self Care | Payer: Medicare Other | Source: Ambulatory Visit | Attending: Family Medicine | Admitting: Family Medicine

## 2015-02-23 DIAGNOSIS — R0602 Shortness of breath: Secondary | ICD-10-CM | POA: Insufficient documentation

## 2015-02-23 DIAGNOSIS — R591 Generalized enlarged lymph nodes: Secondary | ICD-10-CM | POA: Insufficient documentation

## 2015-02-23 DIAGNOSIS — R599 Enlarged lymph nodes, unspecified: Secondary | ICD-10-CM

## 2015-02-24 ENCOUNTER — Encounter (HOSPITAL_COMMUNITY): Payer: Self-pay

## 2015-02-24 DIAGNOSIS — I10 Essential (primary) hypertension: Secondary | ICD-10-CM | POA: Insufficient documentation

## 2015-02-24 DIAGNOSIS — I5033 Acute on chronic diastolic (congestive) heart failure: Secondary | ICD-10-CM | POA: Insufficient documentation

## 2015-02-24 DIAGNOSIS — Z5189 Encounter for other specified aftercare: Secondary | ICD-10-CM | POA: Insufficient documentation

## 2015-02-26 ENCOUNTER — Encounter (HOSPITAL_COMMUNITY)
Admission: RE | Admit: 2015-02-26 | Discharge: 2015-02-26 | Disposition: A | Discharge status: Home / Self Care | Payer: Self-pay | Source: Ambulatory Visit | Attending: Internal Medicine | Admitting: Internal Medicine

## 2015-03-01 ENCOUNTER — Encounter (HOSPITAL_COMMUNITY): Payer: Self-pay

## 2015-03-03 ENCOUNTER — Encounter (HOSPITAL_COMMUNITY): Payer: Self-pay

## 2015-03-05 ENCOUNTER — Encounter (HOSPITAL_COMMUNITY): Payer: Self-pay

## 2015-03-08 ENCOUNTER — Encounter (HOSPITAL_COMMUNITY)
Admission: RE | Admit: 2015-03-08 | Discharge: 2015-03-08 | Disposition: A | Discharge status: Home / Self Care | Payer: Self-pay | Source: Ambulatory Visit | Attending: Internal Medicine | Admitting: Internal Medicine

## 2015-03-10 ENCOUNTER — Encounter (HOSPITAL_COMMUNITY): Payer: Self-pay

## 2015-03-12 ENCOUNTER — Encounter (HOSPITAL_COMMUNITY): Payer: Self-pay

## 2015-03-15 ENCOUNTER — Encounter (HOSPITAL_COMMUNITY): Payer: Self-pay

## 2015-03-17 ENCOUNTER — Encounter (HOSPITAL_COMMUNITY): Payer: Self-pay

## 2015-03-24 ENCOUNTER — Encounter (HOSPITAL_COMMUNITY)
Admission: RE | Admit: 2015-03-24 | Discharge: 2015-03-24 | Disposition: A | Discharge status: Home / Self Care | Payer: Self-pay | Source: Ambulatory Visit | Attending: Internal Medicine | Admitting: Internal Medicine

## 2015-03-26 ENCOUNTER — Encounter (HOSPITAL_COMMUNITY)
Admission: RE | Admit: 2015-03-26 | Discharge: 2015-03-26 | Disposition: A | Discharge status: Home / Self Care | Payer: Self-pay | Source: Ambulatory Visit | Attending: Internal Medicine | Admitting: Internal Medicine

## 2015-03-26 DIAGNOSIS — Z5189 Encounter for other specified aftercare: Secondary | ICD-10-CM | POA: Insufficient documentation

## 2015-03-26 DIAGNOSIS — I5033 Acute on chronic diastolic (congestive) heart failure: Secondary | ICD-10-CM | POA: Insufficient documentation

## 2015-03-26 DIAGNOSIS — I10 Essential (primary) hypertension: Secondary | ICD-10-CM | POA: Insufficient documentation

## 2015-03-29 ENCOUNTER — Encounter (HOSPITAL_COMMUNITY)
Admission: RE | Admit: 2015-03-29 | Discharge: 2015-03-29 | Disposition: A | Discharge status: Home / Self Care | Payer: Self-pay | Source: Ambulatory Visit | Attending: Internal Medicine | Admitting: Internal Medicine

## 2015-03-31 ENCOUNTER — Encounter (HOSPITAL_COMMUNITY): Payer: Self-pay

## 2015-04-02 ENCOUNTER — Encounter (HOSPITAL_COMMUNITY): Payer: Self-pay

## 2015-04-05 ENCOUNTER — Encounter (HOSPITAL_COMMUNITY): Payer: Self-pay

## 2015-04-07 ENCOUNTER — Encounter (HOSPITAL_COMMUNITY): Payer: Self-pay

## 2015-04-09 ENCOUNTER — Encounter (HOSPITAL_COMMUNITY): Payer: Self-pay

## 2015-04-12 ENCOUNTER — Encounter (HOSPITAL_COMMUNITY): Admission: RE | Admit: 2015-04-12 | Payer: Self-pay | Source: Ambulatory Visit

## 2015-04-14 ENCOUNTER — Encounter (HOSPITAL_COMMUNITY): Payer: Self-pay

## 2015-04-16 ENCOUNTER — Encounter (HOSPITAL_COMMUNITY): Payer: Self-pay

## 2015-04-19 ENCOUNTER — Encounter (HOSPITAL_COMMUNITY): Payer: Self-pay

## 2015-04-21 ENCOUNTER — Encounter (HOSPITAL_COMMUNITY)
Admission: RE | Admit: 2015-04-21 | Discharge: 2015-04-21 | Disposition: A | Discharge status: Home / Self Care | Payer: Self-pay | Source: Ambulatory Visit | Attending: Internal Medicine | Admitting: Internal Medicine

## 2015-04-23 ENCOUNTER — Encounter (HOSPITAL_COMMUNITY): Payer: Self-pay

## 2015-04-26 ENCOUNTER — Encounter (HOSPITAL_COMMUNITY): Payer: Self-pay

## 2015-04-26 DIAGNOSIS — I10 Essential (primary) hypertension: Secondary | ICD-10-CM | POA: Insufficient documentation

## 2015-04-26 DIAGNOSIS — Z5189 Encounter for other specified aftercare: Secondary | ICD-10-CM | POA: Insufficient documentation

## 2015-04-26 DIAGNOSIS — I5033 Acute on chronic diastolic (congestive) heart failure: Secondary | ICD-10-CM | POA: Insufficient documentation

## 2015-04-28 ENCOUNTER — Encounter (HOSPITAL_COMMUNITY)
Admission: RE | Admit: 2015-04-28 | Discharge: 2015-04-28 | Disposition: A | Discharge status: Home / Self Care | Payer: Self-pay | Source: Ambulatory Visit | Attending: Internal Medicine | Admitting: Internal Medicine

## 2015-04-30 ENCOUNTER — Encounter (HOSPITAL_COMMUNITY): Payer: Self-pay

## 2015-05-03 ENCOUNTER — Encounter (HOSPITAL_COMMUNITY): Payer: Self-pay

## 2015-05-05 ENCOUNTER — Encounter (HOSPITAL_COMMUNITY): Payer: Self-pay

## 2015-05-07 ENCOUNTER — Encounter (HOSPITAL_COMMUNITY)
Admission: RE | Admit: 2015-05-07 | Discharge: 2015-05-07 | Disposition: A | Discharge status: Home / Self Care | Payer: Self-pay | Source: Ambulatory Visit | Attending: Internal Medicine | Admitting: Internal Medicine

## 2015-05-10 ENCOUNTER — Encounter (HOSPITAL_COMMUNITY)
Admission: RE | Admit: 2015-05-10 | Discharge: 2015-05-10 | Disposition: A | Discharge status: Home / Self Care | Payer: Self-pay | Source: Ambulatory Visit | Attending: Internal Medicine | Admitting: Internal Medicine

## 2015-05-12 ENCOUNTER — Encounter (HOSPITAL_COMMUNITY)
Admission: RE | Admit: 2015-05-12 | Discharge: 2015-05-12 | Disposition: A | Discharge status: Home / Self Care | Payer: Self-pay | Source: Ambulatory Visit | Attending: Internal Medicine | Admitting: Internal Medicine

## 2015-05-14 ENCOUNTER — Encounter (HOSPITAL_COMMUNITY): Payer: Self-pay

## 2015-05-17 ENCOUNTER — Encounter (HOSPITAL_COMMUNITY): Payer: Self-pay

## 2015-05-19 ENCOUNTER — Encounter (HOSPITAL_COMMUNITY)
Admission: RE | Admit: 2015-05-19 | Discharge: 2015-05-19 | Disposition: A | Discharge status: Home / Self Care | Payer: Self-pay | Source: Ambulatory Visit | Attending: Internal Medicine | Admitting: Internal Medicine

## 2015-05-21 ENCOUNTER — Encounter (HOSPITAL_COMMUNITY)
Admission: RE | Admit: 2015-05-21 | Discharge: 2015-05-21 | Disposition: A | Discharge status: Home / Self Care | Payer: Self-pay | Source: Ambulatory Visit | Attending: Internal Medicine | Admitting: Internal Medicine

## 2015-05-24 ENCOUNTER — Encounter (HOSPITAL_COMMUNITY): Payer: Self-pay

## 2015-05-26 ENCOUNTER — Encounter (HOSPITAL_COMMUNITY)
Admission: RE | Admit: 2015-05-26 | Discharge: 2015-05-26 | Disposition: A | Discharge status: Home / Self Care | Payer: Self-pay | Source: Ambulatory Visit | Attending: Internal Medicine | Admitting: Internal Medicine

## 2015-05-26 DIAGNOSIS — I10 Essential (primary) hypertension: Secondary | ICD-10-CM | POA: Insufficient documentation

## 2015-05-26 DIAGNOSIS — Z5189 Encounter for other specified aftercare: Secondary | ICD-10-CM | POA: Insufficient documentation

## 2015-05-26 DIAGNOSIS — I5033 Acute on chronic diastolic (congestive) heart failure: Secondary | ICD-10-CM | POA: Insufficient documentation

## 2015-05-28 ENCOUNTER — Encounter (HOSPITAL_COMMUNITY): Payer: Self-pay

## 2015-05-31 ENCOUNTER — Encounter (HOSPITAL_COMMUNITY): Payer: Self-pay

## 2015-06-02 ENCOUNTER — Encounter (HOSPITAL_COMMUNITY)
Admission: RE | Admit: 2015-06-02 | Discharge: 2015-06-02 | Disposition: A | Discharge status: Home / Self Care | Payer: Self-pay | Source: Ambulatory Visit | Attending: Internal Medicine | Admitting: Internal Medicine

## 2015-06-04 ENCOUNTER — Encounter (HOSPITAL_COMMUNITY)
Admission: RE | Admit: 2015-06-04 | Discharge: 2015-06-04 | Disposition: A | Discharge status: Home / Self Care | Payer: Self-pay | Source: Ambulatory Visit | Attending: Internal Medicine | Admitting: Internal Medicine

## 2015-06-07 ENCOUNTER — Encounter (HOSPITAL_COMMUNITY): Payer: Self-pay

## 2015-06-09 ENCOUNTER — Encounter (HOSPITAL_COMMUNITY)
Admission: RE | Admit: 2015-06-09 | Discharge: 2015-06-09 | Disposition: A | Discharge status: Home / Self Care | Payer: Self-pay | Source: Ambulatory Visit | Attending: Internal Medicine | Admitting: Internal Medicine

## 2015-06-11 ENCOUNTER — Encounter (HOSPITAL_COMMUNITY): Payer: Self-pay

## 2015-06-14 ENCOUNTER — Encounter (HOSPITAL_COMMUNITY)
Admission: RE | Admit: 2015-06-14 | Discharge: 2015-06-14 | Disposition: A | Discharge status: Home / Self Care | Payer: Self-pay | Source: Ambulatory Visit | Attending: Internal Medicine | Admitting: Internal Medicine

## 2015-06-16 ENCOUNTER — Encounter (HOSPITAL_COMMUNITY)
Admission: RE | Admit: 2015-06-16 | Discharge: 2015-06-16 | Disposition: A | Discharge status: Home / Self Care | Payer: Self-pay | Source: Ambulatory Visit | Attending: Internal Medicine | Admitting: Internal Medicine

## 2015-06-18 ENCOUNTER — Encounter (HOSPITAL_COMMUNITY): Payer: Self-pay

## 2015-06-21 ENCOUNTER — Encounter (HOSPITAL_COMMUNITY): Payer: Self-pay

## 2015-06-23 ENCOUNTER — Encounter (HOSPITAL_COMMUNITY): Payer: Self-pay

## 2015-06-23 DIAGNOSIS — I5033 Acute on chronic diastolic (congestive) heart failure: Secondary | ICD-10-CM | POA: Insufficient documentation

## 2015-06-23 DIAGNOSIS — I10 Essential (primary) hypertension: Secondary | ICD-10-CM | POA: Insufficient documentation

## 2015-06-23 DIAGNOSIS — Z5189 Encounter for other specified aftercare: Secondary | ICD-10-CM | POA: Insufficient documentation

## 2015-06-25 ENCOUNTER — Encounter (HOSPITAL_COMMUNITY): Payer: Self-pay

## 2015-06-28 ENCOUNTER — Encounter (HOSPITAL_COMMUNITY)
Admission: RE | Admit: 2015-06-28 | Discharge: 2015-06-28 | Disposition: A | Discharge status: Home / Self Care | Payer: Self-pay | Source: Ambulatory Visit | Attending: Internal Medicine | Admitting: Internal Medicine

## 2015-06-30 ENCOUNTER — Encounter (HOSPITAL_COMMUNITY)
Admission: RE | Admit: 2015-06-30 | Discharge: 2015-06-30 | Disposition: A | Discharge status: Home / Self Care | Payer: Self-pay | Source: Ambulatory Visit | Attending: Internal Medicine | Admitting: Internal Medicine

## 2015-07-02 ENCOUNTER — Encounter (HOSPITAL_COMMUNITY)
Admission: RE | Admit: 2015-07-02 | Discharge: 2015-07-02 | Disposition: A | Discharge status: Home / Self Care | Payer: Self-pay | Source: Ambulatory Visit | Attending: Internal Medicine | Admitting: Internal Medicine

## 2015-07-05 ENCOUNTER — Encounter (HOSPITAL_COMMUNITY): Payer: Self-pay

## 2015-07-07 ENCOUNTER — Encounter (HOSPITAL_COMMUNITY): Payer: Self-pay

## 2015-07-09 ENCOUNTER — Encounter (HOSPITAL_COMMUNITY): Payer: Self-pay

## 2015-07-12 ENCOUNTER — Encounter (HOSPITAL_COMMUNITY)
Admission: RE | Admit: 2015-07-12 | Discharge: 2015-07-12 | Disposition: A | Discharge status: Home / Self Care | Payer: Medicare Other | Source: Ambulatory Visit | Attending: Internal Medicine | Admitting: Internal Medicine

## 2015-07-14 ENCOUNTER — Encounter (HOSPITAL_COMMUNITY)
Admission: RE | Admit: 2015-07-14 | Discharge: 2015-07-14 | Disposition: A | Discharge status: Home / Self Care | Payer: Self-pay | Source: Ambulatory Visit | Attending: Internal Medicine | Admitting: Internal Medicine

## 2015-07-16 ENCOUNTER — Encounter (HOSPITAL_COMMUNITY): Payer: Self-pay

## 2015-07-19 ENCOUNTER — Encounter (HOSPITAL_COMMUNITY): Payer: Self-pay

## 2015-07-21 ENCOUNTER — Encounter (HOSPITAL_COMMUNITY)
Admission: RE | Admit: 2015-07-21 | Discharge: 2015-07-21 | Disposition: A | Discharge status: Home / Self Care | Payer: Self-pay | Source: Ambulatory Visit | Attending: Internal Medicine | Admitting: Internal Medicine

## 2015-07-23 ENCOUNTER — Encounter (HOSPITAL_COMMUNITY): Payer: Self-pay

## 2015-07-26 ENCOUNTER — Encounter (HOSPITAL_COMMUNITY): Payer: Self-pay

## 2015-07-26 DIAGNOSIS — I10 Essential (primary) hypertension: Secondary | ICD-10-CM | POA: Insufficient documentation

## 2015-07-26 DIAGNOSIS — Z5189 Encounter for other specified aftercare: Secondary | ICD-10-CM | POA: Insufficient documentation

## 2015-07-26 DIAGNOSIS — I5033 Acute on chronic diastolic (congestive) heart failure: Secondary | ICD-10-CM | POA: Insufficient documentation

## 2015-07-28 ENCOUNTER — Encounter (HOSPITAL_COMMUNITY): Payer: Self-pay

## 2015-07-30 ENCOUNTER — Encounter (HOSPITAL_COMMUNITY): Payer: Self-pay

## 2015-08-02 ENCOUNTER — Encounter (HOSPITAL_COMMUNITY)
Admission: RE | Admit: 2015-08-02 | Discharge: 2015-08-02 | Disposition: A | Discharge status: Home / Self Care | Payer: Self-pay | Source: Ambulatory Visit | Attending: Internal Medicine | Admitting: Internal Medicine

## 2015-08-04 ENCOUNTER — Encounter (HOSPITAL_COMMUNITY)
Admission: RE | Admit: 2015-08-04 | Discharge: 2015-08-04 | Disposition: A | Discharge status: Home / Self Care | Payer: Self-pay | Source: Ambulatory Visit | Attending: Internal Medicine | Admitting: Internal Medicine

## 2015-08-06 ENCOUNTER — Encounter (HOSPITAL_COMMUNITY): Payer: Self-pay

## 2015-08-09 ENCOUNTER — Encounter (HOSPITAL_COMMUNITY): Payer: Self-pay

## 2015-08-11 ENCOUNTER — Encounter (HOSPITAL_COMMUNITY): Payer: Self-pay

## 2015-08-13 ENCOUNTER — Encounter (HOSPITAL_COMMUNITY): Payer: Self-pay

## 2015-08-16 ENCOUNTER — Encounter (HOSPITAL_COMMUNITY): Payer: Self-pay

## 2015-08-27 ENCOUNTER — Encounter (HOSPITAL_COMMUNITY): Payer: Self-pay

## 2015-08-27 DIAGNOSIS — Z5189 Encounter for other specified aftercare: Secondary | ICD-10-CM | POA: Insufficient documentation

## 2015-08-27 DIAGNOSIS — I10 Essential (primary) hypertension: Secondary | ICD-10-CM | POA: Insufficient documentation

## 2015-08-27 DIAGNOSIS — I5033 Acute on chronic diastolic (congestive) heart failure: Secondary | ICD-10-CM | POA: Insufficient documentation

## 2015-08-30 ENCOUNTER — Encounter (HOSPITAL_COMMUNITY)
Admission: RE | Admit: 2015-08-30 | Discharge: 2015-08-30 | Disposition: A | Discharge status: Home / Self Care | Payer: Self-pay | Source: Ambulatory Visit | Attending: Internal Medicine | Admitting: Internal Medicine

## 2015-09-01 ENCOUNTER — Encounter (HOSPITAL_COMMUNITY)
Admission: RE | Admit: 2015-09-01 | Discharge: 2015-09-01 | Disposition: A | Discharge status: Home / Self Care | Payer: Self-pay | Source: Ambulatory Visit | Attending: Internal Medicine | Admitting: Internal Medicine

## 2015-09-03 ENCOUNTER — Encounter (HOSPITAL_COMMUNITY): Payer: Self-pay

## 2015-09-06 ENCOUNTER — Encounter (HOSPITAL_COMMUNITY): Payer: Self-pay

## 2015-09-07 ENCOUNTER — Encounter (HOSPITAL_COMMUNITY)
Admission: RE | Admit: 2015-09-07 | Discharge: 2015-09-07 | Disposition: A | Discharge status: Home / Self Care | Payer: Self-pay | Source: Ambulatory Visit | Attending: Internal Medicine | Admitting: Internal Medicine

## 2015-09-08 ENCOUNTER — Encounter (HOSPITAL_COMMUNITY): Payer: Self-pay

## 2015-09-09 ENCOUNTER — Encounter (HOSPITAL_COMMUNITY)
Admission: RE | Admit: 2015-09-09 | Discharge: 2015-09-09 | Disposition: A | Discharge status: Home / Self Care | Payer: Self-pay | Source: Ambulatory Visit | Attending: Internal Medicine | Admitting: Internal Medicine

## 2015-09-10 ENCOUNTER — Encounter (HOSPITAL_COMMUNITY): Payer: Self-pay

## 2015-09-13 ENCOUNTER — Encounter (HOSPITAL_COMMUNITY): Payer: Self-pay

## 2015-09-14 ENCOUNTER — Encounter (HOSPITAL_COMMUNITY)
Admission: RE | Admit: 2015-09-14 | Discharge: 2015-09-14 | Disposition: A | Discharge status: Home / Self Care | Payer: Self-pay | Source: Ambulatory Visit | Attending: Internal Medicine | Admitting: Internal Medicine

## 2015-09-14 NOTE — Progress Notes (Signed)
Meghan Wise has changed to the Tuesday and Thursday Outpt. Pulmonary Maintenance classes as of 09/07/15. She is tolerating exercise well and this is working better for her schedule.

## 2015-09-15 ENCOUNTER — Encounter (HOSPITAL_COMMUNITY): Payer: Self-pay

## 2015-09-16 ENCOUNTER — Encounter (HOSPITAL_COMMUNITY)
Admission: RE | Admit: 2015-09-16 | Discharge: 2015-09-16 | Disposition: A | Discharge status: Home / Self Care | Payer: Self-pay | Source: Ambulatory Visit | Attending: Internal Medicine | Admitting: Internal Medicine

## 2015-09-17 ENCOUNTER — Encounter (HOSPITAL_COMMUNITY): Payer: Self-pay

## 2015-09-20 ENCOUNTER — Encounter (HOSPITAL_COMMUNITY): Payer: Self-pay

## 2015-09-21 ENCOUNTER — Encounter (HOSPITAL_COMMUNITY): Payer: Self-pay

## 2015-09-22 ENCOUNTER — Encounter (HOSPITAL_COMMUNITY): Payer: Self-pay

## 2015-09-23 ENCOUNTER — Encounter (HOSPITAL_COMMUNITY): Payer: Self-pay

## 2015-09-23 DIAGNOSIS — I5033 Acute on chronic diastolic (congestive) heart failure: Secondary | ICD-10-CM | POA: Insufficient documentation

## 2015-09-23 DIAGNOSIS — I10 Essential (primary) hypertension: Secondary | ICD-10-CM | POA: Insufficient documentation

## 2015-09-23 DIAGNOSIS — Z5189 Encounter for other specified aftercare: Secondary | ICD-10-CM | POA: Insufficient documentation

## 2015-09-24 ENCOUNTER — Encounter (HOSPITAL_COMMUNITY): Payer: Self-pay

## 2015-09-27 ENCOUNTER — Encounter (HOSPITAL_COMMUNITY): Payer: Self-pay

## 2015-09-28 ENCOUNTER — Encounter (HOSPITAL_COMMUNITY): Payer: Self-pay

## 2015-09-29 ENCOUNTER — Encounter (HOSPITAL_COMMUNITY): Payer: Self-pay

## 2015-09-30 ENCOUNTER — Encounter (HOSPITAL_COMMUNITY)
Admission: RE | Admit: 2015-09-30 | Discharge: 2015-09-30 | Disposition: A | Discharge status: Home / Self Care | Payer: Self-pay | Source: Ambulatory Visit | Attending: Internal Medicine | Admitting: Internal Medicine

## 2015-10-01 ENCOUNTER — Encounter (HOSPITAL_COMMUNITY): Payer: Self-pay

## 2015-10-04 ENCOUNTER — Encounter (HOSPITAL_COMMUNITY): Payer: Self-pay

## 2015-10-05 ENCOUNTER — Encounter (HOSPITAL_COMMUNITY): Payer: Self-pay

## 2015-10-06 ENCOUNTER — Encounter (HOSPITAL_COMMUNITY): Payer: Self-pay

## 2015-10-07 ENCOUNTER — Encounter (HOSPITAL_COMMUNITY)
Admission: RE | Admit: 2015-10-07 | Discharge: 2015-10-07 | Disposition: A | Discharge status: Home / Self Care | Payer: Self-pay | Source: Ambulatory Visit | Attending: Internal Medicine | Admitting: Internal Medicine

## 2015-10-08 ENCOUNTER — Encounter (HOSPITAL_COMMUNITY): Payer: Self-pay

## 2015-10-11 ENCOUNTER — Encounter (HOSPITAL_COMMUNITY): Payer: Self-pay

## 2015-10-12 ENCOUNTER — Encounter (HOSPITAL_COMMUNITY)
Admission: RE | Admit: 2015-10-12 | Discharge: 2015-10-12 | Disposition: A | Discharge status: Home / Self Care | Payer: Self-pay | Source: Ambulatory Visit | Attending: Internal Medicine | Admitting: Internal Medicine

## 2015-10-13 ENCOUNTER — Encounter (HOSPITAL_COMMUNITY): Payer: Self-pay

## 2015-10-14 ENCOUNTER — Encounter (HOSPITAL_COMMUNITY): Payer: Self-pay

## 2015-10-15 ENCOUNTER — Encounter (HOSPITAL_COMMUNITY): Payer: Self-pay

## 2015-10-18 ENCOUNTER — Encounter (HOSPITAL_COMMUNITY): Payer: Self-pay

## 2015-10-19 ENCOUNTER — Encounter (HOSPITAL_COMMUNITY)
Admission: RE | Admit: 2015-10-19 | Discharge: 2015-10-19 | Disposition: A | Discharge status: Home / Self Care | Payer: Self-pay | Source: Ambulatory Visit | Attending: Internal Medicine | Admitting: Internal Medicine

## 2015-10-20 ENCOUNTER — Encounter (HOSPITAL_COMMUNITY): Payer: Self-pay

## 2015-10-21 ENCOUNTER — Encounter (HOSPITAL_COMMUNITY)
Admission: RE | Admit: 2015-10-21 | Discharge: 2015-10-21 | Disposition: A | Discharge status: Home / Self Care | Payer: Self-pay | Source: Ambulatory Visit | Attending: Internal Medicine | Admitting: Internal Medicine

## 2015-10-22 ENCOUNTER — Encounter (HOSPITAL_COMMUNITY): Payer: Self-pay

## 2015-10-25 ENCOUNTER — Encounter (HOSPITAL_COMMUNITY): Payer: Self-pay

## 2015-10-26 ENCOUNTER — Encounter (HOSPITAL_COMMUNITY): Payer: Self-pay

## 2015-10-26 DIAGNOSIS — I10 Essential (primary) hypertension: Secondary | ICD-10-CM | POA: Insufficient documentation

## 2015-10-26 DIAGNOSIS — I5033 Acute on chronic diastolic (congestive) heart failure: Secondary | ICD-10-CM | POA: Insufficient documentation

## 2015-10-26 DIAGNOSIS — Z5189 Encounter for other specified aftercare: Secondary | ICD-10-CM | POA: Insufficient documentation

## 2015-10-27 ENCOUNTER — Encounter (HOSPITAL_COMMUNITY): Payer: Self-pay

## 2015-10-28 ENCOUNTER — Encounter (HOSPITAL_COMMUNITY): Payer: Self-pay

## 2015-10-29 ENCOUNTER — Encounter (HOSPITAL_COMMUNITY): Payer: Self-pay

## 2015-11-01 ENCOUNTER — Encounter (HOSPITAL_COMMUNITY): Payer: Self-pay

## 2015-11-02 ENCOUNTER — Encounter (HOSPITAL_COMMUNITY)
Admission: RE | Admit: 2015-11-02 | Discharge: 2015-11-02 | Disposition: A | Discharge status: Home / Self Care | Payer: Self-pay | Source: Ambulatory Visit | Attending: Internal Medicine | Admitting: Internal Medicine

## 2015-11-03 ENCOUNTER — Encounter (HOSPITAL_COMMUNITY): Payer: Self-pay

## 2015-11-04 ENCOUNTER — Encounter (HOSPITAL_COMMUNITY): Payer: Self-pay

## 2015-11-05 ENCOUNTER — Encounter (HOSPITAL_COMMUNITY): Payer: Self-pay

## 2015-11-08 ENCOUNTER — Encounter (HOSPITAL_COMMUNITY): Payer: Self-pay

## 2015-11-09 ENCOUNTER — Encounter (HOSPITAL_COMMUNITY)
Admission: RE | Admit: 2015-11-09 | Discharge: 2015-11-09 | Disposition: A | Discharge status: Home / Self Care | Payer: Self-pay | Source: Ambulatory Visit | Attending: Internal Medicine | Admitting: Internal Medicine

## 2015-11-10 ENCOUNTER — Encounter (HOSPITAL_COMMUNITY): Payer: Self-pay

## 2015-11-11 ENCOUNTER — Encounter (HOSPITAL_COMMUNITY): Payer: Self-pay

## 2015-11-12 ENCOUNTER — Encounter (HOSPITAL_COMMUNITY): Payer: Self-pay

## 2015-11-15 ENCOUNTER — Other Ambulatory Visit: Payer: Self-pay | Admitting: Family Medicine

## 2015-11-15 ENCOUNTER — Encounter (HOSPITAL_COMMUNITY): Payer: Self-pay

## 2015-11-15 DIAGNOSIS — Z1231 Encounter for screening mammogram for malignant neoplasm of breast: Secondary | ICD-10-CM

## 2015-11-16 ENCOUNTER — Encounter (HOSPITAL_COMMUNITY): Payer: Self-pay

## 2015-11-17 ENCOUNTER — Encounter (HOSPITAL_COMMUNITY): Payer: Self-pay

## 2015-11-18 ENCOUNTER — Encounter (HOSPITAL_COMMUNITY): Payer: Self-pay

## 2015-11-19 ENCOUNTER — Encounter (HOSPITAL_COMMUNITY): Payer: Self-pay

## 2015-11-22 ENCOUNTER — Encounter (HOSPITAL_COMMUNITY): Payer: Self-pay

## 2015-11-23 ENCOUNTER — Encounter (HOSPITAL_COMMUNITY)
Admission: RE | Admit: 2015-11-23 | Discharge: 2015-11-23 | Disposition: A | Discharge status: Home / Self Care | Payer: Self-pay | Source: Ambulatory Visit | Attending: Internal Medicine | Admitting: Internal Medicine

## 2015-11-23 DIAGNOSIS — I5033 Acute on chronic diastolic (congestive) heart failure: Secondary | ICD-10-CM | POA: Insufficient documentation

## 2015-11-23 DIAGNOSIS — I10 Essential (primary) hypertension: Secondary | ICD-10-CM | POA: Insufficient documentation

## 2015-11-23 DIAGNOSIS — Z5189 Encounter for other specified aftercare: Secondary | ICD-10-CM | POA: Insufficient documentation

## 2015-11-24 ENCOUNTER — Encounter (HOSPITAL_COMMUNITY): Payer: Self-pay

## 2015-11-25 ENCOUNTER — Ambulatory Visit
Admission: RE | Admit: 2015-11-25 | Discharge: 2015-11-25 | Disposition: A | Discharge status: Home / Self Care | Payer: Medicare Other | Source: Ambulatory Visit | Attending: Family Medicine | Admitting: Family Medicine

## 2015-11-25 ENCOUNTER — Encounter (HOSPITAL_COMMUNITY)
Admission: RE | Admit: 2015-11-25 | Discharge: 2015-11-25 | Disposition: A | Discharge status: Home / Self Care | Payer: Self-pay | Source: Ambulatory Visit | Attending: Internal Medicine | Admitting: Internal Medicine

## 2015-11-25 DIAGNOSIS — Z1231 Encounter for screening mammogram for malignant neoplasm of breast: Secondary | ICD-10-CM

## 2015-11-26 ENCOUNTER — Encounter (HOSPITAL_COMMUNITY): Payer: Self-pay

## 2015-11-26 ENCOUNTER — Ambulatory Visit: Payer: Medicare Other

## 2015-11-29 ENCOUNTER — Encounter (HOSPITAL_COMMUNITY): Payer: Self-pay

## 2015-11-30 ENCOUNTER — Encounter (HOSPITAL_COMMUNITY)
Admission: RE | Admit: 2015-11-30 | Discharge: 2015-11-30 | Disposition: A | Discharge status: Home / Self Care | Payer: Self-pay | Source: Ambulatory Visit | Attending: Internal Medicine | Admitting: Internal Medicine

## 2015-12-01 ENCOUNTER — Encounter (HOSPITAL_COMMUNITY): Payer: Self-pay

## 2015-12-02 ENCOUNTER — Encounter (HOSPITAL_COMMUNITY)
Admission: RE | Admit: 2015-12-02 | Discharge: 2015-12-02 | Disposition: A | Discharge status: Home / Self Care | Payer: Self-pay | Source: Ambulatory Visit | Attending: Internal Medicine | Admitting: Internal Medicine

## 2015-12-03 ENCOUNTER — Encounter (HOSPITAL_COMMUNITY): Payer: Self-pay

## 2015-12-06 ENCOUNTER — Encounter (HOSPITAL_COMMUNITY): Payer: Self-pay

## 2015-12-07 ENCOUNTER — Encounter (HOSPITAL_COMMUNITY)
Admission: RE | Admit: 2015-12-07 | Discharge: 2015-12-07 | Disposition: A | Discharge status: Home / Self Care | Payer: Self-pay | Source: Ambulatory Visit | Attending: Internal Medicine | Admitting: Internal Medicine

## 2015-12-08 ENCOUNTER — Encounter (HOSPITAL_COMMUNITY): Payer: Self-pay

## 2015-12-09 ENCOUNTER — Encounter (HOSPITAL_COMMUNITY)
Admission: RE | Admit: 2015-12-09 | Discharge: 2015-12-09 | Disposition: A | Discharge status: Home / Self Care | Payer: Self-pay | Source: Ambulatory Visit | Attending: Internal Medicine | Admitting: Internal Medicine

## 2015-12-10 ENCOUNTER — Encounter (HOSPITAL_COMMUNITY): Payer: Self-pay

## 2015-12-13 ENCOUNTER — Encounter (HOSPITAL_COMMUNITY): Payer: Self-pay

## 2015-12-14 ENCOUNTER — Encounter (HOSPITAL_COMMUNITY)
Admission: RE | Admit: 2015-12-14 | Discharge: 2015-12-14 | Disposition: A | Discharge status: Home / Self Care | Payer: Self-pay | Source: Ambulatory Visit | Attending: Internal Medicine | Admitting: Internal Medicine

## 2015-12-15 ENCOUNTER — Encounter (HOSPITAL_COMMUNITY): Payer: Self-pay

## 2015-12-16 ENCOUNTER — Encounter (HOSPITAL_COMMUNITY): Payer: Self-pay

## 2015-12-17 ENCOUNTER — Encounter (HOSPITAL_COMMUNITY): Payer: Self-pay

## 2015-12-20 ENCOUNTER — Encounter (HOSPITAL_COMMUNITY): Payer: Self-pay

## 2015-12-21 ENCOUNTER — Encounter (HOSPITAL_COMMUNITY)
Admission: RE | Admit: 2015-12-21 | Discharge: 2015-12-21 | Disposition: A | Discharge status: Home / Self Care | Payer: Self-pay | Source: Ambulatory Visit | Attending: Internal Medicine | Admitting: Internal Medicine

## 2015-12-22 ENCOUNTER — Encounter (HOSPITAL_COMMUNITY): Payer: Self-pay

## 2015-12-23 ENCOUNTER — Encounter (HOSPITAL_COMMUNITY)
Admission: RE | Admit: 2015-12-23 | Discharge: 2015-12-23 | Disposition: A | Discharge status: Home / Self Care | Payer: Self-pay | Source: Ambulatory Visit | Attending: Internal Medicine | Admitting: Internal Medicine

## 2015-12-24 ENCOUNTER — Encounter (HOSPITAL_COMMUNITY): Payer: Self-pay

## 2015-12-27 ENCOUNTER — Encounter (HOSPITAL_COMMUNITY): Payer: Self-pay

## 2015-12-28 ENCOUNTER — Encounter (HOSPITAL_COMMUNITY)
Admission: RE | Admit: 2015-12-28 | Discharge: 2015-12-28 | Disposition: A | Discharge status: Home / Self Care | Payer: Self-pay | Source: Ambulatory Visit | Attending: Internal Medicine | Admitting: Internal Medicine

## 2015-12-28 DIAGNOSIS — Z5189 Encounter for other specified aftercare: Secondary | ICD-10-CM | POA: Insufficient documentation

## 2015-12-28 DIAGNOSIS — I10 Essential (primary) hypertension: Secondary | ICD-10-CM | POA: Insufficient documentation

## 2015-12-28 DIAGNOSIS — I5033 Acute on chronic diastolic (congestive) heart failure: Secondary | ICD-10-CM | POA: Insufficient documentation

## 2015-12-29 ENCOUNTER — Encounter (HOSPITAL_COMMUNITY): Payer: Self-pay

## 2015-12-30 ENCOUNTER — Encounter (HOSPITAL_COMMUNITY)
Admission: RE | Admit: 2015-12-30 | Discharge: 2015-12-30 | Disposition: A | Discharge status: Home / Self Care | Payer: Self-pay | Source: Ambulatory Visit | Attending: Internal Medicine | Admitting: Internal Medicine

## 2015-12-31 ENCOUNTER — Encounter (HOSPITAL_COMMUNITY): Payer: Self-pay

## 2016-01-03 ENCOUNTER — Encounter (HOSPITAL_COMMUNITY): Payer: Self-pay

## 2016-01-04 ENCOUNTER — Encounter (HOSPITAL_COMMUNITY)
Admission: RE | Admit: 2016-01-04 | Discharge: 2016-01-04 | Disposition: A | Discharge status: Home / Self Care | Payer: Self-pay | Source: Ambulatory Visit | Attending: Internal Medicine | Admitting: Internal Medicine

## 2016-01-05 ENCOUNTER — Encounter (HOSPITAL_COMMUNITY): Payer: Self-pay

## 2016-01-06 ENCOUNTER — Encounter (HOSPITAL_COMMUNITY): Admission: RE | Admit: 2016-01-06 | Payer: Self-pay | Source: Ambulatory Visit

## 2016-01-07 ENCOUNTER — Encounter (HOSPITAL_COMMUNITY): Payer: Self-pay

## 2016-01-10 ENCOUNTER — Encounter (HOSPITAL_COMMUNITY): Payer: Self-pay

## 2016-01-11 ENCOUNTER — Encounter (HOSPITAL_COMMUNITY)
Admission: RE | Admit: 2016-01-11 | Discharge: 2016-01-11 | Disposition: A | Discharge status: Home / Self Care | Payer: Self-pay | Source: Ambulatory Visit | Attending: Internal Medicine | Admitting: Internal Medicine

## 2016-01-12 ENCOUNTER — Encounter (HOSPITAL_COMMUNITY): Payer: Self-pay

## 2016-01-13 ENCOUNTER — Encounter (HOSPITAL_COMMUNITY)
Admission: RE | Admit: 2016-01-13 | Discharge: 2016-01-13 | Disposition: A | Discharge status: Home / Self Care | Payer: Self-pay | Source: Ambulatory Visit | Attending: Internal Medicine | Admitting: Internal Medicine

## 2016-01-18 ENCOUNTER — Encounter (HOSPITAL_COMMUNITY): Payer: Self-pay

## 2016-01-20 ENCOUNTER — Encounter (HOSPITAL_COMMUNITY)
Admission: RE | Admit: 2016-01-20 | Discharge: 2016-01-20 | Disposition: A | Discharge status: Home / Self Care | Payer: Self-pay | Source: Ambulatory Visit | Attending: Internal Medicine | Admitting: Internal Medicine

## 2016-01-25 ENCOUNTER — Encounter (HOSPITAL_COMMUNITY)
Admission: RE | Admit: 2016-01-25 | Discharge: 2016-01-25 | Disposition: A | Discharge status: Home / Self Care | Payer: Self-pay | Source: Ambulatory Visit | Attending: Internal Medicine | Admitting: Internal Medicine

## 2016-01-25 DIAGNOSIS — Z5189 Encounter for other specified aftercare: Secondary | ICD-10-CM | POA: Insufficient documentation

## 2016-01-25 DIAGNOSIS — I5033 Acute on chronic diastolic (congestive) heart failure: Secondary | ICD-10-CM | POA: Insufficient documentation

## 2016-01-25 DIAGNOSIS — I10 Essential (primary) hypertension: Secondary | ICD-10-CM | POA: Insufficient documentation

## 2016-01-27 ENCOUNTER — Encounter (HOSPITAL_COMMUNITY)
Admission: RE | Admit: 2016-01-27 | Discharge: 2016-01-27 | Disposition: A | Discharge status: Home / Self Care | Payer: Self-pay | Source: Ambulatory Visit | Attending: Internal Medicine | Admitting: Internal Medicine

## 2016-02-01 ENCOUNTER — Encounter (HOSPITAL_COMMUNITY)
Admission: RE | Admit: 2016-02-01 | Discharge: 2016-02-01 | Disposition: A | Discharge status: Home / Self Care | Payer: Self-pay | Source: Ambulatory Visit | Attending: Internal Medicine | Admitting: Internal Medicine

## 2016-02-03 ENCOUNTER — Encounter (HOSPITAL_COMMUNITY)
Admission: RE | Admit: 2016-02-03 | Discharge: 2016-02-03 | Disposition: A | Discharge status: Home / Self Care | Payer: Self-pay | Source: Ambulatory Visit | Attending: Internal Medicine | Admitting: Internal Medicine

## 2016-02-08 ENCOUNTER — Encounter (HOSPITAL_COMMUNITY)
Admission: RE | Admit: 2016-02-08 | Discharge: 2016-02-08 | Disposition: A | Discharge status: Home / Self Care | Payer: Self-pay | Source: Ambulatory Visit | Attending: Internal Medicine | Admitting: Internal Medicine

## 2016-02-10 ENCOUNTER — Encounter (HOSPITAL_COMMUNITY): Payer: Self-pay

## 2016-02-15 ENCOUNTER — Encounter (HOSPITAL_COMMUNITY)
Admission: RE | Admit: 2016-02-15 | Discharge: 2016-02-15 | Disposition: A | Discharge status: Home / Self Care | Payer: Self-pay | Source: Ambulatory Visit | Attending: Internal Medicine | Admitting: Internal Medicine

## 2016-02-17 ENCOUNTER — Encounter (HOSPITAL_COMMUNITY)
Admission: RE | Admit: 2016-02-17 | Discharge: 2016-02-17 | Disposition: A | Discharge status: Home / Self Care | Payer: Self-pay | Source: Ambulatory Visit | Attending: Internal Medicine | Admitting: Internal Medicine

## 2016-02-22 ENCOUNTER — Encounter (HOSPITAL_COMMUNITY)
Admission: RE | Admit: 2016-02-22 | Discharge: 2016-02-22 | Disposition: A | Discharge status: Home / Self Care | Payer: Self-pay | Source: Ambulatory Visit | Attending: Internal Medicine | Admitting: Internal Medicine

## 2016-02-24 ENCOUNTER — Encounter (HOSPITAL_COMMUNITY): Payer: Self-pay

## 2016-02-24 DIAGNOSIS — I5033 Acute on chronic diastolic (congestive) heart failure: Secondary | ICD-10-CM | POA: Insufficient documentation

## 2016-02-24 DIAGNOSIS — I10 Essential (primary) hypertension: Secondary | ICD-10-CM | POA: Insufficient documentation

## 2016-02-24 DIAGNOSIS — Z5189 Encounter for other specified aftercare: Secondary | ICD-10-CM | POA: Insufficient documentation

## 2016-02-29 ENCOUNTER — Encounter (HOSPITAL_COMMUNITY)
Admission: RE | Admit: 2016-02-29 | Discharge: 2016-02-29 | Disposition: A | Discharge status: Home / Self Care | Payer: Self-pay | Source: Ambulatory Visit | Attending: Internal Medicine | Admitting: Internal Medicine

## 2016-03-02 ENCOUNTER — Encounter (HOSPITAL_COMMUNITY)
Admission: RE | Admit: 2016-03-02 | Discharge: 2016-03-02 | Disposition: A | Discharge status: Home / Self Care | Payer: Self-pay | Source: Ambulatory Visit | Attending: Internal Medicine | Admitting: Internal Medicine

## 2016-03-07 ENCOUNTER — Encounter (HOSPITAL_COMMUNITY)
Admission: RE | Admit: 2016-03-07 | Discharge: 2016-03-07 | Disposition: A | Discharge status: Home / Self Care | Payer: Self-pay | Source: Ambulatory Visit | Attending: Internal Medicine | Admitting: Internal Medicine

## 2016-03-09 ENCOUNTER — Encounter (HOSPITAL_COMMUNITY): Payer: Self-pay

## 2016-03-14 ENCOUNTER — Encounter (HOSPITAL_COMMUNITY): Payer: Self-pay

## 2016-03-16 ENCOUNTER — Encounter (HOSPITAL_COMMUNITY): Payer: Self-pay

## 2016-03-21 ENCOUNTER — Encounter (HOSPITAL_COMMUNITY)
Admission: RE | Admit: 2016-03-21 | Discharge: 2016-03-21 | Disposition: A | Discharge status: Home / Self Care | Payer: Self-pay | Source: Ambulatory Visit | Attending: Internal Medicine | Admitting: Internal Medicine

## 2016-03-23 ENCOUNTER — Encounter (HOSPITAL_COMMUNITY)
Admission: RE | Admit: 2016-03-23 | Discharge: 2016-03-23 | Disposition: A | Discharge status: Home / Self Care | Payer: Self-pay | Source: Ambulatory Visit | Attending: Internal Medicine | Admitting: Internal Medicine

## 2016-03-28 ENCOUNTER — Encounter (HOSPITAL_COMMUNITY)
Admission: RE | Admit: 2016-03-28 | Discharge: 2016-03-28 | Disposition: A | Discharge status: Home / Self Care | Payer: Self-pay | Source: Ambulatory Visit | Attending: Internal Medicine | Admitting: Internal Medicine

## 2016-03-28 DIAGNOSIS — Z5189 Encounter for other specified aftercare: Secondary | ICD-10-CM | POA: Insufficient documentation

## 2016-03-28 DIAGNOSIS — I5033 Acute on chronic diastolic (congestive) heart failure: Secondary | ICD-10-CM | POA: Insufficient documentation

## 2016-03-28 DIAGNOSIS — I10 Essential (primary) hypertension: Secondary | ICD-10-CM | POA: Insufficient documentation

## 2016-03-30 ENCOUNTER — Encounter (HOSPITAL_COMMUNITY): Payer: Self-pay

## 2016-04-04 ENCOUNTER — Encounter (HOSPITAL_COMMUNITY)
Admission: RE | Admit: 2016-04-04 | Discharge: 2016-04-04 | Disposition: A | Discharge status: Home / Self Care | Payer: Self-pay | Source: Ambulatory Visit | Attending: Internal Medicine | Admitting: Internal Medicine

## 2016-04-06 ENCOUNTER — Encounter (HOSPITAL_COMMUNITY)
Admission: RE | Admit: 2016-04-06 | Discharge: 2016-04-06 | Disposition: A | Discharge status: Home / Self Care | Payer: Self-pay | Source: Ambulatory Visit | Attending: Internal Medicine | Admitting: Internal Medicine

## 2016-04-11 ENCOUNTER — Encounter (HOSPITAL_COMMUNITY)
Admission: RE | Admit: 2016-04-11 | Discharge: 2016-04-11 | Disposition: A | Discharge status: Home / Self Care | Payer: Self-pay | Source: Ambulatory Visit | Attending: Internal Medicine | Admitting: Internal Medicine

## 2016-04-13 ENCOUNTER — Encounter (HOSPITAL_COMMUNITY): Payer: Self-pay

## 2016-04-18 ENCOUNTER — Encounter (HOSPITAL_COMMUNITY): Payer: Self-pay

## 2016-04-20 ENCOUNTER — Encounter (HOSPITAL_COMMUNITY)
Admission: RE | Admit: 2016-04-20 | Discharge: 2016-04-20 | Disposition: A | Discharge status: Home / Self Care | Payer: Self-pay | Source: Ambulatory Visit | Attending: Internal Medicine | Admitting: Internal Medicine

## 2016-04-25 ENCOUNTER — Encounter (HOSPITAL_COMMUNITY): Payer: Self-pay

## 2016-04-25 DIAGNOSIS — I10 Essential (primary) hypertension: Secondary | ICD-10-CM | POA: Insufficient documentation

## 2016-04-25 DIAGNOSIS — Z5189 Encounter for other specified aftercare: Secondary | ICD-10-CM | POA: Insufficient documentation

## 2016-04-25 DIAGNOSIS — I5033 Acute on chronic diastolic (congestive) heart failure: Secondary | ICD-10-CM | POA: Insufficient documentation

## 2016-04-27 ENCOUNTER — Encounter (HOSPITAL_COMMUNITY): Payer: Self-pay

## 2016-05-02 ENCOUNTER — Encounter (HOSPITAL_COMMUNITY): Payer: Self-pay

## 2016-05-04 ENCOUNTER — Encounter (HOSPITAL_COMMUNITY): Payer: Self-pay

## 2016-05-09 ENCOUNTER — Encounter (HOSPITAL_COMMUNITY): Payer: Self-pay

## 2016-05-11 ENCOUNTER — Encounter (HOSPITAL_COMMUNITY): Payer: Self-pay

## 2016-05-16 ENCOUNTER — Encounter (HOSPITAL_COMMUNITY): Payer: Self-pay

## 2016-05-18 ENCOUNTER — Encounter (HOSPITAL_COMMUNITY): Payer: Self-pay

## 2016-05-23 ENCOUNTER — Encounter (HOSPITAL_COMMUNITY)
Admission: RE | Admit: 2016-05-23 | Discharge: 2016-05-23 | Disposition: A | Discharge status: Home / Self Care | Payer: Self-pay | Source: Ambulatory Visit | Attending: Internal Medicine | Admitting: Internal Medicine

## 2016-05-25 ENCOUNTER — Encounter (HOSPITAL_COMMUNITY)
Admission: RE | Admit: 2016-05-25 | Discharge: 2016-05-25 | Disposition: A | Discharge status: Home / Self Care | Payer: Medicare Other | Source: Ambulatory Visit | Attending: Internal Medicine | Admitting: Internal Medicine

## 2016-05-25 DIAGNOSIS — Z5189 Encounter for other specified aftercare: Secondary | ICD-10-CM | POA: Insufficient documentation

## 2016-05-25 DIAGNOSIS — I5033 Acute on chronic diastolic (congestive) heart failure: Secondary | ICD-10-CM | POA: Insufficient documentation

## 2016-05-25 DIAGNOSIS — I10 Essential (primary) hypertension: Secondary | ICD-10-CM | POA: Insufficient documentation

## 2016-05-30 ENCOUNTER — Encounter (HOSPITAL_COMMUNITY): Payer: Medicare Other

## 2016-06-01 ENCOUNTER — Encounter (HOSPITAL_COMMUNITY)
Admission: RE | Admit: 2016-06-01 | Discharge: 2016-06-01 | Disposition: A | Discharge status: Home / Self Care | Payer: Medicare Other | Source: Ambulatory Visit | Attending: Internal Medicine | Admitting: Internal Medicine

## 2016-06-06 ENCOUNTER — Encounter (HOSPITAL_COMMUNITY)
Admission: RE | Admit: 2016-06-06 | Discharge: 2016-06-06 | Disposition: A | Discharge status: Home / Self Care | Payer: Medicare Other | Source: Ambulatory Visit | Attending: Internal Medicine | Admitting: Internal Medicine

## 2016-06-08 ENCOUNTER — Encounter (HOSPITAL_COMMUNITY)
Admission: RE | Admit: 2016-06-08 | Discharge: 2016-06-08 | Disposition: A | Discharge status: Home / Self Care | Payer: Medicare Other | Source: Ambulatory Visit | Attending: Internal Medicine | Admitting: Internal Medicine

## 2016-06-13 ENCOUNTER — Encounter (HOSPITAL_COMMUNITY)
Admission: RE | Admit: 2016-06-13 | Discharge: 2016-06-13 | Disposition: A | Discharge status: Home / Self Care | Payer: Medicare Other | Source: Ambulatory Visit | Attending: Internal Medicine | Admitting: Internal Medicine

## 2016-06-15 ENCOUNTER — Encounter (HOSPITAL_COMMUNITY): Payer: Medicare Other

## 2016-06-20 ENCOUNTER — Encounter (HOSPITAL_COMMUNITY): Payer: Medicare Other

## 2016-06-22 ENCOUNTER — Encounter (HOSPITAL_COMMUNITY)
Admission: RE | Admit: 2016-06-22 | Discharge: 2016-06-22 | Disposition: A | Discharge status: Home / Self Care | Payer: Self-pay | Source: Ambulatory Visit | Attending: Internal Medicine | Admitting: Internal Medicine

## 2016-06-22 DIAGNOSIS — I10 Essential (primary) hypertension: Secondary | ICD-10-CM | POA: Insufficient documentation

## 2016-06-22 DIAGNOSIS — Z5189 Encounter for other specified aftercare: Secondary | ICD-10-CM | POA: Insufficient documentation

## 2016-06-22 DIAGNOSIS — I5033 Acute on chronic diastolic (congestive) heart failure: Secondary | ICD-10-CM | POA: Insufficient documentation

## 2016-06-27 ENCOUNTER — Encounter (HOSPITAL_COMMUNITY)
Admission: RE | Admit: 2016-06-27 | Discharge: 2016-06-27 | Disposition: A | Discharge status: Home / Self Care | Payer: Self-pay | Source: Ambulatory Visit | Attending: Internal Medicine | Admitting: Internal Medicine

## 2016-06-29 ENCOUNTER — Encounter (HOSPITAL_COMMUNITY)
Admission: RE | Admit: 2016-06-29 | Discharge: 2016-06-29 | Disposition: A | Discharge status: Home / Self Care | Payer: Self-pay | Source: Ambulatory Visit | Attending: Internal Medicine | Admitting: Internal Medicine

## 2016-07-04 ENCOUNTER — Encounter (HOSPITAL_COMMUNITY): Payer: Self-pay

## 2016-07-06 ENCOUNTER — Encounter (HOSPITAL_COMMUNITY)
Admission: RE | Admit: 2016-07-06 | Discharge: 2016-07-06 | Disposition: A | Discharge status: Home / Self Care | Payer: Self-pay | Source: Ambulatory Visit | Attending: Internal Medicine | Admitting: Internal Medicine

## 2016-07-11 ENCOUNTER — Encounter (HOSPITAL_COMMUNITY): Payer: Self-pay

## 2016-07-13 ENCOUNTER — Encounter (HOSPITAL_COMMUNITY)
Admission: RE | Admit: 2016-07-13 | Discharge: 2016-07-13 | Disposition: A | Discharge status: Home / Self Care | Payer: Self-pay | Source: Ambulatory Visit | Attending: Internal Medicine | Admitting: Internal Medicine

## 2016-07-18 ENCOUNTER — Encounter (HOSPITAL_COMMUNITY): Payer: Self-pay

## 2016-07-20 ENCOUNTER — Encounter (HOSPITAL_COMMUNITY)
Admission: RE | Admit: 2016-07-20 | Discharge: 2016-07-20 | Disposition: A | Discharge status: Home / Self Care | Payer: Self-pay | Source: Ambulatory Visit | Attending: Internal Medicine | Admitting: Internal Medicine

## 2016-07-21 ENCOUNTER — Encounter (HOSPITAL_COMMUNITY): Payer: Self-pay | Admitting: Emergency Medicine

## 2016-07-21 ENCOUNTER — Emergency Department (HOSPITAL_COMMUNITY): Payer: Medicare Other

## 2016-07-21 ENCOUNTER — Observation Stay (HOSPITAL_COMMUNITY)
Admission: EM | Admit: 2016-07-21 | Discharge: 2016-07-22 | Disposition: A | Discharge status: Home / Self Care | Payer: Medicare Other | Attending: Internal Medicine | Admitting: Internal Medicine

## 2016-07-21 ENCOUNTER — Inpatient Hospital Stay (HOSPITAL_COMMUNITY): Payer: Medicare Other

## 2016-07-21 DIAGNOSIS — Z833 Family history of diabetes mellitus: Secondary | ICD-10-CM | POA: Diagnosis not present

## 2016-07-21 DIAGNOSIS — K5730 Diverticulosis of large intestine without perforation or abscess without bleeding: Secondary | ICD-10-CM | POA: Insufficient documentation

## 2016-07-21 DIAGNOSIS — Z9049 Acquired absence of other specified parts of digestive tract: Secondary | ICD-10-CM | POA: Insufficient documentation

## 2016-07-21 DIAGNOSIS — Z87891 Personal history of nicotine dependence: Secondary | ICD-10-CM | POA: Diagnosis not present

## 2016-07-21 DIAGNOSIS — E876 Hypokalemia: Secondary | ICD-10-CM | POA: Insufficient documentation

## 2016-07-21 DIAGNOSIS — Z8249 Family history of ischemic heart disease and other diseases of the circulatory system: Secondary | ICD-10-CM | POA: Insufficient documentation

## 2016-07-21 DIAGNOSIS — I7300 Raynaud's syndrome without gangrene: Secondary | ICD-10-CM | POA: Insufficient documentation

## 2016-07-21 DIAGNOSIS — M1990 Unspecified osteoarthritis, unspecified site: Secondary | ICD-10-CM | POA: Diagnosis not present

## 2016-07-21 DIAGNOSIS — D638 Anemia in other chronic diseases classified elsewhere: Secondary | ICD-10-CM | POA: Insufficient documentation

## 2016-07-21 DIAGNOSIS — Z882 Allergy status to sulfonamides status: Secondary | ICD-10-CM | POA: Insufficient documentation

## 2016-07-21 DIAGNOSIS — K648 Other hemorrhoids: Secondary | ICD-10-CM | POA: Insufficient documentation

## 2016-07-21 DIAGNOSIS — E039 Hypothyroidism, unspecified: Secondary | ICD-10-CM | POA: Diagnosis not present

## 2016-07-21 DIAGNOSIS — I272 Pulmonary hypertension, unspecified: Secondary | ICD-10-CM | POA: Insufficient documentation

## 2016-07-21 DIAGNOSIS — J9621 Acute and chronic respiratory failure with hypoxia: Secondary | ICD-10-CM | POA: Insufficient documentation

## 2016-07-21 DIAGNOSIS — N184 Chronic kidney disease, stage 4 (severe): Secondary | ICD-10-CM | POA: Diagnosis not present

## 2016-07-21 DIAGNOSIS — Z79899 Other long term (current) drug therapy: Secondary | ICD-10-CM | POA: Insufficient documentation

## 2016-07-21 DIAGNOSIS — Z888 Allergy status to other drugs, medicaments and biological substances status: Secondary | ICD-10-CM | POA: Insufficient documentation

## 2016-07-21 DIAGNOSIS — I130 Hypertensive heart and chronic kidney disease with heart failure and stage 1 through stage 4 chronic kidney disease, or unspecified chronic kidney disease: Secondary | ICD-10-CM | POA: Insufficient documentation

## 2016-07-21 DIAGNOSIS — Z823 Family history of stroke: Secondary | ICD-10-CM | POA: Insufficient documentation

## 2016-07-21 DIAGNOSIS — R918 Other nonspecific abnormal finding of lung field: Secondary | ICD-10-CM | POA: Insufficient documentation

## 2016-07-21 DIAGNOSIS — I5032 Chronic diastolic (congestive) heart failure: Secondary | ICD-10-CM | POA: Diagnosis not present

## 2016-07-21 DIAGNOSIS — E785 Hyperlipidemia, unspecified: Secondary | ICD-10-CM | POA: Diagnosis not present

## 2016-07-21 DIAGNOSIS — Z86010 Personal history of colonic polyps: Secondary | ICD-10-CM | POA: Insufficient documentation

## 2016-07-21 DIAGNOSIS — K219 Gastro-esophageal reflux disease without esophagitis: Secondary | ICD-10-CM | POA: Insufficient documentation

## 2016-07-21 DIAGNOSIS — K921 Melena: Secondary | ICD-10-CM | POA: Insufficient documentation

## 2016-07-21 DIAGNOSIS — R0600 Dyspnea, unspecified: Secondary | ICD-10-CM | POA: Diagnosis present

## 2016-07-21 DIAGNOSIS — M349 Systemic sclerosis, unspecified: Secondary | ICD-10-CM | POA: Diagnosis not present

## 2016-07-21 DIAGNOSIS — R1314 Dysphagia, pharyngoesophageal phase: Secondary | ICD-10-CM | POA: Insufficient documentation

## 2016-07-21 DIAGNOSIS — E673 Hypervitaminosis D: Secondary | ICD-10-CM | POA: Insufficient documentation

## 2016-07-21 DIAGNOSIS — A419 Sepsis, unspecified organism: Secondary | ICD-10-CM | POA: Diagnosis not present

## 2016-07-21 DIAGNOSIS — J189 Pneumonia, unspecified organism: Secondary | ICD-10-CM | POA: Insufficient documentation

## 2016-07-21 DIAGNOSIS — I10 Essential (primary) hypertension: Secondary | ICD-10-CM | POA: Diagnosis present

## 2016-07-21 DIAGNOSIS — K5900 Constipation, unspecified: Secondary | ICD-10-CM | POA: Insufficient documentation

## 2016-07-21 DIAGNOSIS — M810 Age-related osteoporosis without current pathological fracture: Secondary | ICD-10-CM | POA: Diagnosis not present

## 2016-07-21 DIAGNOSIS — R590 Localized enlarged lymph nodes: Secondary | ICD-10-CM | POA: Insufficient documentation

## 2016-07-21 DIAGNOSIS — Z885 Allergy status to narcotic agent status: Secondary | ICD-10-CM | POA: Diagnosis not present

## 2016-07-21 LAB — BASIC METABOLIC PANEL
Anion gap: 11 (ref 5–15)
BUN: 26 mg/dL — AB (ref 6–20)
CO2: 19 mmol/L — ABNORMAL LOW (ref 22–32)
Calcium: 8.6 mg/dL — ABNORMAL LOW (ref 8.9–10.3)
Chloride: 110 mmol/L (ref 101–111)
Creatinine, Ser: 1.42 mg/dL — ABNORMAL HIGH (ref 0.44–1.00)
GFR calc Af Amer: 40 mL/min — ABNORMAL LOW (ref >60)
GFR, EST NON AFRICAN AMERICAN: 34 mL/min — AB (ref >60)
GLUCOSE: 100 mg/dL — AB (ref 65–99)
Potassium: 4.1 mmol/L (ref 3.5–5.1)
Sodium: 140 mmol/L (ref 135–145)

## 2016-07-21 LAB — PROTIME-INR
INR: 1.08
PROTHROMBIN TIME: 14 s (ref 11.4–15.2)

## 2016-07-21 LAB — CBC
HCT: 30.7 % — ABNORMAL LOW (ref 36.0–46.0)
Hemoglobin: 10.4 g/dL — ABNORMAL LOW (ref 12.0–15.0)
MCH: 32.5 pg (ref 26.0–34.0)
MCHC: 33.9 g/dL (ref 30.0–36.0)
MCV: 95.9 fL (ref 78.0–100.0)
Platelets: 377 10*3/uL (ref 150–400)
RBC: 3.2 MIL/uL — ABNORMAL LOW (ref 3.87–5.11)
RDW: 15.2 % (ref 11.5–15.5)
WBC: 13.5 10*3/uL — ABNORMAL HIGH (ref 4.0–10.5)

## 2016-07-21 LAB — LACTIC ACID, PLASMA: LACTIC ACID, VENOUS: 3.9 mmol/L — AB (ref 0.5–1.9)

## 2016-07-21 LAB — I-STAT TROPONIN, ED: Troponin i, poc: 0.01 ng/mL (ref 0.00–0.08)

## 2016-07-21 LAB — I-STAT VENOUS BLOOD GAS, ED
Acid-base deficit: 4 mmol/L — ABNORMAL HIGH (ref 0.0–2.0)
BICARBONATE: 20.1 mmol/L (ref 20.0–28.0)
O2 Saturation: 36 %
PH VEN: 7.412 (ref 7.250–7.430)
PO2 VEN: 21 mmHg — AB (ref 32.0–45.0)
TCO2: 21 mmol/L (ref 0–100)
pCO2, Ven: 31.6 mmHg — ABNORMAL LOW (ref 44.0–60.0)

## 2016-07-21 LAB — PROCALCITONIN: PROCALCITONIN: 0.36 ng/mL

## 2016-07-21 LAB — INFLUENZA PANEL BY PCR (TYPE A & B)
INFLBPCR: NEGATIVE
Influenza A By PCR: NEGATIVE

## 2016-07-21 LAB — APTT: aPTT: 35 seconds (ref 24–36)

## 2016-07-21 LAB — BRAIN NATRIURETIC PEPTIDE: B Natriuretic Peptide: 416.2 pg/mL — ABNORMAL HIGH (ref 0.0–100.0)

## 2016-07-21 MED ORDER — LOPERAMIDE HCL 2 MG PO CAPS: 2.0000 mg | ORAL_CAPSULE | Freq: Four times a day (QID) | ORAL | Status: DC | PRN

## 2016-07-21 MED ORDER — LEVOTHYROXINE SODIUM 50 MCG PO TABS
50.0000 ug | ORAL_TABLET | Freq: Every morning | ORAL | Status: DC
  Filled 2016-07-21: qty 1

## 2016-07-21 MED ORDER — METHYLPREDNISOLONE SODIUM SUCC 125 MG IJ SOLR
60.0000 mg | Freq: Two times a day (BID) | INTRAMUSCULAR | Status: DC
  Administered 2016-07-21 – 2016-07-22 (×2): 60 mg via INTRAVENOUS
  Filled 2016-07-21 (×2): qty 2

## 2016-07-21 MED ORDER — ALBUTEROL SULFATE (2.5 MG/3ML) 0.083% IN NEBU
5.0000 mg | INHALATION_SOLUTION | Freq: Once | RESPIRATORY_TRACT | Status: AC
  Administered 2016-07-21: 5 mg via RESPIRATORY_TRACT
  Filled 2016-07-21: qty 6

## 2016-07-21 MED ORDER — TORSEMIDE 20 MG PO TABS: 10.0000 mg | ORAL_TABLET | ORAL | Status: DC

## 2016-07-21 MED ORDER — SODIUM CHLORIDE 0.9 % IV SOLN
INTRAVENOUS | Status: DC
  Administered 2016-07-21: 23:00:00 via INTRAVENOUS

## 2016-07-21 MED ORDER — ACETAMINOPHEN 500 MG PO TABS: 1000.0000 mg | ORAL_TABLET | Freq: Every morning | ORAL | Status: DC

## 2016-07-21 MED ORDER — OFLOXACIN 0.3 % OP SOLN
1.0000 [drp] | Freq: Four times a day (QID) | OPHTHALMIC | Status: DC
Start: 2016-07-21 — End: 2016-07-22
  Administered 2016-07-21: 1 [drp] via OPHTHALMIC
  Filled 2016-07-21: qty 5

## 2016-07-21 MED ORDER — PREDNISOLONE ACETATE 1 % OP SUSP
1.0000 [drp] | Freq: Four times a day (QID) | OPHTHALMIC | Status: DC
  Administered 2016-07-21: 1 [drp] via OPHTHALMIC
  Filled 2016-07-21: qty 1

## 2016-07-21 MED ORDER — IPRATROPIUM-ALBUTEROL 0.5-2.5 (3) MG/3ML IN SOLN
3.0000 mL | Freq: Two times a day (BID) | RESPIRATORY_TRACT | Status: DC
  Filled 2016-07-21: qty 3

## 2016-07-21 MED ORDER — MELATONIN 5 MG PO TABS
2.0000 | ORAL_TABLET | Freq: Two times a day (BID) | ORAL | Status: DC | PRN
  Filled 2016-07-21: qty 2

## 2016-07-21 MED ORDER — ALBUTEROL SULFATE (2.5 MG/3ML) 0.083% IN NEBU: 5.0000 mg | INHALATION_SOLUTION | RESPIRATORY_TRACT | Status: DC | PRN

## 2016-07-21 MED ORDER — FOLIC ACID 400 MCG PO TABS: 400.0000 ug | ORAL_TABLET | Freq: Every evening | ORAL | Status: DC

## 2016-07-21 MED ORDER — BRIMONIDINE TARTRATE 0.2 % OP SOLN
1.0000 [drp] | Freq: Two times a day (BID) | OPHTHALMIC | Status: DC
  Administered 2016-07-21: 1 [drp] via OPHTHALMIC
  Filled 2016-07-21: qty 5

## 2016-07-21 MED ORDER — ALBUTEROL SULFATE (2.5 MG/3ML) 0.083% IN NEBU
2.5000 mg | INHALATION_SOLUTION | RESPIRATORY_TRACT | Status: DC
  Administered 2016-07-21: 2.5 mg via RESPIRATORY_TRACT
  Filled 2016-07-21: qty 3

## 2016-07-21 MED ORDER — FOLIC ACID 1 MG PO TABS: 0.5000 mg | ORAL_TABLET | Freq: Every evening | ORAL | Status: DC

## 2016-07-21 MED ORDER — KETOROLAC TROMETHAMINE 0.5 % OP SOLN
1.0000 [drp] | Freq: Four times a day (QID) | OPHTHALMIC | Status: DC
  Administered 2016-07-21: 1 [drp] via OPHTHALMIC
  Filled 2016-07-21: qty 3

## 2016-07-21 MED ORDER — LEVOFLOXACIN 500 MG PO TABS
500.0000 mg | ORAL_TABLET | Freq: Once | ORAL | Status: AC
  Administered 2016-07-21: 500 mg via ORAL
  Filled 2016-07-21: qty 1

## 2016-07-21 MED ORDER — VITAMIN D 1000 UNITS PO TABS: 1000.0000 [IU] | ORAL_TABLET | Freq: Every morning | ORAL | Status: DC

## 2016-07-21 MED ORDER — DM-GUAIFENESIN ER 30-600 MG PO TB12: 1.0000 | ORAL_TABLET | Freq: Two times a day (BID) | ORAL | Status: DC | PRN

## 2016-07-21 MED ORDER — IPRATROPIUM BROMIDE 0.02 % IN SOLN: 0.5000 mg | RESPIRATORY_TRACT | Status: DC | PRN

## 2016-07-21 MED ORDER — ALLOPURINOL 100 MG PO TABS
100.0000 mg | ORAL_TABLET | ORAL | Status: DC
  Filled 2016-07-21: qty 1

## 2016-07-21 MED ORDER — SIMVASTATIN 20 MG PO TABS
20.0000 mg | ORAL_TABLET | Freq: Every evening | ORAL | Status: DC
  Administered 2016-07-21: 20 mg via ORAL
  Filled 2016-07-21: qty 1

## 2016-07-21 MED ORDER — LEVOFLOXACIN IN D5W 500 MG/100ML IV SOLN: 500.0000 mg | INTRAVENOUS | Status: DC

## 2016-07-21 MED ORDER — MELATONIN 3 MG PO TABS
9.0000 mg | ORAL_TABLET | Freq: Two times a day (BID) | ORAL | Status: DC | PRN
  Filled 2016-07-21: qty 3

## 2016-07-21 MED ORDER — AMBRISENTAN 5 MG PO TABS: 10.0000 mg | ORAL_TABLET | Freq: Every morning | ORAL | Status: DC

## 2016-07-21 MED ORDER — SODIUM CHLORIDE 0.9 % IV BOLUS (SEPSIS)
500.0000 mL | Freq: Once | INTRAVENOUS | Status: AC
  Administered 2016-07-21: 500 mL via INTRAVENOUS

## 2016-07-21 MED ORDER — METHYLPREDNISOLONE SODIUM SUCC 125 MG IJ SOLR
125.0000 mg | Freq: Once | INTRAMUSCULAR | Status: AC
  Administered 2016-07-21: 125 mg via INTRAVENOUS
  Filled 2016-07-21: qty 2

## 2016-07-21 MED ORDER — VITAMIN D 1000 UNITS PO TABS: 2000.0000 [IU] | ORAL_TABLET | Freq: Every day | ORAL | Status: DC

## 2016-07-21 MED ORDER — AMLODIPINE BESYLATE 5 MG PO TABS
5.0000 mg | ORAL_TABLET | ORAL | Status: DC
  Filled 2016-07-21: qty 1

## 2016-07-21 MED ORDER — FUROSEMIDE 10 MG/ML IJ SOLN
40.0000 mg | Freq: Once | INTRAMUSCULAR | Status: AC
  Administered 2016-07-21: 40 mg via INTRAVENOUS
  Filled 2016-07-21: qty 4

## 2016-07-21 MED ORDER — PANTOPRAZOLE SODIUM 40 MG PO TBEC: 40.0000 mg | DELAYED_RELEASE_TABLET | Freq: Every day | ORAL | Status: DC

## 2016-07-21 MED ORDER — IPRATROPIUM BROMIDE 0.02 % IN SOLN
2.5000 mL | RESPIRATORY_TRACT | Status: DC
  Administered 2016-07-21: 0.5 mg via RESPIRATORY_TRACT
  Filled 2016-07-21: qty 2.5

## 2016-07-21 NOTE — ED Triage Notes (Signed)
Pt reports increased sob that began last night, regularly on 4L O2 with activity and 2L O2 at night, currently on 3L from home, O2 sat 100%. Pt states regular MD sent her for r/u CHF. Pt a/ox4, speaking in full sentences.

## 2016-07-21 NOTE — Progress Notes (Signed)
CRITICAL VALUE ALERT  Critical value received:  Lactic acid 3.9  Date of notification:  3/302018  Time of notification:  2058  Critical value read back: Yess  Nurse who received alert:  Hermina Barters  Informed Primary nurse Parke Simmers of critical value.

## 2016-07-21 NOTE — ED Notes (Signed)
Delay in lab draw pt using bedside commode

## 2016-07-21 NOTE — H&P (Signed)
History and Physical    VIVIANNA Wise IPJ:[id number] DOB: [birth year] DOA: 07/21/2016  Referring MD/NP/PA: Dr. Jeanell Sparrow   PCP: Osborne Casco, MD   Patient coming from: home   Chief Complaint: dyspnea   HPI: Meghan Wise is a 81 y.o. female with known scleroderma, pulmonary hypertension, ESRD, CHF, presented to [facility] ED with main concern of several days duration of progressively worsening dyspnea, at rest and with exertion, associated with nasal congestion, non productive cough, fatigue, low grade fevers up to 99.5 F. She reports feeling at her baseline prior to this and is typically on oxygen 3 L [place]. Pt denies specific sick contact or exposures, no recent changes in medications. Pt also denies any changes in weight, no lower extremity swelling, no orthopnea.   ED Course: Pt initially noted to be dyspneic, has received dose of Lasix IV, Solumedrol IV and has responded well, asking to be discharged home. VS currently stable but RR initially up to 23 bpm. Blood work notable for WBC 13.5, Hg 10.4, Cr 1.41. CXR with no evidence of PNA or congestion. TRH asked to admit for further management.   Review of Systems:  Constitutional: Negative for chills, diaphoresis, activity change, appetite change  HENT: Negative for rhinorrhea, neck pain, neck stiffness and ear discharge.   Eyes: Negative for pain, discharge, redness, itching and visual disturbance.  Respiratory: Negative for wheezing and stridor.   Cardiovascular: Negative for chest pain, palpitations and leg swelling.  Gastrointestinal: Negative for abdominal distention.  Genitourinary: Negative for dysuria, urgency, frequency, hematuria, flank pain, decreased urine volume Musculoskeletal: Negative for back pain, joint swelling, arthralgias and gait problem.  Neurological: Negative for dizziness, tremors, seizures, syncope, facial asymmetry, speech difficulty, weakness, light-headedness, numbness and headaches.  Hematological: Negative  for adenopathy. Does not bruise/bleed easily.  Psychiatric/Behavioral: Negative for hallucinations, behavioral problems, confusion, dysphoric mood  Past Medical History:  Diagnosis Date  . Heart failure ([place])   . HTN (hypertension)   . Hyperlipidemia   . Hypothyroidism   . Kidney disease    Stage IV- Dr. Servando Salina -LOV 02-11-14  . Osteoarthritis   . Osteoporosis   . Pulmonary hypertension   . Raynaud disease    reactive with cold and stress mostly fingers.  . Scleroderma ([place])    lungs and kidneys  . Shortness of breath dyspnea    with exertion-in Cardiac rehab program at [facility].    Past Surgical History:  Procedure Laterality Date  . BREAST BIOPSY     x 5  . CARDIAC CATHETERIZATION     2'15 [facility]  . CHOLECYSTECTOMY    . CHOLECYSTECTOMY, LAPAROSCOPIC    . COLONOSCOPY WITH PROPOFOL N/A 05/07/2014   Procedure: COLONOSCOPY WITH PROPOFOL;  Surgeon: Inda Castle, MD;  Location: [facility] ENDOSCOPY;  Service: Endoscopy;  Laterality: N/A;  . ESOPHAGOGASTRODUODENOSCOPY (EGD) WITH PROPOFOL N/A 05/07/2014   Procedure: ESOPHAGOGASTRODUODENOSCOPY (EGD) WITH PROPOFOL;  Surgeon: Inda Castle, MD;  Location: [facility] ENDOSCOPY;  Service: Endoscopy;  Laterality: N/A;  . HOT HEMOSTASIS N/A 05/07/2014   Procedure: HOT HEMOSTASIS (ARGON PLASMA COAGULATION/BICAP);  Surgeon: Inda Castle, MD;  Location: [facility];  Service: Endoscopy;  Laterality: N/A;  deuodenal bulb  . SHOULDER ARTHROSCOPY W/ ROTATOR CUFF REPAIR Right    Social Hx:  reports that she quit smoking about 29 years ago. Her smoking use included Cigarettes. She has a 90.00 pack-year smoking history. She has never used smokeless tobacco. She reports that she drinks alcohol. She reports that she does not use drugs.  Allergies  Allergen Reactions  . Losartan Other (See Comments) and Cough    High calcium count and renal problems  . Ace Inhibitors Other (See Comments) and Cough    Dizziness and coughing   . Sulfa Antibiotics Hives  .  Codeine Nausea Only    Family History  Problem Relation Age of Onset  . Heart disease Mother   . Heart disease Father   . Cancer      husband--esophageal  . Diabetes Son   . Stroke Daughter     Prior to Admission medications   Medication Sig Start Date End Date Taking? Authorizing Provider  acetaminophen (TYLENOL) 500 MG tablet Take 1,000 mg by mouth every morning.   Yes Historical Provider, MD  allopurinol (ZYLOPRIM) 100 MG tablet Take 100 mg by mouth every morning. 02/10/16  Yes Historical Provider, MD  ambrisentan (LETAIRIS) 10 MG tablet Take 10 mg by mouth every morning.    Yes Historical Provider, MD  amLODipine (NORVASC) 5 MG tablet Take 5 mg by mouth every morning. 03/15/16  Yes Historical Provider, MD  brimonidine (ALPHAGAN) 0.2 % ophthalmic solution Place 1 drop into both eyes 2 (two) times daily. 07/11/16  Yes Historical Provider, MD  cholecalciferol (VITAMIN D) 1000 UNITS tablet Take 1,000 Units by mouth every morning.    Yes Historical Provider, MD  Cholecalciferol (VITAMIN D3) 2000 units capsule Take 2,000 Units by mouth daily.   Yes Historical Provider, MD  dextromethorphan-guaiFENesin (MUCINEX DM) 30-600 MG 12hr tablet Take 1 tablet by mouth 2 (two) times daily as needed for cough.   Yes Historical Provider, MD  folic acid (FOLVITE) 161 MCG tablet Take 400 mcg by mouth every evening.    Yes Historical Provider, MD  ketorolac (ACULAR) 0.5 % ophthalmic solution Place 1 drop into the left eye 4 (four) times daily. 07/04/16  Yes Historical Provider, MD  levothyroxine (SYNTHROID, LEVOTHROID) 50 MCG tablet Take 50 mcg by mouth every morning.   Yes Historical Provider, MD  loperamide (IMODIUM A-D) 2 MG tablet Take 2 mg by mouth 4 (four) times daily as needed for diarrhea or loose stools.   Yes Historical Provider, MD  Melatonin 10 MG TABS Take 1 tablet by mouth 3 times/day as needed-between meals & bedtime (sleep).    Yes Historical Provider, MD  ofloxacin (OCUFLOX) 0.3 % ophthalmic  solution Place 1 drop into the left eye 4 (four) times daily. 07/04/16  Yes Historical Provider, MD  omeprazole (PRILOSEC) 20 MG capsule Take 20 mg by mouth daily.   Yes Historical Provider, MD  prednisoLONE acetate (PRED FORTE) 1 % ophthalmic suspension Place 1 drop into the left eye 4 (four) times daily. 07/04/16  Yes Historical Provider, MD  simvastatin (ZOCOR) 20 MG tablet Take 20 mg by mouth every evening.   Yes Historical Provider, MD  Tadalafil, PAH, (ADCIRCA) 20 MG TABS Take 40 mg by mouth every evening.  08/27/13  Yes Historical Provider, MD  torsemide (DEMADEX) 20 MG tablet Take 10 mg by mouth See admin instructions. Takes mon and fri only   Yes Historical Provider, MD    Physical Exam: Vitals:   07/21/16 1430 07/21/16 1545 07/21/16 1600 07/21/16 1615  BP: (!) 155/54 (!) 163/66 (!) 151/88 (!) 162/66  Pulse: 71   77  Resp: 20   (!) 22  Temp: 98 F (36.7 C)     TempSrc: Oral     SpO2: 99%   98%  Weight:      Height:  Constitutional: NAD, calm, comfortable Vitals:   07/21/16 1430 07/21/16 1545 07/21/16 1600 07/21/16 1615  BP: (!) 155/54 (!) 163/66 (!) 151/88 (!) 162/66  Pulse: 71   77  Resp: 20   (!) 22  Temp: 98 F (36.7 C)     TempSrc: Oral     SpO2: 99%   98%  Weight:      Height:       Eyes: PERRL, lids and conjunctivae normal ENMT: Mucous membranes are moist. Posterior pharynx clear of any exudate or lesions.Normal dentition.  Neck: normal, supple, no masses, no thyromegaly Respiratory:  Normal respiratory effort. No accessory muscle use. Diminished breath sounds at bases  Cardiovascular: Regular rate and rhythm, no murmurs / rubs / gallops. No extremity edema. 2+ pedal pulses. No carotid bruits.  Abdomen: no tenderness, no masses palpated. No hepatosplenomegaly. Bowel sounds positive.  Musculoskeletal: no clubbing / cyanosis. No joint deformity upper and lower extremities. Good ROM, no contractures.  Skin: no rashes, lesions, ulcers. No induration Neurologic:  CN 2-12 grossly intact. Sensation intact, DTR normal. Strength 5/5 in all 4.  Psychiatric: Normal judgment and insight. Alert and oriented x 3. Normal mood.   Labs on Admission: I have personally reviewed following labs and imaging studies  CBC:  Recent Labs Lab 07/21/16 1229  WBC 13.5*  HGB 10.4*  HCT 30.7*  MCV 95.9  PLT 841   Basic Metabolic Panel:  Recent Labs Lab 07/21/16 1229  NA 140  K 4.1  CL 110  CO2 19*  GLUCOSE 100*  BUN 26*  CREATININE 1.42*  CALCIUM 8.6*   Urine analysis:    Component Value Date/Time   COLORURINE YELLOW 05/24/2012 1450   APPEARANCEUR CLEAR 05/24/2012 1450   LABSPEC 1.013 05/24/2012 1450   PHURINE 6.5 05/24/2012 1450   GLUCOSEU NEGATIVE 05/24/2012 1450   HGBUR NEGATIVE 05/24/2012 1450   BILIRUBINUR NEGATIVE 05/24/2012 1450   KETONESUR NEGATIVE 05/24/2012 1450   PROTEINUR NEGATIVE 05/24/2012 1450   UROBILINOGEN 0.2 05/24/2012 1450   NITRITE NEGATIVE 05/24/2012 1450   LEUKOCYTESUR SMALL (A) 05/24/2012 1450   Radiological Exams on Admission: Dg Chest 2 View  Result Date: 07/21/2016 CLINICAL DATA:  Shortness of breath, cough, congestion EXAM: CHEST  2 VIEW COMPARISON:  Chest CT 05/04/2014 FINDINGS: Areas of scarring in the upper lobes bilaterally. Heart is borderline in size. No confluent acute opacities or effusions. No acute bony abnormality. IMPRESSION: Chronic scarring in the upper lobes bilaterally.  No active disease. Electronically Signed   By: Rolm Baptise M.D.   On: 07/21/2016 13:05   EKG: pending   Assessment/Plan   Principal Problem:   Acute on chronic respiratory failure with hypoxia ([place]) / Sepsis /Community acquired pneumonia - please note that pt met criteria for sepsis and suspect related to CAP  - PNA and sepsis order set in place - pt placed on Levaquin  - will need to follow up on blood and sputum cultures, urine legionella and strep pneumo  - monitor oxygen saturations - pt has also responded to solumedrol, so  I will continue lower dose 60 mg IV BID with plan to narrow down to Prednisone in 24 hours  - pt is stable for telemetry admission   Active Problems:   CKD (chronic kidney disease) stage 4, GFR 15-29 ml/min (HCC) - Baseline Cr 2.3 about 4 years ago - Cr on admission within baseline range  - will repeat BMP in AM    HTN (hypertension), essential - reasonably stable  Hypothyroidism - continue home medical regimen     Chronic diastolic CHF (congestive heart failure), NYHA class 4 (HCC) - pt has gotten dose of lasix 40 mg IV - no signs of volume overload - resume pt's home regimen Torsemide     Anemia of chronic disease - monitor for now - no signs of active bleeding     Raynaud disease - outpatient follow up  DVT prophylaxis: Lovenox SQ Code Status: Full  Family Communication: Pt updated at bedside Disposition Plan: Pt will go home when medically stable  Consults called: None Admission status: Inpatient   Faye Ramsay MD [facility] Pager [phone]  If 7PM-7AM, please contact night-coverage [email or web address] Password [email or web address]  07/21/2016, 5:59 PM

## 2016-07-21 NOTE — ED Provider Notes (Signed)
[facility] DEPT Provider Note   CSN: [id number] Arrival date & time: 07/21/16  1223     History   Chief Complaint Chief Complaint  Patient presents with  . Shortness of Breath    HPI Meghan Wise is a 81 y.o. female.   HPI 81 year old female history of scleroderma, pulmonary hypertension, end-stage renal disease, CHF presents today with increasing dyspnea over several days. She states that on Monday she began feeling like she had sinus symptoms with some nasal discharge and pressure in her face. Since that time, she began having some nonproductive coughing. She states that she had fever last night to 99.5. She has not noted chills, nausea, vomiting, or diarrhea. She did have a flu shot this year. She has not had known sick contacts. She is on oxygen at home and barely sit but is normally on 2 L at night. Last night she became very dyspneic thank you and oxygen. She states she is very dyspneic with any exertion. She went to be seen today at urgent care and they thought that she may have congestive heart failure and sent her into the ED for further evaluation. She has not had any peripheral swelling. Her symptoms are worse at night. Past Medical History:  Diagnosis Date  . Heart failure ([place])   . HTN (hypertension)   . Hyperlipidemia   . Hypothyroidism   . Kidney disease    Stage IV- Dr. Servando Salina -LOV 02-11-14  . Osteoarthritis   . Osteoporosis   . Pulmonary hypertension   . Raynaud disease    reactive with cold and stress mostly fingers.  . Scleroderma ([place])    lungs and kidneys  . Shortness of breath dyspnea    with exertion-in Cardiac rehab program at [facility].    Patient Active Problem List   Diagnosis Date Noted  . Benign neoplasm of colon 05/07/2014  . Diverticulosis of colon without hemorrhage 05/07/2014  . Internal bleeding hemorrhoids 05/07/2014  . AVM (arteriovenous malformation) of duodenum, acquired 05/07/2014  . Fecal incontinence 04/01/2014  .  Dysphagia, pharyngoesophageal phase 04/01/2014  . Rectal bleeding 04/01/2014  . GERD (gastroesophageal reflux disease) 04/01/2014  . Anemia 04/01/2014  . Pulmonary nodules 08/26/2012  . Cough 08/12/2012  . Dyspnea on exertion 07/08/2012  . Acute on chronic diastolic CHF (congestive heart failure), NYHA class 4 ([place]) 05/28/2012  . Hypervitaminosis D 05/28/2012  . Hypokalemia 05/27/2012  . Hypomagnesemia 05/27/2012  . Mediastinal lymphadenopathy 05/25/2012  . Constipation 05/25/2012  . Dehydration 05/24/2012  . ARF (acute renal failure) ([place]) 05/24/2012  . Hypercalcemia 05/24/2012  . CKD (chronic kidney disease) stage 2, GFR 60-89 ml/min 05/24/2012  . Osteoporosis   . HTN (hypertension)   . Osteoarthritis   . Raynaud disease   . Hypothyroidism     Past Surgical History:  Procedure Laterality Date  . BREAST BIOPSY     x 5  . CARDIAC CATHETERIZATION     2'15 [facility]  . CHOLECYSTECTOMY    . CHOLECYSTECTOMY, LAPAROSCOPIC    . COLONOSCOPY WITH PROPOFOL N/A 05/07/2014   Procedure: COLONOSCOPY WITH PROPOFOL;  Surgeon: Inda Castle, MD;  Location: [facility] ENDOSCOPY;  Service: Endoscopy;  Laterality: N/A;  . ESOPHAGOGASTRODUODENOSCOPY (EGD) WITH PROPOFOL N/A 05/07/2014   Procedure: ESOPHAGOGASTRODUODENOSCOPY (EGD) WITH PROPOFOL;  Surgeon: Inda Castle, MD;  Location: [facility];  Service: Endoscopy;  Laterality: N/A;  . HOT HEMOSTASIS N/A 05/07/2014   Procedure: HOT HEMOSTASIS (ARGON PLASMA COAGULATION/BICAP);  Surgeon: Inda Castle, MD;  Location: [facility] ENDOSCOPY;  Service: Endoscopy;  Laterality: N/A;  deuodenal bulb  . SHOULDER ARTHROSCOPY W/ ROTATOR CUFF REPAIR Right     OB History    No data available       Home Medications    Prior to Admission medications   Medication Sig Start Date End Date Taking? Authorizing Provider  acetaminophen (TYLENOL) 500 MG tablet Take 1,000 mg by mouth every morning.   Yes Historical Provider, MD  allopurinol (ZYLOPRIM) 100 MG tablet Take 100  mg by mouth every morning. 02/10/16  Yes Historical Provider, MD  ambrisentan (LETAIRIS) 10 MG tablet Take 10 mg by mouth every morning.    Yes Historical Provider, MD  amLODipine (NORVASC) 5 MG tablet Take 5 mg by mouth every morning. 03/15/16  Yes Historical Provider, MD  brimonidine (ALPHAGAN) 0.2 % ophthalmic solution Place 1 drop into both eyes 2 (two) times daily. 07/11/16  Yes Historical Provider, MD  cholecalciferol (VITAMIN D) 1000 UNITS tablet Take 1,000 Units by mouth every morning.    Yes Historical Provider, MD  Cholecalciferol (VITAMIN D3) 2000 units capsule Take 2,000 Units by mouth daily.   Yes Historical Provider, MD  dextromethorphan-guaiFENesin (MUCINEX DM) 30-600 MG 12hr tablet Take 1 tablet by mouth 2 (two) times daily as needed for cough.   Yes Historical Provider, MD  folic acid (FOLVITE) 269 MCG tablet Take 400 mcg by mouth every evening.    Yes Historical Provider, MD  ketorolac (ACULAR) 0.5 % ophthalmic solution Place 1 drop into the left eye 4 (four) times daily. 07/04/16  Yes Historical Provider, MD  levothyroxine (SYNTHROID, LEVOTHROID) 50 MCG tablet Take 50 mcg by mouth every morning.   Yes Historical Provider, MD  loperamide (IMODIUM A-D) 2 MG tablet Take 2 mg by mouth 4 (four) times daily as needed for diarrhea or loose stools.   Yes Historical Provider, MD  Melatonin 10 MG TABS Take 1 tablet by mouth 3 times/day as needed-between meals & bedtime (sleep).    Yes Historical Provider, MD  ofloxacin (OCUFLOX) 0.3 % ophthalmic solution Place 1 drop into the left eye 4 (four) times daily. 07/04/16  Yes Historical Provider, MD  omeprazole (PRILOSEC) 20 MG capsule Take 20 mg by mouth daily.   Yes Historical Provider, MD  prednisoLONE acetate (PRED FORTE) 1 % ophthalmic suspension Place 1 drop into the left eye 4 (four) times daily. 07/04/16  Yes Historical Provider, MD  simvastatin (ZOCOR) 20 MG tablet Take 20 mg by mouth every evening.   Yes Historical Provider, MD  Tadalafil,  PAH, (ADCIRCA) 20 MG TABS Take 40 mg by mouth every evening.  08/27/13  Yes Historical Provider, MD  torsemide (DEMADEX) 20 MG tablet Take 10 mg by mouth See admin instructions. Takes mon and fri only   Yes Historical Provider, MD    Family History Family History  Problem Relation Age of Onset  . Heart disease Mother   . Heart disease Father   . Cancer      husband--esophageal  . Diabetes Son   . Stroke Daughter     Social History Social History  Substance Use Topics  . Smoking status: Former Smoker    Packs/day: 1.50    Years: 60.00    Types: Cigarettes    Quit date: 04/25/1987  . Smokeless tobacco: Never Used  . Alcohol use 0.0 oz/week     Comment: OCCASIONALLY- rare     Allergies   Losartan; Ace inhibitors; Sulfa antibiotics; and Codeine   Review of Systems Review of Systems  All other systems reviewed and are negative.    Physical Exam Updated Vital Signs BP (!) 155/54 (BP Location: Right Arm)   Pulse 71   Temp 98 F (36.7 C) (Oral)   Resp 20   Ht _0  (1.473 m)   Wt 47.2 kg   SpO2 99%   BMI 21.74 kg/m   Physical Exam  Constitutional: She is oriented to person, place, and time. She appears well-developed and well-nourished.  HENT:  Head: Normocephalic and atraumatic.  Lips and mucous membranes appear dry  Eyes: Conjunctivae and EOM are normal. Pupils are equal, round, and reactive to light.  Neck: Normal range of motion. Neck supple.  Cardiovascular: Normal rate, regular rhythm and normal heart sounds.   Pulmonary/Chest: Effort normal and breath sounds normal. No respiratory distress. She has no wheezes.  Abdominal: Soft. Bowel sounds are normal.  Musculoskeletal: Normal range of motion.  Neurological: She is alert and oriented to person, place, and time.  Skin: Skin is warm and dry. Capillary refill takes less than 2 seconds.  Psychiatric: She has a normal mood and affect. Her behavior is normal. Thought content normal.  Nursing note and vitals  reviewed.    ED Treatments / Results  Labs (all labs ordered are listed, but only abnormal results are displayed) Labs Reviewed  BASIC METABOLIC PANEL - Abnormal; Notable for the following:       Result Value   CO2 19 (*)    Glucose, Bld 100 (*)    BUN 26 (*)    Creatinine, Ser 1.42 (*)    Calcium 8.6 (*)    GFR calc non Af Amer 34 (*)    GFR calc Af Amer 40 (*)    All other components within normal limits  CBC - Abnormal; Notable for the following:    WBC 13.5 (*)    RBC 3.20 (*)    Hemoglobin 10.4 (*)    HCT 30.7 (*)    All other components within normal limits  BRAIN NATRIURETIC PEPTIDE  BLOOD GAS, VENOUS  I-STAT TROPOININ, ED    EKG  EKG Interpretation  Date/Time:  Friday July 21 2016 12:27:12 EDT Ventricular Rate:  69 PR Interval:  138 QRS Duration: 82 QT Interval:  448 QTC Calculation: 480 R Axis:   68 Text Interpretation:  Normal sinus rhythm Normal ECG No significant change since last tracing Confirmed by Thuy Atilano MD, Andee Poles [phone]) on 07/21/2016 3:23:39 PM       Radiology Dg Chest 2 View  Result Date: 07/21/2016 CLINICAL DATA:  Shortness of breath, cough, congestion EXAM: CHEST  2 VIEW COMPARISON:  Chest CT 05/04/2014 FINDINGS: Areas of scarring in the upper lobes bilaterally. Heart is borderline in size. No confluent acute opacities or effusions. No acute bony abnormality. IMPRESSION: Chronic scarring in the upper lobes bilaterally.  No active disease. Electronically Signed   By: Rolm Baptise M.D.   On: 07/21/2016 13:05    Procedures Procedures (including critical care time)  Medications Ordered in ED Medications  sodium chloride 0.9 % bolus 500 mL (not administered)  methylPREDNISolone sodium succinate (SOLU-MEDROL) 125 mg/2 mL injection 125 mg (not administered)  albuterol (PROVENTIL) (2.5 MG/3ML) 0.083% nebulizer solution 5 mg (not administered)  levofloxacin (LEVAQUIN) tablet 500 mg (not administered)     Initial Impression / Assessment and  Plan / ED Course  I have reviewed the triage vital signs and the nursing notes.  Pertinent labs & imaging results that were available during my care of the patient  were reviewed by me and considered in my medical decision making (see chart for details).     This is a 81 year old female with scleroderma, CHF, pulmonary hypertension who presents today with URI symptoms and increasing dyspnea. Requiring increased oxygen and still remains dyspneic at home. VBG reveals decreased PCO2 31 pH 7.41.  Patient with severe dyspnea with change in position, exertion and orthopneic- likely multifactorial with some volume overload (elevated bnp) and infection (no pneumonia seen on cxr and flu pending.)  Patient treated with levaquin clinically with s/s sinusitis and now with increasing lower respiratory symptoms.  Discussed with Dr. Tana Coast and will see and admit Final Clinical Impressions(s) / ED Diagnoses   Final diagnoses:  Acute on chronic respiratory failure with hypoxia [facility])    New Prescriptions New Prescriptions   No medications on file     Pattricia Boss, MD 07/21/16 1728

## 2016-07-21 NOTE — Progress Notes (Signed)
Just notified Dr Meghan Wise of critical Lactic Acid of 3.9. Patient in bed resting. Will continue to monitor patient.

## 2016-07-22 ENCOUNTER — Encounter (HOSPITAL_COMMUNITY): Payer: Self-pay | Admitting: *Deleted

## 2016-07-22 DIAGNOSIS — J9621 Acute and chronic respiratory failure with hypoxia: Secondary | ICD-10-CM | POA: Diagnosis not present

## 2016-07-22 DIAGNOSIS — J189 Pneumonia, unspecified organism: Secondary | ICD-10-CM | POA: Diagnosis present

## 2016-07-22 LAB — RESPIRATORY PANEL BY PCR
Adenovirus: NOT DETECTED
BORDETELLA PERTUSSIS-RVPCR: NOT DETECTED
CORONAVIRUS OC43-RVPPCR: NOT DETECTED
Chlamydophila pneumoniae: NOT DETECTED
Coronavirus 229E: NOT DETECTED
Coronavirus HKU1: NOT DETECTED
Coronavirus NL63: NOT DETECTED
INFLUENZA A-RVPPCR: NOT DETECTED
INFLUENZA B-RVPPCR: NOT DETECTED
Metapneumovirus: NOT DETECTED
Mycoplasma pneumoniae: NOT DETECTED
PARAINFLUENZA VIRUS 1-RVPPCR: NOT DETECTED
PARAINFLUENZA VIRUS 4-RVPPCR: NOT DETECTED
Parainfluenza Virus 2: NOT DETECTED
Parainfluenza Virus 3: NOT DETECTED
RESPIRATORY SYNCYTIAL VIRUS-RVPPCR: NOT DETECTED
Rhinovirus / Enterovirus: NOT DETECTED

## 2016-07-22 LAB — CBC
HCT: 30.4 % — ABNORMAL LOW (ref 36.0–46.0)
Hemoglobin: 10.1 g/dL — ABNORMAL LOW (ref 12.0–15.0)
MCH: 31.3 pg (ref 26.0–34.0)
MCHC: 33.2 g/dL (ref 30.0–36.0)
MCV: 94.1 fL (ref 78.0–100.0)
Platelets: 326 10*3/uL (ref 150–400)
RBC: 3.23 MIL/uL — ABNORMAL LOW (ref 3.87–5.11)
RDW: 14.7 % (ref 11.5–15.5)
WBC: 8.7 10*3/uL (ref 4.0–10.5)

## 2016-07-22 LAB — BASIC METABOLIC PANEL
ANION GAP: 12 (ref 5–15)
BUN: 29 mg/dL — ABNORMAL HIGH (ref 6–20)
CALCIUM: 8.1 mg/dL — AB (ref 8.9–10.3)
CO2: 19 mmol/L — AB (ref 22–32)
CREATININE: 1.47 mg/dL — AB (ref 0.44–1.00)
Chloride: 110 mmol/L (ref 101–111)
GFR calc Af Amer: 38 mL/min — ABNORMAL LOW (ref >60)
GFR calc non Af Amer: 33 mL/min — ABNORMAL LOW (ref >60)
GLUCOSE: 113 mg/dL — AB (ref 65–99)
Potassium: 4.2 mmol/L (ref 3.5–5.1)
Sodium: 141 mmol/L (ref 135–145)

## 2016-07-22 LAB — HIV ANTIBODY (ROUTINE TESTING W REFLEX): HIV Screen 4th Generation wRfx: NONREACTIVE

## 2016-07-22 MED ORDER — LEVOFLOXACIN 500 MG PO TABS: 500.0000 mg | ORAL_TABLET | Freq: Every day | ORAL | 0 refills | Status: DC

## 2016-07-22 MED ORDER — PREDNISONE 10 MG PO TABS: ORAL_TABLET | ORAL | 0 refills | Status: DC

## 2016-07-22 MED ORDER — AZITHROMYCIN 500 MG PO TABS: 250.0000 mg | ORAL_TABLET | Freq: Every day | ORAL | 0 refills | Status: DC

## 2016-07-22 NOTE — Discharge Instructions (Signed)
Chronic Respiratory Failure Respiratory failure is a condition in which the lungs do not work well and the breathing (respiratory) system fails. When respiratory failure occurs, it becomes difficult for the lungs to get enough oxygen or to eliminate carbon dioxide or to do both duties. If the lungs do not work properly, the heart, brain, and other body systems do not get enough oxygen. Respiratory failure is life-threatening if it is not treated. Respiratory failure can be acute or chronic. Acute respiratory failure is sudden and severe and requires emergency medical treatment. Chronic respiratory failure happens over time, usually due to a medical condition that gets worse. What are the causes? This condition may be caused by any problem that affects the heart or lungs. Causes include:  Chronic bronchitis and emphysema (COPD).  Pulmonary fibrosis.  Water in the lungs due to heart failure, lung injury, or infection (pulmonary edema).  Asthma.  Nerve or muscle diseases that make chest movements difficult, such as Leta Baptist disease or Guillain-Barre syndrome.  A collapsed lung (pneumothorax).  Pulmonary hypertension.  Chronic sleep apnea.  Pneumonia.  Obesity.  A blood clot in a lung (pulmonary embolism).  Trauma to the chest that makes breathing difficult. What increases the risk? You are more likely to develop this condition if:  You are a smoker, or have a history of smoking.  You have a weak immune system.  You have a family history of breathing problems or lung disease.  You have a long term lung disease such as COPD. What are the signs or symptoms? Symptoms of this condition include:  Shortness of breath with or without activity.  Difficulty breathing.  Wheezing.  A fast or irregular heartbeat (arrhythmia).  Chest pain or tightness.  A bluish color to the fingernail or toenail beds (cyanosis).  Confusion.  Drowsiness.  Extreme fatigue, especially with  minimal activity. How is this diagnosed? This condition may be diagnosed based on:  Your medical history.  A physical exam.  Other tests, such as:  A chest X-ray.  A CT scan of your lungs.  Blood tests, such as an arterial blood gas test. This test is done to check if you have enough oxygen in your blood.  An electrocardiogram. This test records the electrical activity of your heart.  An echocardiogram. This test uses sound waves to produce an image of your heart.  A check of your blood pressure, heart rate, breathing rate, and blood oxygen level. How is this treated? Treatment for this condition depends on the cause. Treatment can include the following:  Getting oxygen through a nasal cannula. This is a tube that goes in your nose.  Getting oxygen through a face mask.  Receiving noninvasive positive pressure ventilation. This is a method of breathing support in which a machine blows air into your lungs through a mask. The machine allows you to breathe on your own. It helps the body take in oxygen and eliminate carbon dioxide.  Using a ventilator. This is a breathing machine that delivers oxygen to the lungs through a breathing tube that is put into the trachea. This machine is used when you can no longer breathe well enough on your own.  Medicines to help with breathing, such as:  Medicines that open up and relax air passages, such as bronchodilators. These may be given through a device that turns liquid medicines into a mist you can breathe in (nebulizer). These medicines help with breathing.  Diuretics. These medicines get rid of extra fluid out of  your lungs, which can help you breathe better.  Steroid medicines. These decrease inflammation in the lungs.  Antibiotic medicines. These may be given to treat a bacterial infection, such as pneumonia.  Pulmonary rehabilitation. This is an exercise program that strengthens the muscles in your chest and helps you learn breathing  techniques in order to manage your condition. Follow these instructions at home: Medicines   Take over-the-counter and prescription medicines only as told by your health care provider.  If you were prescribed an antibiotic medicine, take it as told by your health care provider. Do not stop taking the antibiotic even if you start to feel better. General instructions   Use oxygen therapy and pulmonary rehabilitation if directed to by your health care provider. If you require home oxygen therapy, ask your health care provider whether you should purchase a pulse oximeter to measure your oxygen level at home.  Work with your health care provider to create a plan to help you deal with your condition. Follow this plan.  Do not use any products that contain nicotine or tobacco, such as cigarettes and e-cigarettes. If you need help quitting, ask your health care provider.  Avoid exposure to irritants that make your breathing problems worse. These include smoke, chemicals, and fumes.  Stay active, but balance activity with periods of rest. Exercise and physical activity will help you maintain your ability to do things you want to do.  Stay up to date on all vaccines, especially yearly influenza and pneumonia vaccines.  Avoid people who are sick as well as crowded places during the flu season.  Keep all follow-up visits as told by your health care provider. This is important. Contact a health care provider if:  Your shortness of breath gets worse and you cannot do the things you used to do.  You have increased mucus (sputum), wheezing, coughing, or loss of energy.  You are on oxygen therapy and you are starting to need more.  You need to use your medicines more often.  You have a fever. Get help right away if:  Your shortness of breath becomes worse.  You are unable to say more than a few words without having to catch your breath.  You develop chest pain or  tightness. Summary  Respiratory failure is a condition in which the lungs do not work well and the breathing system fails.  This condition can be very serious and is often life-threatening.  This condition is diagnosed with tests and can be treated with medicines or oxygen.  Contact a health care provider if your shortness of breath gets worse or if you need to use your oxygen or medicines more often than before. This information is not intended to replace advice given to you by your health care provider. Make sure you discuss any questions you have with your health care provider. Document Released: 04/10/2005 Document Revised: 04/21/2016 Document Reviewed: 04/21/2016 Elsevier Interactive Patient Education  2017 [employer].

## 2016-07-22 NOTE — Discharge Summary (Addendum)
Physician Discharge Summary  Meghan Wise IOE:[id number] DOB: June 03, 1935 DOA: 07/21/2016  PCP: Osborne Casco, MD  Admit date: 07/21/2016 Discharge date: 07/22/2016  Recommendations for Outpatient Follow-up:  1. Pt will need to follow up with PCP in 2-3 weeks post discharge 2. Please obtain BMP to evaluate electrolytes and kidney function 3. Please also check CBC to evaluate Hg and Hct levels 4. Pt advised to complete therapy with ABX and prednisone taper as outlined below 5. Pt also advised to follow up with his pulmonologist at [facility], this summary will be forwarded to pt's pulmonologist Dr. Daine Gravel to review CT chest below and provide further recommendations, pt made aware of CT chest findings and prefers to discuss with her pulmonologist  6. Please note that pt insisted on going home this AM  Discharge Diagnoses:  Principal Problem:   Acute on chronic respiratory failure with hypoxia ([place]) Active Problems:   Community acquired pneumonia  Discharge Condition: Stable  Diet recommendation: Heart healthy diet discussed in details   History of present illness:  81 y.o. female with known scleroderma, pulmonary hypertension, ESRD, CHF, presented to [facility] ED with main concern of several days duration of progressively worsening dyspnea, at rest and with exertion, associated with nasal congestion, non productive cough, fatigue, low grade fevers up to 99.5 F. She reports feeling at her baseline prior to this and is typically on oxygen 3 L [place]. Pt denies specific sick contact or exposures, no recent changes in medications. Pt also denies any changes in weight, no lower extremity swelling, no orthopnea.   ED Course: Pt initially noted to be dyspneic, has received dose of Lasix IV, Solumedrol IV and has responded well, asking to be discharged home. VS currently stable but RR initially up to 23 bpm. Blood work notable for WBC 13.5, Hg 10.4, Cr 1.41. CXR with no evidence of PNA or  congestion. TRH asked to admit for further management.   Hospital Course:  Principal Problem:   Acute on chronic respiratory failure with hypoxia ([place]) / Sepsis /Community acquired pneumonia - please note that pt met criteria for sepsis and suspect related to CAP  - pt was discharge on Zithromax upon discharge to complete therapy  - will need to follow up on blood and sputum cultures, urine legionella and strep pneumo as this was still pending on discharge but pt insisted on leaving ASAP this AM  - pt also discharge on prednisone taper  - please note CT chest results which could be due to pt's known scleroderma but atypical infection also possible so pt started on ABX as noted above   CT chest results - detailed report is below under imaging studies  Masslike areas of consolidation noted at both apices, larger on the right, new when compared to the prior CT.  Interstitial thickening and irregular peribronchovascular nodules and areas of ground-glass opacity are noted with an upper lobe Mediastinal adenopathy which has slightly progressed from the prior CT. Neoplastic disease, however, is not excluded. Short-term followup with repeat chest CT in 2-3 months is recommended.   Active Problems:   CKD (chronic kidney disease) stage 4, GFR 15-29 ml/min (HCC), not on HD - Baseline Cr 2.3 about 4 years ago - Cr on admission within baseline range  - no further blood work as pt wants to be discharge STAT     HTN (hypertension), essential - reasonably stable     Hypothyroidism - continue home medical regimen     Chronic diastolic CHF (congestive heart  failure), NYHA class 4 (HCC) - pt has gotten dose of lasix 40 mg IV - no signs of volume overload - resume pt's home regimen Torsemide     Anemia of chronic disease - monitor for now - no signs of active bleeding     Raynaud disease - outpatient follow up  DVT prophylaxis: Lovenox SQ Code Status: Full  Family Communication: Pt  updated at bedside Disposition Plan: Pt will go home when medically stable  Consults called: None Admission status: Inpatient   Faye Ramsay MD [facility] Pager [phone]  If 7PM-7AM, please contact night-coverage [email or web address] Password [email or web address]  07/21/2016, 5:59 PM   Procedures/Studies: Dg Chest 2 View Result Date: 07/21/2016 Chronic scarring in the upper lobes bilaterally.  No active disease.   Ct Chest Wo Contrast Result Date: 07/21/2016 Masslike areas of consolidation noted at both apices, larger on the right, new when compared to the prior CT. Interstitial thickening and irregular peribronchovascular nodules and areas of ground-glass opacity are noted with an upper lobe predominance. This has also increased when compared the prior CT. There is mediastinal adenopathy which has slightly progressed from the prior CT. In the medical history, patient reportedly has scleroderma which may be the source of the long abnormalities. Infection should be included, particularly atypical infection, in the proper clinical setting. 2. There are lung nodules, the largest of which is pleural-based in the posterior left lower lobe, new from the prior CT. It measures an average of 11 mm. This may be part of the generalized inflammatory lung disease. Neoplastic disease, however, is not excluded. Short-term followup with repeat chest CT in 2-3 months is recommended.   Discharge Exam: Vitals:   07/21/16 2017 07/22/16 0009  BP: (!) 154/65 (!) 116/58  Pulse: 92 80  Resp: (!) 22 20  Temp: 97.8 F (36.6 C)    Vitals:   07/21/16 1900 07/21/16 2017 07/21/16 2057 07/22/16 0009  BP: 128/72 (!) 154/65  (!) 116/58  Pulse: 98 92  80  Resp:  (!) 22  20  Temp:  97.8 F (36.6 C)    TempSrc:  Oral    SpO2: 98% 94% 98% 98%  Weight:  47.3 kg (104 lb 4.8 oz)    Height:  _0  (1.473 m)      General: Pt is alert, follows commands appropriately, not in acute distress Cardiovascular: Regular  rate and rhythm, S1/S2 +, no murmurs, no rubs, no gallops Respiratory: rhonchi at bases with mild exp wheezing  Abdominal: Soft, non tender, non distended, bowel sounds +, no guarding Extremities: no edema, no cyanosis, pulses palpable bilaterally DP and PT   Discharge Instructions  Discharge Instructions    Diet - low sodium heart healthy    Complete by:  As directed    Increase activity slowly    Complete by:  As directed      Allergies as of 07/22/2016      Reactions   Losartan Other (See Comments), Cough   High calcium count and renal problems   Ace Inhibitors Other (See Comments), Cough   Dizziness and coughing   Sulfa Antibiotics Hives   Codeine Nausea Only      Medication List    TAKE these medications   acetaminophen 500 MG tablet Commonly known as:  TYLENOL Take 1,000 mg by mouth every morning.   ADCIRCA 20 MG tablet Generic drug:  tadalafil (PAH) Take 40 mg by mouth every evening.   allopurinol 100 MG tablet Commonly known as:  ZYLOPRIM Take 100 mg by mouth every morning.   ambrisentan 10 MG tablet Commonly known as:  LETAIRIS Take 10 mg by mouth every morning.   amLODipine 5 MG tablet Commonly known as:  NORVASC Take 5 mg by mouth every morning.   azithromycin 500 MG tablet Commonly known as:  ZITHROMAX Take 0.5 tablets (250 mg total) by mouth daily.   brimonidine 0.2 % ophthalmic solution Commonly known as:  ALPHAGAN Place 1 drop into both eyes 2 (two) times daily.   Vitamin D3 2000 units capsule Take 2,000 Units by mouth daily.   cholecalciferol 1000 units tablet Commonly known as:  VITAMIN D Take 1,000 Units by mouth every morning.   dextromethorphan-guaiFENesin 30-600 MG 12hr tablet Commonly known as:  MUCINEX DM Take 1 tablet by mouth 2 (two) times daily as needed for cough.   folic acid 606 MCG tablet Commonly known as:  FOLVITE Take 400 mcg by mouth every evening.   ketorolac 0.5 % ophthalmic solution Commonly known as:   ACULAR Place 1 drop into the left eye 4 (four) times daily.   levothyroxine 50 MCG tablet Commonly known as:  SYNTHROID, LEVOTHROID Take 50 mcg by mouth every morning.   loperamide 2 MG tablet Commonly known as:  IMODIUM A-D Take 2 mg by mouth 4 (four) times daily as needed for diarrhea or loose stools.   Melatonin 10 MG Tabs Take 1 tablet by mouth 3 times/day as needed-between meals & bedtime (sleep).   ofloxacin 0.3 % ophthalmic solution Commonly known as:  OCUFLOX Place 1 drop into the left eye 4 (four) times daily.   omeprazole 20 MG capsule Commonly known as:  PRILOSEC Take 20 mg by mouth daily.   prednisoLONE acetate 1 % ophthalmic suspension Commonly known as:  PRED FORTE Place 1 drop into the left eye 4 (four) times daily.   predniSONE 10 MG tablet Commonly known as:  DELTASONE Take 50 mg tablet 4/1 and taper down by 10 mg daily until complete   simvastatin 20 MG tablet Commonly known as:  ZOCOR Take 20 mg by mouth every evening.   torsemide 20 MG tablet Commonly known as:  DEMADEX Take 10 mg by mouth See admin instructions. Takes mon and fri only       Follow-up Information    Osborne Casco, MD Follow up.   Specialty:  Family Medicine Contact information: [address] [place] [address] [phone]            The results of significant diagnostics from this hospitalization (including imaging, microbiology, ancillary and laboratory) are listed below for reference.     Microbiology: No results found for this or any previous visit (from the past 240 hour(s)).   Labs: Basic Metabolic Panel:  Recent Labs Lab 07/21/16 1229 07/22/16 0704  NA 140 141  K 4.1 4.2  CL 110 110  CO2 19* 19*  GLUCOSE 100* 113*  BUN 26* 29*  CREATININE 1.42* 1.47*  CALCIUM 8.6* 8.1*   CBC:  Recent Labs Lab 07/21/16 1229 07/22/16 0704  WBC 13.5* 8.7  HGB 10.4* 10.1*  HCT 30.7* 30.4*  MCV 95.9 94.1  PLT 377 326   BNP (last  3 results)  Recent Labs  07/21/16 1548  BNP 416.2*   SIGNED: Time coordinating discharge: 30 minutes  Faye Ramsay, MD  [facility] 07/22/2016, 8:50 AM Pager [phone]  If 7PM-7AM, please contact night-coverage [email or web address] Password [email or web address]

## 2016-07-22 NOTE — Progress Notes (Signed)
Patient very angry, aggressive physically, cursing at night.  States "I just want the hell out of here".  Patient adamant that she is leaving immediately, that she does not want prescriptions for her medicine, she wants them called in so she does not have to wait.  Patient very irate, although this RN has no idea why.  AWaiting discharge orders.

## 2016-07-22 NOTE — Progress Notes (Signed)
Took patients Iv out, gave her DC papers, patient adamant that she be taken down to the [place] [facility] to wait for her ride.  Patient taken to [place] by RN to be picked up by ride.

## 2016-07-22 NOTE — Care Management CC44 (Signed)
Condition Code 44 Documentation Completed  Patient Details  Name: Meghan Wise MRN: [id number] Date of Birth: 1936-02-15   Condition Code 44 given:  Yes Patient signature on Condition Code 44 notice:  Yes (patient refused to sign) Documentation of 2 MD's agreement:  Yes Code 44 added to claim:  Yes    Carles Collet, RN 07/22/2016, 9:15 AM

## 2016-07-22 NOTE — Care Management Obs Status (Signed)
[place] NOTIFICATION   Patient Details  Name: Meghan Wise MRN: [id number] Date of Birth: 1936/04/01   Medicare Observation Status Notification Given:  Yes    Carles Collet, RN 07/22/2016, 9:15 AM

## 2016-07-25 ENCOUNTER — Encounter (HOSPITAL_COMMUNITY): Payer: Medicare Other

## 2016-07-25 DIAGNOSIS — Z5189 Encounter for other specified aftercare: Secondary | ICD-10-CM | POA: Insufficient documentation

## 2016-07-25 DIAGNOSIS — I10 Essential (primary) hypertension: Secondary | ICD-10-CM | POA: Insufficient documentation

## 2016-07-25 DIAGNOSIS — I5033 Acute on chronic diastolic (congestive) heart failure: Secondary | ICD-10-CM | POA: Insufficient documentation

## 2016-07-26 LAB — CULTURE, BLOOD (ROUTINE X 2)
CULTURE: NO GROWTH
CULTURE: NO GROWTH

## 2016-07-27 ENCOUNTER — Encounter (HOSPITAL_COMMUNITY)
Admission: RE | Admit: 2016-07-27 | Discharge: 2016-07-27 | Disposition: A | Discharge status: Home / Self Care | Payer: Medicare Other | Source: Ambulatory Visit | Attending: Internal Medicine | Admitting: Internal Medicine

## 2016-08-01 ENCOUNTER — Encounter (HOSPITAL_COMMUNITY)
Admission: RE | Admit: 2016-08-01 | Discharge: 2016-08-01 | Disposition: A | Discharge status: Home / Self Care | Payer: Medicare Other | Source: Ambulatory Visit | Attending: Internal Medicine | Admitting: Internal Medicine

## 2016-08-03 ENCOUNTER — Encounter (HOSPITAL_COMMUNITY): Payer: Medicare Other

## 2016-08-08 ENCOUNTER — Encounter (HOSPITAL_COMMUNITY)
Admission: RE | Admit: 2016-08-08 | Discharge: 2016-08-08 | Disposition: A | Discharge status: Home / Self Care | Payer: Medicare Other | Source: Ambulatory Visit | Attending: Internal Medicine | Admitting: Internal Medicine

## 2016-08-10 ENCOUNTER — Encounter (HOSPITAL_COMMUNITY)
Admission: RE | Admit: 2016-08-10 | Discharge: 2016-08-10 | Disposition: A | Discharge status: Home / Self Care | Payer: Medicare Other | Source: Ambulatory Visit | Attending: Internal Medicine | Admitting: Internal Medicine

## 2016-08-15 ENCOUNTER — Encounter (HOSPITAL_COMMUNITY): Payer: Medicare Other

## 2016-08-17 ENCOUNTER — Encounter (HOSPITAL_COMMUNITY)
Admission: RE | Admit: 2016-08-17 | Discharge: 2016-08-17 | Disposition: A | Discharge status: Home / Self Care | Payer: Medicare Other | Source: Ambulatory Visit | Attending: Internal Medicine | Admitting: Internal Medicine

## 2016-08-22 ENCOUNTER — Encounter (HOSPITAL_COMMUNITY): Payer: Medicare Other | Attending: Internal Medicine

## 2016-08-22 DIAGNOSIS — Z5189 Encounter for other specified aftercare: Secondary | ICD-10-CM | POA: Insufficient documentation

## 2016-08-22 DIAGNOSIS — I5033 Acute on chronic diastolic (congestive) heart failure: Secondary | ICD-10-CM | POA: Insufficient documentation

## 2016-08-22 DIAGNOSIS — I10 Essential (primary) hypertension: Secondary | ICD-10-CM | POA: Insufficient documentation

## 2016-08-24 ENCOUNTER — Encounter (HOSPITAL_COMMUNITY): Payer: Medicare Other

## 2016-08-29 ENCOUNTER — Encounter (HOSPITAL_COMMUNITY): Payer: Medicare Other

## 2016-08-29 ENCOUNTER — Telehealth (HOSPITAL_COMMUNITY): Payer: Self-pay | Admitting: Family Medicine

## 2016-08-29 NOTE — Telephone Encounter (Signed)
Pt called states still congested cancelled class for today... Meghan Wise

## 2016-08-31 ENCOUNTER — Encounter (HOSPITAL_COMMUNITY): Payer: Medicare Other

## 2016-09-05 ENCOUNTER — Encounter (HOSPITAL_COMMUNITY): Payer: Medicare Other

## 2016-09-07 ENCOUNTER — Encounter (HOSPITAL_COMMUNITY): Payer: Medicare Other

## 2016-09-12 ENCOUNTER — Encounter (HOSPITAL_COMMUNITY): Payer: Medicare Other

## 2016-09-14 ENCOUNTER — Encounter (HOSPITAL_COMMUNITY): Payer: Medicare Other

## 2016-09-19 ENCOUNTER — Encounter (HOSPITAL_COMMUNITY): Payer: Medicare Other

## 2016-09-21 ENCOUNTER — Encounter (HOSPITAL_COMMUNITY): Payer: Medicare Other

## 2016-09-26 ENCOUNTER — Encounter (HOSPITAL_COMMUNITY)
Admission: RE | Admit: 2016-09-26 | Discharge: 2016-09-26 | Disposition: A | Discharge status: Home / Self Care | Payer: Medicare Other | Source: Ambulatory Visit | Attending: Internal Medicine | Admitting: Internal Medicine

## 2016-09-26 DIAGNOSIS — I5033 Acute on chronic diastolic (congestive) heart failure: Secondary | ICD-10-CM | POA: Insufficient documentation

## 2016-09-26 DIAGNOSIS — Z5189 Encounter for other specified aftercare: Secondary | ICD-10-CM | POA: Insufficient documentation

## 2016-09-26 DIAGNOSIS — I10 Essential (primary) hypertension: Secondary | ICD-10-CM | POA: Insufficient documentation

## 2016-09-28 ENCOUNTER — Encounter (HOSPITAL_COMMUNITY)
Admission: RE | Admit: 2016-09-28 | Discharge: 2016-09-28 | Disposition: A | Discharge status: Home / Self Care | Payer: Medicare Other | Source: Ambulatory Visit | Attending: Internal Medicine | Admitting: Internal Medicine

## 2016-10-03 ENCOUNTER — Encounter (HOSPITAL_COMMUNITY): Payer: Medicare Other

## 2016-10-05 ENCOUNTER — Encounter (HOSPITAL_COMMUNITY)
Admission: RE | Admit: 2016-10-05 | Discharge: 2016-10-05 | Disposition: A | Discharge status: Home / Self Care | Payer: Medicare Other | Source: Ambulatory Visit | Attending: Internal Medicine | Admitting: Internal Medicine

## 2016-10-10 ENCOUNTER — Encounter (HOSPITAL_COMMUNITY)
Admission: RE | Admit: 2016-10-10 | Discharge: 2016-10-10 | Disposition: A | Discharge status: Home / Self Care | Payer: Medicare Other | Source: Ambulatory Visit | Attending: Internal Medicine | Admitting: Internal Medicine

## 2016-10-12 ENCOUNTER — Encounter (HOSPITAL_COMMUNITY): Payer: Medicare Other

## 2016-10-17 ENCOUNTER — Encounter (HOSPITAL_COMMUNITY): Payer: Medicare Other

## 2016-10-19 ENCOUNTER — Encounter (HOSPITAL_COMMUNITY): Payer: Medicare Other

## 2016-10-24 ENCOUNTER — Encounter (HOSPITAL_COMMUNITY)
Admission: RE | Admit: 2016-10-24 | Discharge: 2016-10-24 | Disposition: A | Discharge status: Home / Self Care | Payer: Self-pay | Source: Ambulatory Visit | Attending: Internal Medicine | Admitting: Internal Medicine

## 2016-10-24 DIAGNOSIS — I10 Essential (primary) hypertension: Secondary | ICD-10-CM | POA: Insufficient documentation

## 2016-10-24 DIAGNOSIS — I5033 Acute on chronic diastolic (congestive) heart failure: Secondary | ICD-10-CM | POA: Insufficient documentation

## 2016-10-24 DIAGNOSIS — Z5189 Encounter for other specified aftercare: Secondary | ICD-10-CM | POA: Insufficient documentation

## 2016-10-26 ENCOUNTER — Encounter (HOSPITAL_COMMUNITY): Payer: Self-pay

## 2016-10-31 ENCOUNTER — Encounter (HOSPITAL_COMMUNITY)
Admission: RE | Admit: 2016-10-31 | Discharge: 2016-10-31 | Disposition: A | Discharge status: Home / Self Care | Payer: Self-pay | Source: Ambulatory Visit | Attending: Internal Medicine | Admitting: Internal Medicine

## 2016-11-02 ENCOUNTER — Encounter (HOSPITAL_COMMUNITY)
Admission: RE | Admit: 2016-11-02 | Discharge: 2016-11-02 | Disposition: A | Discharge status: Home / Self Care | Payer: Self-pay | Source: Ambulatory Visit | Attending: Internal Medicine | Admitting: Internal Medicine

## 2016-11-07 ENCOUNTER — Encounter (HOSPITAL_COMMUNITY)
Admission: RE | Admit: 2016-11-07 | Discharge: 2016-11-07 | Disposition: A | Discharge status: Home / Self Care | Payer: Self-pay | Source: Ambulatory Visit | Attending: Internal Medicine | Admitting: Internal Medicine

## 2016-11-16 ENCOUNTER — Encounter (HOSPITAL_COMMUNITY)
Admission: RE | Admit: 2016-11-16 | Discharge: 2016-11-16 | Disposition: A | Discharge status: Home / Self Care | Payer: Self-pay | Source: Ambulatory Visit | Attending: Internal Medicine | Admitting: Internal Medicine

## 2016-11-21 ENCOUNTER — Encounter (HOSPITAL_COMMUNITY)
Admission: RE | Admit: 2016-11-21 | Discharge: 2016-11-21 | Disposition: A | Discharge status: Home / Self Care | Payer: Self-pay | Source: Ambulatory Visit | Attending: Internal Medicine | Admitting: Internal Medicine

## 2016-11-23 ENCOUNTER — Encounter (HOSPITAL_COMMUNITY)
Admission: RE | Admit: 2016-11-23 | Discharge: 2016-11-23 | Disposition: A | Discharge status: Home / Self Care | Payer: Self-pay | Source: Ambulatory Visit | Attending: Internal Medicine | Admitting: Internal Medicine

## 2016-11-23 DIAGNOSIS — Z5189 Encounter for other specified aftercare: Secondary | ICD-10-CM | POA: Insufficient documentation

## 2016-11-23 DIAGNOSIS — I10 Essential (primary) hypertension: Secondary | ICD-10-CM | POA: Insufficient documentation

## 2016-11-23 DIAGNOSIS — I5033 Acute on chronic diastolic (congestive) heart failure: Secondary | ICD-10-CM | POA: Insufficient documentation

## 2016-11-28 ENCOUNTER — Telehealth (HOSPITAL_COMMUNITY): Payer: Self-pay | Admitting: *Deleted

## 2016-11-28 ENCOUNTER — Other Ambulatory Visit: Payer: Self-pay | Admitting: Family Medicine

## 2016-11-28 ENCOUNTER — Encounter (HOSPITAL_COMMUNITY): Payer: Self-pay

## 2016-11-28 DIAGNOSIS — Z1231 Encounter for screening mammogram for malignant neoplasm of breast: Secondary | ICD-10-CM

## 2016-11-30 ENCOUNTER — Encounter (HOSPITAL_COMMUNITY): Admission: RE | Admit: 2016-11-30 | Payer: Self-pay | Source: Ambulatory Visit

## 2016-12-05 ENCOUNTER — Encounter (HOSPITAL_COMMUNITY)
Admission: RE | Admit: 2016-12-05 | Discharge: 2016-12-05 | Disposition: A | Discharge status: Home / Self Care | Payer: Self-pay | Source: Ambulatory Visit | Attending: Internal Medicine | Admitting: Internal Medicine

## 2016-12-07 ENCOUNTER — Encounter (HOSPITAL_COMMUNITY): Payer: Self-pay

## 2016-12-11 ENCOUNTER — Ambulatory Visit: Payer: Medicare Other

## 2016-12-12 ENCOUNTER — Encounter (HOSPITAL_COMMUNITY): Payer: Self-pay

## 2016-12-13 ENCOUNTER — Other Ambulatory Visit: Payer: Self-pay | Admitting: Family Medicine

## 2016-12-13 DIAGNOSIS — R748 Abnormal levels of other serum enzymes: Secondary | ICD-10-CM

## 2016-12-14 ENCOUNTER — Encounter (HOSPITAL_COMMUNITY)
Admission: RE | Admit: 2016-12-14 | Discharge: 2016-12-14 | Disposition: A | Discharge status: Home / Self Care | Payer: Self-pay | Source: Ambulatory Visit | Attending: Internal Medicine | Admitting: Internal Medicine

## 2016-12-19 ENCOUNTER — Encounter (HOSPITAL_COMMUNITY)
Admission: RE | Admit: 2016-12-19 | Discharge: 2016-12-19 | Disposition: A | Discharge status: Home / Self Care | Payer: Self-pay | Source: Ambulatory Visit | Attending: Internal Medicine | Admitting: Internal Medicine

## 2016-12-20 ENCOUNTER — Ambulatory Visit
Admission: RE | Admit: 2016-12-20 | Discharge: 2016-12-20 | Disposition: A | Discharge status: Home / Self Care | Payer: Medicare Other | Source: Ambulatory Visit | Attending: Family Medicine | Admitting: Family Medicine

## 2016-12-20 DIAGNOSIS — R748 Abnormal levels of other serum enzymes: Secondary | ICD-10-CM

## 2016-12-21 ENCOUNTER — Encounter (HOSPITAL_COMMUNITY)
Admission: RE | Admit: 2016-12-21 | Discharge: 2016-12-21 | Disposition: A | Discharge status: Home / Self Care | Payer: Self-pay | Source: Ambulatory Visit | Attending: Internal Medicine | Admitting: Internal Medicine

## 2016-12-22 ENCOUNTER — Ambulatory Visit
Admission: RE | Admit: 2016-12-22 | Discharge: 2016-12-22 | Disposition: A | Discharge status: Home / Self Care | Payer: Medicare Other | Source: Ambulatory Visit | Attending: Family Medicine | Admitting: Family Medicine

## 2016-12-22 DIAGNOSIS — Z1231 Encounter for screening mammogram for malignant neoplasm of breast: Secondary | ICD-10-CM

## 2016-12-26 ENCOUNTER — Encounter (HOSPITAL_COMMUNITY): Payer: Self-pay | Attending: Internal Medicine

## 2016-12-26 DIAGNOSIS — I10 Essential (primary) hypertension: Secondary | ICD-10-CM | POA: Insufficient documentation

## 2016-12-26 DIAGNOSIS — Z5189 Encounter for other specified aftercare: Secondary | ICD-10-CM | POA: Insufficient documentation

## 2016-12-26 DIAGNOSIS — I5033 Acute on chronic diastolic (congestive) heart failure: Secondary | ICD-10-CM | POA: Insufficient documentation

## 2016-12-28 ENCOUNTER — Encounter (HOSPITAL_COMMUNITY): Payer: Self-pay

## 2017-01-02 ENCOUNTER — Encounter (HOSPITAL_COMMUNITY): Payer: Self-pay

## 2017-01-02 ENCOUNTER — Ambulatory Visit
Admission: RE | Admit: 2017-01-02 | Discharge: 2017-01-02 | Disposition: A | Discharge status: Home / Self Care | Payer: Medicare Other | Source: Ambulatory Visit | Attending: Family Medicine | Admitting: Family Medicine

## 2017-01-02 ENCOUNTER — Other Ambulatory Visit: Payer: Self-pay | Admitting: Family Medicine

## 2017-01-02 DIAGNOSIS — R509 Fever, unspecified: Secondary | ICD-10-CM

## 2017-01-02 DIAGNOSIS — I272 Pulmonary hypertension, unspecified: Secondary | ICD-10-CM

## 2017-01-04 ENCOUNTER — Encounter (HOSPITAL_COMMUNITY): Payer: Self-pay

## 2017-01-09 ENCOUNTER — Encounter (HOSPITAL_COMMUNITY): Payer: Self-pay

## 2017-01-11 ENCOUNTER — Encounter (HOSPITAL_COMMUNITY): Admission: RE | Admit: 2017-01-11 | Payer: Self-pay | Source: Ambulatory Visit

## 2017-01-16 ENCOUNTER — Encounter (HOSPITAL_COMMUNITY): Payer: Self-pay

## 2017-01-16 ENCOUNTER — Emergency Department (HOSPITAL_COMMUNITY): Payer: Medicare Other

## 2017-01-16 ENCOUNTER — Emergency Department (HOSPITAL_COMMUNITY)
Admission: EM | Admit: 2017-01-16 | Discharge: 2017-01-16 | Disposition: A | Discharge status: Home / Self Care | Payer: Medicare Other | Attending: Emergency Medicine | Admitting: Emergency Medicine

## 2017-01-16 DIAGNOSIS — Z79899 Other long term (current) drug therapy: Secondary | ICD-10-CM | POA: Insufficient documentation

## 2017-01-16 DIAGNOSIS — K219 Gastro-esophageal reflux disease without esophagitis: Secondary | ICD-10-CM | POA: Insufficient documentation

## 2017-01-16 DIAGNOSIS — I5032 Chronic diastolic (congestive) heart failure: Secondary | ICD-10-CM | POA: Insufficient documentation

## 2017-01-16 DIAGNOSIS — E039 Hypothyroidism, unspecified: Secondary | ICD-10-CM | POA: Insufficient documentation

## 2017-01-16 DIAGNOSIS — Z87891 Personal history of nicotine dependence: Secondary | ICD-10-CM | POA: Diagnosis not present

## 2017-01-16 DIAGNOSIS — I130 Hypertensive heart and chronic kidney disease with heart failure and stage 1 through stage 4 chronic kidney disease, or unspecified chronic kidney disease: Secondary | ICD-10-CM | POA: Insufficient documentation

## 2017-01-16 DIAGNOSIS — N184 Chronic kidney disease, stage 4 (severe): Secondary | ICD-10-CM | POA: Diagnosis not present

## 2017-01-16 DIAGNOSIS — R0602 Shortness of breath: Secondary | ICD-10-CM | POA: Diagnosis present

## 2017-01-16 LAB — CBC WITH DIFFERENTIAL/PLATELET
Basophils Absolute: 0 K/uL (ref 0.0–0.1)
Basophils Relative: 0 %
Eosinophils Absolute: 0.3 K/uL (ref 0.0–0.7)
Eosinophils Relative: 2 %
HCT: 36.3 % (ref 36.0–46.0)
Hemoglobin: 11.9 g/dL — ABNORMAL LOW (ref 12.0–15.0)
Lymphocytes Relative: 10 %
Lymphs Abs: 1.2 K/uL (ref 0.7–4.0)
MCH: 30 pg (ref 26.0–34.0)
MCHC: 32.8 g/dL (ref 30.0–36.0)
MCV: 91.4 fL (ref 78.0–100.0)
Monocytes Absolute: 1.1 K/uL — ABNORMAL HIGH (ref 0.1–1.0)
Monocytes Relative: 9 %
Neutro Abs: 9.6 K/uL — ABNORMAL HIGH (ref 1.7–7.7)
Neutrophils Relative %: 79 %
Platelets: 400 K/uL (ref 150–400)
RBC: 3.97 MIL/uL (ref 3.87–5.11)
RDW: 15.6 % — ABNORMAL HIGH (ref 11.5–15.5)
WBC: 12.2 K/uL — ABNORMAL HIGH (ref 4.0–10.5)

## 2017-01-16 LAB — I-STAT TROPONIN, ED: Troponin i, poc: 0 ng/mL (ref 0.00–0.08)

## 2017-01-16 LAB — BASIC METABOLIC PANEL
Anion gap: 12 (ref 5–15)
BUN: 37 mg/dL — AB (ref 6–20)
CHLORIDE: 111 mmol/L (ref 101–111)
CO2: 23 mmol/L (ref 22–32)
Calcium: 9.7 mg/dL (ref 8.9–10.3)
Creatinine, Ser: 1.41 mg/dL — ABNORMAL HIGH (ref 0.44–1.00)
GFR calc Af Amer: 40 mL/min — ABNORMAL LOW (ref >60)
GFR calc non Af Amer: 34 mL/min — ABNORMAL LOW (ref >60)
GLUCOSE: 105 mg/dL — AB (ref 65–99)
POTASSIUM: 3.9 mmol/L (ref 3.5–5.1)
Sodium: 146 mmol/L — ABNORMAL HIGH (ref 135–145)

## 2017-01-16 LAB — BRAIN NATRIURETIC PEPTIDE: B Natriuretic Peptide: 146.8 pg/mL — ABNORMAL HIGH (ref 0.0–100.0)

## 2017-01-16 MED ORDER — GI COCKTAIL ~~LOC~~
30.0000 mL | Freq: Once | ORAL | Status: AC
  Administered 2017-01-16: 30 mL via ORAL
  Filled 2017-01-16: qty 30

## 2017-01-16 NOTE — ED Notes (Signed)
Pt ambulated in hall for about 3 minutes on 4L oxygen as per patients usual. Unable to obtain an 02 sat due to raynauds disease. Tried multiple times/sites. Pt stated during walk she felt a "little weak" but she states she hasnt had good sleep or hasnt ate since last night.

## 2017-01-16 NOTE — ED Provider Notes (Signed)
[facility] DEPT Provider Note   CSN: [id number] Arrival date & time: 01/16/17  4888   History   Chief Complaint Chief Complaint  Patient presents with  . Shortness of Breath    HPI Meghan [facility] is a 81 y.o. female.  With PMH of Scleroderma, pulmonary HTN, heart failure, CKD stage 4, HTN, HLD who is presenting with SOB and symptoms of acid reflux. The patient states that last night she noticed a bad taste in the back of her mouth when she was laying down for bed. She denies nausea and vomiting. She states she has acid reflux at baseline and she takes daily omeprazole. Last night she took gaviscon for her symptoms but got little to no relief with this intervention. In addition to her reflux, the patient also experienced worsening shortness of breath while attempting to sleep. She states this sometimes occurs at baseline, however it was worse last night than usual last night. She takes torsemide every Monday and Friday for her heart failure. She took this medication yesterday without difficulty. She denies chest pain, diaphoresis, abdominal pain, dysuria, change in bowel/bladder movements, and headaches.      Past Medical History:  Diagnosis Date  . Heart failure ([place])   . HTN (hypertension)   . Hyperlipidemia   . Hypothyroidism   . Kidney disease    Stage IV- Dr. Servando Salina -LOV 02-11-14  . Osteoarthritis   . Osteoporosis   . Pulmonary hypertension ([place])   . Raynaud disease    reactive with cold and stress mostly fingers.  . Scleroderma ([place])    lungs and kidneys  . Shortness of breath dyspnea    with exertion-in Cardiac rehab program at [facility].    Patient Active Problem List   Diagnosis Date Noted  . PNA (pneumonia) 07/22/2016  . Acute on chronic respiratory failure with hypoxia ([place]) 07/21/2016  . Community acquired pneumonia 07/21/2016  . CKD (chronic kidney disease) stage 4, GFR 15-29 ml/min ([facility]) 07/21/2016  . Anemia of chronic disease 07/21/2016  . AVM  (arteriovenous malformation) of duodenum, acquired 05/07/2014  . Chronic diastolic CHF (congestive heart failure), NYHA class 4 ([place]) 05/28/2012  . HTN (hypertension)   . Raynaud disease   . Hypothyroidism     Past Surgical History:  Procedure Laterality Date  . BREAST BIOPSY     x 5  . CARDIAC CATHETERIZATION     2'15 [facility]  . CHOLECYSTECTOMY    . CHOLECYSTECTOMY, LAPAROSCOPIC    . COLONOSCOPY WITH PROPOFOL N/A 05/07/2014   Procedure: COLONOSCOPY WITH PROPOFOL;  Surgeon: Inda Castle, MD;  Location: [facility];  Service: Endoscopy;  Laterality: N/A;  . ESOPHAGOGASTRODUODENOSCOPY (EGD) WITH PROPOFOL N/A 05/07/2014   Procedure: ESOPHAGOGASTRODUODENOSCOPY (EGD) WITH PROPOFOL;  Surgeon: Inda Castle, MD;  Location: [facility];  Service: Endoscopy;  Laterality: N/A;  . HOT HEMOSTASIS N/A 05/07/2014   Procedure: HOT HEMOSTASIS (ARGON PLASMA COAGULATION/BICAP);  Surgeon: Inda Castle, MD;  Location: [facility];  Service: Endoscopy;  Laterality: N/A;  deuodenal bulb  . SHOULDER ARTHROSCOPY W/ ROTATOR CUFF REPAIR Right     OB History    No data available       Home Medications    Prior to Admission medications   Medication Sig Start Date End Date Taking? Authorizing Provider  acetaminophen (TYLENOL) 500 MG tablet Take 1,000 mg by mouth every morning.   Yes [provider]  allopurinol (ZYLOPRIM) 100 MG tablet Take 100 mg by mouth daily. 11/13/16  Yes [provider]  ambrisentan (LETAIRIS) 10 MG tablet Take 10 mg by mouth every morning.    Yes [provider]  amLODipine (NORVASC) 5 MG tablet Take 5 mg by mouth every morning. 03/15/16  Yes [provider]  cetirizine (ZYRTEC) 10 MG tablet Take 10 mg by mouth daily as needed for allergies.   Yes [provider]  Cholecalciferol (VITAMIN D3) 2000 units capsule Take 2,000 Units by mouth daily.   Yes [provider]  folic acid (FOLVITE) 283 MCG tablet Take 400 mcg by mouth  every evening.    Yes [provider]  levothyroxine (SYNTHROID, LEVOTHROID) 50 MCG tablet Take 50 mcg by mouth every morning.   Yes [provider]  loperamide (IMODIUM A-D) 2 MG tablet Take 2 mg by mouth 4 (four) times daily as needed for diarrhea or loose stools.   Yes [provider]  Melatonin 10 MG TABS Take 1 tablet by mouth 3 times/day as needed-between meals & bedtime (sleep).    Yes [provider]  omeprazole (PRILOSEC) 20 MG capsule Take 20 mg by mouth daily.   Yes [provider]  OXYGEN Inhale 2-4 L into the lungs at bedtime. 4 L with exertion 2 L at night   Yes [provider]  simvastatin (ZOCOR) 20 MG tablet Take 20 mg by mouth every evening.   Yes [provider]  Tadalafil, PAH, (ADCIRCA) 20 MG TABS Take 40 mg by mouth every evening.  08/27/13  Yes [provider]  torsemide (DEMADEX) 20 MG tablet Take 10 mg by mouth See admin instructions. Takes mon and fri only   Yes [provider]    Family History Family History  Problem Relation Age of Onset  . Heart disease Mother   . Heart disease Father   . Cancer Unknown        husband--esophageal  . Diabetes Son   . Stroke Daughter   . Breast cancer Neg Hx     Social History Social History  Substance Use Topics  . Smoking status: Former Smoker    Packs/day: 1.50    Years: 60.00    Types: Cigarettes    Quit date: 04/25/1987  . Smokeless tobacco: Never Used  . Alcohol use 0.0 oz/week     Comment: OCCASIONALLY- rare     Allergies   Losartan; Ace inhibitors; Sulfa antibiotics; and Codeine   Review of Systems Review of Systems  Constitutional: Negative for fever.  HENT: Negative for congestion, sinus pain and sinus pressure.   Respiratory: Positive for shortness of breath. Negative for cough and choking.   Cardiovascular: Negative for chest pain, palpitations and leg swelling.  Gastrointestinal: Negative for abdominal distention,  abdominal pain, blood in stool, nausea and vomiting.  Genitourinary: Negative for dysuria, hematuria and urgency.  Neurological: Negative for weakness, light-headedness and headaches.    Physical Exam Updated Vital Signs BP (!) 127/56   Pulse 77   Temp 98.2 F (36.8 C) (Oral)   Resp (!) 25   Ht _0  (1.448 m)   Wt 47.2 kg (104 lb)   SpO2 96%   BMI 22.51 kg/m   Physical Exam  Constitutional:  Thin appearing woman sitting comfortably in bed with nasal cannula in place at 2-3L.   HENT:  Oropharynx dry without exudate  Eyes: Conjunctivae and EOM are normal.  Cardiovascular: Normal rate, regular rhythm and intact distal pulses.   No murmur heard. Pulmonary/Chest: Effort normal. She has no wheezes. She has no rales.  No  signs of cyanosis. No nasal flaring or accessory muscle use. No tachypnea and breathing comfortably with 2L [place] in place.  Abdominal: Soft. She exhibits no distension and no mass. There is no tenderness. There is no guarding.  Musculoskeletal: She exhibits no edema (of bilateral lower extremities) or tenderness (of bilateral lower extremities).  Lymphadenopathy:    She has no cervical adenopathy.  Skin: Skin is warm and dry. Capillary refill takes less than 2 seconds. No rash noted. No erythema.   ED Treatments / Results  Labs (all labs ordered are listed, but only abnormal results are displayed) Labs Reviewed  BASIC METABOLIC PANEL - Abnormal; Notable for the following:       Result Value   Sodium 146 (*)    Glucose, Bld 105 (*)    BUN 37 (*)    Creatinine, Ser 1.41 (*)    GFR calc non Af Amer 34 (*)    GFR calc Af Amer 40 (*)    All other components within normal limits  BRAIN NATRIURETIC PEPTIDE - Abnormal; Notable for the following:    B Natriuretic Peptide 146.8 (*)    All other components within normal limits  CBC WITH DIFFERENTIAL/PLATELET - Abnormal; Notable for the following:    WBC 12.2 (*)    Hemoglobin 11.9 (*)    RDW 15.6 (*)    Neutro Abs  9.6 (*)    Monocytes Absolute 1.1 (*)    All other components within normal limits  I-STAT TROPONIN, ED    EKG  EKG Interpretation  Date/Time:  Tuesday January 16 2017 08:56:45 EDT Ventricular Rate:  82 PR Interval:    QRS Duration: 85 QT Interval:  403 QTC Calculation: 471 R Axis:   26 Text Interpretation:  Sinus rhythm Ventricular premature complex Probable left atrial enlargement RSR' in V1 or V2, right VCD or RVH No significant change since last tracing Confirmed by Merrily Pew [phone]) on 01/16/2017 9:01:48 AM Also confirmed by Merrily Pew [phone]), editor Drema Pry [phone])  on 01/16/2017 9:30:45 AM       Radiology Dg Chest 2 View  Result Date: 01/16/2017 CLINICAL DATA:  Shortness of Breath EXAM: CHEST  2 VIEW COMPARISON:  01/02/2017 FINDINGS: Interstitial prominence throughout the lungs. Mild peribronchial thickening. No confluent airspace opacities or effusions. Heart is normal size. IMPRESSION: Peribronchial thickening and interstitial prominence, likely bronchitic changes. Electronically Signed   By: Rolm Baptise M.D.   On: 01/16/2017 09:40    Procedures Procedures (including critical care time)  Medications Ordered in ED Medications  gi cocktail (Maalox,Lidocaine,Donnatal) (30 mLs Oral Given 01/16/17 0912)    Initial Impression / Assessment and Plan / ED Course  I have reviewed the triage vital signs and the nursing notes.  Pertinent labs & imaging results that were available during my care of the patient were reviewed by me and considered in my medical decision making (see chart for details).  Clinical Course as of Jan 17 1120  Tue Jan 16, 2017  0805 SpO2: 96 % [MN]  0806 SpO2: 96 % [MN]    Clinical Course User Index [MN] Thomasene Ripple, MD   Patient with PMH of scleroderma, pulm HTN, CHF, CKD who is presenting with acute worsening of her SOB while laying down this evening. Her SOB has resolved with sitting up in bed and she is now back to  baseline. The patient does not appear to be volume overloaded on exam, as she does not have lower extremity swelling or crackles in her  lung bases. Patient not currently SOB but her endorsement of orhtopnea is concerning for volume overload. Patient denies chest pain and diaphoresis, but it is important to rule out ACS given her history of CHF and worsening of SOB. Will obtain EKG and iSTAT troponin. Will also obtain BMP and CBC to examine for electrolyte deficiencies, anemia, and WBC count. Will also obtain BNP and CXR to evaluate for pulmonary edema. Chest XR will be useful to rule out pneumonia as a cause of her SOB, however this is unlikely given that she is afebrile and has been recently treated by PCP for pneumonia.   She also experienced a bad taste in the back of her throat which was concerning for acid reflux. It is unclear whether or not gaviscon helped her symptoms. Patient denies nausea, vomiting, and chest pain. Will give GI cocktail and trial of PO to see if her symptoms clinically improve on this regimen as we await above test results.   Final Clinical Impressions(s) / ED Diagnoses   Final diagnoses:  Gastroesophageal reflux disease, esophagitis presence not specified   Patient's studies returned and were not concerning for ACS, CHF exacerbation, or pneumonia. Patient's symptoms of reflux improved with Maalox. Patient has tolerated PO intake without nausea, vomiting, or worsening SOB/acid reflux.  Patient ambulated in hallway without difficulty and on home oxygen levels, difficulty to obtain O2 saturation 2/2 Raynaud's disease and patient being cold throughout ED visit. During discussions, patient saturating >92% on 2L with good waveform on montir while laying in bed and talking with physician. Patient shows no cyanosis or signs of respiratory distress. She feels her SOB is at baseline while sitting, talking, and walking. Patient stable for discharge on home oxygen levels. Patient has  portable oxygen available to take home with her. Return precautions given and instructions to follow up with PCP to discuss recent ED visit.  New Prescriptions Current Discharge Medication List       Thomasene Ripple, MD 01/16/17 1127    Mesner, Corene Cornea, MD 01/16/17 [phone]

## 2017-01-16 NOTE — ED Notes (Signed)
1000 and 1030 data validate for vitals is incorrect due to not being able to obtain a steady waveform.

## 2017-01-16 NOTE — ED Triage Notes (Signed)
Per EMS, patient comes from assisted living. Last night at 1030 onset of SOB began. Worse this morning at 0600 when woke up. Took 3 tabs of gaviscon for acid reflux with no relief. Hx of pnemonia x1wk ago, finished z-pack yesterday. Hx pulm htn. Pt wears 2-4L [place] at night and with activity. Pt reports SOB better in route.

## 2017-01-16 NOTE — ED Notes (Signed)
Patient transported to X-ray 

## 2017-01-16 NOTE — ED Notes (Signed)
Bed: ZS82 Expected date:  Expected time:  Means of arrival:  Comments: 81 yo F/ Resp distress

## 2017-01-16 NOTE — Discharge Instructions (Addendum)
You were evaluated in the emergency room for acid reflux and shortness of breath. Your lab work and exams were reassuring that you did not have a heart attack, worsening of your heart failure, or pneumonia to cause your shortness of breath. Your symptoms had some improvement with supportive care, which included Maalox and fluid intake.   Please continue taking Maalox for your symptoms of acid reflux while you are at home.   Please follow up with your primary care physician and pulmonologist for shortness of breath.

## 2017-01-18 ENCOUNTER — Encounter (HOSPITAL_COMMUNITY): Payer: Self-pay

## 2017-01-23 ENCOUNTER — Encounter (HOSPITAL_COMMUNITY)
Admission: RE | Admit: 2017-01-23 | Discharge: 2017-01-23 | Disposition: A | Discharge status: Home / Self Care | Payer: Self-pay | Source: Ambulatory Visit | Attending: Internal Medicine | Admitting: Internal Medicine

## 2017-01-23 DIAGNOSIS — I5033 Acute on chronic diastolic (congestive) heart failure: Secondary | ICD-10-CM | POA: Insufficient documentation

## 2017-01-23 DIAGNOSIS — Z5189 Encounter for other specified aftercare: Secondary | ICD-10-CM | POA: Insufficient documentation

## 2017-01-23 DIAGNOSIS — I10 Essential (primary) hypertension: Secondary | ICD-10-CM | POA: Insufficient documentation

## 2017-01-25 ENCOUNTER — Encounter (HOSPITAL_COMMUNITY): Payer: Self-pay

## 2017-01-30 ENCOUNTER — Encounter (HOSPITAL_COMMUNITY)
Admission: RE | Admit: 2017-01-30 | Discharge: 2017-01-30 | Disposition: A | Discharge status: Home / Self Care | Payer: Self-pay | Source: Ambulatory Visit | Attending: Internal Medicine | Admitting: Internal Medicine

## 2017-02-01 ENCOUNTER — Encounter (HOSPITAL_COMMUNITY): Payer: Self-pay

## 2017-02-06 ENCOUNTER — Encounter (HOSPITAL_COMMUNITY): Payer: Self-pay

## 2017-02-08 ENCOUNTER — Encounter (HOSPITAL_COMMUNITY): Payer: Self-pay

## 2017-02-13 ENCOUNTER — Encounter (HOSPITAL_COMMUNITY)
Admission: RE | Admit: 2017-02-13 | Discharge: 2017-02-13 | Disposition: A | Discharge status: Home / Self Care | Payer: Self-pay | Source: Ambulatory Visit | Attending: Internal Medicine | Admitting: Internal Medicine

## 2017-02-15 ENCOUNTER — Encounter (HOSPITAL_COMMUNITY)
Admission: RE | Admit: 2017-02-15 | Discharge: 2017-02-15 | Disposition: A | Discharge status: Home / Self Care | Payer: Self-pay | Source: Ambulatory Visit | Attending: Internal Medicine | Admitting: Internal Medicine

## 2017-02-20 ENCOUNTER — Encounter (HOSPITAL_COMMUNITY)
Admission: RE | Admit: 2017-02-20 | Discharge: 2017-02-20 | Disposition: A | Discharge status: Home / Self Care | Payer: Self-pay | Source: Ambulatory Visit | Attending: Internal Medicine | Admitting: Internal Medicine

## 2017-02-22 ENCOUNTER — Encounter (HOSPITAL_COMMUNITY)
Admission: RE | Admit: 2017-02-22 | Discharge: 2017-02-22 | Disposition: A | Discharge status: Home / Self Care | Payer: Self-pay | Source: Ambulatory Visit | Attending: Internal Medicine | Admitting: Internal Medicine

## 2017-02-22 DIAGNOSIS — Z5189 Encounter for other specified aftercare: Secondary | ICD-10-CM | POA: Insufficient documentation

## 2017-02-22 DIAGNOSIS — I10 Essential (primary) hypertension: Secondary | ICD-10-CM | POA: Insufficient documentation

## 2017-02-22 DIAGNOSIS — I5033 Acute on chronic diastolic (congestive) heart failure: Secondary | ICD-10-CM | POA: Insufficient documentation

## 2017-02-27 ENCOUNTER — Encounter (HOSPITAL_COMMUNITY): Payer: Self-pay

## 2017-03-01 ENCOUNTER — Encounter (HOSPITAL_COMMUNITY): Payer: Self-pay

## 2017-03-06 ENCOUNTER — Encounter (HOSPITAL_COMMUNITY)
Admission: RE | Admit: 2017-03-06 | Discharge: 2017-03-06 | Disposition: A | Discharge status: Home / Self Care | Payer: Self-pay | Source: Ambulatory Visit | Attending: Internal Medicine | Admitting: Internal Medicine

## 2017-03-08 ENCOUNTER — Encounter (HOSPITAL_COMMUNITY): Payer: Self-pay

## 2017-03-13 ENCOUNTER — Encounter (HOSPITAL_COMMUNITY)
Admission: RE | Admit: 2017-03-13 | Discharge: 2017-03-13 | Disposition: A | Discharge status: Home / Self Care | Payer: Self-pay | Source: Ambulatory Visit | Attending: Internal Medicine | Admitting: Internal Medicine

## 2017-03-20 ENCOUNTER — Encounter (HOSPITAL_COMMUNITY): Payer: Self-pay

## 2017-03-22 ENCOUNTER — Encounter (HOSPITAL_COMMUNITY): Payer: Self-pay

## 2017-03-27 ENCOUNTER — Encounter (HOSPITAL_COMMUNITY)
Admission: RE | Admit: 2017-03-27 | Discharge: 2017-03-27 | Disposition: A | Discharge status: Home / Self Care | Payer: Self-pay | Source: Ambulatory Visit | Attending: Internal Medicine | Admitting: Internal Medicine

## 2017-03-27 DIAGNOSIS — Z5189 Encounter for other specified aftercare: Secondary | ICD-10-CM | POA: Insufficient documentation

## 2017-03-27 DIAGNOSIS — I10 Essential (primary) hypertension: Secondary | ICD-10-CM | POA: Insufficient documentation

## 2017-03-27 DIAGNOSIS — I5033 Acute on chronic diastolic (congestive) heart failure: Secondary | ICD-10-CM | POA: Insufficient documentation

## 2017-03-29 ENCOUNTER — Encounter (HOSPITAL_COMMUNITY): Payer: Self-pay

## 2017-04-03 ENCOUNTER — Encounter (HOSPITAL_COMMUNITY): Payer: Self-pay

## 2017-04-05 ENCOUNTER — Encounter (HOSPITAL_COMMUNITY): Payer: Self-pay

## 2017-04-10 ENCOUNTER — Encounter (HOSPITAL_COMMUNITY)
Admission: RE | Admit: 2017-04-10 | Discharge: 2017-04-10 | Disposition: A | Discharge status: Home / Self Care | Payer: Self-pay | Source: Ambulatory Visit | Attending: Internal Medicine | Admitting: Internal Medicine

## 2017-04-12 ENCOUNTER — Encounter (HOSPITAL_COMMUNITY)
Admission: RE | Admit: 2017-04-12 | Discharge: 2017-04-12 | Disposition: A | Discharge status: Home / Self Care | Payer: Self-pay | Source: Ambulatory Visit | Attending: Internal Medicine | Admitting: Internal Medicine

## 2017-04-19 ENCOUNTER — Encounter (HOSPITAL_COMMUNITY): Payer: Self-pay

## 2017-04-26 ENCOUNTER — Encounter (HOSPITAL_COMMUNITY)
Admission: RE | Admit: 2017-04-26 | Discharge: 2017-04-26 | Disposition: A | Discharge status: Home / Self Care | Payer: Self-pay | Source: Ambulatory Visit | Attending: Internal Medicine | Admitting: Internal Medicine

## 2017-04-26 DIAGNOSIS — Z5189 Encounter for other specified aftercare: Secondary | ICD-10-CM | POA: Insufficient documentation

## 2017-04-26 DIAGNOSIS — I10 Essential (primary) hypertension: Secondary | ICD-10-CM | POA: Insufficient documentation

## 2017-04-26 DIAGNOSIS — I5033 Acute on chronic diastolic (congestive) heart failure: Secondary | ICD-10-CM | POA: Insufficient documentation

## 2017-05-01 ENCOUNTER — Encounter (HOSPITAL_COMMUNITY)
Admission: RE | Admit: 2017-05-01 | Discharge: 2017-05-01 | Disposition: A | Discharge status: Home / Self Care | Payer: Self-pay | Source: Ambulatory Visit | Attending: Internal Medicine | Admitting: Internal Medicine

## 2017-05-03 ENCOUNTER — Encounter (HOSPITAL_COMMUNITY)
Admission: RE | Admit: 2017-05-03 | Discharge: 2017-05-03 | Disposition: A | Discharge status: Home / Self Care | Payer: Self-pay | Source: Ambulatory Visit | Attending: Internal Medicine | Admitting: Internal Medicine

## 2017-05-08 ENCOUNTER — Encounter (HOSPITAL_COMMUNITY): Payer: Self-pay

## 2017-05-10 ENCOUNTER — Encounter (HOSPITAL_COMMUNITY): Payer: Self-pay

## 2017-05-15 ENCOUNTER — Encounter (HOSPITAL_COMMUNITY)
Admission: RE | Admit: 2017-05-15 | Discharge: 2017-05-15 | Disposition: A | Discharge status: Home / Self Care | Payer: Medicare Other | Source: Ambulatory Visit | Attending: Internal Medicine | Admitting: Internal Medicine

## 2017-05-17 ENCOUNTER — Encounter (HOSPITAL_COMMUNITY): Payer: Self-pay

## 2017-05-22 ENCOUNTER — Encounter (HOSPITAL_COMMUNITY)
Admission: RE | Admit: 2017-05-22 | Discharge: 2017-05-22 | Disposition: A | Discharge status: Home / Self Care | Payer: Self-pay | Source: Ambulatory Visit | Attending: Internal Medicine | Admitting: Internal Medicine

## 2017-05-24 ENCOUNTER — Encounter (HOSPITAL_COMMUNITY): Payer: Self-pay

## 2017-05-29 ENCOUNTER — Encounter (HOSPITAL_COMMUNITY): Payer: Self-pay

## 2017-05-29 DIAGNOSIS — I5033 Acute on chronic diastolic (congestive) heart failure: Secondary | ICD-10-CM | POA: Insufficient documentation

## 2017-05-29 DIAGNOSIS — Z5189 Encounter for other specified aftercare: Secondary | ICD-10-CM | POA: Insufficient documentation

## 2017-05-29 DIAGNOSIS — I10 Essential (primary) hypertension: Secondary | ICD-10-CM | POA: Insufficient documentation

## 2017-05-31 ENCOUNTER — Encounter (HOSPITAL_COMMUNITY): Payer: Self-pay

## 2017-06-05 ENCOUNTER — Encounter (HOSPITAL_COMMUNITY)
Admission: RE | Admit: 2017-06-05 | Discharge: 2017-06-05 | Disposition: A | Discharge status: Home / Self Care | Payer: Self-pay | Source: Ambulatory Visit | Attending: Internal Medicine | Admitting: Internal Medicine

## 2017-06-07 ENCOUNTER — Encounter (HOSPITAL_COMMUNITY): Payer: Self-pay

## 2017-06-12 ENCOUNTER — Encounter (HOSPITAL_COMMUNITY)
Admission: RE | Admit: 2017-06-12 | Discharge: 2017-06-12 | Disposition: A | Discharge status: Home / Self Care | Payer: Self-pay | Source: Ambulatory Visit | Attending: Internal Medicine | Admitting: Internal Medicine

## 2017-06-14 ENCOUNTER — Encounter (HOSPITAL_COMMUNITY)
Admission: RE | Admit: 2017-06-14 | Discharge: 2017-06-14 | Disposition: A | Discharge status: Home / Self Care | Payer: Self-pay | Source: Ambulatory Visit | Attending: Internal Medicine | Admitting: Internal Medicine

## 2017-06-19 ENCOUNTER — Encounter (HOSPITAL_COMMUNITY)
Admission: RE | Admit: 2017-06-19 | Discharge: 2017-06-19 | Disposition: A | Discharge status: Home / Self Care | Payer: Self-pay | Source: Ambulatory Visit | Attending: Internal Medicine | Admitting: Internal Medicine

## 2017-06-21 ENCOUNTER — Encounter (HOSPITAL_COMMUNITY): Payer: Self-pay

## 2017-06-28 ENCOUNTER — Encounter (HOSPITAL_COMMUNITY)
Admission: RE | Admit: 2017-06-28 | Discharge: 2017-06-28 | Disposition: A | Discharge status: Home / Self Care | Payer: Medicare Other | Source: Ambulatory Visit | Attending: Internal Medicine | Admitting: Internal Medicine

## 2017-06-28 DIAGNOSIS — I10 Essential (primary) hypertension: Secondary | ICD-10-CM | POA: Diagnosis not present

## 2017-06-28 DIAGNOSIS — Z5189 Encounter for other specified aftercare: Secondary | ICD-10-CM | POA: Diagnosis not present

## 2017-06-28 DIAGNOSIS — I5033 Acute on chronic diastolic (congestive) heart failure: Secondary | ICD-10-CM | POA: Insufficient documentation

## 2017-07-03 ENCOUNTER — Encounter (HOSPITAL_COMMUNITY): Payer: Medicare Other

## 2017-07-05 ENCOUNTER — Encounter (HOSPITAL_COMMUNITY): Payer: Medicare Other

## 2017-07-10 ENCOUNTER — Encounter (HOSPITAL_COMMUNITY): Payer: Medicare Other

## 2017-07-12 ENCOUNTER — Encounter (HOSPITAL_COMMUNITY): Payer: Self-pay | Admitting: *Deleted

## 2017-07-12 ENCOUNTER — Encounter (HOSPITAL_COMMUNITY): Payer: Medicare Other

## 2017-07-12 NOTE — Progress Notes (Signed)
M[facility] has decided to drop from the Pulmonary Maintenance program. She states that she wants to try to exercise at the gym at her assisted living facility. We have encouraged her to contact us if we can be of any assistance to her in the future.

## 2017-07-17 ENCOUNTER — Encounter (HOSPITAL_COMMUNITY): Payer: Medicare Other

## 2017-07-19 ENCOUNTER — Encounter (HOSPITAL_COMMUNITY): Payer: Medicare Other

## 2017-07-24 ENCOUNTER — Encounter (HOSPITAL_COMMUNITY): Payer: Medicare Other | Attending: Internal Medicine

## 2017-07-24 DIAGNOSIS — I10 Essential (primary) hypertension: Secondary | ICD-10-CM | POA: Insufficient documentation

## 2017-07-24 DIAGNOSIS — Z5189 Encounter for other specified aftercare: Secondary | ICD-10-CM | POA: Insufficient documentation

## 2017-07-24 DIAGNOSIS — I5033 Acute on chronic diastolic (congestive) heart failure: Secondary | ICD-10-CM | POA: Insufficient documentation

## 2017-07-26 ENCOUNTER — Encounter (HOSPITAL_COMMUNITY): Payer: Medicare Other

## 2017-07-31 ENCOUNTER — Encounter (HOSPITAL_COMMUNITY): Payer: Medicare Other

## 2017-08-02 ENCOUNTER — Encounter (HOSPITAL_COMMUNITY): Payer: Medicare Other

## 2017-08-07 ENCOUNTER — Encounter (HOSPITAL_COMMUNITY): Payer: Medicare Other

## 2017-08-09 ENCOUNTER — Encounter (HOSPITAL_COMMUNITY): Payer: Medicare Other

## 2017-08-14 ENCOUNTER — Encounter (HOSPITAL_COMMUNITY): Payer: Medicare Other

## 2017-08-16 ENCOUNTER — Encounter (HOSPITAL_COMMUNITY): Payer: Medicare Other

## 2017-08-21 ENCOUNTER — Encounter (HOSPITAL_COMMUNITY): Payer: Medicare Other

## 2017-08-23 ENCOUNTER — Encounter (HOSPITAL_COMMUNITY): Payer: Medicare Other

## 2017-08-28 ENCOUNTER — Encounter (HOSPITAL_COMMUNITY): Payer: Medicare Other

## 2017-08-30 ENCOUNTER — Encounter (HOSPITAL_COMMUNITY): Payer: Medicare Other

## 2017-09-04 ENCOUNTER — Encounter (HOSPITAL_COMMUNITY): Payer: Medicare Other

## 2017-09-06 ENCOUNTER — Encounter (HOSPITAL_COMMUNITY): Payer: Medicare Other

## 2017-09-11 ENCOUNTER — Encounter (HOSPITAL_COMMUNITY): Payer: Medicare Other

## 2017-09-13 ENCOUNTER — Encounter (HOSPITAL_COMMUNITY): Payer: Medicare Other

## 2017-09-18 ENCOUNTER — Encounter (HOSPITAL_COMMUNITY): Payer: Medicare Other

## 2017-09-20 ENCOUNTER — Encounter (HOSPITAL_COMMUNITY): Payer: Medicare Other

## 2017-09-25 ENCOUNTER — Encounter (HOSPITAL_COMMUNITY): Payer: Medicare Other

## 2017-09-27 ENCOUNTER — Encounter (HOSPITAL_COMMUNITY): Payer: Medicare Other

## 2017-10-02 ENCOUNTER — Encounter (HOSPITAL_COMMUNITY): Payer: Medicare Other

## 2017-10-04 ENCOUNTER — Encounter (HOSPITAL_COMMUNITY): Payer: Medicare Other

## 2017-10-09 ENCOUNTER — Encounter (HOSPITAL_COMMUNITY): Payer: Medicare Other

## 2017-10-11 ENCOUNTER — Encounter (HOSPITAL_COMMUNITY): Payer: Medicare Other

## 2017-10-16 ENCOUNTER — Encounter (HOSPITAL_COMMUNITY): Payer: Medicare Other

## 2017-10-18 ENCOUNTER — Encounter (HOSPITAL_COMMUNITY): Payer: Medicare Other

## 2017-10-23 ENCOUNTER — Encounter (HOSPITAL_COMMUNITY): Payer: Medicare Other

## 2017-10-30 ENCOUNTER — Encounter (HOSPITAL_COMMUNITY): Payer: Medicare Other

## 2017-11-01 ENCOUNTER — Encounter (HOSPITAL_COMMUNITY): Payer: Medicare Other

## 2017-11-06 ENCOUNTER — Encounter (HOSPITAL_COMMUNITY): Payer: Medicare Other

## 2017-11-08 ENCOUNTER — Encounter (HOSPITAL_COMMUNITY): Payer: Medicare Other

## 2017-11-08 ENCOUNTER — Other Ambulatory Visit: Payer: Self-pay | Admitting: Family Medicine

## 2017-11-08 DIAGNOSIS — Z1231 Encounter for screening mammogram for malignant neoplasm of breast: Secondary | ICD-10-CM

## 2017-11-13 ENCOUNTER — Encounter (HOSPITAL_COMMUNITY): Payer: Medicare Other

## 2017-11-15 ENCOUNTER — Encounter (HOSPITAL_COMMUNITY): Payer: Medicare Other

## 2017-11-20 ENCOUNTER — Encounter (HOSPITAL_COMMUNITY): Payer: Medicare Other

## 2017-11-22 ENCOUNTER — Encounter (HOSPITAL_COMMUNITY): Payer: Medicare Other

## 2017-11-27 ENCOUNTER — Encounter (HOSPITAL_COMMUNITY): Payer: Medicare Other

## 2017-11-29 ENCOUNTER — Encounter (HOSPITAL_COMMUNITY): Payer: Medicare Other

## 2017-12-04 ENCOUNTER — Encounter (HOSPITAL_COMMUNITY): Payer: Medicare Other

## 2017-12-06 ENCOUNTER — Encounter (HOSPITAL_COMMUNITY): Payer: Medicare Other

## 2017-12-11 ENCOUNTER — Encounter (HOSPITAL_COMMUNITY): Payer: Medicare Other

## 2017-12-13 ENCOUNTER — Encounter (HOSPITAL_COMMUNITY): Payer: Medicare Other

## 2017-12-18 ENCOUNTER — Encounter (HOSPITAL_COMMUNITY): Payer: Medicare Other

## 2017-12-20 ENCOUNTER — Encounter (HOSPITAL_COMMUNITY): Payer: Medicare Other

## 2017-12-25 ENCOUNTER — Encounter (HOSPITAL_COMMUNITY): Payer: Medicare Other

## 2017-12-26 ENCOUNTER — Ambulatory Visit
Admission: RE | Admit: 2017-12-26 | Discharge: 2017-12-26 | Disposition: A | Discharge status: Home / Self Care | Payer: Medicare Other | Source: Ambulatory Visit | Attending: Family Medicine | Admitting: Family Medicine

## 2017-12-26 DIAGNOSIS — Z1231 Encounter for screening mammogram for malignant neoplasm of breast: Secondary | ICD-10-CM

## 2017-12-27 ENCOUNTER — Encounter (HOSPITAL_COMMUNITY): Payer: Medicare Other

## 2018-01-01 ENCOUNTER — Encounter (HOSPITAL_COMMUNITY): Payer: Medicare Other

## 2018-01-03 ENCOUNTER — Encounter (HOSPITAL_COMMUNITY): Payer: Medicare Other

## 2018-01-08 ENCOUNTER — Encounter (HOSPITAL_COMMUNITY): Payer: Medicare Other

## 2018-01-10 ENCOUNTER — Encounter (HOSPITAL_COMMUNITY): Payer: Medicare Other

## 2018-01-15 ENCOUNTER — Encounter (HOSPITAL_COMMUNITY): Payer: Medicare Other

## 2018-01-17 ENCOUNTER — Encounter (HOSPITAL_COMMUNITY): Payer: Medicare Other

## 2018-01-22 ENCOUNTER — Encounter (HOSPITAL_COMMUNITY): Payer: Medicare Other

## 2018-02-08 ENCOUNTER — Other Ambulatory Visit (HOSPITAL_COMMUNITY): Payer: Self-pay | Admitting: Pulmonary Disease

## 2018-02-08 ENCOUNTER — Ambulatory Visit: Payer: Medicare Other | Admitting: Pulmonary Disease

## 2018-02-08 ENCOUNTER — Encounter: Payer: Self-pay | Admitting: Pulmonary Disease

## 2018-02-08 VITALS — BP 126/58 | HR 91 | Ht 58.47 in | Wt 106.0 lb

## 2018-02-08 DIAGNOSIS — R1310 Dysphagia, unspecified: Secondary | ICD-10-CM | POA: Diagnosis not present

## 2018-02-08 DIAGNOSIS — J849 Interstitial pulmonary disease, unspecified: Secondary | ICD-10-CM

## 2018-02-08 DIAGNOSIS — I2721 Secondary pulmonary arterial hypertension: Secondary | ICD-10-CM

## 2018-02-08 NOTE — Progress Notes (Signed)
Synopsis: Referred in 01/2018 for Pulmonary hypertension in the setting of underlying scleroderma.  Subjective:   PATIENT ID: Meghan Wise GENDER: female DOB: 12-Jun-1935, MRN: [id number]   HPI  Chief Complaint  Patient presents with  . New Consult    PAH, previously seen at [place]   This is a pleasant 82 year old female with a past medical history significant for scleroderma associated pulmonary hypertension who comes to my clinic today to establish care for the same.  She says that she smoked 1 pack of cigarettes daily for 30 years and quit in 1989.  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  In 2015 she started having worsening shortness of breath and high calcium values and she was ultimately hospitalized for this.  She was found to have lymphadenopathy on her chest and pulmonary hypertension.  Her care from 2015 started at [facility].  She had a right heart catheterization at that time that showed evidence of World Health Organization Group 1 pulmonary hypertension and she was diagnosed with scleroderma.  Since that diagnosis she has had problems with dysphasia and has had her esophagus stretched at least once by Dr. Benson Norway here in town.  She has been treated with ambrisentan and tadalafil successfully as well as torsemide.  In 2019 she has noticed increasing shortness of breath and has been more dependent on oxygen.  She says that she was originally started on oxygen in 2016 and was only using it at night and on exertion.  However, she has noted that she has needed it more frequently with exertion and sometimes she wears it at rest.  She tells me that she had an episode of pneumonia in 2018 but she fully recovered from that and she has not had recurrent respiratory infections in 2019.  She explained this to her cardiology team at [facility] who ordered a CT scan, right heart catheterization and 6-minute walk.  The 6-minute walk distance was down and the right heart catheterization  showed improvement in her pulmonary vascular pressures.  A CT scan suggested that there was progression of pulmonary parenchymal disease and a new finding of groundglass in the base.  She also notes worsening dysphagia for the last year.  She says that rice in particular gets stuck in her chest.  She thinks that this is likely related to her scleroderma  She also follows with Dr. Dorathy Daft locally for her scleroderma.   Family history: daughter has psoriatic arthritis, has history of a stroke, had genetic testing positive for MTHR mutation> she says that she has this gene mutation as well.   Past Medical History:  Diagnosis Date  . Heart failure ([place])   . HTN (hypertension)   . Hyperlipidemia   . Hypothyroidism   . Kidney disease    Stage IV- Dr. Servando Salina -LOV 02-11-14  . Osteoarthritis   . Osteoporosis   . Pulmonary hypertension ([place])   . Raynaud disease    reactive with cold and stress mostly fingers.  . Scleroderma ([place])    lungs and kidneys  . Shortness of breath dyspnea    with exertion-in Cardiac rehab program at [facility].     Family History  Problem Relation Age of Onset  . Heart disease Mother   . Heart disease Father   . Cancer Unknown        husband--esophageal  . Diabetes Son   . Stroke Daughter   . Breast cancer Neg Hx      Social History  Socioeconomic History  . Marital status: Widowed    Spouse name: Not on file  . Number of children: 5  . Years of education: Not on file  . Highest education level: Not on file  Occupational History  . Occupation: Retired    Comment: Library Asst.  Social Needs  . Financial resource strain: Not on file  . Food insecurity:    Worry: Not on file    Inability: Not on file  . Transportation needs:    Medical: Not on file    Non-medical: Not on file  Tobacco Use  . Smoking status: Former Smoker    Packs/day: 1.50    Years: 60.00    Pack years: 90.00    Types: Cigarettes    Last attempt to quit: 04/25/1987    Years  since quitting: 30.8  . Smokeless tobacco: Never Used  Substance and Sexual Activity  . Alcohol use: Yes    Alcohol/week: 0.0 standard drinks    Comment: OCCASIONALLY- rare  . Drug use: No  . Sexual activity: Not on file  Lifestyle  . Physical activity:    Days per week: Not on file    Minutes per session: Not on file  . Stress: Not on file  Relationships  . Social connections:    Talks on phone: Not on file    Gets together: Not on file    Attends religious service: Not on file    Active member of club or organization: Not on file    Attends meetings of clubs or organizations: Not on file    Relationship status: Not on file  . Intimate partner violence:    Fear of current or ex partner: Not on file    Emotionally abused: Not on file    Physically abused: Not on file    Forced sexual activity: Not on file  Other Topics Concern  . Not on file  Social History Narrative  . Not on file     Allergies  Allergen Reactions  . Losartan Other (See Comments) and Cough    High calcium count and renal problems  . Ace Inhibitors Other (See Comments) and Cough    Dizziness and coughing   . Sulfa Antibiotics Hives  . Codeine Nausea Only     Outpatient Medications Prior to Visit  Medication Sig Dispense Refill  . acetaminophen (TYLENOL) 500 MG tablet Take 1,000 mg by mouth every morning.    Marland Kitchen allopurinol (ZYLOPRIM) 100 MG tablet Take 100 mg by mouth daily.  0  . ambrisentan (LETAIRIS) 10 MG tablet Take 10 mg by mouth every morning.     Marland Kitchen amLODipine (NORVASC) 5 MG tablet Take 5 mg by mouth every morning.    . Cholecalciferol (VITAMIN D3) 2000 units capsule Take 2,000 Units by mouth daily.    Marland Kitchen denosumab (PROLIA) 60 MG/ML SOSY injection Inject 60 mg into the skin every 6 (six) months.    . fluticasone (FLONASE) 50 MCG/ACT nasal spray SPRAY 1 SPRAY INTO EACH NOSTRIL EVERY DAY  1  . folic acid (FOLVITE) 595 MCG tablet Take 400 mcg by mouth every evening.     Marland Kitchen levothyroxine  (SYNTHROID, LEVOTHROID) 50 MCG tablet Take 50 mcg by mouth every morning.    . loperamide (IMODIUM A-D) 2 MG tablet Take 2 mg by mouth 4 (four) times daily as needed for diarrhea or loose stools.    . Melatonin 10 MG TABS Take 1 tablet by mouth 3 times/day as needed-between meals &  bedtime (sleep).     Marland Kitchen omeprazole (PRILOSEC) 20 MG capsule Take 20 mg by mouth daily.    . OXYGEN Inhale 2-4 L into the lungs at bedtime. 4 L with exertion 2 L at night    . Tadalafil, PAH, (ADCIRCA) 20 MG TABS Take 40 mg by mouth every evening.     . torsemide (DEMADEX) 20 MG tablet Take 10 mg by mouth See admin instructions. Takes mon and fri only    . cetirizine (ZYRTEC) 10 MG tablet Take 10 mg by mouth daily as needed for allergies.    . simvastatin (ZOCOR) 20 MG tablet Take 20 mg by mouth every evening.     No facility-administered medications prior to visit.     Review of Systems  Constitutional: Positive for weight loss. Negative for chills, fever and malaise/fatigue.  HENT: Negative for congestion, ear pain, nosebleeds, sinus pain and sore throat.   Eyes: Negative for photophobia, pain, discharge and redness.  Respiratory: Positive for cough and shortness of breath. Negative for hemoptysis, sputum production and wheezing.   Cardiovascular: Positive for palpitations and leg swelling. Negative for chest pain, orthopnea and PND.  Gastrointestinal: Negative for abdominal pain, constipation, diarrhea, nausea and vomiting.  Genitourinary: Negative for dysuria, frequency, hematuria and urgency.  Musculoskeletal: Negative for back pain, joint pain, myalgias and neck pain.  Skin: Negative for itching and rash.  Neurological: Negative for tingling, tremors, sensory change, speech change, focal weakness, seizures, weakness and headaches.  Endo/Heme/Allergies: Does not bruise/bleed easily.  Psychiatric/Behavioral: Negative for depression, memory loss, substance abuse and suicidal ideas. The patient is not  nervous/anxious.       Objective:  Physical Exam   Vitals:   02/08/18 0925  BP: (!) 126/58  Pulse: 91  SpO2: 95%  Weight: 106 lb (48.1 kg)  Height: 4' 10.47" (1.485 m)    Gen: chronically ill appearing, elderly, no acute distress HENT: NCAT, OP clear, neck supple without masses Eyes: PERRL, EOMi Lymph: no cervical lymphadenopathy PULM: CTA B CV: RRR, no mgr, no JVD GI: BS+, soft, nontender, no hsm Derm: Oral telangiectasias noted, challenge ectasias of her fingers noted, Raynaud's phenomena noted. MSK: normal bulk and tone Neuro: A&Ox4, CN II-XII intact, strength 5/5 in all 4 extremities Psyche: normal mood and affect   CBC    Component Value Date/Time   WBC 12.2 (H) 01/16/2017 0905   RBC 3.97 01/16/2017 0905   HGB 11.9 (L) 01/16/2017 0905   HCT 36.3 01/16/2017 0905   PLT 400 01/16/2017 0905   MCV 91.4 01/16/2017 0905   MCH 30.0 01/16/2017 0905   MCHC 32.8 01/16/2017 0905   RDW 15.6 (H) 01/16/2017 0905   LYMPHSABS 1.2 01/16/2017 0905   MONOABS 1.1 (H) 01/16/2017 0905   EOSABS 0.3 01/16/2017 0905   BASOSABS 0.0 01/16/2017 0905     Chest imaging: January 11, 2018 CT chest [facility]: Basilar predominant groundglass and septal thickening with scattered irregular pulmonary nodules stable to minimally progressed compared to the previous study have been present over several years with only mild progression, favored to be secondary to patient's underlying connective tissue disease stable hilar mediastinal adenopathy, minimal new left lower lobe groundglass opacities likely represent superimposed aspiration  PFT:  Labs:  Path:  Echo: February 2019 echocardiogram from [facility] normal LV systolic function, normal right ventricular systolic function, no valvular stenosis, RVSP 57 mmHg  Heart Catheterization: January 11, 2018 right heart catheterization from [facility] right atrial 8, PA pressure 72/32 mean 42  wedge pressure 14, cardiac  index 3.4 L/min/m, PVR 6 Woods units 2015 right heart catheterization index 2.4 L/min/m and PVR 9.5 Wood units.  Records from [facility]: 1. PFT 08/12/12: TLC 108% pred, FVC 105% pred, FEV1 114% pred, FEV1/FVC 73%, FEF25-75% 59% pred, DLCO 48% pred. 2. TTE 09/26/12: LVEF 60-65%, grade 1 LVDD. Mild AR. Trivial MR. Mild biatrial enlargement. RV mildly enlarged. RVSP estimated at 44 mmHg. IVC normal in size with normal respiratory collapse. 3. CT chest 12/12/12: Bilateral irregular pulmonary nodules are all stable. Interstitial thickening most evident at the lung bases. Areas of coarse reticular scarring mostly in anterior upper l9obes and at apices are stable. Borderline mediastinal adenopathy.  4. TTE 05/05/13: LVEF > 55%, grade 1 LVDD, septal flattening. Severely enlarged RV, moderate dysfunction, RA moderately enlarged, IVC normal in size with abnormal respiratory collapse. Moderate TR, TRjet 4.1 m/s, RVSP 75 mmHg. 5. RHC 05/29/13: RA 13, RV 68/15, PA 72/28 (mean 45), PCWP 7, CO 3.5, CI 2.4, PVR 9.5 WU. No significant response to nitric oxide. 6. CT chest 02/23/15 [facility]): 1. Overall the appearance the chest is very similar to the prior examination, with slight progression of disease, as detailed above. Given the mid to upper lung predominance of the nodules and apparent perilymphatic distribution, these findings, in association with some chronic mediastinal and hilar lymphadenopathy is most favored to represent sarcoidosis. Clinical correlation is recommended. No imaging findings to suggest interstitial lung disease at this Time. Air trapping, indicative of small airways disease. Atherosclerosis, including three-vessel coronary artery disease. Assessment for potential risk factor modification, dietary therapy or pharmacologic therapy may be warranted, if clinically indicated.       Assessment & Plan:   PAH (pulmonary artery hypertension) ([place]) - Plan: SLP modified barium swallow, 6 minute walk, Pulmonary  function test, AMB referral to pulmonary rehabilitation  ILD (interstitial lung disease) ([place]) - Plan: SLP modified barium swallow, 6 minute walk, Pulmonary function test, AMB referral to pulmonary rehabilitation  Dysphagia, unspecified type - Plan: SLP modified barium swallow  Discussion: This is a very pleasant 82 year old female with a past medical history significant for scleroderma and pulmonary hypertension who comes to my clinic today for evaluation of worsening shortness of breath and chronic respiratory failure with hypoxemia.  I have the benefit of an outstanding work-up which was performed already by [facility] earlier this year to guide me today.  Specifically she has worsening shortness of breath and hypoxemia and a right heart catheterization earlier this year showed that her pulmonary vascular parameters is actually improved while a CT scan of her chest shows worsening pulmonary parenchymal disease consistent with her underlying autoimmune condition.  In addition to this there is evidence of aspiration in the left lower lobe which goes along with the dysphasia she has been experiencing.  Moving forward we need to treat the pulmonary parenchymal disease as follows: We need to make sure that she is not aspirating anymore and she likely needs to have an EGD with esophageal dilation. We need to start immunosuppression with mycophenolate and possibly anti-fibrotic therapy with Ofev.  I talked to her about this today but I also advised that I would not start any treatment until I have spoken with her rheumatologist.  Plan: Interstitial lung disease due to scleroderma: I am going to get records from your rheumatologist I will speak with your rheumatologist I will check a lung function test I will check a 6-minute walk test You are likely going to need to be treated  with a drug called mycophenolate, I will not start this however until I have spoken with your rheumatologist and with  you at length In the future you may need to be treated with a drug called Ofev  Chronic respiratory failure with hypoxemia: Continue 2 L of oxygen continuously 6-minute walk test  Dysphagia: Barium swallow test Will need to be followed up with Dr. Benson Norway  Pulmonary hypertension: Continue ambrisentan Continue tadalafil Continue torsemide next line continue follow-up with the [facility] pulmonary vascular clinic  I will see you back in 2 to 3 weeks or sooner if needed  > 50% of this 60 minute visit spent face to face   Current Outpatient Medications:  .  acetaminophen (TYLENOL) 500 MG tablet, Take 1,000 mg by mouth every morning., Disp: , Rfl:  .  allopurinol (ZYLOPRIM) 100 MG tablet, Take 100 mg by mouth daily., Disp: , Rfl: 0 .  ambrisentan (LETAIRIS) 10 MG tablet, Take 10 mg by mouth every morning. , Disp: , Rfl:  .  amLODipine (NORVASC) 5 MG tablet, Take 5 mg by mouth every morning., Disp: , Rfl:  .  Cholecalciferol (VITAMIN D3) 2000 units capsule, Take 2,000 Units by mouth daily., Disp: , Rfl:  .  denosumab (PROLIA) 60 MG/ML SOSY injection, Inject 60 mg into the skin every 6 (six) months., Disp: , Rfl:  .  fluticasone (FLONASE) 50 MCG/ACT nasal spray, SPRAY 1 SPRAY INTO EACH NOSTRIL EVERY DAY, Disp: , Rfl: 1 .  folic acid (FOLVITE) 111 MCG tablet, Take 400 mcg by mouth every evening. , Disp: , Rfl:  .  levothyroxine (SYNTHROID, LEVOTHROID) 50 MCG tablet, Take 50 mcg by mouth every morning., Disp: , Rfl:  .  loperamide (IMODIUM A-D) 2 MG tablet, Take 2 mg by mouth 4 (four) times daily as needed for diarrhea or loose stools., Disp: , Rfl:  .  Melatonin 10 MG TABS, Take 1 tablet by mouth 3 times/day as needed-between meals & bedtime (sleep). , Disp: , Rfl:  .  omeprazole (PRILOSEC) 20 MG capsule, Take 20 mg by mouth daily., Disp: , Rfl:  .  OXYGEN, Inhale 2-4 L into the lungs at bedtime. 4 L with exertion 2 L at night, Disp: , Rfl:  .  Tadalafil, PAH, (ADCIRCA) 20 MG TABS, Take  40 mg by mouth every evening. , Disp: , Rfl:  .  torsemide (DEMADEX) 20 MG tablet, Take 10 mg by mouth See admin instructions. Takes mon and fri only, Disp: , Rfl:

## 2018-02-08 NOTE — Patient Instructions (Signed)
Interstitial lung disease due to scleroderma: I am going to get records from your rheumatologist I will speak with your rheumatologist I will check a lung function test I will check a 6-minute walk test You are likely going to need to be treated with a drug called mycophenolate, I will not start this however until I have spoken with your rheumatologist and with you at length In the future you may need to be treated with a drug called Ofev  Chronic respiratory failure with hypoxemia: Continue 2 L of oxygen continuously 6-minute walk test  Dysphagia: Barium swallow test Will need to be followed up with Dr. Benson Norway  Pulmonary hypertension: Continue ambrisentan Continue tadalafil Continue torsemide next line continue follow-up with the [facility] pulmonary vascular clinic  I will see you back in 2 to 3 weeks or sooner if needed

## 2018-02-11 ENCOUNTER — Ambulatory Visit (INDEPENDENT_AMBULATORY_CARE_PROVIDER_SITE_OTHER): Payer: Medicare Other | Admitting: Pulmonary Disease

## 2018-02-11 DIAGNOSIS — J849 Interstitial pulmonary disease, unspecified: Secondary | ICD-10-CM

## 2018-02-11 DIAGNOSIS — I2721 Secondary pulmonary arterial hypertension: Secondary | ICD-10-CM | POA: Diagnosis not present

## 2018-02-11 LAB — PULMONARY FUNCTION TEST
DL/VA % pred: 58 %
DL/VA: 2.11 ml/min/mmHg/L
DLCO COR: 6.38 ml/min/mmHg
DLCO cor % pred: 45 %
DLCO unc % pred: 42 %
DLCO unc: 6.07 ml/min/mmHg
FEF 25-75 PRE: 0.68 L/s
FEF 25-75 Post: 0.69 L/sec
FEF2575-%Change-Post: 1 %
FEF2575-%PRED-PRE: 68 %
FEF2575-%Pred-Post: 69 %
FEV1-%Change-Post: -3 %
FEV1-%Pred-Post: 85 %
FEV1-%Pred-Pre: 87 %
FEV1-PRE: 1.11 L
FEV1-Post: 1.08 L
FEV1FVC-%CHANGE-POST: 0 %
FEV1FVC-%Pred-Pre: 94 %
FEV6-%CHANGE-POST: -2 %
FEV6-%PRED-POST: 95 %
FEV6-%PRED-PRE: 98 %
FEV6-PRE: 1.6 L
FEV6-Post: 1.56 L
FEV6FVC-%PRED-PRE: 107 %
FEV6FVC-%Pred-Post: 107 %
FVC-%Change-Post: -2 %
FVC-%PRED-POST: 89 %
FVC-%Pred-Pre: 91 %
FVC-Post: 1.56 L
FVC-Pre: 1.6 L
POST FEV6/FVC RATIO: 100 %
Post FEV1/FVC ratio: 69 %
Pre FEV1/FVC ratio: 70 %
Pre FEV6/FVC Ratio: 100 %
RV % pred: 243 %
RV: 4.91 L
TLC % PRED: 175 %
TLC: 6.88 L

## 2018-02-11 NOTE — Progress Notes (Signed)
PFT completed today.

## 2018-02-13 ENCOUNTER — Telehealth (HOSPITAL_COMMUNITY): Payer: Self-pay

## 2018-02-13 NOTE — Telephone Encounter (Signed)
Called and spoke with pt in regards to Pulmonary Rehab - pt stated she has participated in the [facility] program and would like to do maintenance instead.  Sent inbasket requesting order to be placed under Maintenance. Closed this order.

## 2018-02-14 NOTE — Addendum Note (Signed)
Addended by: Dolores Lory on: 02/14/2018 11:01 AM   Modules accepted: Orders

## 2018-02-15 ENCOUNTER — Inpatient Hospital Stay (HOSPITAL_COMMUNITY): Admission: RE | Admit: 2018-02-15 | Payer: Medicare Other | Source: Ambulatory Visit

## 2018-02-15 ENCOUNTER — Ambulatory Visit (HOSPITAL_COMMUNITY)
Admission: RE | Admit: 2018-02-15 | Discharge: 2018-02-15 | Disposition: A | Discharge status: Home / Self Care | Payer: Medicare Other | Source: Ambulatory Visit | Attending: Pulmonary Disease | Admitting: Pulmonary Disease

## 2018-02-15 DIAGNOSIS — J849 Interstitial pulmonary disease, unspecified: Secondary | ICD-10-CM

## 2018-02-15 DIAGNOSIS — I2721 Secondary pulmonary arterial hypertension: Secondary | ICD-10-CM | POA: Diagnosis not present

## 2018-02-15 DIAGNOSIS — R1310 Dysphagia, unspecified: Secondary | ICD-10-CM

## 2018-02-15 NOTE — Progress Notes (Signed)
Modified Barium Swallow Progress Note  Patient Details  Name: JERIANNE ANSELMO MRN: [id number] Date of Birth: 10-30-1935  Today's Date: 02/15/2018  Modified Barium Swallow completed.  Full report located under Chart Review in the Imaging Section.  Brief recommendations include the following:  Clinical Impression  Pt exhibited intact oral and pharyngeal swallow abilities across trials of thin (cup and straw), solid and multi-consistency (pill and thin) with suspected primary esophageal dysphagia. Transient and flash penetration with straw and pill/thin trial present and within normal limits. Anterior hyolaryngeal elevation, epiglottic defelction and laryngeal vestibule closure adequate. Esophageal observation revealed frank stasis mid to lower esophagus observed at end of study and remained- MBS does not diagnose below the level of the UES. Educated pt on esophageal precautions to utilize to minimize symptoms and consider GI consult for assessment of esophageal function. Continue regular, thin using caution with meat, bread and dry foods with esophageal strategies.     Swallow Evaluation Recommendations   Recommended Consults: Consider GI evaluation   SLP Diet Recommendations: Regular solids;Thin liquid   Liquid Administration via: Cup;Straw   Medication Administration: Whole meds with liquid   Supervision: Patient able to self feed   Compensations: Slow rate;Small sips/bites   Postural Changes: Seated upright at 90 degrees;Remain semi-upright after after feeds/meals (Comment)   Oral Care Recommendations: Oral care BID        Houston Siren 02/15/2018,3:01 PM   [name] Sneads.Ed Risk analyst [phone] Office [phone]

## 2018-02-27 ENCOUNTER — Telehealth: Payer: Self-pay | Admitting: Pulmonary Disease

## 2018-02-27 NOTE — Telephone Encounter (Signed)
I discussed her situation Dr. Dossie Der her rheumatologist today. She agrees that we need to treat her ILD.    Triage: Please start process for mycophenolate 561m po bid x 1 week then 1gm bid, will need Rx.  Reason: ILD

## 2018-03-07 ENCOUNTER — Encounter (HOSPITAL_COMMUNITY)
Admission: RE | Admit: 2018-03-07 | Discharge: 2018-03-07 | Disposition: A | Discharge status: Home / Self Care | Payer: Self-pay | Source: Ambulatory Visit | Attending: Pulmonary Disease | Admitting: Pulmonary Disease

## 2018-03-07 DIAGNOSIS — I2721 Secondary pulmonary arterial hypertension: Secondary | ICD-10-CM | POA: Insufficient documentation

## 2018-03-07 DIAGNOSIS — J849 Interstitial pulmonary disease, unspecified: Secondary | ICD-10-CM | POA: Insufficient documentation

## 2018-03-12 ENCOUNTER — Encounter (HOSPITAL_COMMUNITY)
Admission: RE | Admit: 2018-03-12 | Discharge: 2018-03-12 | Disposition: A | Discharge status: Home / Self Care | Payer: Self-pay | Source: Ambulatory Visit | Attending: Pulmonary Disease | Admitting: Pulmonary Disease

## 2018-03-14 ENCOUNTER — Ambulatory Visit: Payer: Medicare Other | Admitting: Pulmonary Disease

## 2018-03-14 ENCOUNTER — Encounter (HOSPITAL_COMMUNITY): Payer: Self-pay

## 2018-03-14 ENCOUNTER — Encounter: Payer: Self-pay | Admitting: Pulmonary Disease

## 2018-03-14 ENCOUNTER — Ambulatory Visit (INDEPENDENT_AMBULATORY_CARE_PROVIDER_SITE_OTHER): Payer: Medicare Other | Admitting: *Deleted

## 2018-03-14 VITALS — BP 128/60 | Wt 104.4 lb

## 2018-03-14 DIAGNOSIS — R1310 Dysphagia, unspecified: Secondary | ICD-10-CM

## 2018-03-14 DIAGNOSIS — J849 Interstitial pulmonary disease, unspecified: Secondary | ICD-10-CM

## 2018-03-14 DIAGNOSIS — I2721 Secondary pulmonary arterial hypertension: Secondary | ICD-10-CM

## 2018-03-14 DIAGNOSIS — Z5181 Encounter for therapeutic drug level monitoring: Secondary | ICD-10-CM

## 2018-03-14 LAB — CBC WITH DIFFERENTIAL/PLATELET
BASOS ABS: 0.1 10*3/uL (ref 0.0–0.1)
Basophils Relative: 0.7 % (ref 0.0–3.0)
EOS PCT: 0.7 % (ref 0.0–5.0)
Eosinophils Absolute: 0.1 10*3/uL (ref 0.0–0.7)
HCT: 34.5 % — ABNORMAL LOW (ref 36.0–46.0)
Hemoglobin: 11.3 g/dL — ABNORMAL LOW (ref 12.0–15.0)
LYMPHS ABS: 0.9 10*3/uL (ref 0.7–4.0)
Lymphocytes Relative: 8.1 % — ABNORMAL LOW (ref 12.0–46.0)
MCHC: 32.8 g/dL (ref 30.0–36.0)
MCV: 91.2 fl (ref 78.0–100.0)
Monocytes Absolute: 0.6 10*3/uL (ref 0.1–1.0)
Monocytes Relative: 5.7 % (ref 3.0–12.0)
NEUTROS PCT: 84.8 % — AB (ref 43.0–77.0)
Neutro Abs: 9.2 10*3/uL — ABNORMAL HIGH (ref 1.4–7.7)
Platelets: 351 10*3/uL (ref 150.0–400.0)
RBC: 3.78 Mil/uL — AB (ref 3.87–5.11)
RDW: 15.4 % (ref 11.5–15.5)
WBC: 10.8 10*3/uL — ABNORMAL HIGH (ref 4.0–10.5)

## 2018-03-14 LAB — COMPREHENSIVE METABOLIC PANEL
ALK PHOS: 68 U/L (ref 39–117)
ALT: 9 U/L (ref 0–35)
AST: 14 U/L (ref 0–37)
Albumin: 4.3 g/dL (ref 3.5–5.2)
BUN: 35 mg/dL — AB (ref 6–23)
CO2: 24 mEq/L (ref 19–32)
Calcium: 9.7 mg/dL (ref 8.4–10.5)
Chloride: 107 mEq/L (ref 96–112)
Creatinine, Ser: 1.4 mg/dL — ABNORMAL HIGH (ref 0.40–1.20)
GFR: 38.39 mL/min — ABNORMAL LOW (ref >60.00)
GLUCOSE: 117 mg/dL — AB (ref 70–99)
POTASSIUM: 4.3 meq/L (ref 3.5–5.1)
Sodium: 143 mEq/L (ref 135–145)
TOTAL PROTEIN: 7.5 g/dL (ref 6.0–8.3)
Total Bilirubin: 0.4 mg/dL (ref 0.2–1.2)

## 2018-03-14 MED ORDER — MYCOPHENOLATE MOFETIL 500 MG PO TABS: 1000.0000 mg | ORAL_TABLET | Freq: Two times a day (BID) | ORAL | 5 refills | Status: DC

## 2018-03-14 NOTE — Patient Instructions (Signed)
Diffuse parenchymal lung disease due to scleroderma: Start taking mycophenolate 500 mg twice a day for a week, then 1 g twice a day We will plan on using this medicine for 12-18 months This medicine can cause diarrhea so watch out for that We will need to check your blood count on a monthly basis because this can sometimes suppress your white blood cells Continue taking prednisone at 7.5 mg daily We will talk about starting nintedanib in early 2020  Chronic respiratory failure with hypoxemia: Keep using 2 L of oxygen at rest, 4 L with exertion  Esophageal dysphagia: Follow-up with Dr. Almyra Free  Pulmonary hypertension: Continue tadalafil and ambrisentan and torsemide as you are doing  We will see you back in 6 weeks or sooner if needed  > 50% of this 30 min visit spent face to face

## 2018-03-14 NOTE — Progress Notes (Signed)
SIX MIN WALK 03/14/2018 09/22/2013 01/16/2013  Medications Allopurinol, Letairis, Vitamin D, Synthroid, Prilosec, Prednisone   -  Supplimental Oxygen during Test? (L/min) Yes - No  O2 Flow Rate 4 - -  Type Continuous - -  Laps 1 - -  Partial Lap (in Meters) 20 - -  Baseline BP (sitting) 110/60 - -  Baseline Heartrate 82 - -  Baseline Dyspnea (Borg Scale) 4 - -  Baseline Fatigue (Borg Scale) 2 - -  Baseline SPO2 99 - -  BP (sitting) 120/68 - -  Heartrate 60 - -  Dyspnea (Borg Scale) 7 - -  Fatigue (Borg Scale) 2 - -  SPO2 93 - -  BP (sitting) 116/62 - -  Heartrate 86 - -  SPO2 95 - -  Stopped or Paused before Six Minutes Yes - -  Other Symptoms at end of Exercise Pt became very short of breath and requested to stop. She had 3:02 left on the timer.  - -  Distance Completed 68 - -     Pt completed 4 - 17 meter laps equalling 68 meters all together. Based on the measurements from the [place] office, she completed one whole lap (48 meters) with 20 additional meters.

## 2018-03-14 NOTE — Addendum Note (Signed)
Addended by: Raliegh Ip on: 03/14/2018 11:20 AM   Modules accepted: Orders

## 2018-03-14 NOTE — Progress Notes (Signed)
Synopsis: Referred in 01/2018 for Pulmonary hypertension and diffuse parenchymal lung disease in the setting of underlying scleroderma.  Subjective:   PATIENT ID: Meghan Wise GENDER: female DOB: [birth year], MRN: [id number]   HPI  Chief Complaint  Patient presents with  . Follow-up    PFT, 6MW, increased SOB -just started back prednisone last week   Dwan continues to struggle with shortness of breath.  She has not had problems with cough recently and her weight has been stable.  She is using her oxygen at 2 L at rest and 4 L with exertion.  She has not had weight gain or new leg swelling.  No recent episodes of pneumonia or bronchitis.  She is here today to follow-up the results of her swallow evaluation, lung function test, and 6-minute walk.  Past Medical History:  Diagnosis Date  . Heart failure ([place])   . HTN (hypertension)   . Hyperlipidemia   . Hypothyroidism   . Kidney disease    Stage IV- Dr. Servando Salina -LOV 02-11-14  . Osteoarthritis   . Osteoporosis   . Pulmonary hypertension ([place])   . Raynaud disease    reactive with cold and stress mostly fingers.  . Scleroderma ([place])    lungs and kidneys  . Shortness of breath dyspnea    with exertion-in Cardiac rehab program at [facility].     Review of Systems  Constitutional: Positive for weight loss. Negative for chills, fever and malaise/fatigue.  HENT: Negative for congestion, ear pain, nosebleeds, sinus pain and sore throat.   Eyes: Negative for photophobia, pain, discharge and redness.  Respiratory: Positive for cough and shortness of breath. Negative for hemoptysis, sputum production and wheezing.   Cardiovascular: Positive for palpitations and leg swelling. Negative for chest pain, orthopnea and PND.  Gastrointestinal: Negative for abdominal pain, constipation, diarrhea, nausea and vomiting.  Genitourinary: Negative for dysuria, frequency, hematuria and urgency.  Musculoskeletal: Negative for back pain, joint pain,  myalgias and neck pain.  Skin: Negative for itching and rash.  Neurological: Negative for tingling, tremors, sensory change, speech change, focal weakness, seizures, weakness and headaches.  Endo/Heme/Allergies: Does not bruise/bleed easily.  Psychiatric/Behavioral: Negative for depression, memory loss, substance abuse and suicidal ideas. The patient is not nervous/anxious.       Objective:  Physical Exam   Vitals:   03/14/18 1038  BP: 128/60  SpO2: 98%  Weight: 104 lb 6.4 oz (47.4 kg)    2L [place]  Gen: chronically ill appearing HENT: OP with telangiectasias, TM's clear, neck supple PULM: surprisingly clear lungs B, normal percussion CV: RRR, no mgr, trace edema GI: BS+, soft, nontender Derm: cyanotic digits, no rash Psyche: normal mood and affect    CBC    Component Value Date/Time   WBC 12.2 (H) 01/16/2017 0905   RBC 3.97 01/16/2017 0905   HGB 11.9 (L) 01/16/2017 0905   HCT 36.3 01/16/2017 0905   PLT 400 01/16/2017 0905   MCV 91.4 01/16/2017 0905   MCH 30.0 01/16/2017 0905   MCHC 32.8 01/16/2017 0905   RDW 15.6 (H) 01/16/2017 0905   LYMPHSABS 1.2 01/16/2017 0905   MONOABS 1.1 (H) 01/16/2017 0905   EOSABS 0.3 01/16/2017 0905   BASOSABS 0.0 01/16/2017 0905     Chest imaging: CT chest 12/12/12: Bilateral irregular pulmonary nodules are all stable. Interstitial thickening most evident at the lung bases. Areas of coarse reticular scarring mostly in anterior upper l9obes and at apices are stable. Borderline mediastinal adenopathy. CT chest 02/23/15 [facility]):  1. Overall the appearance the chest is very similar to the prior examination, with slight progression of disease, as detailed above. Given the mid to upper lung predominance of the nodules and apparent perilymphatic distribution, these findings, in association with some chronic mediastinal and hilar lymphadenopathy is most favored to represent sarcoidosis. Clinical correlation is recommended. No imaging findings to  suggest interstitial lung disease at this Time. Air trapping, indicative of small airways disease. Atherosclerosis, including three-vessel coronary artery disease. Assessment for potential risk factor modification, dietary therapy or pharmacologic therapy may be warranted, if clinically indicated. January 11, 2018 CT chest from [facility]: Basilar predominant groundglass and septal thickening with scattered irregular pulmonary nodules stable to minimally progressed compared to the previous study have been present over several years with only mild progression, favored to be secondary to patient's underlying connective tissue disease stable hilar mediastinal adenopathy, minimal new left lower lobe groundglass opacities likely represent superimposed aspiration  PFT: PFT 08/12/12: TLC 108% pred, FVC 105% pred, FEV1 114% pred, FEV1/FVC 73%, FEF25-75% 59% pred, DLCO 48% pred. October 2019 forced vital capacity 1.60 L 91% predicted, total lung capacity 6.88 L 175% predicted, residual volume to 43% predicted, DLCO 6.1 mL 42% predicted  Labs:  Path:  6 Min walk 02/2018> could not complete, walked 3 minutes, made it 68 m total, O2 saturation was 93% nadir on 4L [place]  Echo: TTE 09/26/12: LVEF 60-65%, grade 1 LVDD. Mild AR. Trivial MR. Mild biatrial enlargement. RV mildly enlarged. RVSP estimated at 44 mmHg. IVC normal in size with normal respiratory collapse. TTE 05/05/13: LVEF > 55%, grade 1 LVDD, septal flattening. Severely enlarged RV, moderate dysfunction, RA moderately enlarged, IVC normal in size with abnormal respiratory collapse. Moderate TR, TRjet 4.1 m/s, RVSP 75 mmHg. February 2019 echocardiogram from [facility] normal LV systolic function, normal right ventricular systolic function, no valvular stenosis, RVSP 57 mmHg  Other imaging: 01/2018 modified barium swallow> no difficulty with swallowing, suspect primary esophageal dysphagia  Heart Catheterization: January 11, 2018 right heart  catheterization from [facility] right atrial 8, PA pressure 72/32 mean 42 wedge pressure 14, cardiac index 3.4 L/min/m, PVR 6 [place] units RHC 05/29/13: RA 13, RV 68/15, PA 72/28 (mean 45), PCWP 7, CO 3.5, CI 2.4, PVR 9.5 WU. No significant response to nitric oxide.       Assessment & Plan:   Therapeutic drug monitoring - Plan: Comprehensive metabolic panel, CBC with Differential/Platelet  PAH (pulmonary artery hypertension) (HCC)  ILD (interstitial lung disease) ([place])  Dysphagia, unspecified type  Discussion: Lexys has worsening hypoxemia and shortness of breath because of worsening diffuse parenchymal lung disease in the setting of underlying scleroderma.  There may also be a component of aspiration but we did not prove that with a modified barium swallow.  I worry about esophageal pathology so she is going to see her gastroenterologist Dr. Benson Norway next week to discuss this.  We need to more effectively immunosuppressed her to Prevent this problem from worsening and hopefully reverse it.  We also need to consider using nintedanib as this drug has been shown to slow the progression of scleroderma associated diffuse parenchymal lung disease.  Because of her advanced age, multiple medicines, and overall frail state I would prefer to start these medicines in sequence rather than all at one time.  Plan: Diffuse parenchymal lung disease due to scleroderma: Start taking mycophenolate 500 mg twice a day for a week, then 1 g twice a day We will plan on using this medicine  for 12-18 months This medicine can cause diarrhea so watch out for that We will need to check your blood count on a monthly basis because this can sometimes suppress your white blood cells Continue taking prednisone at 7.5 mg daily We will talk about starting nintedanib in early 2020  Chronic respiratory failure with hypoxemia: Keep using 2 L of oxygen at rest, 4 L with exertion  Esophageal dysphagia: Follow-up with Dr.  Almyra Free  Pulmonary hypertension: Continue tadalafil and ambrisentan and torsemide as you are doing  We will see you back in 6 weeks or sooner if needed  > 50% of this 30 min visit spent face to face     Current Outpatient Medications:  .  acetaminophen (TYLENOL) 500 MG tablet, Take 1,000 mg by mouth every morning., Disp: , Rfl:  .  allopurinol (ZYLOPRIM) 100 MG tablet, Take 100 mg by mouth daily., Disp: , Rfl: 0 .  ambrisentan (LETAIRIS) 10 MG tablet, Take 10 mg by mouth every morning. , Disp: , Rfl:  .  amLODipine (NORVASC) 5 MG tablet, Take 5 mg by mouth every morning., Disp: , Rfl:  .  Cholecalciferol (VITAMIN D3) 2000 units capsule, Take 2,000 Units by mouth daily., Disp: , Rfl:  .  denosumab (PROLIA) 60 MG/ML SOSY injection, Inject 60 mg into the skin every 6 (six) months., Disp: , Rfl:  .  fluticasone (FLONASE) 50 MCG/ACT nasal spray, SPRAY 1 SPRAY INTO EACH NOSTRIL EVERY DAY, Disp: , Rfl: 1 .  folic acid (FOLVITE) 259 MCG tablet, Take 400 mcg by mouth every evening. , Disp: , Rfl:  .  levothyroxine (SYNTHROID, LEVOTHROID) 50 MCG tablet, Take 50 mcg by mouth every morning., Disp: , Rfl:  .  loperamide (IMODIUM A-D) 2 MG tablet, Take 2 mg by mouth 4 (four) times daily as needed for diarrhea or loose stools., Disp: , Rfl:  .  Melatonin 10 MG TABS, Take 1 tablet by mouth 3 times/day as needed-between meals & bedtime (sleep). , Disp: , Rfl:  .  omeprazole (PRILOSEC) 20 MG capsule, Take 20 mg by mouth daily., Disp: , Rfl:  .  OXYGEN, Inhale 2-4 L into the lungs at bedtime. 4 L with exertion 2 L at night, Disp: , Rfl:  .  predniSONE (DELTASONE) 5 MG tablet, TAKE 2 TABLETS BY MOUTH ONCE A DAY FOR ONE WEEK AND THEN 1.5 TABLET DAILY ORALLY 30 DAY(S), Disp: , Rfl: 1 .  Tadalafil, PAH, (ADCIRCA) 20 MG TABS, Take 40 mg by mouth every evening. , Disp: , Rfl:  .  torsemide (DEMADEX) 20 MG tablet, Take 10 mg by mouth See admin instructions. Takes mon and fri only, Disp: , Rfl:  .  mycophenolate  (CELLCEPT) 500 MG tablet, Take 2 tablets (1,000 mg total) by mouth 2 (two) times daily. Take 1 tab twice a day for 7 days, then increase to 2 tabs twice a day., Disp: 60 tablet, Rfl: 5

## 2018-03-19 ENCOUNTER — Encounter (HOSPITAL_COMMUNITY)
Admission: RE | Admit: 2018-03-19 | Discharge: 2018-03-19 | Disposition: A | Discharge status: Home / Self Care | Payer: Self-pay | Source: Ambulatory Visit | Attending: Pulmonary Disease | Admitting: Pulmonary Disease

## 2018-03-20 NOTE — Telephone Encounter (Signed)
We don't typically hear that as a common side effect. If no associated worsening shortness of breath, fever, or mucus production then I recommend taking over the counter Delsym bid prn cough.

## 2018-03-26 ENCOUNTER — Encounter (HOSPITAL_COMMUNITY): Admission: RE | Admit: 2018-03-26 | Payer: Self-pay | Source: Ambulatory Visit

## 2018-03-26 DIAGNOSIS — I2721 Secondary pulmonary arterial hypertension: Secondary | ICD-10-CM | POA: Insufficient documentation

## 2018-03-26 DIAGNOSIS — J849 Interstitial pulmonary disease, unspecified: Secondary | ICD-10-CM | POA: Insufficient documentation

## 2018-03-28 ENCOUNTER — Encounter (HOSPITAL_COMMUNITY)
Admission: RE | Admit: 2018-03-28 | Discharge: 2018-03-28 | Disposition: A | Discharge status: Home / Self Care | Payer: Self-pay | Source: Ambulatory Visit | Attending: Pulmonary Disease | Admitting: Pulmonary Disease

## 2018-03-28 NOTE — Progress Notes (Signed)
Meghan Wise started the Pulmonary Maintenance program 03/07/18. A 6 walk test was performed that day. She used her pulsed oxygen on 4L, her saturations were 88-92%. She tolerated exercise well without c/o other than feeling out of shape.

## 2018-03-29 ENCOUNTER — Encounter (HOSPITAL_COMMUNITY): Payer: Self-pay | Admitting: Emergency Medicine

## 2018-03-29 ENCOUNTER — Emergency Department (HOSPITAL_COMMUNITY): Payer: Medicare Other

## 2018-03-29 ENCOUNTER — Emergency Department (HOSPITAL_COMMUNITY)
Admission: EM | Admit: 2018-03-29 | Discharge: 2018-03-29 | Disposition: A | Discharge status: Home / Self Care | Payer: Medicare Other | Attending: Emergency Medicine | Admitting: Emergency Medicine

## 2018-03-29 ENCOUNTER — Other Ambulatory Visit: Payer: Self-pay

## 2018-03-29 DIAGNOSIS — J40 Bronchitis, not specified as acute or chronic: Secondary | ICD-10-CM | POA: Diagnosis not present

## 2018-03-29 DIAGNOSIS — Z87891 Personal history of nicotine dependence: Secondary | ICD-10-CM | POA: Insufficient documentation

## 2018-03-29 DIAGNOSIS — Z79899 Other long term (current) drug therapy: Secondary | ICD-10-CM | POA: Diagnosis not present

## 2018-03-29 DIAGNOSIS — I5032 Chronic diastolic (congestive) heart failure: Secondary | ICD-10-CM | POA: Insufficient documentation

## 2018-03-29 DIAGNOSIS — R05 Cough: Secondary | ICD-10-CM | POA: Diagnosis present

## 2018-03-29 DIAGNOSIS — I130 Hypertensive heart and chronic kidney disease with heart failure and stage 1 through stage 4 chronic kidney disease, or unspecified chronic kidney disease: Secondary | ICD-10-CM | POA: Insufficient documentation

## 2018-03-29 DIAGNOSIS — N184 Chronic kidney disease, stage 4 (severe): Secondary | ICD-10-CM | POA: Diagnosis not present

## 2018-03-29 DIAGNOSIS — E039 Hypothyroidism, unspecified: Secondary | ICD-10-CM | POA: Insufficient documentation

## 2018-03-29 DIAGNOSIS — R197 Diarrhea, unspecified: Secondary | ICD-10-CM

## 2018-03-29 LAB — COMPREHENSIVE METABOLIC PANEL
ALK PHOS: 130 U/L — AB (ref 38–126)
ALT: 41 U/L (ref 0–44)
AST: 29 U/L (ref 15–41)
Albumin: 4 g/dL (ref 3.5–5.0)
Anion gap: 12 (ref 5–15)
BUN: 43 mg/dL — ABNORMAL HIGH (ref 8–23)
CALCIUM: 8.7 mg/dL — AB (ref 8.9–10.3)
CHLORIDE: 111 mmol/L (ref 98–111)
CO2: 17 mmol/L — ABNORMAL LOW (ref 22–32)
CREATININE: 1.52 mg/dL — AB (ref 0.44–1.00)
GFR calc non Af Amer: 32 mL/min — ABNORMAL LOW (ref >60)
GFR, EST AFRICAN AMERICAN: 37 mL/min — AB (ref >60)
Glucose, Bld: 112 mg/dL — ABNORMAL HIGH (ref 70–99)
Potassium: 4.1 mmol/L (ref 3.5–5.1)
Sodium: 140 mmol/L (ref 135–145)
Total Bilirubin: 0.6 mg/dL (ref 0.3–1.2)
Total Protein: 7.4 g/dL (ref 6.5–8.1)

## 2018-03-29 LAB — CBC
HCT: 36.4 % (ref 36.0–46.0)
Hemoglobin: 11.2 g/dL — ABNORMAL LOW (ref 12.0–15.0)
MCH: 29.1 pg (ref 26.0–34.0)
MCHC: 30.8 g/dL (ref 30.0–36.0)
MCV: 94.5 fL (ref 80.0–100.0)
NRBC: 0 % (ref 0.0–0.2)
PLATELETS: 250 10*3/uL (ref 150–400)
RBC: 3.85 MIL/uL — ABNORMAL LOW (ref 3.87–5.11)
RDW: 16.6 % — AB (ref 11.5–15.5)
WBC: 7.2 10*3/uL (ref 4.0–10.5)

## 2018-03-29 LAB — LIPASE, BLOOD: LIPASE: 38 U/L (ref 11–51)

## 2018-03-29 MED ORDER — AZITHROMYCIN 250 MG PO TABS: 250.0000 mg | ORAL_TABLET | Freq: Every day | ORAL | 0 refills | Status: DC

## 2018-03-29 MED ORDER — AZITHROMYCIN 250 MG PO TABS
500.0000 mg | ORAL_TABLET | Freq: Once | ORAL | Status: AC
  Administered 2018-03-29: 500 mg via ORAL
  Filled 2018-03-29: qty 2

## 2018-03-29 NOTE — ED Triage Notes (Signed)
Per EMS- patient is from home. Patient c/o NP cough x 2-3 days. Lungs clear. Patient did not want to go to an UC but did consent to the ED.

## 2018-03-29 NOTE — Discharge Instructions (Addendum)
You were evaluated in the emergency department for increased cough over the last few days and 1 day of diarrhea.  Your chest x-ray did not show an obvious pneumonia.  Your blood work looks like you may be mildly dehydrated.  Please continue to drink plenty of fluids.  We are starting you on some antibiotics to take in case this is a bacterial infection.  Please follow-up with your doctors and return if any worsening symptoms

## 2018-03-29 NOTE — ED Notes (Signed)
Triage completed by Elaina Hoops, RN and not Tiana Loft, EMT.

## 2018-03-29 NOTE — ED Triage Notes (Signed)
Patient also c/o in triage that she has began having diarrhea today, 3-4 episodes and took Imodium this AM.

## 2018-03-29 NOTE — ED Provider Notes (Signed)
[facility] DEPT Provider Note   CSN: [id number] Arrival date & time: 03/29/18  1154     History   Chief Complaint Chief Complaint  Patient presents with  . Cough  . Diarrhea    HPI Meghan Wise is a 82 y.o. female.  She is presenting with a nonproductive cough that is been going on for 2 days.  Its associated with some shortness of breath.  She is been trying some Mucinex.  She is chronically on oxygen and is recently been put on steroids for possible sarcoidosis and pulmonary hypertension.  She follows with Dr. Lake Bells from pulmonology.  Today she had 4 episodes of loose bowel movements and took some Imodium.  She has a little bit of abdominal cramping before the diarrhea.  She has had on and off loose bowels.  No fevers or chills no chest pain.  No nausea or vomiting.  No sick contacts no recent travel.  The history is provided by the patient.  Cough  This is a new problem. The current episode started 2 days ago. The problem occurs every few minutes. The problem has not changed since onset.The cough is non-productive. There has been no fever. Associated symptoms include shortness of breath. Pertinent negatives include no chest pain, no chills, no sweats, no headaches, no rhinorrhea, no sore throat and no wheezing. She has tried cough syrup for the symptoms. The treatment provided mild relief. She is not a smoker.  Diarrhea   Associated symptoms include cough. Pertinent negatives include no abdominal pain, no vomiting, no chills, no sweats and no headaches.    Past Medical History:  Diagnosis Date  . Heart failure ([place])   . HTN (hypertension)   . Hyperlipidemia   . Hypothyroidism   . Kidney disease    Stage IV- Dr. Servando Salina -LOV 02-11-14  . Osteoarthritis   . Osteoporosis   . Pulmonary hypertension ([place])   . Raynaud disease    reactive with cold and stress mostly fingers.  . Scleroderma ([place])    lungs and kidneys  . Shortness of breath  dyspnea    with exertion-in Cardiac rehab program at [facility].    Patient Active Problem List   Diagnosis Date Noted  . PNA (pneumonia) 07/22/2016  . Acute on chronic respiratory failure with hypoxia ([place]) 07/21/2016  . Community acquired pneumonia 07/21/2016  . CKD (chronic kidney disease) stage 4, GFR 15-29 ml/min (HCC) 07/21/2016  . Anemia of chronic disease 07/21/2016  . AVM (arteriovenous malformation) of duodenum, acquired 05/07/2014  . Chronic diastolic CHF (congestive heart failure), NYHA class 4 ([place]) 05/28/2012  . HTN (hypertension)   . Raynaud disease   . Hypothyroidism     Past Surgical History:  Procedure Laterality Date  . BREAST BIOPSY Bilateral    x   . CARDIAC CATHETERIZATION     2'15 [facility]  . CHOLECYSTECTOMY    . CHOLECYSTECTOMY, LAPAROSCOPIC    . COLONOSCOPY WITH PROPOFOL N/A 05/07/2014   Procedure: COLONOSCOPY WITH PROPOFOL;  Surgeon: Inda Castle, MD;  Location: [facility] ENDOSCOPY;  Service: Endoscopy;  Laterality: N/A;  . ESOPHAGOGASTRODUODENOSCOPY (EGD) WITH PROPOFOL N/A 05/07/2014   Procedure: ESOPHAGOGASTRODUODENOSCOPY (EGD) WITH PROPOFOL;  Surgeon: Inda Castle, MD;  Location: [facility] ENDOSCOPY;  Service: Endoscopy;  Laterality: N/A;  . HOT HEMOSTASIS N/A 05/07/2014   Procedure: HOT HEMOSTASIS (ARGON PLASMA COAGULATION/BICAP);  Surgeon: Inda Castle, MD;  Location: [facility];  Service: Endoscopy;  Laterality: N/A;  deuodenal bulb  . SHOULDER ARTHROSCOPY W/  ROTATOR CUFF REPAIR Right      OB History   None      Home Medications    Prior to Admission medications   Medication Sig Start Date End Date Taking? Authorizing Provider  acetaminophen (TYLENOL) 500 MG tablet Take 1,000 mg by mouth every morning.    [provider]  allopurinol (ZYLOPRIM) 100 MG tablet Take 100 mg by mouth daily. 11/13/16   [provider]  ambrisentan (LETAIRIS) 10 MG tablet Take 10 mg by mouth every morning.     [provider]  amLODipine (NORVASC) 5  MG tablet Take 5 mg by mouth every morning. 03/15/16   [provider]  Cholecalciferol (VITAMIN D3) 2000 units capsule Take 2,000 Units by mouth daily.    [provider]  denosumab (PROLIA) 60 MG/ML SOSY injection Inject 60 mg into the skin every 6 (six) months.    [provider]  fluticasone (FLONASE) 50 MCG/ACT nasal spray SPRAY 1 SPRAY INTO EACH NOSTRIL EVERY DAY 11/30/17   [provider]  folic acid (FOLVITE) 474 MCG tablet Take 400 mcg by mouth every evening.     [provider]  levothyroxine (SYNTHROID, LEVOTHROID) 50 MCG tablet Take 50 mcg by mouth every morning.    [provider]  loperamide (IMODIUM A-D) 2 MG tablet Take 2 mg by mouth 4 (four) times daily as needed for diarrhea or loose stools.    [provider]  Melatonin 10 MG TABS Take 1 tablet by mouth 3 times/day as needed-between meals & bedtime (sleep).     [provider]  mycophenolate (CELLCEPT) 500 MG tablet Take 2 tablets (1,000 mg total) by mouth 2 (two) times daily. Take 1 tab twice a day for 7 days, then increase to 2 tabs twice a day. 03/14/18   Juanito Doom, MD  omeprazole (PRILOSEC) 20 MG capsule Take 20 mg by mouth daily.    [provider]  OXYGEN Inhale 2-4 L into the lungs at bedtime. 4 L with exertion 2 L at night    [provider]  predniSONE (DELTASONE) 5 MG tablet TAKE 2 TABLETS BY MOUTH ONCE A DAY FOR ONE WEEK AND THEN 1.5 TABLET DAILY ORALLY 30 DAY(S) 03/01/18   [provider]  Tadalafil, PAH, (ADCIRCA) 20 MG TABS Take 40 mg by mouth every evening.  08/27/13   [provider]  torsemide (DEMADEX) 20 MG tablet Take 10 mg by mouth See admin instructions. Takes mon and fri only    [provider]    Family History Family History  Problem Relation Age of Onset  . Heart disease Mother   . Heart disease Father   . Cancer Unknown        husband--esophageal  . Diabetes Son   . Stroke  Daughter   . Breast cancer Neg Hx     Social History Social History   Tobacco Use  . Smoking status: Former Smoker    Packs/day: 1.50    Years: 60.00    Pack years: 90.00    Types: Cigarettes    Last attempt to quit: 04/25/1987    Years since quitting: 30.9  . Smokeless tobacco: Never Used  Substance Use Topics  . Alcohol use: Yes    Alcohol/week: 0.0 standard drinks    Comment: OCCASIONALLY- rare  . Drug use: No     Allergies   Losartan; Ace inhibitors; Sulfa antibiotics; and Codeine   Review of Systems Review of Systems  Constitutional:  Negative for chills and fever.  HENT: Negative for rhinorrhea and sore throat.   Eyes: Negative for visual disturbance.  Respiratory: Positive for cough and shortness of breath. Negative for wheezing.   Cardiovascular: Negative for chest pain.  Gastrointestinal: Positive for diarrhea. Negative for abdominal pain, nausea and vomiting.  Genitourinary: Negative for dysuria.  Musculoskeletal: Negative for neck pain.  Skin: Negative for rash.  Neurological: Negative for headaches.     Physical Exam Updated Vital Signs BP 130/64 (BP Location: Right Arm)   Pulse 66   Temp 98.3 F (36.8 C) (Oral)   Resp 18   Ht _0  (1.473 m)   Wt 48.1 kg   SpO2 96%   BMI 22.15 kg/m   Physical Exam  Constitutional: Vital signs are normal. She appears well-developed. She appears cachectic.  Non-toxic appearance. No distress.  HENT:  Head: Normocephalic and atraumatic.  Eyes: Conjunctivae are normal.  Neck: Neck supple.  Cardiovascular: Normal rate, regular rhythm and normal heart sounds.  No murmur heard. Pulmonary/Chest: Breath sounds normal. Accessory muscle usage present. No respiratory distress.  Abdominal: Soft. There is no tenderness.  Musculoskeletal: She exhibits no edema, tenderness or deformity.  Neurological: She is alert. GCS eye subscore is 4. GCS verbal subscore is 5. GCS motor subscore is 6.  Skin: Skin is warm and dry.  She  has some cyanosis of her fingers.  Psychiatric: She has a normal mood and affect.  Nursing note and vitals reviewed.    ED Treatments / Results  Labs (all labs ordered are listed, but only abnormal results are displayed) Labs Reviewed  COMPREHENSIVE METABOLIC PANEL - Abnormal; Notable for the following components:      Result Value   CO2 17 (*)    Glucose, Bld 112 (*)    BUN 43 (*)    Creatinine, Ser 1.52 (*)    Calcium 8.7 (*)    Alkaline Phosphatase 130 (*)    GFR calc non Af Amer 32 (*)    GFR calc Af Amer 37 (*)    All other components within normal limits  CBC - Abnormal; Notable for the following components:   RBC 3.85 (*)    Hemoglobin 11.2 (*)    RDW 16.6 (*)    All other components within normal limits  LIPASE, BLOOD  URINALYSIS, ROUTINE W REFLEX MICROSCOPIC    EKG EKG Interpretation  Date/Time:  Friday March 29 2018 13:42:09 EST Ventricular Rate:  69 PR Interval:    QRS Duration: 101 QT Interval:  438 QTC Calculation: 470 R Axis:   62 Text Interpretation:  Sinus rhythm RSR' in V1 or V2, right VCD or RVH no change from prior 9/18 Confirmed by Aletta Edouard [phone]) on 03/29/2018 1:48:18 PM   Radiology Dg Chest 2 View  Result Date: 03/29/2018 CLINICAL DATA:  Heart failure. EXAM: CHEST - 2 VIEW COMPARISON:  01/16/2017 FINDINGS: Cardiomediastinal silhouette is normal. Mediastinal contours appear intact. Hilar fullness may represent lymphadenopathy. Calcific atherosclerotic disease of the aorta. There is no evidence of focal airspace consolidation, pleural effusion or pneumothorax. Chronic bronchitic changes. Biapical subpleural scarring. Osseous structures are without acute abnormality. Soft tissues are grossly normal. IMPRESSION: Chronic bronchitic changes and biapical subpleural scarring. Hilar fullness, query lymphadenopathy. Electronically Signed   By: Fidela Salisbury M.D.   On: 03/29/2018 13:36    Procedures Procedures (including critical care  time)  Medications Ordered in ED Medications  azithromycin (ZITHROMAX) tablet 500 mg (has no administration in time range)  Initial Impression / Assessment and Plan / ED Course  I have reviewed the triage vital signs and the nursing notes.  Pertinent labs & imaging results that were available during my care of the patient were reviewed by me and considered in my medical decision making (see chart for details).  Clinical Course as of Mar 29 1526  Fri Mar 29, 2018  1415 Reviewed the patient's lab work and her chest x-ray report with her.  She is comfortable going home although she asked if she can start on some antibiotics as she has had a history of atypical pneumonias before.  I think with her poor pulmonary reserve this would be reasonable approach.  She does not feel like she needs to be admitted at this time.  I encouraged her to try to continue to stay well-hydrated.   [MB]    Clinical Course User Index [MB] Hayden Rasmussen, MD      Final Clinical Impressions(s) / ED Diagnoses   Final diagnoses:  Bronchitis  Diarrhea, unspecified type    ED Discharge Orders         Ordered    azithromycin (ZITHROMAX) 250 MG tablet  Daily     03/29/18 1417           Hayden Rasmussen, MD 03/29/18 1527

## 2018-04-01 ENCOUNTER — Telehealth: Payer: Self-pay | Admitting: Pulmonary Disease

## 2018-04-01 NOTE — Telephone Encounter (Signed)
Attempted to call pt in regards to her mychart message to let her know that we needed her to come in for an OV so we can further address her symptoms but unable to reach her.  Left message for pt to return call and I stated to her in the message that we needed her to come in for an OV.  When pt calls back, please schedule her an OV. Thanks!

## 2018-04-01 NOTE — Telephone Encounter (Signed)
Called and spoke to pt.  Pt is requesting Rx for cough. Pt reports of non prod cough & increased sob. Denied fever, chills or sweats.  Recent ED visit 03/29/18 and dx with bronchitis.  Pt was given zpak. Pt states she has completed zpak with no improvement in sx. Pt has been scheduled for acute visit with [facility] on 04/02/18., Nothing further is needed.

## 2018-04-02 ENCOUNTER — Other Ambulatory Visit: Payer: Self-pay

## 2018-04-02 ENCOUNTER — Emergency Department (HOSPITAL_COMMUNITY): Payer: Medicare Other

## 2018-04-02 ENCOUNTER — Encounter (HOSPITAL_COMMUNITY): Payer: Self-pay

## 2018-04-02 ENCOUNTER — Ambulatory Visit: Payer: Medicare Other | Admitting: Nurse Practitioner

## 2018-04-02 ENCOUNTER — Observation Stay (HOSPITAL_COMMUNITY)
Admission: EM | Admit: 2018-04-02 | Discharge: 2018-04-04 | Disposition: A | Discharge status: Home / Self Care | Payer: Medicare Other | Attending: Internal Medicine | Admitting: Internal Medicine

## 2018-04-02 DIAGNOSIS — I10 Essential (primary) hypertension: Secondary | ICD-10-CM | POA: Diagnosis present

## 2018-04-02 DIAGNOSIS — M1990 Unspecified osteoarthritis, unspecified site: Secondary | ICD-10-CM | POA: Insufficient documentation

## 2018-04-02 DIAGNOSIS — R197 Diarrhea, unspecified: Secondary | ICD-10-CM | POA: Diagnosis not present

## 2018-04-02 DIAGNOSIS — R0602 Shortness of breath: Secondary | ICD-10-CM | POA: Diagnosis not present

## 2018-04-02 DIAGNOSIS — E785 Hyperlipidemia, unspecified: Secondary | ICD-10-CM | POA: Diagnosis not present

## 2018-04-02 DIAGNOSIS — I7300 Raynaud's syndrome without gangrene: Secondary | ICD-10-CM | POA: Diagnosis not present

## 2018-04-02 DIAGNOSIS — J9621 Acute and chronic respiratory failure with hypoxia: Secondary | ICD-10-CM | POA: Diagnosis present

## 2018-04-02 DIAGNOSIS — I5032 Chronic diastolic (congestive) heart failure: Secondary | ICD-10-CM | POA: Insufficient documentation

## 2018-04-02 DIAGNOSIS — I130 Hypertensive heart and chronic kidney disease with heart failure and stage 1 through stage 4 chronic kidney disease, or unspecified chronic kidney disease: Principal | ICD-10-CM | POA: Insufficient documentation

## 2018-04-02 DIAGNOSIS — Z7951 Long term (current) use of inhaled steroids: Secondary | ICD-10-CM | POA: Insufficient documentation

## 2018-04-02 DIAGNOSIS — Z882 Allergy status to sulfonamides status: Secondary | ICD-10-CM | POA: Insufficient documentation

## 2018-04-02 DIAGNOSIS — Z9981 Dependence on supplemental oxygen: Secondary | ICD-10-CM | POA: Insufficient documentation

## 2018-04-02 DIAGNOSIS — M349 Systemic sclerosis, unspecified: Secondary | ICD-10-CM

## 2018-04-02 DIAGNOSIS — J849 Interstitial pulmonary disease, unspecified: Secondary | ICD-10-CM | POA: Insufficient documentation

## 2018-04-02 DIAGNOSIS — N183 Chronic kidney disease, stage 3 (moderate): Secondary | ICD-10-CM | POA: Insufficient documentation

## 2018-04-02 DIAGNOSIS — R0600 Dyspnea, unspecified: Secondary | ICD-10-CM | POA: Diagnosis present

## 2018-04-02 DIAGNOSIS — J204 Acute bronchitis due to parainfluenza virus: Secondary | ICD-10-CM | POA: Diagnosis not present

## 2018-04-02 DIAGNOSIS — Z7952 Long term (current) use of systemic steroids: Secondary | ICD-10-CM | POA: Insufficient documentation

## 2018-04-02 DIAGNOSIS — I2721 Secondary pulmonary arterial hypertension: Secondary | ICD-10-CM | POA: Insufficient documentation

## 2018-04-02 DIAGNOSIS — R0603 Acute respiratory distress: Secondary | ICD-10-CM

## 2018-04-02 DIAGNOSIS — E039 Hypothyroidism, unspecified: Secondary | ICD-10-CM | POA: Insufficient documentation

## 2018-04-02 DIAGNOSIS — Z87891 Personal history of nicotine dependence: Secondary | ICD-10-CM | POA: Diagnosis not present

## 2018-04-02 LAB — INFLUENZA PANEL BY PCR (TYPE A & B)
INFLBPCR: NEGATIVE
Influenza A By PCR: NEGATIVE

## 2018-04-02 LAB — CBC WITH DIFFERENTIAL/PLATELET
Abs Immature Granulocytes: 0.07 10*3/uL (ref 0.00–0.07)
BASOS ABS: 0 10*3/uL (ref 0.0–0.1)
BASOS PCT: 0 %
Eosinophils Absolute: 0 10*3/uL (ref 0.0–0.5)
Eosinophils Relative: 0 %
HCT: 32.2 % — ABNORMAL LOW (ref 36.0–46.0)
Hemoglobin: 10.1 g/dL — ABNORMAL LOW (ref 12.0–15.0)
Immature Granulocytes: 1 %
Lymphocytes Relative: 5 %
Lymphs Abs: 0.5 10*3/uL — ABNORMAL LOW (ref 0.7–4.0)
MCH: 29.7 pg (ref 26.0–34.0)
MCHC: 31.4 g/dL (ref 30.0–36.0)
MCV: 94.7 fL (ref 80.0–100.0)
Monocytes Absolute: 0.3 10*3/uL (ref 0.1–1.0)
Monocytes Relative: 3 %
Neutro Abs: 9.8 10*3/uL — ABNORMAL HIGH (ref 1.7–7.7)
Neutrophils Relative %: 91 %
Platelets: 249 10*3/uL (ref 150–400)
RBC: 3.4 MIL/uL — AB (ref 3.87–5.11)
RDW: 16.7 % — ABNORMAL HIGH (ref 11.5–15.5)
WBC: 10.7 10*3/uL — ABNORMAL HIGH (ref 4.0–10.5)
nRBC: 0 % (ref 0.0–0.2)

## 2018-04-02 LAB — BASIC METABOLIC PANEL
Anion gap: 10 (ref 5–15)
BUN: 45 mg/dL — ABNORMAL HIGH (ref 8–23)
CO2: 19 mmol/L — ABNORMAL LOW (ref 22–32)
Calcium: 9.5 mg/dL (ref 8.9–10.3)
Chloride: 113 mmol/L — ABNORMAL HIGH (ref 98–111)
Creatinine, Ser: 1.58 mg/dL — ABNORMAL HIGH (ref 0.44–1.00)
GFR calc Af Amer: 35 mL/min — ABNORMAL LOW (ref >60)
GFR calc non Af Amer: 31 mL/min — ABNORMAL LOW (ref >60)
Glucose, Bld: 109 mg/dL — ABNORMAL HIGH (ref 70–99)
Potassium: 4.2 mmol/L (ref 3.5–5.1)
Sodium: 142 mmol/L (ref 135–145)

## 2018-04-02 LAB — BRAIN NATRIURETIC PEPTIDE: B NATRIURETIC PEPTIDE 5: 231.9 pg/mL — AB (ref 0.0–100.0)

## 2018-04-02 LAB — I-STAT TROPONIN, ED: Troponin i, poc: 0.02 ng/mL (ref 0.00–0.08)

## 2018-04-02 MED ORDER — HYDROCOD POLST-CPM POLST ER 10-8 MG/5ML PO SUER
5.0000 mL | Freq: Once | ORAL | Status: AC
  Administered 2018-04-02: 5 mL via ORAL
  Filled 2018-04-02: qty 5

## 2018-04-02 MED ORDER — IPRATROPIUM-ALBUTEROL 0.5-2.5 (3) MG/3ML IN SOLN
3.0000 mL | Freq: Once | RESPIRATORY_TRACT | Status: AC
  Administered 2018-04-02: 3 mL via RESPIRATORY_TRACT
  Filled 2018-04-02: qty 3

## 2018-04-02 MED ORDER — DM-GUAIFENESIN ER 30-600 MG PO TB12
1.0000 | ORAL_TABLET | Freq: Two times a day (BID) | ORAL | Status: DC
  Administered 2018-04-02 – 2018-04-04 (×4): 1 via ORAL
  Filled 2018-04-02 (×4): qty 1

## 2018-04-02 MED ORDER — ALBUTEROL SULFATE (2.5 MG/3ML) 0.083% IN NEBU
5.0000 mg | INHALATION_SOLUTION | Freq: Once | RESPIRATORY_TRACT | Status: AC
  Administered 2018-04-02: 5 mg via RESPIRATORY_TRACT
  Filled 2018-04-02: qty 6

## 2018-04-02 MED ORDER — METHYLPREDNISOLONE SODIUM SUCC 125 MG IJ SOLR
125.0000 mg | Freq: Once | INTRAMUSCULAR | Status: AC
  Administered 2018-04-02: 125 mg via INTRAVENOUS
  Filled 2018-04-02: qty 2

## 2018-04-02 NOTE — ED Notes (Signed)
Bed: RESA Expected date:  Expected time:  Means of arrival:  Comments: carelink-vented

## 2018-04-02 NOTE — ED Triage Notes (Signed)
Patient BIB EMS from [facility] with complaints of respiratory distress. Patient reports cough x8 days. Patient was seen at [facility] on 12/6 for the same. Patient reports cough has gotten work, increased work of breathing. Patient has history of pulmonary HTN. Patient lives on Home O2 3L. Patient on 4LNC in triage. Patient lung sounds clear for EMS. Patient is AxOx4 for EMS. 12 Leak EKG for EMS was unremarkable.

## 2018-04-02 NOTE — ED Provider Notes (Signed)
Signed out to me with reports of respiratory distress, cough, and congestion. Patient with history of pulmonary hypertension, distant history of smoking, and there are dermal with lung involvement.  She was treated outpatient with Zithromax and is on 2 L of oxygen 4 L at baseline. Here she has increased dyspnea with increased work of breathing On my exam she continues to have increased work of breathing She was treated here with albuterol and Solu-Medrol. CT was pending CT results with multiple findings consistent with her scleroderma but no evidence of acute or worsening infiltrate Flu test is pending. Plan admission for further evaluation and treatment Vitals:   04/02/18 1700 04/02/18 1800  BP: (!) 148/70 140/77  Pulse: 98 88  Resp: (!) 22 18  Temp:    SpO2: 100% 100%   Discussed with hospitalist for admission and she will see   Pattricia Boss, MD 04/02/18 [phone]

## 2018-04-02 NOTE — ED Notes (Signed)
ED TO INPATIENT HANDOFF REPORT  Name/Age/Gender Meghan Wise 82 y.o. female  Code Status Code Status History    Date Active Date Inactive Code Status Order ID Comments User Context   07/21/2016 2030 07/22/2016 1220 Full Code [id number]  Theodis Blaze, MD Inpatient   05/24/2012 1127 05/28/2012 1946 Full Code [id number]  Elvera Lennox, MD Inpatient    Advance Directive Documentation     Most Recent Value  Type of Advance Directive  Healthcare Power of Attorney, Living will  Pre-existing out of facility DNR order (yellow form or pink MOST form)  -  "MOST" Form in Place?  -      Home/SNF/Other Nursing Home  Chief Complaint resp distress   Level of Care/Admitting Diagnosis ED Disposition    ED Disposition Condition [place]: [facility] [100102]  Level of Care: Telemetry [5]  Admit to tele based on following criteria: Other see comments  Comments: SOB  Diagnosis: SOB (shortness of breath) [774128]  Admitting Physician: Bethena Roys [phone]  Attending Physician: Bethena Roys Nessa.Cuff  PT Class (Do Not Modify): Observation [104]  PT Acc Code (Do Not Modify): Observation [10022]       Medical History Past Medical History:  Diagnosis Date  . Heart failure ([place])   . HTN (hypertension)   . Hyperlipidemia   . Hypothyroidism   . Kidney disease    Stage IV- Dr. Servando Salina -LOV 02-11-14  . Osteoarthritis   . Osteoporosis   . Pulmonary hypertension ([place])   . Raynaud disease    reactive with cold and stress mostly fingers.  . Scleroderma ([place])    lungs and kidneys  . Shortness of breath dyspnea    with exertion-in Cardiac rehab program at [facility].    Allergies Allergies  Allergen Reactions  . Losartan Other (See Comments) and Cough    High calcium count and renal problems  . Ace Inhibitors Other (See Comments) and Cough    Dizziness and coughing   . Sulfa Antibiotics Hives  . Codeine Nausea Only    IV  Location/Drains/Wounds Patient Lines/Drains/Airways Status   Active Line/Drains/Airways    Name:   Placement date:   Placement time:   Site:   Days:   Peripheral IV 04/02/18 Right Antecubital   04/02/18    1305    Antecubital   less than 1          Labs/Imaging Results for orders placed or performed during the hospital encounter of 04/02/18 (from the past 48 hour(s))  Basic metabolic panel     Status: Abnormal   Collection Time: 04/02/18 12:57 PM  Result Value Ref Range   Sodium 142 135 - 145 mmol/L   Potassium 4.2 3.5 - 5.1 mmol/L   Chloride 113 (H) 98 - 111 mmol/L   CO2 19 (L) 22 - 32 mmol/L   Glucose, Bld 109 (H) 70 - 99 mg/dL   BUN 45 (H) 8 - 23 mg/dL   Creatinine, Ser 1.58 (H) 0.44 - 1.00 mg/dL   Calcium 9.5 8.9 - 10.3 mg/dL   GFR calc non Af Amer 31 (L) >60 mL/min   GFR calc Af Amer 35 (L) >60 mL/min   Anion gap 10 5 - 15    Comment: Performed at [facility], [place] [address] [place], [place] [address]  Brain natriuretic peptide     Status: Abnormal   Collection Time: 04/02/18 12:57 PM  Result Value Ref Range  B Natriuretic Peptide 231.9 (H) 0.0 - 100.0 pg/mL    Comment: Performed at [facility], [place] [address], [place], [place] [address]  I-stat troponin, ED     Status: None   Collection Time: 04/02/18  1:02 PM  Result Value Ref Range   Troponin i, poc 0.02 0.00 - 0.08 ng/mL   Comment 3            Comment: Due to the release kinetics of cTnI, a negative result within the first hours of the onset of symptoms does not rule out myocardial infarction with certainty. If myocardial infarction is still suspected, repeat the test at appropriate intervals.   CBC with Differential     Status: Abnormal   Collection Time: 04/02/18  2:15 PM  Result Value Ref Range   WBC 10.7 (H) 4.0 - 10.5 K/uL   RBC 3.40 (L) 3.87 - 5.11 MIL/uL   Hemoglobin 10.1 (L) 12.0 - 15.0 g/dL   HCT 32.2 (L) 36.0 - 46.0 %   MCV 94.7 80.0 - 100.0 fL   MCH 29.7  26.0 - 34.0 pg   MCHC 31.4 30.0 - 36.0 g/dL   RDW 16.7 (H) 11.5 - 15.5 %   Platelets 249 150 - 400 K/uL   nRBC 0.0 0.0 - 0.2 %   Neutrophils Relative % 91 %   Neutro Abs 9.8 (H) 1.7 - 7.7 K/uL   Lymphocytes Relative 5 %   Lymphs Abs 0.5 (L) 0.7 - 4.0 K/uL   Monocytes Relative 3 %   Monocytes Absolute 0.3 0.1 - 1.0 K/uL   Eosinophils Relative 0 %   Eosinophils Absolute 0.0 0.0 - 0.5 K/uL   Basophils Relative 0 %   Basophils Absolute 0.0 0.0 - 0.1 K/uL   Immature Granulocytes 1 %   Abs Immature Granulocytes 0.07 0.00 - 0.07 K/uL    Comment: Performed at [facility], [place] [address] [place], [place] [address]  Influenza panel by PCR (type A & B)     Status: None   Collection Time: 04/02/18  5:52 PM  Result Value Ref Range   Influenza A By PCR NEGATIVE NEGATIVE   Influenza B By PCR NEGATIVE NEGATIVE    Comment: (NOTE) The Xpert Xpress Flu assay is intended as an aid in the diagnosis of  influenza and should not be used as a sole basis for treatment.  This  assay is FDA approved for nasopharyngeal swab specimens only. Nasal  washings and aspirates are unacceptable for Xpert Xpress Flu testing. Performed at [facility], [place] [address] [place], [place] [address]    Dg Chest 2 View  Result Date: 04/02/2018 CLINICAL DATA:  82 year old with current history of chronic diastolic heart failure, scleroderma presenting with recent onset of progressively worsening cough and shortness of breath. Former 25 pack-year history smoker. EXAM: CHEST - 2 VIEW COMPARISON:  03/29/2018, 01/16/2017 and earlier. FINDINGS: Cardiac silhouette mildly enlarged, unchanged. Thoracic aorta atherosclerotic, unchanged. Hilar and mediastinal contours otherwise unremarkable. Stable mild hyperinflation. Pleuroparenchymal scarring in the lung apices, unchanged. Chronic interstitial lung disease, unchanged. Pulmonary venous hypertension without overt edema currently. No confluent  airspace consolidation. No pleural effusions. Mild degenerative changes involving the thoracic and upper lumbar spine. IMPRESSION: Stable mild cardiomegaly. Stable COPD/emphysema and chronic interstitial lung disease. No acute cardiopulmonary disease. Electronically Signed   By: Evangeline Dakin M.D.   On: 04/02/2018 12:56   Ct Chest Wo Contrast  Result Date: 04/02/2018 CLINICAL DATA:  Acute respiratory  illness, respiratory distress, cough for 8 days, worsening cough and increased work of breathing, history of hypertension, pulmonary hypertension, scleroderma, former smoker EXAM: CT CHEST WITHOUT CONTRAST TECHNIQUE: Multidetector CT imaging of the chest was performed following the standard protocol without IV contrast. COMPARISON:  07/21/2016 FINDINGS: Cardiovascular: Extensive atherosclerotic calcifications aorta, coronary arteries and proximal great vessels. Aorta normal caliber. No pericardial effusion. Mediastinum/Nodes: Wall thickening of distal esophagus versus small collapsed hiatal hernia. Remainder of esophagus unremarkable. No thoracic adenopathy. Base of cervical region normal appearance. Lungs/Pleura: Chronic peribronchial thickening and bronchial cartilaginous calcification. Biapical scarring. Scattered areas of atelectasis and parenchymal scarring in both lungs are again identified, some which are stellate nodular in appearance and grossly unchanged from the previous exam. These include RIGHT upper lobe stellate nodules measuring 9 mm diameter image 46, 7 mm diameters 59, and 4 mm diameter image 63. Similar slightly smaller nodules are seen within the LEFT upper lobe. Nodular focus identified in the LEFT lower lobe adjacent to the major fissure measures 8 mm diameter, unchanged. Observed areas are consistent with parenchymal lung disease related to patient's history of scleroderma. No new areas of infiltrate, pleural effusion or pneumothorax. Upper Abdomen: Mild intrahepatic and proximal  extrahepatic biliary dilatation again seen. Remaining visualized upper abdomen unremarkable. Musculoskeletal: No acute osseous findings. IMPRESSION: Extensive chronic lung disease changes consistent with scleroderma, including scattered areas of atelectasis/scarring and multiple ill-defined nodular foci, some which are stellate in appearance, unchanged since 07/21/2016; recommend continued surveillance of the nodular foci to definitively exclude enlarging pulmonary nodules/neoplasm. Resolution of previously identified RIGHT apical infiltrate. No new parenchymal lung abnormalities. Extensive atherosclerotic calcification. Aortic Atherosclerosis (ICD10-I70.0). Electronically Signed   By: Lavonia Dana M.D.   On: 04/02/2018 18:00   EKG Interpretation  Date/Time:  Tuesday April 02 2018 13:23:25 EST Ventricular Rate:  79 PR Interval:    QRS Duration: 96 QT Interval:  402 QTC Calculation: 461 R Axis:   51 Text Interpretation:  Sinus rhythm RSR' in V1 or V2, right VCD or RVH No significant change since last tracing Confirmed by Isla Pence [phone]) on 04/02/2018 1:28:57 PM   Pending Labs Unresulted Labs (From admission, onward)    Start     Ordered   04/02/18 1924  Respiratory Panel by PCR  (Respiratory virus panel with precautions)  Once,   R     04/02/18 1924          Vitals/Pain Today's Vitals   04/02/18 1830 04/02/18 1900 04/02/18 1930 04/02/18 2158  BP: 134/76 120/71 137/69 (!) 153/83  Pulse: 79 84 76 90  Resp: (!) _0 (!) 24  Temp:      TempSrc:      SpO2: 100% 100% 100% 100%  Weight:      Height:        Isolation Precautions Droplet precaution  Medications Medications  dextromethorphan-guaiFENesin (MUCINEX DM) 30-600 MG per 12 hr tablet 1 tablet (has no administration in time range)  ipratropium-albuterol (DUONEB) 0.5-2.5 (3) MG/3ML nebulizer solution 3 mL (3 mLs Nebulization Given 04/02/18 1301)  methylPREDNISolone sodium succinate (SOLU-MEDROL) 125 mg/2 mL  injection 125 mg (125 mg Intravenous Given 04/02/18 1304)  albuterol (PROVENTIL) (2.5 MG/3ML) 0.083% nebulizer solution 5 mg (5 mg Nebulization Given 04/02/18 1555)  chlorpheniramine-HYDROcodone (TUSSIONEX) 10-8 MG/5ML suspension 5 mL (5 mLs Oral Given 04/02/18 1656)    Mobility walks with person assist

## 2018-04-02 NOTE — ED Provider Notes (Signed)
[facility] DEPT Provider Note   CSN: [id number] Arrival date & time: 04/02/18  1139     History   Chief Complaint Chief Complaint  Patient presents with  . Respiratory Distress  . Cough    HPI Meghan Wise is a 82 y.o. female.  Pt presents to the ED today with cough and sob.  The pt said she has been coughing for several days.  She was seen here on 12/6 for the same.  She was d/c on Zithromax.  Pt said she took her last dose today and is not any better.  Pt does have a hx of scleroderma of lungs/kidneys and pulmonary hypertension.  She normally wears 2L oxygen via [place] at rest and 4 when she moves around.  She has had to increase it lately.  No fever.  She is coughing up sputum.     Past Medical History:  Diagnosis Date  . Heart failure ([place])   . HTN (hypertension)   . Hyperlipidemia   . Hypothyroidism   . Kidney disease    Stage IV- Dr. Servando Salina -LOV 02-11-14  . Osteoarthritis   . Osteoporosis   . Pulmonary hypertension ([place])   . Raynaud disease    reactive with cold and stress mostly fingers.  . Scleroderma ([place])    lungs and kidneys  . Shortness of breath dyspnea    with exertion-in Cardiac rehab program at [facility].    Patient Active Problem List   Diagnosis Date Noted  . PNA (pneumonia) 07/22/2016  . Acute on chronic respiratory failure with hypoxia ([place]) 07/21/2016  . Community acquired pneumonia 07/21/2016  . CKD (chronic kidney disease) stage 4, GFR 15-29 ml/min (HCC) 07/21/2016  . Anemia of chronic disease 07/21/2016  . AVM (arteriovenous malformation) of duodenum, acquired 05/07/2014  . Chronic diastolic CHF (congestive heart failure), NYHA class 4 ([place]) 05/28/2012  . HTN (hypertension)   . Raynaud disease   . Hypothyroidism     Past Surgical History:  Procedure Laterality Date  . BREAST BIOPSY Bilateral    x   . CARDIAC CATHETERIZATION     2'15 [facility]  . CHOLECYSTECTOMY    . CHOLECYSTECTOMY, LAPAROSCOPIC    .  COLONOSCOPY WITH PROPOFOL N/A 05/07/2014   Procedure: COLONOSCOPY WITH PROPOFOL;  Surgeon: Inda Castle, MD;  Location: [facility] ENDOSCOPY;  Service: Endoscopy;  Laterality: N/A;  . ESOPHAGOGASTRODUODENOSCOPY (EGD) WITH PROPOFOL N/A 05/07/2014   Procedure: ESOPHAGOGASTRODUODENOSCOPY (EGD) WITH PROPOFOL;  Surgeon: Inda Castle, MD;  Location: [facility] ENDOSCOPY;  Service: Endoscopy;  Laterality: N/A;  . HOT HEMOSTASIS N/A 05/07/2014   Procedure: HOT HEMOSTASIS (ARGON PLASMA COAGULATION/BICAP);  Surgeon: Inda Castle, MD;  Location: [facility];  Service: Endoscopy;  Laterality: N/A;  deuodenal bulb  . SHOULDER ARTHROSCOPY W/ ROTATOR CUFF REPAIR Right      OB History   None      Home Medications    Prior to Admission medications   Medication Sig Start Date End Date Taking? Authorizing Provider  acetaminophen (TYLENOL) 500 MG tablet Take 1,000 mg by mouth every morning.   Yes [provider]  allopurinol (ZYLOPRIM) 100 MG tablet Take 100 mg by mouth daily. 11/13/16  Yes [provider]  ambrisentan (LETAIRIS) 10 MG tablet Take 10 mg by mouth every morning.    Yes [provider]  amLODipine (NORVASC) 5 MG tablet Take 5 mg by mouth every morning. 03/15/16  Yes [provider]  azithromycin (ZITHROMAX) 250 MG tablet Take 1 tablet (  250 mg total) by mouth daily. 03/29/18  Yes Hayden Rasmussen, MD  Cholecalciferol (VITAMIN D3) 2000 units capsule Take 2,000 Units by mouth daily.   Yes [provider]  denosumab (PROLIA) 60 MG/ML SOSY injection Inject 60 mg into the skin every 6 (six) months.   Yes [provider]  fluticasone (FLONASE) 50 MCG/ACT nasal spray Place 1 spray into both nostrils daily.  11/30/17  Yes [provider]  folic acid (FOLVITE) 867 MCG tablet Take 400 mcg by mouth every evening.    Yes [provider]  levothyroxine (SYNTHROID, LEVOTHROID) 50 MCG tablet Take 50 mcg by mouth every morning.   Yes [provider]  Liniments (SALONPAS ARTHRITIS PAIN RELIEF EX) Apply 1 application topically daily as needed (pain).   Yes [provider]  loperamide (IMODIUM A-D) 2 MG tablet Take 2 mg by mouth 4 (four) times daily as needed for diarrhea or loose stools.   Yes [provider]  Melatonin 10 MG TABS Take 1 tablet by mouth 3 times/day as needed-between meals & bedtime (sleep).    Yes [provider]  mycophenolate (CELLCEPT) 500 MG tablet Take 2 tablets (1,000 mg total) by mouth 2 (two) times daily. Take 1 tab twice a day for 7 days, then increase to 2 tabs twice a day. 03/14/18  Yes Juanito Doom, MD  omeprazole (PRILOSEC) 20 MG capsule Take 20 mg by mouth daily.   Yes [provider]  OXYGEN Inhale 2-4 L into the lungs at bedtime. 4 L with exertion 2 L at night   Yes [provider]  predniSONE (DELTASONE) 5 MG tablet Take 7.5 mg by mouth daily.  03/01/18  Yes [provider]  torsemide (DEMADEX) 10 MG tablet Take 10 mg by mouth daily. 01/24/18  Yes [provider]  torsemide (DEMADEX) 20 MG tablet Take 10 mg by mouth See admin instructions. Takes mon and fri only    [provider]    Family History Family History  Problem Relation Age of Onset  . Heart disease Mother   . Heart disease Father   . Cancer Unknown        husband--esophageal  . Diabetes Son   . Stroke Daughter   . Breast cancer Neg Hx     Social History Social History   Tobacco Use  . Smoking status: Former Smoker    Packs/day: 1.50    Years: 60.00    Pack years: 90.00    Types: Cigarettes    Last attempt to quit: 04/25/1987    Years since quitting: 30.9  . Smokeless tobacco: Never Used  Substance Use Topics  . Alcohol use: Yes    Alcohol/week: 0.0 standard drinks    Comment: OCCASIONALLY- rare  . Drug use: No     Allergies   Losartan; Ace inhibitors; Sulfa antibiotics; and Codeine   Review of Systems Review of Systems    Respiratory: Positive for cough and shortness of breath.   All other systems reviewed and are negative.    Physical Exam Updated Vital Signs BP (!) 125/56   Pulse 70   Temp 98.8 F (37.1 C) (Oral)   Resp (!) 21   Ht _0  (1.473 m)   Wt 48.1 kg   SpO2 100%   BMI 22.16 kg/m   Physical Exam  Constitutional: She is oriented to person, place, and time. She appears well-developed and well-nourished.  HENT:  Head: Normocephalic and atraumatic.  Right Ear: External  ear normal.  Left Ear: External ear normal.  Nose: Nose normal.  Mouth/Throat: Oropharynx is clear and moist.  Eyes: Pupils are equal, round, and reactive to light. Conjunctivae and EOM are normal.  Neck: Normal range of motion. Neck supple.  Cardiovascular: Regular rhythm.  Pulmonary/Chest: She has wheezes.  Abdominal: Soft. Bowel sounds are normal.  Musculoskeletal: Normal range of motion.  Neurological: She is alert and oriented to person, place, and time.  Skin: Skin is warm. Capillary refill takes less than 2 seconds.  Psychiatric: She has a normal mood and affect. Her behavior is normal. Judgment and thought content normal.  Nursing note and vitals reviewed.    ED Treatments / Results  Labs (all labs ordered are listed, but only abnormal results are displayed) Labs Reviewed  BASIC METABOLIC PANEL - Abnormal; Notable for the following components:      Result Value   Chloride 113 (*)    CO2 19 (*)    Glucose, Bld 109 (*)    BUN 45 (*)    Creatinine, Ser 1.58 (*)    GFR calc non Af Amer 31 (*)    GFR calc Af Amer 35 (*)    All other components within normal limits  BRAIN NATRIURETIC PEPTIDE - Abnormal; Notable for the following components:   B Natriuretic Peptide 231.9 (*)    All other components within normal limits  CBC WITH DIFFERENTIAL/PLATELET - Abnormal; Notable for the following components:   WBC 10.7 (*)    RBC 3.40 (*)    Hemoglobin 10.1 (*)    HCT 32.2 (*)    RDW 16.7 (*)    Neutro Abs  9.8 (*)    Lymphs Abs 0.5 (*)    All other components within normal limits  I-STAT TROPONIN, ED    EKG EKG Interpretation  Date/Time:  Tuesday April 02 2018 13:23:25 EST Ventricular Rate:  79 PR Interval:    QRS Duration: 96 QT Interval:  402 QTC Calculation: 461 R Axis:   51 Text Interpretation:  Sinus rhythm RSR' in V1 or V2, right VCD or RVH No significant change since last tracing Confirmed by Isla Pence [phone]) on 04/02/2018 1:28:57 PM   Radiology Dg Chest 2 View  Result Date: 04/02/2018 CLINICAL DATA:  82 year old with current history of chronic diastolic heart failure, scleroderma presenting with recent onset of progressively worsening cough and shortness of breath. Former 24 pack-year history smoker. EXAM: CHEST - 2 VIEW COMPARISON:  03/29/2018, 01/16/2017 and earlier. FINDINGS: Cardiac silhouette mildly enlarged, unchanged. Thoracic aorta atherosclerotic, unchanged. Hilar and mediastinal contours otherwise unremarkable. Stable mild hyperinflation. Pleuroparenchymal scarring in the lung apices, unchanged. Chronic interstitial lung disease, unchanged. Pulmonary venous hypertension without overt edema currently. No confluent airspace consolidation. No pleural effusions. Mild degenerative changes involving the thoracic and upper lumbar spine. IMPRESSION: Stable mild cardiomegaly. Stable COPD/emphysema and chronic interstitial lung disease. No acute cardiopulmonary disease. Electronically Signed   By: Evangeline Dakin M.D.   On: 04/02/2018 12:56    Procedures Procedures (including critical care time)  Medications Ordered in ED Medications  albuterol (PROVENTIL) (2.5 MG/3ML) 0.083% nebulizer solution 5 mg (has no administration in time range)  chlorpheniramine-HYDROcodone (TUSSIONEX) 10-8 MG/5ML suspension 5 mL (has no administration in time range)  ipratropium-albuterol (DUONEB) 0.5-2.5 (3) MG/3ML nebulizer solution 3 mL (3 mLs Nebulization Given 04/02/18 1301)    methylPREDNISolone sodium succinate (SOLU-MEDROL) 125 mg/2 mL injection 125 mg (125 mg Intravenous Given 04/02/18 1304)     Initial Impression / Assessment and Plan /  ED Course  I have reviewed the triage vital signs and the nursing notes.  Pertinent labs & imaging results that were available during my care of the patient were reviewed by me and considered in my medical decision making (see chart for details).    Pt is feeling a little better after the neb and solumedrol.  When sitting, she is breathing well.  The nurse tech walked pt.  On 4L, her oxygen stayed ok, but she became tachycardic and sob.  I will order a CT chest to eval for pna.    Pt will be signed out to Dr. Jeanell Sparrow at shift change.    Final Clinical Impressions(s) / ED Diagnoses   Final diagnoses:  Acute respiratory distress    ED Discharge Orders    None       Isla Pence, MD 04/02/18 1549

## 2018-04-02 NOTE — ED Notes (Addendum)
Pt ambulate > 30 ft. Steady gait. O2SAT stayed around 96%; dropped to 94% twice (lowest). Pulse stayed around 101 bpm; highest 104 bpm. Pt asked to rest half way. Once back in room, pt reported difficulty breathing when sitting. Tech observed labored breathing, increased RR, and cough.

## 2018-04-02 NOTE — H&P (Addendum)
History and Physical    Meghan Wise HWE:[id number] DOB: 06/24/1935 DOA: 04/02/2018  PCP: Kelton Pillar, MD   Patient coming from: Home  I have personally briefly reviewed patient's old medical records in [place]  Chief Complaint: SOB, Cough  HPI: Meghan Wise is a 82 y.o. female with medical history significant for chronic diastolic CHF, scleroderma, pulmonary hypertension, hypothyroidism who presented to the ED with complaints of increasing difficulty breathing and cough productive of yellowish sputum of at least a week duration.  Patient was seen here 12/6, for similar symptoms discharged home on azithromycin, but with persistence of symptoms patient came to the ED today.  At baseline patient wears 2 L of oxygen at rest and 4 L with activity.  She reports some rhinorrhea yesterday without sore throat or myalgias.  She has had to increase her O2 to 3 L. No chest pain.  No leg swelling.  She reports compliance with twice weekly torsemide and ambrisentan.  She stopped taking mycophenolate-as she reports her symptoms worsened after she increased the dose as she started having diarrhea.  ED Course: O2 sats greater than 98% on 4 L nasal cannula.  In about baseline 1.5.  Bicarb low 19, normal anion gap 10.  BNP 231 mildly elevated from prior.  Influenza panel negative. Chest x-ray 2 view-stable COPD/emphysema and chronic ILD.  Subsequent chest CT without contrast extensive chronic lung disease changes consistent with scleroderma, scattered areas of atelectasis/scarring, multiple ill-defined nodular foci-unchanged.  Patient was treated with breathing treatments and 125 mg Solu-Medrol given without significant improvement in breathing.   Review of Systems: As per HPI all other systems reviewed and negative.  Past Medical History:  Diagnosis Date  . Heart failure ([place])   . HTN (hypertension)   . Hyperlipidemia   . Hypothyroidism   . Kidney disease    Stage IV- Dr. Servando Salina  -LOV 02-11-14  . Osteoarthritis   . Osteoporosis   . Pulmonary hypertension ([place])   . Raynaud disease    reactive with cold and stress mostly fingers.  . Scleroderma ([place])    lungs and kidneys  . Shortness of breath dyspnea    with exertion-in Cardiac rehab program at [facility].    Past Surgical History:  Procedure Laterality Date  . BREAST BIOPSY Bilateral    x   . CARDIAC CATHETERIZATION     2'15 [facility]  . CHOLECYSTECTOMY    . CHOLECYSTECTOMY, LAPAROSCOPIC    . COLONOSCOPY WITH PROPOFOL N/A 05/07/2014   Procedure: COLONOSCOPY WITH PROPOFOL;  Surgeon: Inda Castle, MD;  Location: [facility];  Service: Endoscopy;  Laterality: N/A;  . ESOPHAGOGASTRODUODENOSCOPY (EGD) WITH PROPOFOL N/A 05/07/2014   Procedure: ESOPHAGOGASTRODUODENOSCOPY (EGD) WITH PROPOFOL;  Surgeon: Inda Castle, MD;  Location: [facility] END[facility];  Service: Endoscopy;  Laterality: N/A;  . HOT HEMOSTASIS N/A 05/07/2014   Procedure: HOT HEMOSTASIS (ARGON PLASMA COAGULATION/BICAP);  Surgeon: Inda Castle, MD;  Location: [facility];  Service: Endoscopy;  Laterality: N/A;  deuodenal bulb  . SHOULDER ARTHROSCOPY W/ ROTATOR CUFF REPAIR Right      reports that she quit smoking about 30 years ago. Her smoking use included cigarettes. She has a 90.00 pack-year smoking history. She has never used smokeless tobacco. She reports that she drinks alcohol. She reports that she does not use drugs.  Allergies  Allergen Reactions  . Losartan Other (See Comments) and Cough    High calcium count and renal problems  . Ace Inhibitors Other (See Comments) and Cough  Dizziness and coughing   . Sulfa Antibiotics Hives  . Codeine Nausea Only    Family History  Problem Relation Age of Onset  . Heart disease Mother   . Heart disease Father   . Cancer Unknown        husband--esophageal  . Diabetes Son   . Stroke Daughter   . Breast cancer Neg Hx     Prior to Admission medications   Medication Sig Start Date End Date Taking?  Authorizing Provider  acetaminophen (TYLENOL) 500 MG tablet Take 1,000 mg by mouth every morning.   Yes [provider]  allopurinol (ZYLOPRIM) 100 MG tablet Take 100 mg by mouth daily. 11/13/16  Yes [provider]  ambrisentan (LETAIRIS) 10 MG tablet Take 10 mg by mouth every morning.    Yes [provider]  amLODipine (NORVASC) 5 MG tablet Take 5 mg by mouth every morning. 03/15/16  Yes [provider]  azithromycin (ZITHROMAX) 250 MG tablet Take 1 tablet (250 mg total) by mouth daily. 03/29/18  Yes Hayden Rasmussen, MD  Cholecalciferol (VITAMIN D3) 2000 units capsule Take 2,000 Units by mouth daily.   Yes [provider]  denosumab (PROLIA) 60 MG/ML SOSY injection Inject 60 mg into the skin every 6 (six) months.   Yes [provider]  fluticasone (FLONASE) 50 MCG/ACT nasal spray Place 1 spray into both nostrils daily.  11/30/17  Yes [provider]  folic acid (FOLVITE) 299 MCG tablet Take 400 mcg by mouth every evening.    Yes [provider]  levothyroxine (SYNTHROID, LEVOTHROID) 50 MCG tablet Take 50 mcg by mouth every morning.   Yes [provider]  Liniments (SALONPAS ARTHRITIS PAIN RELIEF EX) Apply 1 application topically daily as needed (pain).   Yes [provider]  loperamide (IMODIUM A-D) 2 MG tablet Take 2 mg by mouth 4 (four) times daily as needed for diarrhea or loose stools.   Yes [provider]  Melatonin 10 MG TABS Take 1 tablet by mouth 3 times/day as needed-between meals & bedtime (sleep).    Yes [provider]  mycophenolate (CELLCEPT) 500 MG tablet Take 2 tablets (1,000 mg total) by mouth 2 (two) times daily. Take 1 tab twice a day for 7 days, then increase to 2 tabs twice a day. 03/14/18  Yes Juanito Doom, MD  omeprazole (PRILOSEC) 20 MG capsule Take 20 mg by mouth daily.   Yes [provider]  OXYGEN Inhale 2-4 L into the lungs at bedtime. 4 L with  exertion 2 L at night   Yes [provider]  predniSONE (DELTASONE) 5 MG tablet Take 7.5 mg by mouth daily.  03/01/18  Yes [provider]  torsemide (DEMADEX) 10 MG tablet Take 10 mg by mouth daily. 01/24/18  Yes [provider]  torsemide (DEMADEX) 20 MG tablet Take 10 mg by mouth See admin instructions. Takes mon and fri only    [provider]    Physical Exam: Vitals:   04/02/18 1637 04/02/18 1700 04/02/18 1800 04/02/18 1830  BP: (!) 150/69 (!) 148/70 140/77 134/76  Pulse: 94 98 88 79  Resp: (!) 25 (!) 22 18 (!) 23  Temp:      TempSrc:      SpO2: 100% 100% 100% 100%  Weight:      Height:        Constitutional: thin, mildly short of breath Vitals:   04/02/18 1637 04/02/18 1700 04/02/18 1800 04/02/18 1830  BP: Marland Kitchen)  150/69 (!) 148/70 140/77 134/76  Pulse: 94 98 88 79  Resp: (!) 25 (!) 22 18 (!) 23  Temp:      TempSrc:      SpO2: 100% 100% 100% 100%  Weight:      Height:       Eyes: PERRL, lids and conjunctivae normal ENMT: Mucous membranes are dry. Posterior pharynx clear of any exudate or lesions. Neck: normal, supple, no masses, no thyromegaly Respiratory: Mild rhonchi anteriorly, coarse/bronchial breath sounds posteriorly, no wheezing, no crackles. Mild increased work of breathing.  No accessory muscle use Cardiovascular: Regular rate and rhythm, no murmurs / rubs / gallops. No extremity edema. 2+ pedal pulses.   Abdomen: no tenderness, no masses palpated. No hepatosplenomegaly. Bowel sounds positive.  Musculoskeletal: no clubbing / cyanosis. No joint deformity upper and lower extremities. Good ROM, no contractures. Normal muscle tone.  Skin: no rashes, lesions, ulcers. No induration Neurologic: CN 2-12 grossly intact.  Strength 5/5 in all 4.  Psychiatric: Normal judgment and insight. Alert and oriented x 3. Normal mood.   Labs on Admission: I have personally reviewed following labs and imaging studies  CBC: Recent Labs  Lab  03/29/18 1223 04/02/18 1415  WBC 7.2 10.7*  NEUTROABS  --  9.8*  HGB 11.2* 10.1*  HCT 36.4 32.2*  MCV 94.5 94.7  PLT 250 621   Basic Metabolic Panel: Recent Labs  Lab 03/29/18 1223 04/02/18 1257  NA 140 142  K 4.1 4.2  CL 111 113*  CO2 17* 19*  GLUCOSE 112* 109*  BUN 43* 45*  CREATININE 1.52* 1.58*  CALCIUM 8.7* 9.5   GFR: Estimated Creatinine Clearance: 18.3 mL/min (A) (by C-G formula based on SCr of 1.58 mg/dL (H)).  Radiological Exams on Admission: Dg Chest 2 View  Result Date: 04/02/2018 CLINICAL DATA:  82 year old with current history of chronic diastolic heart failure, scleroderma presenting with recent onset of progressively worsening cough and shortness of breath. Former 40 pack-year history smoker. EXAM: CHEST - 2 VIEW COMPARISON:  03/29/2018, 01/16/2017 and earlier. FINDINGS: Cardiac silhouette mildly enlarged, unchanged. Thoracic aorta atherosclerotic, unchanged. Hilar and mediastinal contours otherwise unremarkable. Stable mild hyperinflation. Pleuroparenchymal scarring in the lung apices, unchanged. Chronic interstitial lung disease, unchanged. Pulmonary venous hypertension without overt edema currently. No confluent airspace consolidation. No pleural effusions. Mild degenerative changes involving the thoracic and upper lumbar spine. IMPRESSION: Stable mild cardiomegaly. Stable COPD/emphysema and chronic interstitial lung disease. No acute cardiopulmonary disease. Electronically Signed   By: Evangeline Dakin M.D.   On: 04/02/2018 12:56   Ct Chest Wo Contrast  Result Date: 04/02/2018 CLINICAL DATA:  Acute respiratory illness, respiratory distress, cough for 8 days, worsening cough and increased work of breathing, history of hypertension, pulmonary hypertension, scleroderma, former smoker EXAM: CT CHEST WITHOUT CONTRAST TECHNIQUE: Multidetector CT imaging of the chest was performed following the standard protocol without IV contrast. COMPARISON:  07/21/2016 FINDINGS:  Cardiovascular: Extensive atherosclerotic calcifications aorta, coronary arteries and proximal great vessels. Aorta normal caliber. No pericardial effusion. Mediastinum/Nodes: Wall thickening of distal esophagus versus small collapsed hiatal hernia. Remainder of esophagus unremarkable. No thoracic adenopathy. Base of cervical region normal appearance. Lungs/Pleura: Chronic peribronchial thickening and bronchial cartilaginous calcification. Biapical scarring. Scattered areas of atelectasis and parenchymal scarring in both lungs are again identified, some which are stellate nodular in appearance and grossly unchanged from the previous exam. These include RIGHT upper lobe stellate nodules measuring 9 mm diameter image 46, 7 mm diameters 59, and 4 mm diameter image 63. Similar  slightly smaller nodules are seen within the LEFT upper lobe. Nodular focus identified in the LEFT lower lobe adjacent to the major fissure measures 8 mm diameter, unchanged. Observed areas are consistent with parenchymal lung disease related to patient's history of scleroderma. No new areas of infiltrate, pleural effusion or pneumothorax. Upper Abdomen: Mild intrahepatic and proximal extrahepatic biliary dilatation again seen. Remaining visualized upper abdomen unremarkable. Musculoskeletal: No acute osseous findings. IMPRESSION: Extensive chronic lung disease changes consistent with scleroderma, including scattered areas of atelectasis/scarring and multiple ill-defined nodular foci, some which are stellate in appearance, unchanged since 07/21/2016; recommend continued surveillance of the nodular foci to definitively exclude enlarging pulmonary nodules/neoplasm. Resolution of previously identified RIGHT apical infiltrate. No new parenchymal lung abnormalities. Extensive atherosclerotic calcification. Aortic Atherosclerosis (ICD10-I70.0). Electronically Signed   By: Lavonia Dana M.D.   On: 04/02/2018 18:00    EKG: Independently reviewed.  QTc  461.  No significant change from prior.  Assessment/Plan Active Problems:   HTN (hypertension)   Hypothyroidism   Chronic diastolic CHF (congestive heart failure), NYHA class 4 (HCC)   Acute on chronic respiratory failure with hypoxia (HCC)   SOB (shortness of breath)   Scleroderma (HCC)   Acute on chronic respiratory failure- SOB, productive cough. no actual documented hypoxia but increased O2 demands.  Likely secondary to progressive scleroderma lung disease, also a component of  bronchitis. Completed 5 day course of azithromycin.  Groins are negative.  Chest CT without acute abnormalities.  IV Solu-Medrol 125 given in the ED - Cont IV Solu-Medrol 40 BID - Duonebs - Sch, PRN - Mucolytics, supplemental O2 -Doubt need for additional antibiotics at this time -Respiratory virus panel -Consider pulmonology consult in a.m. if no improvement  Scleroderma lung disease, Pulmonary hypertension-follows with Dr. Lake Bells.  On Ambrisartan. Recently started on mycophenolate-with worsening hypoxemia and shortness of breath.  Patient could not tolerate increased dose of mycophenolate-onset of diarrhea, and she feels her symptoms worsen since starting this medication.  Last echo on file 2014-PA pressure 44. -Continue ambrisentan -IV steroids, hold home low-dose prednisone.  Chronic diastolic CHF-appears stable/appears slightly dry. Last echo 2014.  EF 60 to 65%, G1DD.  BNP- 231, elevations to 400s. -On torsemide 68m twice weekly-resume if prolonged hospital admission.  Hypothyroidism - Cont home Synthroid.  Pulmonary nodules-  - F/u for repeats CT as outpatient  CKD- Cr 1.58, about Baseline.  DVT prophylaxis: Lovenox Code Status:  full Family Communication: None at bedside Disposition Plan: Per rounding team Consults called: none Admission status: obs, tele   EBethena RoysMD [facility] Pager [phone] 6PM-2AM.  Otherwise please contact  night-coverage [email or web address] Password [facility] 04/02/2018, 7:43 PM

## 2018-04-03 DIAGNOSIS — J9621 Acute and chronic respiratory failure with hypoxia: Secondary | ICD-10-CM | POA: Diagnosis not present

## 2018-04-03 DIAGNOSIS — I159 Secondary hypertension, unspecified: Secondary | ICD-10-CM | POA: Diagnosis not present

## 2018-04-03 DIAGNOSIS — I5032 Chronic diastolic (congestive) heart failure: Secondary | ICD-10-CM | POA: Diagnosis not present

## 2018-04-03 DIAGNOSIS — R0602 Shortness of breath: Secondary | ICD-10-CM

## 2018-04-03 LAB — RESPIRATORY PANEL BY PCR
Adenovirus: NOT DETECTED
Bordetella pertussis: NOT DETECTED
Chlamydophila pneumoniae: NOT DETECTED
Coronavirus 229E: NOT DETECTED
Coronavirus HKU1: NOT DETECTED
Coronavirus NL63: NOT DETECTED
Coronavirus OC43: NOT DETECTED
Influenza A: NOT DETECTED
Influenza B: NOT DETECTED
Metapneumovirus: NOT DETECTED
Mycoplasma pneumoniae: NOT DETECTED
PARAINFLUENZA VIRUS 1-RVPPCR: DETECTED — AB
Parainfluenza Virus 2: NOT DETECTED
Parainfluenza Virus 3: NOT DETECTED
Parainfluenza Virus 4: NOT DETECTED
Respiratory Syncytial Virus: NOT DETECTED
Rhinovirus / Enterovirus: NOT DETECTED

## 2018-04-03 MED ORDER — ACETAMINOPHEN 650 MG RE SUPP: 650.0000 mg | Freq: Four times a day (QID) | RECTAL | Status: DC | PRN

## 2018-04-03 MED ORDER — FLUTICASONE PROPIONATE 50 MCG/ACT NA SUSP
1.0000 | Freq: Every day | NASAL | Status: DC
  Administered 2018-04-03 – 2018-04-04 (×2): 1 via NASAL
  Filled 2018-04-03: qty 16

## 2018-04-03 MED ORDER — IPRATROPIUM-ALBUTEROL 0.5-2.5 (3) MG/3ML IN SOLN: 3.0000 mL | Freq: Two times a day (BID) | RESPIRATORY_TRACT | Status: DC

## 2018-04-03 MED ORDER — ACETAMINOPHEN 325 MG PO TABS: 650.0000 mg | ORAL_TABLET | Freq: Four times a day (QID) | ORAL | Status: DC | PRN

## 2018-04-03 MED ORDER — IPRATROPIUM-ALBUTEROL 0.5-2.5 (3) MG/3ML IN SOLN: 3.0000 mL | RESPIRATORY_TRACT | Status: DC | PRN

## 2018-04-03 MED ORDER — AMLODIPINE BESYLATE 5 MG PO TABS
5.0000 mg | ORAL_TABLET | Freq: Every day | ORAL | Status: DC
  Administered 2018-04-03 – 2018-04-04 (×2): 5 mg via ORAL
  Filled 2018-04-03 (×2): qty 1

## 2018-04-03 MED ORDER — ALLOPURINOL 100 MG PO TABS
100.0000 mg | ORAL_TABLET | Freq: Every day | ORAL | Status: DC
  Administered 2018-04-03 – 2018-04-04 (×2): 100 mg via ORAL
  Filled 2018-04-03 (×2): qty 1

## 2018-04-03 MED ORDER — LEVOTHYROXINE SODIUM 50 MCG PO TABS
50.0000 ug | ORAL_TABLET | Freq: Every day | ORAL | Status: DC
  Administered 2018-04-03 – 2018-04-04 (×2): 50 ug via ORAL
  Filled 2018-04-03 (×2): qty 1

## 2018-04-03 MED ORDER — SODIUM CHLORIDE 0.9 % IV SOLN: INTRAVENOUS | Status: DC

## 2018-04-03 MED ORDER — HYDROCOD POLST-CPM POLST ER 10-8 MG/5ML PO SUER
5.0000 mL | Freq: Two times a day (BID) | ORAL | Status: DC | PRN
  Administered 2018-04-03 (×2): 5 mL via ORAL
  Filled 2018-04-03 (×2): qty 5

## 2018-04-03 MED ORDER — POLYETHYLENE GLYCOL 3350 17 G PO PACK: 17.0000 g | PACK | Freq: Every day | ORAL | Status: DC | PRN

## 2018-04-03 MED ORDER — HEPARIN SODIUM (PORCINE) 5000 UNIT/ML IJ SOLN
5000.0000 [IU] | Freq: Three times a day (TID) | INTRAMUSCULAR | Status: DC
  Administered 2018-04-03: 5000 [IU] via SUBCUTANEOUS
  Filled 2018-04-03 (×3): qty 1

## 2018-04-03 MED ORDER — BOOST PLUS PO LIQD
237.0000 mL | Freq: Two times a day (BID) | ORAL | Status: DC
  Administered 2018-04-03: 237 mL via ORAL
  Filled 2018-04-03 (×3): qty 237

## 2018-04-03 MED ORDER — ONDANSETRON HCL 4 MG/2ML IJ SOLN: 4.0000 mg | Freq: Four times a day (QID) | INTRAMUSCULAR | Status: DC | PRN

## 2018-04-03 MED ORDER — PREDNISONE 20 MG PO TABS
20.0000 mg | ORAL_TABLET | Freq: Every day | ORAL | Status: DC
  Administered 2018-04-04: 20 mg via ORAL
  Filled 2018-04-03: qty 1

## 2018-04-03 MED ORDER — ONDANSETRON HCL 4 MG PO TABS: 4.0000 mg | ORAL_TABLET | Freq: Four times a day (QID) | ORAL | Status: DC | PRN

## 2018-04-03 MED ORDER — ORAL CARE MOUTH RINSE
15.0000 mL | Freq: Two times a day (BID) | OROMUCOSAL | Status: DC
  Administered 2018-04-03: 15 mL via OROMUCOSAL

## 2018-04-03 MED ORDER — METHYLPREDNISOLONE SODIUM SUCC 40 MG IJ SOLR
40.0000 mg | Freq: Two times a day (BID) | INTRAMUSCULAR | Status: DC
  Administered 2018-04-03: 40 mg via INTRAVENOUS
  Filled 2018-04-03: qty 1

## 2018-04-03 MED ORDER — PANTOPRAZOLE SODIUM 40 MG PO TBEC
40.0000 mg | DELAYED_RELEASE_TABLET | Freq: Every day | ORAL | Status: DC
  Administered 2018-04-03 – 2018-04-04 (×2): 40 mg via ORAL
  Filled 2018-04-03 (×2): qty 1

## 2018-04-03 MED ORDER — IPRATROPIUM-ALBUTEROL 0.5-2.5 (3) MG/3ML IN SOLN
3.0000 mL | Freq: Four times a day (QID) | RESPIRATORY_TRACT | Status: DC
  Administered 2018-04-03: 3 mL via RESPIRATORY_TRACT
  Filled 2018-04-03 (×2): qty 3

## 2018-04-03 MED ORDER — AMBRISENTAN 5 MG PO TABS
10.0000 mg | ORAL_TABLET | Freq: Every morning | ORAL | Status: DC
  Administered 2018-04-03 – 2018-04-04 (×2): 10 mg via ORAL

## 2018-04-03 MED ORDER — ENOXAPARIN SODIUM 30 MG/0.3ML ~~LOC~~ SOLN: 30.0000 mg | SUBCUTANEOUS | Status: DC

## 2018-04-03 MED ORDER — TADALAFIL 20 MG PO TABS
40.0000 mg | ORAL_TABLET | Freq: Every evening | ORAL | Status: DC
  Administered 2018-04-03: 40 mg via ORAL
  Filled 2018-04-03 (×2): qty 2

## 2018-04-03 NOTE — Consult Note (Addendum)
NAME:  Meghan Wise, MRN:  [id number], DOB:  July 15, 1935, LOS: 0 ADMISSION DATE:  04/02/2018, CONSULTATION DATE:  04/03/18 REFERRING MD:  Dr. Erlinda Hong / [facility], CHIEF COMPLAINT:  Cough  Brief History   82 y/o F, former smoker, with scleroderma, pulmonary hypertension and newly diagnosed ILD who presented to [facility] on 12/10 with increased cough, weakness/fatigue, decreased appetite and diarrhea (recently started on mycophenolate).    History of present illness   82 y/o F who presented to [facility] on 12/10 with reports of increasing shortness of breath.    The patient is followed by Dr. Lake Bells (last seen in office 11/21) and [facility] for scleroderma associated pulmonary hypertension.  She is a former smoker (30 years 1ppd, quit 1989).  She was initially diagnosed with scleroderma in the 2015 in the setting of SOB, elevated calcium and lymphadenopathy.  Further work up at [facility] showed [place].  After the diagnosis she had difficulties with dysphagia and has undergone esophageal stretching by Dr. Benson Norway.  She was initially treated with ambrisentan + tadalafil & torsemide.  She was started on O2 in 2016.  She had one episode of treated PNA in 2018 but recovered from that illness.  She was seen at [facility] (12/2017) and underwent a RHC which showed improvement in her pulmonary vascular pressures.  6 minute walk distance was down.  CT of the chest at that time showed progression of pulmonary parenchymal disease with ground glass in the bases.  As of 01/2018 she had been noticing increased difficulties swallowing.  She underwent an MBS 10/25 which was concerning for primary esophageal dysphagia.   Discussion regarding her new GGO on CT was jointly reviewed by Dr. Lake Bells & Dr. Dossie Der and she was started on Mycophenolate 02/27/18 for ILD.  The patient had significant diarrhea after increasing to full dose and she stopped therapy.    She was admitted per [facility] 12/10 for further evaluation of cough, cough related SOB,  weakness/fatigue.  RVP was found to be positive for parainfluenza virus 1.  Currently the patient reports she has been feeling poorly for 1-1.5 week.  She had increased her mycophenolate and noted an increase in her diarrhea.  Her son & his family have been sick with a viral illness.  Their symptoms have been similar in nature.  She reports she has felt too tired and weak to walk to the dining room for food as she normally does.  She has not had vomiting or choking episodes recently.  Reports when she urinates it is dark in color.  Reports some sputum production but mostly non-productive cough.  Occasional blood streaks but she reports this is "normal for her".  Denies fevers/chills.  Some post nasal drip.    Past Medical History  O2 dependent chronic hypoxic respiratory failure -2L at rest, 4L with exertion  WHO Group 1 Pulmonary Hypertension  Scleroderma  Former Smoker  Dysphagia    Significant Hospital Events   12/10  Admit   Consults:  PCCM 12/11   Procedures:     Significant Diagnostic Tests:  CT Chest w/o 12/11 >> extensive chronic lung disease changes consistent with scleroderma, including scattered areas of atelectasis/scarring & multiple ill-defined nodular foci (unchanged from 07/21/16), resolution of right apical infiltrate, extensive atherosclerotic calcification  Micro Data:  RVP 12/10 >> parainfluenza virus positive   Antimicrobials:     Interim history/subjective:  Pt reports wanting to feel better.  Her son in [place] lost his wife yesterday and she  is very concerned about his situation.    Objective   Blood pressure 129/64, pulse 73, temperature 98.7 F (37.1 C), temperature source Oral, resp. rate 18, height _0  (1.473 m), weight 44.4 kg, SpO2 96 %.       No intake or output data in the 24 hours ending 04/03/18 1302 Filed Weights   04/02/18 1212 04/03/18 0019  Weight: 48.1 kg 44.4 kg    Examination: General: frail elderly female lying in bed in NAD HEENT:  MM pink/dry, no jvd Neuro: AAOx4, speech clear, MAE  CV: s1s2 rrr, no m/r/g PULM: even/non-labored, lungs bilaterally clear anterior, occasional crackles bibasilar posterior  IW:LNLG, non-tender, bsx4 active  Extremities: warm/dry, no peripheral edema  Skin: no rashes or lesions  Resolved Hospital Problem list      Assessment & Plan:   Cough  Acute Viral Illness / Parainfluenza 1 Virus  -suspect acute illness is related to viral illness superimposed on possible hypovolemia / diarrhea from mycophenolate and chronic diuresis for PH + baseline chronic pulmonary disease.  No significant change in CT this admit from prior.   P: Mucinex DM PRN Tussionex for cough suppression  Supportive care  Solumedrol 40 mg IV Q12 (hold home prednisone).  Consider transition back to prednisone 12/12.   Diarrhea  -expected side effect of mycophenolate, increased with dose increase of drug P: Hold mycophenolate indefinitely > pt does not want to restart this medication Encourage PO intake for next 24 hours Hold home torsemide 12/11    Pulmonary Hypertension  P: Continue letaris, tadalafil  Hold home torsemide 12/11    Scleroderma with Esophageal (Dysphagia, Strictures) & Pulmonary Manifestations (ILD) P: Hold mycophenolate as above  Hold home prednisone as above PPI   Best practice:  Diet: heart healthy  Pain/Anxiety/Delirium protocol (if indicated): n/a VAP protocol (if indicated): n/a DVT prophylaxis: heparin sq GI prophylaxis: PPI  Glucose control: n/a Mobility: as tolerated Code Status: Full Code  Family Communication: patient updated on plan of care  Disposition: medical floor   Labs   CBC: Recent Labs  Lab 03/29/18 1223 04/02/18 1415  WBC 7.2 10.7*  NEUTROABS  --  9.8*  HGB 11.2* 10.1*  HCT 36.4 32.2*  MCV 94.5 94.7  PLT 250 921    Basic Metabolic Panel: Recent Labs  Lab 03/29/18 1223 04/02/18 1257  NA 140 142  K 4.1 4.2  CL 111 113*  CO2 17* 19*  GLUCOSE  112* 109*  BUN 43* 45*  CREATININE 1.52* 1.58*  CALCIUM 8.7* 9.5   GFR: Estimated Creatinine Clearance: 18.3 mL/min (A) (by C-G formula based on SCr of 1.58 mg/dL (H)). Recent Labs  Lab 03/29/18 1223 04/02/18 1415  WBC 7.2 10.7*    Liver Function Tests: Recent Labs  Lab 03/29/18 1223  AST 29  ALT 41  ALKPHOS 130*  BILITOT 0.6  PROT 7.4  ALBUMIN 4.0   Recent Labs  Lab 03/29/18 1223  LIPASE 38   No results for input(s): AMMONIA in the last 168 hours.  ABG    Component Value Date/Time   HCO3 20.1 07/21/2016 1605   TCO2 21 07/21/2016 1605   ACIDBASEDEF 4.0 (H) 07/21/2016 1605   O2SAT 36.0 07/21/2016 1605     Coagulation Profile: No results for input(s): INR, PROTIME in the last 168 hours.  Cardiac Enzymes: No results for input(s): CKTOTAL, CKMB, CKMBINDEX, TROPONINI in the last 168 hours.  HbA1C: No results found for: HGBA1C  CBG: No results for input(s): GLUCAP in the last  168 hours.  Review of Systems: Positives in [place]  Gen: Denies fever, chills, weight change, fatigue, night sweats HEENT: Denies blurred vision, double vision, hearing loss, tinnitus, sinus congestion, rhinorrhea, sore throat, neck stiffness, dysphagia PULM: Denies shortness of breath associated with cough, cough, sputum production, hemoptysis, wheezing CV: Denies chest pain, edema, orthopnea, paroxysmal nocturnal dyspnea, palpitations GI: Denies abdominal pain, nausea, vomiting, diarrhea, hematochezia, melena, constipation, change in bowel habits GU: Denies dysuria, hematuria, polyuria, oliguria, urethral discharge Endocrine: Denies hot or cold intolerance, polyuria, polyphagia or appetite change Derm: Denies rash, dry skin, scaling or peeling skin change Heme: Denies easy bruising, bleeding, bleeding gums Neuro: Denies headache, numbness, weakness, slurred speech, loss of memory or consciousness   Past Medical History  She,  has a past medical history of Heart failure ([place]), HTN  (hypertension), Hyperlipidemia, Hypothyroidism, Kidney disease, Osteoarthritis, Osteoporosis, Pulmonary hypertension ([place]), Raynaud disease, Scleroderma ([place]), and Shortness of breath dyspnea.   Surgical History    Past Surgical History:  Procedure Laterality Date  . BREAST BIOPSY Bilateral    x   . CARDIAC CATHETERIZATION     2'15 [facility]  . CHOLECYSTECTOMY    . CHOLECYSTECTOMY, LAPAROSCOPIC    . COLONOSCOPY WITH PROPOFOL N/A 05/07/2014   Procedure: COLONOSCOPY WITH PROPOFOL;  Surgeon: Inda Castle, MD;  Location: [facility];  Service: Endoscopy;  Laterality: N/A;  . ESOPHAGOGASTRODUODENOSCOPY (EGD) WITH PROPOFOL N/A 05/07/2014   Procedure: ESOPHAGOGASTRODUODENOSCOPY (EGD) WITH PROPOFOL;  Surgeon: Inda Castle, MD;  Location: [facility];  Service: Endoscopy;  Laterality: N/A;  . HOT HEMOSTASIS N/A 05/07/2014   Procedure: HOT HEMOSTASIS (ARGON PLASMA COAGULATION/BICAP);  Surgeon: Inda Castle, MD;  Location: [facility];  Service: Endoscopy;  Laterality: N/A;  deuodenal bulb  . SHOULDER ARTHROSCOPY W/ ROTATOR CUFF REPAIR Right      Social History   reports that she quit smoking about 30 years ago. Her smoking use included cigarettes. She has a 90.00 pack-year smoking history. She has never used smokeless tobacco. She reports that she drinks alcohol. She reports that she does not use drugs.   Family History   Her family history includes Cancer in her unknown relative; Diabetes in her son; Heart disease in her father and mother; Stroke in her daughter. There is no history of Breast cancer.   Allergies Allergies  Allergen Reactions  . Losartan Other (See Comments) and Cough    High calcium count and renal problems  . Ace Inhibitors Other (See Comments) and Cough    Dizziness and coughing   . Sulfa Antibiotics Hives  . Codeine Nausea Only     Home Medications  Prior to Admission medications   Medication Sig Start Date End Date Taking? Authorizing Provider  acetaminophen  (TYLENOL) 500 MG tablet Take 1,000 mg by mouth every morning.   Yes [provider]  allopurinol (ZYLOPRIM) 100 MG tablet Take 100 mg by mouth daily. 11/13/16  Yes [provider]  ambrisentan (LETAIRIS) 10 MG tablet Take 10 mg by mouth every morning.    Yes [provider]  amLODipine (NORVASC) 5 MG tablet Take 5 mg by mouth every morning. 03/15/16  Yes [provider]  azithromycin (ZITHROMAX) 250 MG tablet Take 1 tablet (250 mg total) by mouth daily. 03/29/18  Yes Hayden Rasmussen, MD  Cholecalciferol (VITAMIN D3) 2000 units capsule Take 2,000 Units by mouth daily.   Yes [provider]  denosumab (PROLIA) 60 MG/ML SOSY injection Inject 60 mg into the skin every 6 (six) months.  Yes [provider]  fluticasone (FLONASE) 50 MCG/ACT nasal spray Place 1 spray into both nostrils daily.  11/30/17  Yes [provider]  folic acid (FOLVITE) 384 MCG tablet Take 400 mcg by mouth every evening.    Yes [provider]  levothyroxine (SYNTHROID, LEVOTHROID) 50 MCG tablet Take 50 mcg by mouth every morning.   Yes [provider]  Liniments (SALONPAS ARTHRITIS PAIN RELIEF EX) Apply 1 application topically daily as needed (pain).   Yes [provider]  loperamide (IMODIUM A-D) 2 MG tablet Take 2 mg by mouth 4 (four) times daily as needed for diarrhea or loose stools.   Yes [provider]  Melatonin 10 MG TABS Take 1 tablet by mouth 3 times/day as needed-between meals & bedtime (sleep).    Yes [provider]  mycophenolate (CELLCEPT) 500 MG tablet Take 2 tablets (1,000 mg total) by mouth 2 (two) times daily. Take 1 tab twice a day for 7 days, then increase to 2 tabs twice a day. 03/14/18  Yes Juanito Doom, MD  omeprazole (PRILOSEC) 20 MG capsule Take 20 mg by mouth daily.   Yes [provider]  OXYGEN Inhale 2-4 L into the lungs at bedtime. 4 L with exertion 2 L at night   Yes [provider]  predniSONE (DELTASONE) 5 MG tablet Take 7.5 mg by mouth daily.  03/01/18  Yes [provider]  torsemide (DEMADEX) 10 MG tablet Take 10 mg by mouth daily. 01/24/18  Yes [provider]  torsemide (DEMADEX) 20 MG tablet Take 10 mg by mouth See admin instructions. Takes mon and fri only    [provider]          Noe Gens, NP-C [facility] Pgr: [phone] or if no answer [phone] 04/03/2018, 1:02 PM

## 2018-04-03 NOTE — Progress Notes (Signed)
PROGRESS NOTE  Meghan Wise FQH:[id number] DOB: 1935-07-16 DOA: 04/02/2018 PCP: Kelton Pillar, MD  HPI/Recap of past 24 hours:  Reports cough is horrible, reports she has been coughing for more than a week No fever,  Denies pain, no edema  Assessment/Plan: Principal Problem:   SOB (shortness of breath) Active Problems:   HTN (hypertension)   Hypothyroidism   Chronic diastolic CHF (congestive heart failure), NYHA class 4 (HCC)   Acute on chronic respiratory failure with hypoxia (HCC)   Scleroderma (HCC)  Acute on Chronic hypoxia respiratory failure (On 2liter at rest, 4liters with activity at baseline) -worsening of dyspnea likely due to parainfuluenza virus -she is started on stress dose steroids  Scleroderma/interstitial lung disease/pulmonary hypertension -continue home meds ambirsentan,  Tadalafil, she is currently on stress dose steroids, ( on 7.43m prednisone at home) -hold mycophenolate as she has stopped taking a week ago due to diarrhea -pulmonology consulted, will follow recommendation   Chronic diastolic CHF-appears stable/appears slightly dry. Last echo 2014.  EF 60 to 65%, G1DD.  BNP- 231, elevations to 400s. -On torsemide 184mtwice weekly-resume at discharge. -avoid hydration in the setting of severe pulmonary hypertension  Hypothyroidism - Cont home Synthroid.  CKDIII Cr at baseline, renal dosing meds  Code Status: full  Family Communication: patient   Disposition Plan: home in 1-2 days , need pulmonology clearance  Tele has been unremarkable, will d/c tele  Consultants:  pulmonology  Procedures:  none  Antibiotics:  none   Objective: BP 129/64 (BP Location: Left Arm)   Pulse 73   Temp 98.7 F (37.1 C) (Oral)   Resp 18   Ht _0  (1.473 m)   Wt 44.4 kg   SpO2 96%   BMI 20.46 kg/m  No intake or output data in the 24 hours ending 04/03/18 1355 Filed Weights   04/02/18 1212 04/03/18 0019  Weight: 48.1 kg 44.4 kg     Exam: Patient is examined daily including today on 04/03/2018, exams remain the same as of yesterday except that has changed    General:  Thin, NAD  Cardiovascular: RRR  Respiratory: diminished, mild bibasilar rales, no wheezing,  Abdomen: Soft/ND/NT, positive BS  Musculoskeletal: No Edema  Neuro: alert, oriented   Data Reviewed: Basic Metabolic Panel: Recent Labs  Lab 03/29/18 1223 04/02/18 1257  NA 140 142  K 4.1 4.2  CL 111 113*  CO2 17* 19*  GLUCOSE 112* 109*  BUN 43* 45*  CREATININE 1.52* 1.58*  CALCIUM 8.7* 9.5   Liver Function Tests: Recent Labs  Lab 03/29/18 1223  AST 29  ALT 41  ALKPHOS 130*  BILITOT 0.6  PROT 7.4  ALBUMIN 4.0   Recent Labs  Lab 03/29/18 1223  LIPASE 38   No results for input(s): AMMONIA in the last 168 hours. CBC: Recent Labs  Lab 03/29/18 1223 04/02/18 1415  WBC 7.2 10.7*  NEUTROABS  --  9.8*  HGB 11.2* 10.1*  HCT 36.4 32.2*  MCV 94.5 94.7  PLT 250 249   Cardiac Enzymes:   No results for input(s): CKTOTAL, CKMB, CKMBINDEX, TROPONINI in the last 168 hours. BNP (last 3 results) Recent Labs    04/02/18 1257  BNP 231.9*    ProBNP (last 3 results) No results for input(s): PROBNP in the last 8760 hours.  CBG: No results for input(s): GLUCAP in the last 168 hours.  Recent Results (from the past 240 hour(s))  Respiratory Panel by PCR     Status: Abnormal  Collection Time: 04/02/18  5:52 PM  Result Value Ref Range Status   Adenovirus NOT DETECTED NOT DETECTED Final   Coronavirus 229E NOT DETECTED NOT DETECTED Final   Coronavirus HKU1 NOT DETECTED NOT DETECTED Final   Coronavirus NL63 NOT DETECTED NOT DETECTED Final   Coronavirus OC43 NOT DETECTED NOT DETECTED Final   Metapneumovirus NOT DETECTED NOT DETECTED Final   Rhinovirus / Enterovirus NOT DETECTED NOT DETECTED Final   Influenza A NOT DETECTED NOT DETECTED Final   Influenza B NOT DETECTED NOT DETECTED Final   Parainfluenza Virus 1 DETECTED (A) NOT  DETECTED Final   Parainfluenza Virus 2 NOT DETECTED NOT DETECTED Final   Parainfluenza Virus 3 NOT DETECTED NOT DETECTED Final   Parainfluenza Virus 4 NOT DETECTED NOT DETECTED Final   Respiratory Syncytial Virus NOT DETECTED NOT DETECTED Final   Bordetella pertussis NOT DETECTED NOT DETECTED Final   Chlamydophila pneumoniae NOT DETECTED NOT DETECTED Final   Mycoplasma pneumoniae NOT DETECTED NOT DETECTED Final    Comment: Performed at [facility], [place] [address] [place], [place] [address]     Studies: Ct Chest Wo Contrast  Result Date: 04/02/2018 CLINICAL DATA:  Acute respiratory illness, respiratory distress, cough for 8 days, worsening cough and increased work of breathing, history of hypertension, pulmonary hypertension, scleroderma, former smoker EXAM: CT CHEST WITHOUT CONTRAST TECHNIQUE: Multidetector CT imaging of the chest was performed following the standard protocol without IV contrast. COMPARISON:  07/21/2016 FINDINGS: Cardiovascular: Extensive atherosclerotic calcifications aorta, coronary arteries and proximal great vessels. Aorta normal caliber. No pericardial effusion. Mediastinum/Nodes: Wall thickening of distal esophagus versus small collapsed hiatal hernia. Remainder of esophagus unremarkable. No thoracic adenopathy. Base of cervical region normal appearance. Lungs/Pleura: Chronic peribronchial thickening and bronchial cartilaginous calcification. Biapical scarring. Scattered areas of atelectasis and parenchymal scarring in both lungs are again identified, some which are stellate nodular in appearance and grossly unchanged from the previous exam. These include RIGHT upper lobe stellate nodules measuring 9 mm diameter image 46, 7 mm diameters 59, and 4 mm diameter image 63. Similar slightly smaller nodules are seen within the LEFT upper lobe. Nodular focus identified in the LEFT lower lobe adjacent to the major fissure measures 8 mm diameter, unchanged. Observed areas are  consistent with parenchymal lung disease related to patient's history of scleroderma. No new areas of infiltrate, pleural effusion or pneumothorax. Upper Abdomen: Mild intrahepatic and proximal extrahepatic biliary dilatation again seen. Remaining visualized upper abdomen unremarkable. Musculoskeletal: No acute osseous findings. IMPRESSION: Extensive chronic lung disease changes consistent with scleroderma, including scattered areas of atelectasis/scarring and multiple ill-defined nodular foci, some which are stellate in appearance, unchanged since 07/21/2016; recommend continued surveillance of the nodular foci to definitively exclude enlarging pulmonary nodules/neoplasm. Resolution of previously identified RIGHT apical infiltrate. No new parenchymal lung abnormalities. Extensive atherosclerotic calcification. Aortic Atherosclerosis (ICD10-I70.0). Electronically Signed   By: Lavonia Dana M.D.   On: 04/02/2018 18:00    Scheduled Meds: . allopurinol  100 mg Oral Daily  . ambrisentan  10 mg Oral q morning - 10a  . amLODipine  5 mg Oral Daily  . dextromethorphan-guaiFENesin  1 tablet Oral BID  . fluticasone  1 spray Each Nare Daily  . heparin injection (subcutaneous)  5,000 Units Subcutaneous Q8H  . ipratropium-albuterol  3 mL Nebulization BID  . lactose free nutrition  237 mL Oral BID BM  . levothyroxine  50 mcg Oral QAC breakfast  . mouth rinse  15 mL Mouth Rinse BID  . methylPREDNISolone (SOLU-MEDROL)  injection  40 mg Intravenous Q12H  . pantoprazole  40 mg Oral Daily  . tadalafil  40 mg Oral QPM    Continuous Infusions:   Time spent: 16mns, case discussed with pulmonology PA Ms OAlfredo MartinezI have personally reviewed and interpreted on  04/03/2018 daily labs, tele strips, imagings as discussed above under date review session and assessment and plans.  I reviewed all nursing notes, pharmacy notes, consultant notes,  vitals, pertinent old records  I have discussed plan of care as described above  with RN , patient  on 04/03/2018   FFlorencia ReasonsMD, PhD  [facility] Pager [phone] If 7PM-7AM, please contact night-coverage at [email or web address], password [email or web address]12/02/2018, 1:55 PM  LOS: 0 days

## 2018-04-04 ENCOUNTER — Encounter (HOSPITAL_COMMUNITY): Payer: Self-pay

## 2018-04-04 DIAGNOSIS — R0602 Shortness of breath: Secondary | ICD-10-CM | POA: Diagnosis not present

## 2018-04-04 DIAGNOSIS — R0600 Dyspnea, unspecified: Secondary | ICD-10-CM | POA: Diagnosis present

## 2018-04-04 DIAGNOSIS — I5032 Chronic diastolic (congestive) heart failure: Secondary | ICD-10-CM | POA: Diagnosis not present

## 2018-04-04 DIAGNOSIS — J9621 Acute and chronic respiratory failure with hypoxia: Secondary | ICD-10-CM | POA: Diagnosis not present

## 2018-04-04 MED ORDER — PREDNISONE 5 MG PO TABS: 7.5000 mg | ORAL_TABLET | Freq: Every day | ORAL | 0 refills | Status: DC

## 2018-04-04 MED ORDER — DM-GUAIFENESIN ER 30-600 MG PO TB12: 1.0000 | ORAL_TABLET | Freq: Two times a day (BID) | ORAL | 0 refills | Status: AC

## 2018-04-04 MED ORDER — HYDROCOD POLST-CPM POLST ER 10-8 MG/5ML PO SUER: 5.0000 mL | Freq: Two times a day (BID) | ORAL | 0 refills | Status: DC | PRN

## 2018-04-04 MED ORDER — PREDNISONE 20 MG PO TABS: 20.0000 mg | ORAL_TABLET | Freq: Every day | ORAL | 0 refills | Status: AC

## 2018-04-04 NOTE — Discharge Summary (Signed)
Discharge Summary  Meghan Wise OBS:[id number] DOB: 1936/02/09  PCP: Meghan Pillar, MD  Admit date: 04/02/2018 Discharge date: 04/04/2018  Time spent: 62mns  Recommendations for Outpatient Follow-up:  1. F/u with PCP within a week  for hospital discharge follow up, repeat cbc/bmp at follow up. 2. F/u with pulmonology Dr MLake Bells Discharge Diagnoses:  Active Hospital Problems   Diagnosis Date Noted  . SOB (shortness of breath) 04/02/2018  . Dyspnea 04/04/2018  . Scleroderma ([place] 04/02/2018  . Acute on chronic respiratory failure with hypoxia ([place] 07/21/2016  . Chronic diastolic CHF (congestive heart failure), NYHA class 4 ([facility] 05/28/2012  . HTN (hypertension)   . Hypothyroidism     Resolved Hospital Problems  No resolved problems to display.    Discharge Condition: stable  Diet recommendation: heart healthy  Filed Weights   04/02/18 1212 04/03/18 0019  Weight: 48.1 kg 44.4 kg    History of present illness: (per admitting MD Dr EDenton Brick  PCP: GKelton Pillar MD   Patient coming from: Home  I have personally briefly reviewed [place] Chief Complaint: SOB, Cough  HPI: Meghan HALLEYis a 82y.o. female with medical history significant for chronic diastolic CHF, scleroderma, pulmonary hypertension, hypothyroidism who presented to the ED with complaints of increasing difficulty breathing and cough productive of yellowish sputum of at least a week duration.  Patient was seen here 12/6, for similar symptoms discharged home on azithromycin, but with persistence of symptoms patient came to the ED today.  At baseline patient wears 2 L of oxygen at rest and 4 L with activity.  She reports some rhinorrhea yesterday without sore throat or myalgias.  She has had to increase her O2 to 3 L. No chest pain.  No leg swelling.  She reports compliance with twice weekly torsemide and ambrisentan.  She stopped taking  mycophenolate-as she reports her symptoms worsened after she increased the dose as she started having diarrhea.  ED Course: O2 sats greater than 98% on 4 L nasal cannula.  In about baseline 1.5.  Bicarb low 19, normal anion gap 10.  BNP 231 mildly elevated from prior.  Influenza panel negative. Chest x-ray 2 view-stable COPD/emphysema and chronic ILD.  Subsequent chest CT without contrast extensive chronic lung disease changes consistent with scleroderma, scattered areas of atelectasis/scarring, multiple ill-defined nodular foci-unchanged.  Patient was treated with breathing treatments and 125 mg Solu-Medrol given without significant improvement in breathing.   Hospital Course:  Principal Problem:   SOB (shortness of breath) Active Problems:   HTN (hypertension)   Hypothyroidism   Chronic diastolic CHF (congestive heart failure), NYHA class 4 (HCC)   Acute on chronic respiratory failure with hypoxia (HCC)   Scleroderma (HCC)   Dyspnea  Acute on Chronic hypoxia respiratory failure (On 2liter at rest, 4liters with activity at baseline) -worsening of dyspnea likely due to parainfuluenza virus -she is started on stress dose steroids, she is cleared to discharge home on prednisone 219mdaily for another 2days, then go back on 7.67m68maily   Scleroderma/interstitial lung disease/pulmonary hypertension -continue home meds ambirsentan,  Tadalafil, she is currently on stress dose steroids, ( on 7.67mg18mednisone at home) -hold mycophenolate as she has stopped taking a week ago due to diarrhea -pulmonology consulted, she is cleared to discharge home on prednisone 20mg28mly for another 2days, then go back on 7.67mg d44my -she is to follow up with pulmonology   Chronic diastolic CHF-appears stable/appears slightly dry.  Last echo 2014.EF 60 to 65%, G1DD.BNP- 231,elevations to 400s. -On torsemide11mtwice weekly-resume at discharge. -drink 64-96 ounces of water 2 more days then resume  [facility] , per pulmonology Dr MLake Bells  Hypothyroidism - [place] Synthroid.  CKDIII Cr at baseline, renal dosing meds  Code Status: full  Family Communication: patient   Disposition Plan: home with pulmonology clearance    Consultants:  pulmonology  Procedures:  none  Antibiotics:  none   Discharge Exam: BP 132/62 (BP Location: Left Arm)   Pulse 68   Temp 98 F (36.7 C) (Oral)   Resp 20   Ht _0  (1.473 m)   Wt 44.4 kg   SpO2 96%   BMI 20.46 kg/m     General:  Thin, NAD  Cardiovascular: RRR  Respiratory: diminished, mild bibasilar rales, no wheezing,  Abdomen: Soft/ND/NT, positive BS  Musculoskeletal: No Edema  Neuro: alert, oriented    Discharge Instructions You were cared for by a hospitalist during your hospital stay. If you have any questions about your discharge medications or the care you received while you were in the hospital after you are discharged, you can call the unit and asked to speak with the hospitalist on call if the hospitalist that took care of you is not available. Once you are discharged, your primary care physician will handle any further medical issues. Please note that NO REFILLS for any discharge medications will be authorized once you are discharged, as it is imperative that you return to your primary care physician (or establish a relationship with a primary care physician if you do not have one) for your aftercare needs so that they can reassess your need for medications and monitor your lab values.  Discharge Instructions    Diet - low sodium heart healthy   Complete by:  As directed    Increase activity slowly   Complete by:  As directed      Allergies as of 04/04/2018      Reactions   Losartan Other (See Comments), Cough   High calcium count and renal problems   Ace Inhibitors Other (See Comments), Cough   Dizziness and coughing   Sulfa Antibiotics Hives    Codeine Nausea Only   Heparin Anxiety, Other (See Comments)   Pt reports that they have a sensitivity towards heparin. Pt becomes depressed and anxious to the point of crying uncontrollably.       Medication List    STOP taking these medications   azithromycin 250 MG tablet Commonly known as:  ZITHROMAX   mycophenolate 500 MG tablet Commonly known as:  CELLCEPT     TAKE these medications   acetaminophen 500 MG tablet Commonly known as:  TYLENOL Take 1,000 mg by mouth every morning.   allopurinol 100 MG tablet Commonly known as:  ZYLOPRIM Take 100 mg by mouth daily.   ambrisentan 10 MG tablet Commonly known as:  LETAIRIS Take 10 mg by mouth every morning.   amLODipine 5 MG tablet Commonly known as:  NORVASC Take 5 mg by mouth every morning.   chlorpheniramine-HYDROcodone 10-8 MG/5ML Suer Commonly known as:  TUSSIONEX Take 5 mLs by mouth every 12 (twelve) hours as needed for cough.   denosumab 60 MG/ML Sosy injection Commonly known as:  PROLIA Inject 60 mg into the skin every 6 (six) months.   dextromethorphan-guaiFENesin 30-600 MG 12hr tablet Commonly known as:  MUCINEX DM Take 1 tablet by mouth 2 (two) times daily for  10 days.   fluticasone 50 MCG/ACT nasal spray Commonly known as:  FLONASE Place 1 spray into both nostrils daily.   folic acid 686 MCG tablet Commonly known as:  FOLVITE Take 400 mcg by mouth every evening.   levothyroxine 50 MCG tablet Commonly known as:  SYNTHROID, LEVOTHROID Take 50 mcg by mouth every morning.   loperamide 2 MG tablet Commonly known as:  IMODIUM A-D Take 2 mg by mouth 4 (four) times daily as needed for diarrhea or loose stools.   Melatonin 10 MG Tabs Take 1 tablet by mouth 3 times/day as needed-between meals & bedtime (sleep).   omeprazole 20 MG capsule Commonly known as:  PRILOSEC Take 20 mg by mouth daily.   OXYGEN Inhale 2-4 L into the lungs at bedtime. 4 L with exertion 2 L at night   predniSONE 20 MG  tablet Commonly known as:  DELTASONE Take 1 tablet (20 mg total) by mouth daily with breakfast for 2 days. Start taking on:  April 05, 2018 What changed:    medication strength  how much to take  when to take this   predniSONE 5 MG tablet Commonly known as:  DELTASONE Take 1.5 tablets (7.5 mg total) by mouth daily. Start taking on:  April 07, 2018 What changed:  You were already taking a medication with the same name, and this prescription was added. Make sure you understand how and when to take each.   SALONPAS ARTHRITIS PAIN RELIEF EX Apply 1 application topically daily as needed (pain).   tadalafil 20 MG tablet Commonly known as:  ADCIRCA/CIALIS Take 40 mg by mouth every evening.   torsemide 20 MG tablet Commonly known as:  DEMADEX Take 10 mg by mouth See admin instructions. Takes mon and fri only   Vitamin D3 50 MCG (2000 UT) capsule Take 2,000 Units by mouth daily.      Allergies  Allergen Reactions  . Losartan Other (See Comments) and Cough    High calcium count and renal problems  . Ace Inhibitors Other (See Comments) and Cough    Dizziness and coughing   . Sulfa Antibiotics Hives  . Codeine Nausea Only  . Heparin Anxiety and Other (See Comments)    Pt reports that they have a sensitivity towards heparin. Pt becomes depressed and anxious to the point of crying uncontrollably.    Follow-up Information    Meghan Pillar, MD Follow up in 1 week(s).   Specialty:  Family Medicine Why:  hospital discharge follow up, repeat cbc/bmp at follow up Contact information: [address] [place] [address] [phone]        Juanito Doom, MD Follow up.   Specialty:  Pulmonary Disease Contact information: [place] [address] [phone]            The results of significant diagnostics from this hospitalization (including imaging, microbiology, ancillary and laboratory) are listed below for  reference.    Significant Diagnostic Studies: Dg Chest 2 View  Result Date: 04/02/2018 CLINICAL DATA:  82 year old with current history of chronic diastolic heart failure, scleroderma presenting with recent onset of progressively worsening cough and shortness of breath. Former 35 pack-year history smoker. EXAM: CHEST - 2 VIEW COMPARISON:  03/29/2018, 01/16/2017 and earlier. FINDINGS: Cardiac silhouette mildly enlarged, unchanged. Thoracic aorta atherosclerotic, unchanged. Hilar and mediastinal contours otherwise unremarkable. Stable mild hyperinflation. Pleuroparenchymal scarring in the lung apices, unchanged. Chronic interstitial lung disease, unchanged. Pulmonary venous hypertension without overt  edema currently. No confluent airspace consolidation. No pleural effusions. Mild degenerative changes involving the thoracic and upper lumbar spine. IMPRESSION: Stable mild cardiomegaly. Stable COPD/emphysema and chronic interstitial lung disease. No acute cardiopulmonary disease. Electronically Signed   By: Evangeline Dakin M.D.   On: 04/02/2018 12:56   Dg Chest 2 View  Result Date: 03/29/2018 CLINICAL DATA:  Heart failure. EXAM: CHEST - 2 VIEW COMPARISON:  01/16/2017 FINDINGS: Cardiomediastinal silhouette is normal. Mediastinal contours appear intact. Hilar fullness may represent lymphadenopathy. Calcific atherosclerotic disease of the aorta. There is no evidence of focal airspace consolidation, pleural effusion or pneumothorax. Chronic bronchitic changes. Biapical subpleural scarring. Osseous structures are without acute abnormality. Soft tissues are grossly normal. IMPRESSION: Chronic bronchitic changes and biapical subpleural scarring. Hilar fullness, query lymphadenopathy. Electronically Signed   By: Fidela Salisbury M.D.   On: 03/29/2018 13:36   Ct Chest Wo Contrast  Result Date: 04/02/2018 CLINICAL DATA:  Acute respiratory illness, respiratory distress, cough for 8 days, worsening cough and  increased work of breathing, history of hypertension, pulmonary hypertension, scleroderma, former smoker EXAM: CT CHEST WITHOUT CONTRAST TECHNIQUE: Multidetector CT imaging of the chest was performed following the standard protocol without IV contrast. COMPARISON:  07/21/2016 FINDINGS: Cardiovascular: Extensive atherosclerotic calcifications aorta, coronary arteries and proximal great vessels. Aorta normal caliber. No pericardial effusion. Mediastinum/Nodes: Wall thickening of distal esophagus versus small collapsed hiatal hernia. Remainder of esophagus unremarkable. No thoracic adenopathy. Base of cervical region normal appearance. Lungs/Pleura: Chronic peribronchial thickening and bronchial cartilaginous calcification. Biapical scarring. Scattered areas of atelectasis and parenchymal scarring in both lungs are again identified, some which are stellate nodular in appearance and grossly unchanged from the previous exam. These include RIGHT upper lobe stellate nodules measuring 9 mm diameter image 46, 7 mm diameters 59, and 4 mm diameter image 63. Similar slightly smaller nodules are seen within the LEFT upper lobe. Nodular focus identified in the LEFT lower lobe adjacent to the major fissure measures 8 mm diameter, unchanged. Observed areas are consistent with parenchymal lung disease related to patient's history of scleroderma. No new areas of infiltrate, pleural effusion or pneumothorax. Upper Abdomen: Mild intrahepatic and proximal extrahepatic biliary dilatation again seen. Remaining visualized upper abdomen unremarkable. Musculoskeletal: No acute osseous findings. IMPRESSION: Extensive chronic lung disease changes consistent with scleroderma, including scattered areas of atelectasis/scarring and multiple ill-defined nodular foci, some which are stellate in appearance, unchanged since 07/21/2016; recommend continued surveillance of the nodular foci to definitively exclude enlarging pulmonary nodules/neoplasm.  Resolution of previously identified RIGHT apical infiltrate. No new parenchymal lung abnormalities. Extensive atherosclerotic calcification. Aortic Atherosclerosis (ICD10-I70.0). Electronically Signed   By: Lavonia Dana M.D.   On: 04/02/2018 18:00    Microbiology: Recent Results (from the past 240 hour(s))  Respiratory Panel by PCR     Status: Abnormal   Collection Time: 04/02/18  5:52 PM  Result Value Ref Range Status   Adenovirus NOT DETECTED NOT DETECTED Final   Coronavirus 229E NOT DETECTED NOT DETECTED Final   Coronavirus HKU1 NOT DETECTED NOT DETECTED Final   Coronavirus NL63 NOT DETECTED NOT DETECTED Final   Coronavirus OC43 NOT DETECTED NOT DETECTED Final   Metapneumovirus NOT DETECTED NOT DETECTED Final   Rhinovirus / Enterovirus NOT DETECTED NOT DETECTED Final   Influenza A NOT DETECTED NOT DETECTED Final   Influenza B NOT DETECTED NOT DETECTED Final   Parainfluenza Virus 1 DETECTED (A) NOT DETECTED Final   Parainfluenza Virus 2 NOT DETECTED NOT DETECTED Final   Parainfluenza Virus 3 NOT DETECTED  NOT DETECTED Final   Parainfluenza Virus 4 NOT DETECTED NOT DETECTED Final   Respiratory Syncytial Virus NOT DETECTED NOT DETECTED Final   Bordetella pertussis NOT DETECTED NOT DETECTED Final   Chlamydophila pneumoniae NOT DETECTED NOT DETECTED Final   Mycoplasma pneumoniae NOT DETECTED NOT DETECTED Final    Comment: Performed at [facility], [place] [address], [place], [place] [address]     Labs: Basic Metabolic Panel: Recent Labs  Lab 03/29/18 1223 04/02/18 1257  NA 140 142  K 4.1 4.2  CL 111 113*  CO2 17* 19*  GLUCOSE 112* 109*  BUN 43* 45*  CREATININE 1.52* 1.58*  CALCIUM 8.7* 9.5   Liver Function Tests: Recent Labs  Lab 03/29/18 1223  AST 29  ALT 41  ALKPHOS 130*  BILITOT 0.6  PROT 7.4  ALBUMIN 4.0   Recent Labs  Lab 03/29/18 1223  LIPASE 38   No results for input(s): AMMONIA in the last 168 hours. CBC: Recent Labs  Lab 03/29/18 1223  04/02/18 1415  WBC 7.2 10.7*  NEUTROABS  --  9.8*  HGB 11.2* 10.1*  HCT 36.4 32.2*  MCV 94.5 94.7  PLT 250 249   Cardiac Enzymes: No results for input(s): CKTOTAL, CKMB, CKMBINDEX, TROPONINI in the last 168 hours. BNP: BNP (last 3 results) Recent Labs    04/02/18 1257  BNP 231.9*    ProBNP (last 3 results) No results for input(s): PROBNP in the last 8760 hours.  CBG: No results for input(s): GLUCAP in the last 168 hours.     Signed:  Florencia Reasons MD, PhD  [facility] 04/04/2018, 4:51 PM

## 2018-04-04 NOTE — Progress Notes (Signed)
[facility] PCCM  S: Feels better, still coughing, has been up walking around and felt good with that, eating OK, no more diarrhea, tussionex helps  O: Vitals:   04/03/18 1251 04/03/18 2032 04/04/18 0357 04/04/18 1240  BP: 129/64 (!) 143/73 (!) 131/59 132/62  Pulse: 73 72 70 68  Resp:  20 20   Temp: 98.7 F (37.1 C) 97.9 F (36.6 C) 97.9 F (36.6 C) 98 F (36.7 C)  TempSrc: Oral Oral Oral Oral  SpO2: 96% 95% 93% 96%  Weight:      Height:       General:  Resting comfortably in bed HENT: NCAT OP clear PULM: Few rhonchi bases B, normal effort CV: RRR, no mgr GI: BS+, soft, nontender MSK: normal bulk and tone Neuro: awake, alert, no distress, MAEW  Impression/Plan: Acute bronchitis from parainfluenza virus: > continue supportive care > tussionex prn > delsym with guaifenesin bid  Diarrhea > drink 64-96 ounces of water 2 more days then resume regular restrictions per [facility] clinic (2L/day)  ILD Continue prednisone 7.5 mg daily Will consider imuran as outpatient  PH: Continue ambrisentan, tadalafil  OK to d/c home  Roselie Awkward, MD [place] PCCM Pager: [phone] Cell: [phone] If no response, call [phone]

## 2018-04-04 NOTE — Care Management CC44 (Signed)
Condition Code 44 Documentation Completed  Patient Details  Name: Meghan Wise MRN: [id number] Date of Birth: 08/30/1935   Condition Code 44 given:  Yes Patient signature on Condition Code 44 notice:  Yes Documentation of 2 MD's agreement:  Yes Code 44 added to claim:  Yes    Purcell Mouton, RN 04/04/2018, 4:08 PM

## 2018-04-04 NOTE — Care Management Obs Status (Signed)
[facility] NOTIFICATION   Patient Details  Name: Meghan Wise MRN: [id number] Date of Birth: Aug 25, 1935   Medicare Observation Status Notification Given:  Yes    Purcell Mouton, RN 04/04/2018, 4:08 PM

## 2018-04-05 NOTE — Progress Notes (Signed)
Chart reviewed for obs/C44 issue

## 2018-04-09 ENCOUNTER — Encounter (HOSPITAL_COMMUNITY): Payer: Self-pay

## 2018-04-11 ENCOUNTER — Encounter (HOSPITAL_COMMUNITY): Payer: Self-pay

## 2018-04-16 ENCOUNTER — Encounter (HOSPITAL_COMMUNITY): Payer: Self-pay

## 2018-04-18 ENCOUNTER — Encounter (HOSPITAL_COMMUNITY): Payer: Self-pay

## 2018-04-23 ENCOUNTER — Encounter (HOSPITAL_COMMUNITY): Payer: Self-pay

## 2018-04-25 ENCOUNTER — Encounter (HOSPITAL_COMMUNITY)
Admission: RE | Admit: 2018-04-25 | Discharge: 2018-04-25 | Disposition: A | Discharge status: Home / Self Care | Payer: Medicare Other | Source: Ambulatory Visit | Attending: Pulmonary Disease | Admitting: Pulmonary Disease

## 2018-04-25 DIAGNOSIS — I2721 Secondary pulmonary arterial hypertension: Secondary | ICD-10-CM | POA: Insufficient documentation

## 2018-04-25 DIAGNOSIS — J849 Interstitial pulmonary disease, unspecified: Secondary | ICD-10-CM | POA: Insufficient documentation

## 2018-04-26 ENCOUNTER — Encounter: Payer: Self-pay | Admitting: Nephrology

## 2018-04-29 ENCOUNTER — Other Ambulatory Visit: Payer: Self-pay | Admitting: *Deleted

## 2018-04-29 MED ORDER — PREDNISONE 5 MG PO TABS: 7.5000 mg | ORAL_TABLET | Freq: Every day | ORAL | 0 refills | Status: DC

## 2018-04-29 NOTE — Telephone Encounter (Signed)
Per last OV with Dr. [place] Bells, Continue taking prednisone at 7.5 mg daily.  Patient has follow up OV 05/10/2018.  Prednisone prescription sent to Patient preferred pharmacy.  Nothing further at this time.

## 2018-04-30 ENCOUNTER — Encounter (HOSPITAL_COMMUNITY)
Admission: RE | Admit: 2018-04-30 | Discharge: 2018-04-30 | Disposition: A | Discharge status: Home / Self Care | Payer: Self-pay | Source: Ambulatory Visit | Attending: Pulmonary Disease | Admitting: Pulmonary Disease

## 2018-05-02 ENCOUNTER — Encounter (HOSPITAL_COMMUNITY)
Admission: RE | Admit: 2018-05-02 | Discharge: 2018-05-02 | Disposition: A | Discharge status: Home / Self Care | Payer: Self-pay | Source: Ambulatory Visit | Attending: Pulmonary Disease | Admitting: Pulmonary Disease

## 2018-05-07 ENCOUNTER — Encounter (HOSPITAL_COMMUNITY): Payer: Self-pay

## 2018-05-09 ENCOUNTER — Encounter (HOSPITAL_COMMUNITY)
Admission: RE | Admit: 2018-05-09 | Discharge: 2018-05-09 | Disposition: A | Discharge status: Home / Self Care | Payer: Self-pay | Source: Ambulatory Visit | Attending: Pulmonary Disease | Admitting: Pulmonary Disease

## 2018-05-10 ENCOUNTER — Encounter: Payer: Self-pay | Admitting: Pulmonary Disease

## 2018-05-10 ENCOUNTER — Ambulatory Visit: Payer: Medicare Other | Admitting: Pulmonary Disease

## 2018-05-10 VITALS — BP 142/60 | HR 90 | Ht 58.5 in | Wt 108.0 lb

## 2018-05-10 DIAGNOSIS — Z5181 Encounter for therapeutic drug level monitoring: Secondary | ICD-10-CM

## 2018-05-10 DIAGNOSIS — I2721 Secondary pulmonary arterial hypertension: Secondary | ICD-10-CM

## 2018-05-10 DIAGNOSIS — J849 Interstitial pulmonary disease, unspecified: Secondary | ICD-10-CM

## 2018-05-10 DIAGNOSIS — J9611 Chronic respiratory failure with hypoxia: Secondary | ICD-10-CM

## 2018-05-10 DIAGNOSIS — R1310 Dysphagia, unspecified: Secondary | ICD-10-CM

## 2018-05-10 MED ORDER — ATOVAQUONE 750 MG/5ML PO SUSP: 1500.0000 mg | Freq: Every day | ORAL | 5 refills | Status: DC

## 2018-05-10 MED ORDER — MYCOPHENOLATE MOFETIL 250 MG PO CAPS: 750.0000 mg | ORAL_CAPSULE | Freq: Two times a day (BID) | ORAL | 5 refills | Status: DC

## 2018-05-10 NOTE — Patient Instructions (Signed)
Chronic respiratory failure with hypoxemia: Continue 2 L of oxygen continuously or 4 L pulse with exertion  Interstitial lung disease due to underlying scleroderma: Start taking CellCept 500 mg twice a day for 1 week then increase to 750 mg twice a day (lower dose than previous) Start taking Atovaquone 1561m daily (you cannot take bactrim) Continue prednisone 7.5 mg daily We will need to monitor your lab work: Comprehensive metabolic panel and CBC on a monthly basis, we will check this again in 4 weeks We will check a lung function test and high-resolution CT scan when you return in 2 months  Pulmonary hypertension: [facility]  We will see you back in 8 weeks with me, though I would like for you to have lab work in 4 weeks  > 50% of this 30 min visit spent face to face

## 2018-05-10 NOTE — Progress Notes (Signed)
Synopsis: Referred in 01/2018 for Pulmonary hypertension and diffuse parenchymal lung disease in the setting of underlying scleroderma. Hospitalized for parainfluenza in December 2000 21-week after starting mycophenolate  Subjective:   PATIENT ID: Meghan Wise GENDER: female DOB: [birth year], MRN: [id number]   HPI  Chief Complaint  Patient presents with  . Follow-up    pt c/o increased sob X2 days.  denies sinus/chest congestion, CP.      Meghan Wise has recovered from her episode of parainfluenza.  She said that she coughed for about 3 weeks and felt pretty poorly but now things are back to baseline.  She is using 2 L of oxygen continuously and 4 L pulse with exertion.  She continues to take prednisone 7.5 mg daily.  Initially she was reluctant to consider going back on mycophenolate but now she is willing to consider it again after some lengthy conversation today.  Leg swelling is at baseline.  She plans to go back to [facility] in April.  Past Medical History:  Diagnosis Date  . Heart failure ([place])   . HTN (hypertension)   . Hyperlipidemia   . Hypothyroidism   . Kidney disease    Stage IV- Dr. Servando Salina -LOV 02-11-14  . Osteoarthritis   . Osteoporosis   . Pulmonary hypertension ([place])   . Raynaud disease    reactive with cold and stress mostly fingers.  . Scleroderma ([place])    lungs and kidneys  . Shortness of breath dyspnea    with exertion-in Cardiac rehab program at [facility].     Review of Systems  Constitutional: Positive for weight loss. Negative for chills, fever and malaise/fatigue.  HENT: Negative for congestion, ear pain, nosebleeds, sinus pain and sore throat.   Eyes: Negative for photophobia, pain, discharge and redness.  Respiratory: Positive for cough and shortness of breath. Negative for hemoptysis, sputum production and wheezing.   Cardiovascular: Positive for palpitations and leg swelling. Negative for chest pain, orthopnea and PND.  Gastrointestinal: Negative for  abdominal pain, constipation, diarrhea, nausea and vomiting.  Genitourinary: Negative for dysuria, frequency, hematuria and urgency.  Musculoskeletal: Negative for back pain, joint pain, myalgias and neck pain.  Skin: Negative for itching and rash.  Neurological: Negative for tingling, tremors, sensory change, speech change, focal weakness, seizures, weakness and headaches.  Endo/Heme/Allergies: Does not bruise/bleed easily.  Psychiatric/Behavioral: Negative for depression, memory loss, substance abuse and suicidal ideas. The patient is not nervous/anxious.       Objective:  Physical Exam   Vitals:   05/10/18 1415  BP: (!) 142/60  Pulse: 90  SpO2: 96%  Weight: 108 lb (49 kg)  Height: 4' 10.5" (1.486 m)    2L [place]  Gen: chronically ill appearing HENT: OP clear, TM's clear, neck supple PULM: Crackles bases B, normal percussion CV: RRR, no mgr, trace edema GI: BS+, soft, nontender Derm: no cyanosis or rash Psyche: normal mood and affect   CBC    Component Value Date/Time   WBC 10.7 (H) 04/02/2018 1415   RBC 3.40 (L) 04/02/2018 1415   HGB 10.1 (L) 04/02/2018 1415   HCT 32.2 (L) 04/02/2018 1415   PLT 249 04/02/2018 1415   MCV 94.7 04/02/2018 1415   MCH 29.7 04/02/2018 1415   MCHC 31.4 04/02/2018 1415   RDW 16.7 (H) 04/02/2018 1415   LYMPHSABS 0.5 (L) 04/02/2018 1415   MONOABS 0.3 04/02/2018 1415   EOSABS 0.0 04/02/2018 1415   BASOSABS 0.0 04/02/2018 1415     Chest imaging: CT chest 12/12/12:  Bilateral irregular pulmonary nodules are all stable. Interstitial thickening most evident at the lung bases. Areas of coarse reticular scarring mostly in anterior upper l9obes and at apices are stable. Borderline mediastinal adenopathy. CT chest 02/23/15 [facility]): 1. Overall the appearance the chest is very similar to the prior examination, with slight progression of disease, as detailed above. Given the mid to upper lung predominance of the nodules and apparent perilymphatic  distribution, these findings, in association with some chronic mediastinal and hilar lymphadenopathy is most favored to represent sarcoidosis. Clinical correlation is recommended. No imaging findings to suggest interstitial lung disease at this Time. Air trapping, indicative of small airways disease. Atherosclerosis, including three-vessel coronary artery disease. Assessment for potential risk factor modification, dietary therapy or pharmacologic therapy may be warranted, if clinically indicated. January 11, 2018 CT chest from [facility]: Basilar predominant groundglass and septal thickening with scattered irregular pulmonary nodules stable to minimally progressed compared to the previous study have been present over several years with only mild progression, favored to be secondary to patient's underlying connective tissue disease stable hilar mediastinal adenopathy, minimal new left lower lobe groundglass opacities likely represent superimposed aspiration  PFT: PFT 08/12/12: TLC 108% pred, FVC 105% pred, FEV1 114% pred, FEV1/FVC 73%, FEF25-75% 59% pred, DLCO 48% pred. October 2019 forced vital capacity 1.60 L 91% predicted, total lung capacity 6.88 L 175% predicted, residual volume to 43% predicted, DLCO 6.1 mL 42% predicted  Labs:  Path:  6 Min walk 02/2018> could not complete, walked 3 minutes, made it 68 m total, O2 saturation was 93% nadir on 4L [place]  Echo: TTE 09/26/12: LVEF 60-65%, grade 1 LVDD. Mild AR. Trivial MR. Mild biatrial enlargement. RV mildly enlarged. RVSP estimated at 44 mmHg. IVC normal in size with normal respiratory collapse. TTE 05/05/13: LVEF > 55%, grade 1 LVDD, septal flattening. Severely enlarged RV, moderate dysfunction, RA moderately enlarged, IVC normal in size with abnormal respiratory collapse. Moderate TR, TRjet 4.1 m/s, RVSP 75 mmHg. February 2019 echocardiogram from [facility] normal LV systolic function, normal right ventricular systolic function, no  valvular stenosis, RVSP 57 mmHg  Other imaging: 01/2018 modified barium swallow> no difficulty with swallowing, suspect primary esophageal dysphagia  Heart Catheterization: January 11, 2018 right heart catheterization from [facility] right atrial 8, PA pressure 72/32 mean 42 wedge pressure 14, cardiac index 3.4 L/min/m, PVR 6 [place] units RHC 05/29/13: RA 13, RV 68/15, PA 72/28 (mean 45), PCWP 7, CO 3.5, CI 2.4, PVR 9.5 WU. No significant response to nitric oxide.       Assessment & Plan:   No diagnosis found.  Discussion: Fortunately Mishti has recovered.  We had a lengthy conversation in clinic today.  I explained to her that she has a worsening fibrotic lung disease due to underlying scleroderma.  I think she needs to be treated with both CellCept and nintedanib.  I really think we need to focus on the CellCept as this is potentially disease modifying whereas nintedanib is only disease controlling.  After some conversation she is willing to go back on it.  We need to also monitor disease activity with a repeat high-resolution CT scan of her chest and a lung function test.  Plan: Chronic respiratory failure with hypoxemia: Continue 2 L of oxygen continuously or 4 L pulse with exertion  Interstitial lung disease due to underlying scleroderma: Start taking CellCept 500 mg twice a day for 1 week then increase to 750 mg twice a day (lower dose than previous) Start  taking Atovaquone 1524m daily (you cannot take bactrim) Continue prednisone 7.5 mg daily We will need to monitor your lab work: Comprehensive metabolic panel and CBC on a monthly basis, we will check this again in 4 weeks We will check a lung function test and high-resolution CT scan when you return in 2 months  Pulmonary hypertension: [facility]  We will see you back in 8 weeks with me, though I would like for you to have lab work in 4 weeks  >  50% of this 30 min visit spent face to face    Current Outpatient Medications:  .  acetaminophen (TYLENOL) 500 MG tablet, Take 1,000 mg by mouth every morning., Disp: , Rfl:  .  allopurinol (ZYLOPRIM) 100 MG tablet, Take 100 mg by mouth daily., Disp: , Rfl: 0 .  ambrisentan (LETAIRIS) 10 MG tablet, Take 10 mg by mouth every morning. , Disp: , Rfl:  .  amLODipine (NORVASC) 5 MG tablet, Take 5 mg by mouth every morning., Disp: , Rfl:  .  Cholecalciferol (VITAMIN D3) 2000 units capsule, Take 2,000 Units by mouth daily., Disp: , Rfl:  .  denosumab (PROLIA) 60 MG/ML SOSY injection, Inject 60 mg into the skin every 6 (six) months., Disp: , Rfl:  .  fluticasone (FLONASE) 50 MCG/ACT nasal spray, Place 1 spray into both nostrils daily. , Disp: , Rfl: 1 .  folic acid (FOLVITE) 4861MCG tablet, Take 800 mcg by mouth every evening. , Disp: , Rfl:  .  levothyroxine (SYNTHROID, LEVOTHROID) 50 MCG tablet, Take 50 mcg by mouth every morning., Disp: , Rfl:  .  Liniments (SALONPAS ARTHRITIS PAIN RELIEF EX), Apply 1 application topically daily as needed (pain)., Disp: , Rfl:  .  loperamide (IMODIUM A-D) 2 MG tablet, Take 2 mg by mouth 4 (four) times daily as needed for diarrhea or loose stools., Disp: , Rfl:  .  Melatonin 10 MG TABS, Take 1 tablet by mouth 3 times/day as needed-between meals & bedtime (sleep). , Disp: , Rfl:  .  omeprazole (PRILOSEC) 20 MG capsule, Take 20 mg by mouth daily., Disp: , Rfl:  .  OXYGEN, Inhale 2-4 L into the lungs at bedtime. 4 L with exertion 2 L at night, Disp: , Rfl:  .  predniSONE (DELTASONE) 5 MG tablet, Take 1.5 tablets (7.5 mg total) by mouth daily., Disp: 30 tablet, Rfl: 0 .  tadalafil (ADCIRCA/CIALIS) 20 MG tablet, Take 40 mg by mouth every evening. , Disp: , Rfl:  .  torsemide (DEMADEX) 20 MG tablet, Take 10 mg by mouth See admin instructions. Takes mon and fri only, Disp: , Rfl:

## 2018-05-14 ENCOUNTER — Encounter (HOSPITAL_COMMUNITY): Payer: Self-pay

## 2018-05-16 ENCOUNTER — Encounter (HOSPITAL_COMMUNITY): Payer: Self-pay

## 2018-05-20 ENCOUNTER — Telehealth: Payer: Self-pay | Admitting: Pulmonary Disease

## 2018-05-20 NOTE — Telephone Encounter (Signed)
No known interactions. So remain on both would be my thoughts. Routing to BQ as fyi.    Meghan Wise

## 2018-05-20 NOTE — Telephone Encounter (Signed)
Called spoke with patient, advised of Aaron Edelman NP's recommendations as stated below. Patient voiced her understanding and denied any questions/concerns. Will sign off and route to BQ as FYI.

## 2018-05-20 NOTE — Telephone Encounter (Signed)
Called and spoke with Patient.  Dr Lake Bells started her on atovaquone 05/10/18.  She is having a tooth pulled tomorrow and was told that they were going to place her on amoxicillin.  She is wanting to know if she should remain on atovaquone with amoxicillin, or stop it, until finished with amoxicillin.  Will route to Wyn Quaker, NP to advise

## 2018-05-20 NOTE — Telephone Encounter (Signed)
agree

## 2018-05-21 ENCOUNTER — Other Ambulatory Visit (HOSPITAL_COMMUNITY): Payer: Self-pay

## 2018-05-21 ENCOUNTER — Encounter (HOSPITAL_COMMUNITY): Payer: Self-pay

## 2018-05-22 ENCOUNTER — Ambulatory Visit (HOSPITAL_COMMUNITY)
Admission: RE | Admit: 2018-05-22 | Discharge: 2018-05-22 | Disposition: A | Discharge status: Home / Self Care | Payer: Medicare Other | Source: Ambulatory Visit | Attending: Nephrology | Admitting: Nephrology

## 2018-05-22 DIAGNOSIS — N189 Chronic kidney disease, unspecified: Secondary | ICD-10-CM | POA: Insufficient documentation

## 2018-05-22 DIAGNOSIS — D631 Anemia in chronic kidney disease: Secondary | ICD-10-CM | POA: Insufficient documentation

## 2018-05-22 MED ORDER — SODIUM CHLORIDE 0.9 % IV SOLN
510.0000 mg | INTRAVENOUS | Status: DC
  Administered 2018-05-22: 510 mg via INTRAVENOUS
  Filled 2018-05-22: qty 17

## 2018-05-23 ENCOUNTER — Encounter (HOSPITAL_COMMUNITY): Payer: Medicare Other

## 2018-05-24 MED ORDER — PREDNISONE 5 MG PO TABS: 7.5000 mg | ORAL_TABLET | Freq: Every day | ORAL | 2 refills | Status: DC

## 2018-05-28 ENCOUNTER — Encounter (HOSPITAL_COMMUNITY): Payer: Self-pay

## 2018-05-28 DIAGNOSIS — J849 Interstitial pulmonary disease, unspecified: Secondary | ICD-10-CM | POA: Insufficient documentation

## 2018-05-28 DIAGNOSIS — I2721 Secondary pulmonary arterial hypertension: Secondary | ICD-10-CM | POA: Insufficient documentation

## 2018-05-28 NOTE — Telephone Encounter (Signed)
BQ please advise on patients e-mail above. Thank you.

## 2018-05-30 ENCOUNTER — Encounter (HOSPITAL_COMMUNITY): Admission: RE | Admit: 2018-05-30 | Payer: Self-pay | Source: Ambulatory Visit

## 2018-06-04 ENCOUNTER — Encounter (HOSPITAL_COMMUNITY): Payer: Self-pay

## 2018-06-06 ENCOUNTER — Encounter (HOSPITAL_COMMUNITY): Payer: Self-pay

## 2018-06-07 NOTE — Telephone Encounter (Signed)
06/07/2018 1218  Sorry to hear the patient is having the symptoms.  Due to the complexity of her care I am not sure if we should stop the mycophenolate at this time.   How severe is the diarrhea?  Is it loose stools once a day are we talking about repeated stools over and over again with concerns of dehydration? Any other symptoms such as weakness? Fatigue?   Please contact the patient and let me know.  We may have to table this discussion until Dr. Anastasia Pall return which I believe is on 06/11/2018.   Wyn Quaker, FNP

## 2018-06-07 NOTE — Telephone Encounter (Signed)
Primary Pulmonologist: BQ Last office visit and with whom: 05/10/2018 What do we see them for (pulmonary problems): bronchitis and pulmonary artery hypertension Last OV assessment/plan:  Editor: Juanito Doom, MD (Physician)    Chronic respiratory failure with hypoxemia: Continue 2 L of oxygen continuously or 4 L pulse with exertion  Interstitial lung disease due to underlying scleroderma: Start taking CellCept 500 mg twice a day for 1 week then increase to 750 mg twice a day (lower dose than previous) Start taking Atovaquone 1530m daily (you cannot take bactrim) Continue prednisone 7.5 mg daily We will need to monitor your lab work: Comprehensive metabolic panel and CBC on a monthly basis, we will check this again in 4 weeks We will check a lung function test and high-resolution CT scan when you return in 2 months  Pulmonary hypertension: [facility]  We will see you back in 8 weeks with me, though I would like for you to have lab work in 4 weeks     Was appointment offered to patient (explain)?  appt was denied  Reason for call: Patient emailed this morning regarding her dose of BID of Mycophenalate to 5016mis causing diarrhea for last few days. She has had this issue since 05/28/2018 still bothersome. She would like to know if she should try another medication if that is an option. BQ is out of the office today, forwarding this info to APP of the morning, BrWyn Quaker  B.Mack please advise. Thank you.

## 2018-06-07 NOTE — Telephone Encounter (Signed)
Patient returning phone call.  Patient phone number is [phone].

## 2018-06-10 ENCOUNTER — Telehealth: Payer: Self-pay

## 2018-06-10 DIAGNOSIS — I159 Secondary hypertension, unspecified: Secondary | ICD-10-CM

## 2018-06-10 NOTE — Telephone Encounter (Signed)
Sorry to hear the patient is not feeling well.  Patient could decrease CellCept down to 250 mg twice a day (for total of 500 mg daily).  For a few days and see if that helps with symptoms.  If it does not then patient can hold the CellCept for a week.  Please route this message to Dr. Lake Bells as an Juluis Rainier and to see if he has any other suggestions regarding the patient's symptoms as he is familiar with her and I have never seen her before.  Wyn Quaker, FNP

## 2018-06-10 NOTE — Telephone Encounter (Signed)
Spoke with patient. She is aware of Brian's recs. She will decrease to 232m twice daily for a few days. She stated that she will call [place] later on this week to report how she is doing.   Will route to BQ so he is aware.

## 2018-06-10 NOTE — Telephone Encounter (Signed)
Call made to patient she states since starting Cellcept she is having diarrhea every day, stomach cramping, and decreased energy. She states she is drinking plenty of water and does not feel dehydrated but it is definitely impacting her quality of life, meaning she has to wear pads or not go anywhere.   Per BQ patient was to come in for OV in March, BQ currently has no OV at this time. Patient was to come in for repeat lab work per notes. Lab order placed, patient will come by office this week for labs. Once cellcept is addressed we can route message to BQ regarding appt.   BM please advise regarding cellcept.

## 2018-06-11 ENCOUNTER — Encounter (HOSPITAL_COMMUNITY)
Admission: RE | Admit: 2018-06-11 | Discharge: 2018-06-11 | Disposition: A | Discharge status: Home / Self Care | Payer: Self-pay | Source: Ambulatory Visit | Attending: Pulmonary Disease | Admitting: Pulmonary Disease

## 2018-06-11 ENCOUNTER — Other Ambulatory Visit (INDEPENDENT_AMBULATORY_CARE_PROVIDER_SITE_OTHER): Payer: Medicare Other

## 2018-06-11 DIAGNOSIS — I159 Secondary hypertension, unspecified: Secondary | ICD-10-CM | POA: Diagnosis not present

## 2018-06-11 LAB — CBC WITH DIFFERENTIAL/PLATELET
BASOS ABS: 0.1 10*3/uL (ref 0.0–0.1)
Basophils Relative: 0.5 % (ref 0.0–3.0)
Eosinophils Absolute: 0 10*3/uL (ref 0.0–0.7)
Eosinophils Relative: 0.3 % (ref 0.0–5.0)
HCT: 33.2 % — ABNORMAL LOW (ref 36.0–46.0)
Hemoglobin: 10.6 g/dL — ABNORMAL LOW (ref 12.0–15.0)
Lymphocytes Relative: 5.3 % — ABNORMAL LOW (ref 12.0–46.0)
Lymphs Abs: 0.6 10*3/uL — ABNORMAL LOW (ref 0.7–4.0)
MCHC: 31.8 g/dL (ref 30.0–36.0)
MCV: 91.5 fl (ref 78.0–100.0)
Monocytes Absolute: 0.5 10*3/uL (ref 0.1–1.0)
Monocytes Relative: 4 % (ref 3.0–12.0)
Neutro Abs: 10.6 10*3/uL — ABNORMAL HIGH (ref 1.4–7.7)
Platelets: 395 10*3/uL (ref 150.0–400.0)
RBC: 3.63 Mil/uL — ABNORMAL LOW (ref 3.87–5.11)
RDW: 20.8 % — ABNORMAL HIGH (ref 11.5–15.5)
WBC: 11.8 10*3/uL — ABNORMAL HIGH (ref 4.0–10.5)

## 2018-06-12 NOTE — Telephone Encounter (Signed)
Ok. Thanks for the input.   Meghan Wise

## 2018-06-12 NOTE — Telephone Encounter (Signed)
Yes I agree with that plan

## 2018-06-13 ENCOUNTER — Encounter (HOSPITAL_COMMUNITY)
Admission: RE | Admit: 2018-06-13 | Discharge: 2018-06-13 | Disposition: A | Discharge status: Home / Self Care | Payer: Self-pay | Source: Ambulatory Visit | Attending: Pulmonary Disease | Admitting: Pulmonary Disease

## 2018-06-18 ENCOUNTER — Encounter (HOSPITAL_COMMUNITY): Payer: Self-pay

## 2018-06-20 ENCOUNTER — Encounter (HOSPITAL_COMMUNITY)
Admission: RE | Admit: 2018-06-20 | Discharge: 2018-06-20 | Disposition: A | Discharge status: Home / Self Care | Payer: Self-pay | Source: Ambulatory Visit | Attending: Pulmonary Disease | Admitting: Pulmonary Disease

## 2018-06-25 ENCOUNTER — Encounter (HOSPITAL_COMMUNITY): Payer: Self-pay

## 2018-06-25 ENCOUNTER — Ambulatory Visit (INDEPENDENT_AMBULATORY_CARE_PROVIDER_SITE_OTHER): Payer: Medicare Other | Admitting: Pulmonary Disease

## 2018-06-25 DIAGNOSIS — I2721 Secondary pulmonary arterial hypertension: Secondary | ICD-10-CM | POA: Insufficient documentation

## 2018-06-25 DIAGNOSIS — J849 Interstitial pulmonary disease, unspecified: Secondary | ICD-10-CM | POA: Insufficient documentation

## 2018-06-25 LAB — PULMONARY FUNCTION TEST
DL/VA % PRED: 44 %
DL/VA: 1.93 ml/min/mmHg/L
DLCO COR: 6.24 ml/min/mmHg
DLCO cor % pred: 43 %
DLCO unc % pred: 39 %
DLCO unc: 5.62 ml/min/mmHg
FEF 25-75 Post: 1.11 L/sec
FEF 25-75 Pre: 0.79 L/sec
FEF2575-%CHANGE-POST: 40 %
FEF2575-%Pred-Post: 111 %
FEF2575-%Pred-Pre: 79 %
FEV1-%Change-Post: 8 %
FEV1-%Pred-Post: 104 %
FEV1-%Pred-Pre: 96 %
FEV1-Post: 1.33 L
FEV1-Pre: 1.22 L
FEV1FVC-%CHANGE-POST: 2 %
FEV1FVC-%Pred-Pre: 97 %
FEV6-%Change-Post: 7 %
FEV6-%PRED-PRE: 103 %
FEV6-%Pred-Post: 111 %
FEV6-Post: 1.81 L
FEV6-Pre: 1.68 L
FEV6FVC-%Change-Post: 0 %
FEV6FVC-%Pred-Post: 107 %
FEV6FVC-%Pred-Pre: 107 %
FVC-%Change-Post: 6 %
FVC-%Pred-Post: 103 %
FVC-%Pred-Pre: 97 %
FVC-Post: 1.82 L
FVC-Pre: 1.7 L
Post FEV1/FVC ratio: 73 %
Post FEV6/FVC ratio: 100 %
Pre FEV1/FVC ratio: 72 %
Pre FEV6/FVC Ratio: 100 %
RV % pred: 101 %
RV: 2.05 L
TLC % pred: 99 %
TLC: 3.89 L

## 2018-06-25 NOTE — Progress Notes (Signed)
PFT done today. 

## 2018-06-27 ENCOUNTER — Telehealth: Payer: Self-pay | Admitting: Pulmonary Disease

## 2018-06-27 ENCOUNTER — Encounter (HOSPITAL_COMMUNITY)
Admission: RE | Admit: 2018-06-27 | Discharge: 2018-06-27 | Disposition: A | Discharge status: Home / Self Care | Payer: Self-pay | Source: Ambulatory Visit | Attending: Pulmonary Disease | Admitting: Pulmonary Disease

## 2018-06-27 NOTE — Telephone Encounter (Signed)
Called and spoke to Meghan Wise regarding PFT results. Meghan Wise verbalized understanding and wanted Dr. Lake Bells to know the Cellcept is still causing diarrhea and she wants to know if there is anything she can do.  Dr. Lake Bells please advise.

## 2018-07-01 NOTE — Telephone Encounter (Signed)
BRAT diet: bananas, rice, applesauce, toast (bland-food diet)   Attempted to call pt but unable to reach. Left message for pt to return call.

## 2018-07-01 NOTE — Telephone Encounter (Signed)
Please describe BRAT diet and to let us know if that doesn't help.

## 2018-07-02 ENCOUNTER — Encounter (HOSPITAL_COMMUNITY)
Admission: RE | Admit: 2018-07-02 | Discharge: 2018-07-02 | Disposition: A | Discharge status: Home / Self Care | Payer: Self-pay | Source: Ambulatory Visit | Attending: Pulmonary Disease | Admitting: Pulmonary Disease

## 2018-07-02 NOTE — Telephone Encounter (Signed)
noted 

## 2018-07-02 NOTE — Telephone Encounter (Signed)
Called and spoke with patient advised her of the response below from BQ. Patient stated that she does not want to do the BRAT diet she will be stopping the medication all together. BQ please advise, thank you.

## 2018-07-03 ENCOUNTER — Other Ambulatory Visit: Payer: Self-pay

## 2018-07-03 ENCOUNTER — Ambulatory Visit (INDEPENDENT_AMBULATORY_CARE_PROVIDER_SITE_OTHER)
Admission: RE | Admit: 2018-07-03 | Discharge: 2018-07-03 | Disposition: A | Discharge status: Home / Self Care | Payer: Medicare Other | Source: Ambulatory Visit | Attending: Pulmonary Disease | Admitting: Pulmonary Disease

## 2018-07-03 DIAGNOSIS — I2721 Secondary pulmonary arterial hypertension: Secondary | ICD-10-CM

## 2018-07-04 ENCOUNTER — Encounter (HOSPITAL_COMMUNITY): Payer: Self-pay

## 2018-07-08 ENCOUNTER — Telehealth (HOSPITAL_COMMUNITY): Payer: Self-pay

## 2018-07-09 ENCOUNTER — Encounter (HOSPITAL_COMMUNITY): Payer: Self-pay

## 2018-07-11 ENCOUNTER — Encounter (HOSPITAL_COMMUNITY): Payer: Self-pay

## 2018-07-16 ENCOUNTER — Telehealth (HOSPITAL_COMMUNITY): Payer: Self-pay

## 2018-07-16 ENCOUNTER — Encounter (HOSPITAL_COMMUNITY): Payer: Self-pay

## 2018-07-18 ENCOUNTER — Encounter (HOSPITAL_COMMUNITY): Payer: Self-pay

## 2018-07-23 ENCOUNTER — Encounter (HOSPITAL_COMMUNITY): Payer: Self-pay

## 2018-07-25 ENCOUNTER — Encounter (HOSPITAL_COMMUNITY): Payer: Self-pay

## 2018-07-30 ENCOUNTER — Encounter (HOSPITAL_COMMUNITY): Payer: Self-pay

## 2018-08-01 ENCOUNTER — Encounter (HOSPITAL_COMMUNITY): Payer: Self-pay

## 2018-08-06 ENCOUNTER — Encounter (HOSPITAL_COMMUNITY): Payer: Self-pay | Admitting: *Deleted

## 2018-08-06 ENCOUNTER — Encounter (HOSPITAL_COMMUNITY): Payer: Self-pay

## 2018-08-06 NOTE — Progress Notes (Signed)
I was able to reach Meghan Wise today to notify her  that the department  is closed indefinitely due to the coronavirus. I instructed her that I would that we would notify her when we reopen. I discussed food security, medications and exercise with her. She states that she has no issues with food or medications. Meghan Wise states that she has been quarantined to her apartment due to an coronavirus exposure so exercise has been difficult. She has been doing chair exercises and walking in her apartment.I encouraged her to continue to do this.

## 2018-08-08 ENCOUNTER — Encounter (HOSPITAL_COMMUNITY): Payer: Self-pay

## 2018-08-08 ENCOUNTER — Ambulatory Visit: Payer: Medicare Other | Admitting: Pulmonary Disease

## 2018-08-13 ENCOUNTER — Encounter (HOSPITAL_COMMUNITY): Payer: Self-pay

## 2018-08-14 ENCOUNTER — Other Ambulatory Visit: Payer: Self-pay | Admitting: Pulmonary Disease

## 2018-08-15 ENCOUNTER — Encounter (HOSPITAL_COMMUNITY): Payer: Self-pay

## 2018-08-20 ENCOUNTER — Encounter (HOSPITAL_COMMUNITY): Payer: Self-pay

## 2018-08-22 ENCOUNTER — Encounter (HOSPITAL_COMMUNITY): Payer: Self-pay

## 2018-08-27 ENCOUNTER — Encounter (HOSPITAL_COMMUNITY): Payer: Self-pay

## 2018-08-29 ENCOUNTER — Encounter (HOSPITAL_COMMUNITY): Payer: Self-pay

## 2018-09-03 ENCOUNTER — Encounter (HOSPITAL_COMMUNITY): Payer: Self-pay

## 2018-09-03 ENCOUNTER — Telehealth (HOSPITAL_COMMUNITY): Payer: Self-pay | Admitting: *Deleted

## 2018-09-05 ENCOUNTER — Encounter (HOSPITAL_COMMUNITY): Payer: Self-pay

## 2018-09-10 ENCOUNTER — Encounter (HOSPITAL_COMMUNITY): Payer: Self-pay

## 2018-09-12 ENCOUNTER — Encounter (HOSPITAL_COMMUNITY): Payer: Self-pay

## 2018-09-17 ENCOUNTER — Encounter (HOSPITAL_COMMUNITY): Payer: Self-pay

## 2018-09-19 ENCOUNTER — Encounter (HOSPITAL_COMMUNITY): Payer: Self-pay

## 2018-09-24 ENCOUNTER — Encounter (HOSPITAL_COMMUNITY): Payer: Self-pay

## 2018-09-26 ENCOUNTER — Encounter (HOSPITAL_COMMUNITY): Payer: Self-pay

## 2018-10-01 ENCOUNTER — Encounter (HOSPITAL_COMMUNITY): Payer: Self-pay

## 2018-10-15 ENCOUNTER — Telehealth (HOSPITAL_COMMUNITY): Payer: Self-pay | Admitting: *Deleted

## 2018-10-15 NOTE — Telephone Encounter (Signed)
LM for patient re: maintenance pulmonary rehab.  The program remains closed due to covid-19 restrictions.  We do not have a date at this time for the maintenance classes to begin, we will contact patient if this changes.

## 2018-12-16 ENCOUNTER — Other Ambulatory Visit: Payer: Self-pay | Admitting: Pulmonary Disease

## 2018-12-25 NOTE — Telephone Encounter (Signed)
Meghan Wise please advise to below email from patient.  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   Thank you!  "I visited my pulmonalogist  at [facility] this week.  I have been having trouble with swelling in my lower legs, ankles, and abdomen.  To alleviate this he prescribed cutting my prednisone from 34m daily to 2.5 mg daily and increasing my torsemide to 3Xwk from twice a week.  He also asked that you weigh in on this change.  If you have problems with this new regimen, please reply. If this meets with your approval, please send new prescriptions to my pharmacy noting the change. Thanks, as always, for your help with this matter. Meghan Wise

## 2018-12-25 NOTE — Telephone Encounter (Signed)
Called and spoke to patient. Reviewed Sarah's recommendations with her.  Patient stated she just had labs done at [facility] and here locally and will bring those results in for OV.  Scheduled patient for OV with NP on 12/27/2018.  Nothing further needed at this time.

## 2018-12-27 ENCOUNTER — Other Ambulatory Visit: Payer: Self-pay

## 2018-12-27 ENCOUNTER — Ambulatory Visit (INDEPENDENT_AMBULATORY_CARE_PROVIDER_SITE_OTHER): Payer: Medicare Other | Admitting: Adult Health

## 2018-12-27 ENCOUNTER — Encounter: Payer: Self-pay | Admitting: Adult Health

## 2018-12-27 DIAGNOSIS — M349 Systemic sclerosis, unspecified: Secondary | ICD-10-CM

## 2018-12-27 DIAGNOSIS — I5032 Chronic diastolic (congestive) heart failure: Secondary | ICD-10-CM

## 2018-12-27 DIAGNOSIS — I272 Pulmonary hypertension, unspecified: Secondary | ICD-10-CM | POA: Diagnosis not present

## 2018-12-27 DIAGNOSIS — J849 Interstitial pulmonary disease, unspecified: Secondary | ICD-10-CM

## 2018-12-27 MED ORDER — PREDNISONE 2.5 MG PO TABS: 2.5000 mg | ORAL_TABLET | Freq: Every day | ORAL | 1 refills | Status: DC

## 2018-12-27 NOTE — Patient Instructions (Addendum)
Continue on prednisone 2.5 mg daily Continue on oxygen 4 L with activity and 2 L at bedtime Get flu shot as planned this month Continue on current pulmonary hypertension regimen per [facility] Follow-up in 4 months with Dr. Vaughan Browner in [facility] clinic with PFT-spirometry with DLCO and 6-minute walk test

## 2018-12-27 NOTE — Progress Notes (Signed)
_0  ID: Meghan Wise, female    DOB: August 13, 1935, 83 y.o.   MRN: [id number]  Chief Complaint  Patient presents with  . Follow-up    ILD     Referring provider: Kelton Pillar, MD  HPI: 83 year old female former smoker followed for scleroderma related ILD, Pulmonary hypertension followed at [facility] hypertension clinic  TEST/EVENTS :  Chest imaging: CT chest 12/12/12: Bilateral irregular pulmonary nodules are all stable. Interstitial thickening most evident at the lung bases. Areas of coarse reticular scarring mostly in anterior upper l9obes and at apices are stable. Borderline mediastinal adenopathy. CT chest 02/23/15 [facility]): 1. Overall the appearance the chest is very similar to the prior examination, with slight progression of disease, as detailed above. Given the mid to upper lung predominance of the nodules and apparent perilymphatic distribution, these findings, in association with some chronic mediastinal and hilar lymphadenopathy is most favored to represent sarcoidosis. Clinical correlation is recommended. No imaging findings to suggest interstitial lung disease at this Time. Air trapping, indicative of small airways disease. Atherosclerosis, including three-vessel coronary artery disease. Assessment for potential risk factor modification, dietary therapy or pharmacologic therapy may be warranted, if clinically indicated.  January 11, 2018 CT chest from [facility]: Basilar predominant groundglass and septal thickening with scattered irregular pulmonary nodules stable to minimally progressed compared to the previous study have been present over several years with only mild progression, favored to be secondary to patient's underlying connective tissue disease stable hilar mediastinal adenopathy, minimal new left lower lobe groundglass opacities likely represent superimposed aspiration  HRCT chest 06/2018 biapical pleural-parenchymal scarring, emphysema, Negative for  ILD.  Scattered postinfectious postinflammatory scarring in the lungs bilaterally residual benign-appearing nodular densities.   PFT: PFT 08/12/12: TLC 108% pred, FVC 105% pred, FEV1 114% pred, FEV1/FVC 73%, FEF25-75% 59% pred, DLCO 48% pred. October 2019 forced vital capacity 1.60 L 91% predicted, total lung capacity 6.88 L 175% predicted, residual volume to 43% predicted, DLCO 6.1 mL 42% predicted  Labs:  Path:  6 Min walk 02/2018> could not complete, walked 3 minutes, made it 68 m total, O2 saturation was 93% nadir on 4L [place]  Echo: TTE 09/26/12: LVEF 60-65%, grade 1 LVDD. Mild AR. Trivial MR. Mild biatrial enlargement. RV mildly enlarged. RVSP estimated at 44 mmHg. IVC normal in size with normal respiratory collapse. TTE 05/05/13: LVEF > 55%, grade 1 LVDD, septal flattening. Severely enlarged RV, moderate dysfunction, RA moderately enlarged, IVC normal in size with abnormal respiratory collapse. Moderate TR, TRjet 4.1 m/s, RVSP 75 mmHg. February 2019 echocardiogram from [facility] normal LV systolic function, normal right ventricular systolic function, no valvular stenosis, RVSP 57 mmHg  Other imaging: 01/2018 modified barium swallow> no difficulty with swallowing, suspect primary esophageal dysphagia  Heart Catheterization: January 11, 2018 right heart catheterization from [facility] right atrial 8, PA pressure 72/32 mean 42 wedge pressure 14, cardiac index 3.4 L/min/m, PVR 6 [place] units RHC 05/29/13: RA 13, RV 68/15, PA 72/28 (mean 45), PCWP 7, CO 3.5, CI 2.4, PVR 9.5 WU. No significant response to nitric oxide.   12/27/2018 Follow up : Scleroderma related ILD , Pulmonary HTN  Patient presents for a follow-up.  Last seen January 2020, at that visit was started on CellCept for suspected worsening ILD and declining DLCO .  Follow-up CT chest July 03, 2018 showed no fibrotic interstitial lung disease.  Scattered postinfectious inflammatory scarring with benign appearing  nodular densities. PFTs March 2020 showed normal lung function with no airflow obstruction or  restriction but worsening diffusing capacity (FEV1 104%, ratio 73, FVC 103%, no significant bronchodilator response, DLCO 39%.)  Previous PFTs in October 2019 DLCO 42% She has been following with [facility] pulmonary HTN clinic she has been tapered to prednisone 2.5 mg daily due to fluid retention this week.  Recently torsemide was increased to 10 mg 3 times a week for worsening lower extremity edema and elevated BNP.  Lab work on 8/31 showed creatinine at 1.2 LFTs normal hemoglobin 11.8 WBC 10.5 BNP 1191, Spirometry showed 82%, ratio 63, FVC 95% DLCO 28% Since last visit patient says she is doing better . Feels her breathing is slightly improved and she is some better.  She was unable to tolerate CellCept due to severe GI issues despite lower dosing options.  Remains on oxygen 4 l/m with activity , none at rest and 2l/m At bedtime     Allergies  Allergen Reactions  . Losartan Other (See Comments) and Cough    High calcium count and renal problems  . Ace Inhibitors Other (See Comments) and Cough    Dizziness and coughing   . Sulfa Antibiotics Hives  . Codeine Nausea Only  . Heparin Anxiety and Other (See Comments)    Pt reports that they have a sensitivity towards heparin. Pt becomes depressed and anxious to the point of crying uncontrollably.     Immunization History  Administered Date(s) Administered  . Influenza Split 01/23/2012, 01/22/2013  . Influenza, High Dose Seasonal PF 01/23/2018    Past Medical History:  Diagnosis Date  . Heart failure ([place])   . HTN (hypertension)   . Hyperlipidemia   . Hypothyroidism   . Kidney disease    Stage IV- Dr. Servando Salina -LOV 02-11-14  . Osteoarthritis   . Osteoporosis   . Pulmonary hypertension ([place])   . Raynaud disease    reactive with cold and stress mostly fingers.  . Scleroderma ([place])    lungs and kidneys  . Shortness of breath dyspnea    with  exertion-in Cardiac rehab program at [facility].    Tobacco History: Social History   Tobacco Use  Smoking Status Former Smoker  . Packs/day: 1.50  . Years: 60.00  . Pack years: 90.00  . Types: Cigarettes  . Quit date: 04/25/1987  . Years since quitting: 31.6  Smokeless Tobacco Never Used   Counseling given: Not Answered   Outpatient Medications Prior to Visit  Medication Sig Dispense Refill  . allopurinol (ZYLOPRIM) 100 MG tablet Take 100 mg by mouth daily.  0  . ambrisentan (LETAIRIS) 10 MG tablet Take 10 mg by mouth every morning.     Marland Kitchen amLODipine (NORVASC) 5 MG tablet Take 5 mg by mouth every evening.     . Cholecalciferol (VITAMIN D3) 2000 units capsule Take 2,000 Units by mouth daily.    Marland Kitchen denosumab (PROLIA) 60 MG/ML SOSY injection Inject 60 mg into the skin every 6 (six) months.    . esomeprazole (NEXIUM) 20 MG capsule Take 20 mg by mouth daily at 12 noon.    . folic acid (FOLVITE) 315 MCG tablet Take 800 mcg by mouth every evening.     Marland Kitchen levothyroxine (SYNTHROID, LEVOTHROID) 50 MCG tablet Take 50 mcg by mouth every morning.    . Liniments (SALONPAS ARTHRITIS PAIN RELIEF EX) Apply 1 application topically daily as needed (pain).    Marland Kitchen loperamide (IMODIUM A-D) 2 MG tablet Take 2 mg by mouth 4 (four) times daily as needed for diarrhea or loose stools.    Marland Kitchen  Melatonin 10 MG TABS Take 1 tablet by mouth 3 times/day as needed-between meals & bedtime (sleep).     . OXYGEN Inhale 2-4 L into the lungs at bedtime. 4 L with exertion 2 L at night    . predniSONE (DELTASONE) 5 MG tablet TAKE 1.5 TABLETS (7.5 MG TOTAL) BY MOUTH DAILY. 45 tablet 2  . tadalafil (ADCIRCA/CIALIS) 20 MG tablet Take 40 mg by mouth every evening.     . torsemide (DEMADEX) 20 MG tablet Take 10 mg by mouth See admin instructions. Takes mon and fri only    . acetaminophen (TYLENOL) 500 MG tablet Take 1,000 mg by mouth every morning.    Marland Kitchen atovaquone (MEPRON) 750 MG/5ML suspension Take 10 mLs (1,500 mg total) by mouth daily  with breakfast. (Patient not taking: Reported on 12/27/2018) 300 mL 5  . fluticasone (FLONASE) 50 MCG/ACT nasal spray Place 1 spray into both nostrils daily.   1  . mycophenolate (CELLCEPT) 250 MG capsule Take 3 capsules (750 mg total) by mouth 2 (two) times daily. (Patient not taking: Reported on 12/27/2018) 180 capsule 5  . omeprazole (PRILOSEC) 20 MG capsule Take 20 mg by mouth daily.     No facility-administered medications prior to visit.      Review of Systems:   Constitutional:   No  weight loss, night sweats,  Fevers, chills,  +fatigue, or  lassitude.  HEENT:   No headaches,  Difficulty swallowing,  Tooth/dental problems, or  Sore throat,                No sneezing, itching, ear ache, nasal congestion, post nasal drip,   CV:  No chest pain,  Orthopnea, PND,++swelling in lower extremities, anasarca, dizziness, palpitations, syncope.   GI  No heartburn, indigestion, abdominal pain, nausea, vomiting, diarrhea, change in bowel habits, loss of appetite, bloody stools.   Resp:    No chest wall deformity  Skin: no rash or lesions.  GU: no dysuria, change in color of urine, no urgency or frequency.  No flank pain, no hematuria   MS:  No joint pain or swelling.  No decreased range of motion.  No back pain.    Physical Exam  BP 134/62 (BP Location: Left Arm, Cuff Size: Normal)   Pulse 81   Temp 98.7 F (37.1 C) (Temporal)   Ht _0  (1.473 m)   Wt 112 lb 9.6 oz (51.1 kg)   SpO2 93%   BMI 23.53 kg/m   GEN: A/Ox3; pleasant , NAD, well nourished    HEENT:  [place]/AT,  EACs-clear, TMs-wnl, NOSE-clear, THROAT-clear, no lesions, no postnasal drip or exudate noted.   NECK:  Supple w/ fair ROM; no JVD; normal carotid impulses w/o bruits; no thyromegaly or nodules palpated; no lymphadenopathy.    RESP  Clear  P & A; w/o, wheezes/ rales/ or rhonchi. no accessory muscle use, no dullness to percussion  CARD:  RRR, 2/6 SM 2+  peripheral edema, pulses intact, no cyanosis or clubbing.   GI:   Soft & nt; nml bowel sounds; no organomegaly or masses detected.   Musco: Warm bil, no deformities or joint swelling noted.   Neuro: alert, no focal deficits noted.    Skin: Warm, no lesions or rashes    Lab Results:  CBC  BMET   BNP  Imaging: No results found.    PFT Results Latest Ref Rng & Units 06/25/2018 02/11/2018  FVC-Pre L 1.70 1.60  FVC-Predicted Pre % 97 91  FVC-Post L  1.82 1.56  FVC-Predicted Post % 103 89  Pre FEV1/FVC % % 72 70  Post FEV1/FCV % % 73 69  FEV1-Pre L 1.22 1.11  FEV1-Predicted Pre % 96 87  FEV1-Post L 1.33 1.08  DLCO UNC% % 39 42  DLCO COR %Predicted % 44 58  TLC L 3.89 6.88  TLC % Predicted % 99 175  RV % Predicted % 101 243    No results found for: NITRICOXIDE      Assessment & Plan:   ILD (interstitial lung disease) ([place]) ILD-scleroderma related Most recent CT chest March 2020 showed stable changes with scattered postinfectious/postinflammatory scarring and benign-appearing nodular densities. PFTs do show a decline in DLCO.  However patient is clinically stable with no increased oxygen demands. She was unable to tolerate CellCept even at lowest dosing. From a pulmonary hypertension standpoint care everywhere notes from [facility] was reviewed and patient appeared somewhat stable.  Diuretics was adjusted to help with lower extremity edema and slightly decreased RV function.  For now we will continue on current regimen.  Will check a spirometry with DLCO and 6-minute walk test on return in 4 months.  Plan  Patient Instructions  Continue on prednisone 2.5 mg daily Continue on oxygen 4 L with activity and 2 L at bedtime Get flu shot as planned this month Continue on current pulmonary hypertension regimen per [facility] Follow-up in 4 months with Dr. Vaughan Browner in ILD clinic with PFT-spirometry with DLCO and 6-minute walk test      Chronic diastolic CHF (congestive heart failure), NYHA class 4 ([place]) Appears compensated.   Patient is continue on diuretics.  She does have chronic kidney disease which limits her ability to be aggressively diuresed.  Does not appear to be acutely volume overloaded on exam  Pulmonary hypertension ([place]) Continue on current regimen Continue follow-up with [facility] pulmonary retention clinic  Scleroderma ([place]) Continue on current regimen.  Continue to follow with rheumatology.  Prednisone has been lowered to 2.5 mg daily continue to follow closely. Unable to tolerate CellCept     Rexene Edison, NP 12/27/2018

## 2018-12-27 NOTE — Assessment & Plan Note (Signed)
ILD-scleroderma related Most recent CT chest March 2020 showed stable changes with scattered postinfectious/postinflammatory scarring and benign-appearing nodular densities. PFTs do show a decline in DLCO.  However patient is clinically stable with no increased oxygen demands. She was unable to tolerate CellCept even at lowest dosing. From a pulmonary hypertension standpoint care everywhere notes from [facility] was reviewed and patient appeared somewhat stable.  Diuretics was adjusted to help with lower extremity edema and slightly decreased RV function.  For now we will continue on current regimen.  Will check a spirometry with DLCO and 6-minute walk test on return in 4 months.  Plan  Patient Instructions  Continue on prednisone 2.5 mg daily Continue on oxygen 4 L with activity and 2 L at bedtime Get flu shot as planned this month Continue on current pulmonary hypertension regimen per [facility] Follow-up in 4 months with Dr. Vaughan Browner in ILD clinic with PFT-spirometry with DLCO and 6-minute walk test

## 2018-12-27 NOTE — Assessment & Plan Note (Signed)
Continue on current regimen.  Continue to follow with rheumatology.  Prednisone has been lowered to 2.5 mg daily continue to follow closely. Unable to tolerate CellCept

## 2018-12-27 NOTE — Addendum Note (Signed)
Addended by: Parke Poisson E on: 12/27/2018 04:05 PM   Modules accepted: Orders

## 2018-12-27 NOTE — Assessment & Plan Note (Signed)
Appears compensated.  Patient is continue on diuretics.  She does have chronic kidney disease which limits her ability to be aggressively diuresed.  Does not appear to be acutely volume overloaded on exam

## 2018-12-27 NOTE — Assessment & Plan Note (Signed)
Continue on current regimen Continue follow-up with [facility] pulmonary retention clinic

## 2018-12-31 ENCOUNTER — Other Ambulatory Visit: Payer: Self-pay | Admitting: Family Medicine

## 2018-12-31 DIAGNOSIS — Z1231 Encounter for screening mammogram for malignant neoplasm of breast: Secondary | ICD-10-CM

## 2018-12-31 NOTE — Progress Notes (Signed)
Reviewed, agree 

## 2019-01-14 NOTE — Telephone Encounter (Signed)
Tried to call patient to discuss , [id number]  That is fine to go up on Prednisone 78m daily , if O2 sats not improving to previous will need to call back  OV or Virtual visit next week. (in person visit would be better so labs could be done )  Continue on Torsemide at same dose but please call [facility] HTN clinic back and let them know leg swelling no better .   Please contact office for sooner follow up if symptoms do not improve or worsen or seek emergency care    Make sure no fever, worsening hypoxia (O2 sats <90%) , anorexia/nv/d/, orthopnea, etc

## 2019-01-14 NOTE — Telephone Encounter (Signed)
Pt sent the following MyChart message addressed to TP 01/14/2019 2:04 PM EDT:  "At my last appointment with Dr. Daine Gravel, he reduced my prednisone to 2.5 milligrams from 5 mg.  and increased my torsimide to 3Xwk. I started the new regimen on 12/24/18.  Since that time, my need for oxygen has increased, I have developed a dry cough and I feel much more short of breath.  As I understand,  the reason for this medication change was to reduce swelling in my abdomen and legs.  The swelling has not decreased since I started the new regimen.   Given these indications, I propose going back on the 5 mg. of prednisone.  I'm not certain if we need to go back to 2Xwk of the torsimide  or not.  Please advise."  TP, please advise with your recommendations for this pt. Thank you.

## 2019-01-14 NOTE — Telephone Encounter (Signed)
TP, would you rather pt schedule an appt with your or a new patient appt with Dr. Vaughan Browner? Please advise, thank you.

## 2019-01-14 NOTE — Telephone Encounter (Signed)
Sent response MyChart message to pt with TP's response. Will await pt's reply to create a f/u appt for 1 week out per TP.

## 2019-02-12 ENCOUNTER — Other Ambulatory Visit: Payer: Self-pay

## 2019-02-12 ENCOUNTER — Ambulatory Visit
Admission: RE | Admit: 2019-02-12 | Discharge: 2019-02-12 | Disposition: A | Discharge status: Home / Self Care | Payer: Medicare Other | Source: Ambulatory Visit | Attending: Family Medicine | Admitting: Family Medicine

## 2019-02-12 DIAGNOSIS — Z1231 Encounter for screening mammogram for malignant neoplasm of breast: Secondary | ICD-10-CM

## 2019-07-09 ENCOUNTER — Encounter (INDEPENDENT_AMBULATORY_CARE_PROVIDER_SITE_OTHER): Payer: Medicare Other | Admitting: Ophthalmology

## 2019-07-09 ENCOUNTER — Encounter (INDEPENDENT_AMBULATORY_CARE_PROVIDER_SITE_OTHER): Payer: Self-pay | Admitting: Ophthalmology

## 2019-07-09 DIAGNOSIS — H353122 Nonexudative age-related macular degeneration, left eye, intermediate dry stage: Secondary | ICD-10-CM | POA: Diagnosis not present

## 2019-07-09 DIAGNOSIS — H59031 Cystoid macular edema following cataract surgery, right eye: Secondary | ICD-10-CM

## 2019-07-09 DIAGNOSIS — H35371 Puckering of macula, right eye: Secondary | ICD-10-CM | POA: Diagnosis not present

## 2019-07-09 DIAGNOSIS — H43813 Vitreous degeneration, bilateral: Secondary | ICD-10-CM

## 2019-07-09 DIAGNOSIS — H35033 Hypertensive retinopathy, bilateral: Secondary | ICD-10-CM

## 2019-07-09 DIAGNOSIS — I10 Essential (primary) hypertension: Secondary | ICD-10-CM

## 2019-08-20 ENCOUNTER — Encounter (INDEPENDENT_AMBULATORY_CARE_PROVIDER_SITE_OTHER): Payer: Medicare PPO | Admitting: Ophthalmology

## 2019-08-20 DIAGNOSIS — H35371 Puckering of macula, right eye: Secondary | ICD-10-CM | POA: Diagnosis not present

## 2019-08-20 DIAGNOSIS — H35033 Hypertensive retinopathy, bilateral: Secondary | ICD-10-CM | POA: Diagnosis not present

## 2019-08-20 DIAGNOSIS — H59031 Cystoid macular edema following cataract surgery, right eye: Secondary | ICD-10-CM | POA: Diagnosis not present

## 2019-08-20 DIAGNOSIS — H43813 Vitreous degeneration, bilateral: Secondary | ICD-10-CM

## 2019-08-20 DIAGNOSIS — I10 Essential (primary) hypertension: Secondary | ICD-10-CM | POA: Diagnosis not present

## 2019-09-16 DIAGNOSIS — H40051 Ocular hypertension, right eye: Secondary | ICD-10-CM | POA: Diagnosis not present

## 2019-09-16 DIAGNOSIS — H401131 Primary open-angle glaucoma, bilateral, mild stage: Secondary | ICD-10-CM | POA: Diagnosis not present

## 2019-09-29 DIAGNOSIS — Z87891 Personal history of nicotine dependence: Secondary | ICD-10-CM | POA: Diagnosis not present

## 2019-09-29 DIAGNOSIS — M349 Systemic sclerosis, unspecified: Secondary | ICD-10-CM | POA: Diagnosis not present

## 2019-09-29 DIAGNOSIS — D500 Iron deficiency anemia secondary to blood loss (chronic): Secondary | ICD-10-CM | POA: Diagnosis not present

## 2019-09-29 DIAGNOSIS — R0602 Shortness of breath: Secondary | ICD-10-CM | POA: Diagnosis not present

## 2019-09-29 DIAGNOSIS — I088 Other rheumatic multiple valve diseases: Secondary | ICD-10-CM | POA: Diagnosis not present

## 2019-09-29 DIAGNOSIS — I2729 Other secondary pulmonary hypertension: Secondary | ICD-10-CM | POA: Diagnosis not present

## 2019-10-01 ENCOUNTER — Encounter (INDEPENDENT_AMBULATORY_CARE_PROVIDER_SITE_OTHER): Payer: Medicare PPO | Admitting: Ophthalmology

## 2019-10-01 ENCOUNTER — Other Ambulatory Visit: Payer: Self-pay

## 2019-10-01 DIAGNOSIS — H35033 Hypertensive retinopathy, bilateral: Secondary | ICD-10-CM | POA: Diagnosis not present

## 2019-10-01 DIAGNOSIS — I10 Essential (primary) hypertension: Secondary | ICD-10-CM | POA: Diagnosis not present

## 2019-10-01 DIAGNOSIS — H35371 Puckering of macula, right eye: Secondary | ICD-10-CM | POA: Diagnosis not present

## 2019-10-01 DIAGNOSIS — H59031 Cystoid macular edema following cataract surgery, right eye: Secondary | ICD-10-CM

## 2019-10-01 DIAGNOSIS — H43813 Vitreous degeneration, bilateral: Secondary | ICD-10-CM | POA: Diagnosis not present

## 2019-10-08 DIAGNOSIS — N1832 Chronic kidney disease, stage 3b: Secondary | ICD-10-CM | POA: Diagnosis not present

## 2019-10-08 DIAGNOSIS — D509 Iron deficiency anemia, unspecified: Secondary | ICD-10-CM | POA: Diagnosis not present

## 2019-10-15 DIAGNOSIS — I2721 Secondary pulmonary arterial hypertension: Secondary | ICD-10-CM | POA: Diagnosis not present

## 2019-10-20 ENCOUNTER — Other Ambulatory Visit (HOSPITAL_COMMUNITY): Payer: Self-pay | Admitting: *Deleted

## 2019-10-21 ENCOUNTER — Encounter (HOSPITAL_COMMUNITY): Payer: Self-pay

## 2019-10-24 ENCOUNTER — Other Ambulatory Visit (HOSPITAL_COMMUNITY): Payer: Self-pay | Admitting: *Deleted

## 2019-10-24 NOTE — Discharge Instructions (Signed)

## 2019-10-28 ENCOUNTER — Other Ambulatory Visit: Payer: Self-pay

## 2019-10-28 ENCOUNTER — Encounter (HOSPITAL_COMMUNITY)
Admission: RE | Admit: 2019-10-28 | Discharge: 2019-10-28 | Disposition: A | Discharge status: Home / Self Care | Payer: Medicare PPO | Source: Ambulatory Visit | Attending: Nephrology | Admitting: Nephrology

## 2019-10-28 DIAGNOSIS — D631 Anemia in chronic kidney disease: Secondary | ICD-10-CM | POA: Insufficient documentation

## 2019-10-28 DIAGNOSIS — N189 Chronic kidney disease, unspecified: Secondary | ICD-10-CM | POA: Insufficient documentation

## 2019-10-28 MED ORDER — SODIUM CHLORIDE 0.9 % IV SOLN
510.0000 mg | INTRAVENOUS | Status: DC
  Administered 2019-10-28: 510 mg via INTRAVENOUS
  Filled 2019-10-28: qty 17

## 2019-11-04 ENCOUNTER — Ambulatory Visit (HOSPITAL_COMMUNITY)
Admission: RE | Admit: 2019-11-04 | Discharge: 2019-11-04 | Disposition: A | Discharge status: Home / Self Care | Payer: Medicare PPO | Source: Ambulatory Visit | Attending: Nephrology | Admitting: Nephrology

## 2019-11-04 ENCOUNTER — Other Ambulatory Visit: Payer: Self-pay

## 2019-11-04 DIAGNOSIS — N189 Chronic kidney disease, unspecified: Secondary | ICD-10-CM | POA: Insufficient documentation

## 2019-11-04 DIAGNOSIS — D631 Anemia in chronic kidney disease: Secondary | ICD-10-CM | POA: Diagnosis not present

## 2019-11-04 MED ORDER — SODIUM CHLORIDE 0.9 % IV SOLN
510.0000 mg | INTRAVENOUS | Status: AC
  Administered 2019-11-04: 510 mg via INTRAVENOUS
  Filled 2019-11-04: qty 17

## 2019-11-06 DIAGNOSIS — I2721 Secondary pulmonary arterial hypertension: Secondary | ICD-10-CM | POA: Diagnosis not present

## 2019-11-11 DIAGNOSIS — I129 Hypertensive chronic kidney disease with stage 1 through stage 4 chronic kidney disease, or unspecified chronic kidney disease: Secondary | ICD-10-CM | POA: Diagnosis not present

## 2019-11-11 DIAGNOSIS — D631 Anemia in chronic kidney disease: Secondary | ICD-10-CM | POA: Diagnosis not present

## 2019-11-11 DIAGNOSIS — N1832 Chronic kidney disease, stage 3b: Secondary | ICD-10-CM | POA: Diagnosis not present

## 2019-11-11 DIAGNOSIS — D539 Nutritional anemia, unspecified: Secondary | ICD-10-CM | POA: Diagnosis not present

## 2019-11-11 DIAGNOSIS — D649 Anemia, unspecified: Secondary | ICD-10-CM | POA: Diagnosis not present

## 2019-11-18 DIAGNOSIS — H401131 Primary open-angle glaucoma, bilateral, mild stage: Secondary | ICD-10-CM | POA: Diagnosis not present

## 2019-11-18 DIAGNOSIS — H35371 Puckering of macula, right eye: Secondary | ICD-10-CM | POA: Diagnosis not present

## 2019-11-18 DIAGNOSIS — H40051 Ocular hypertension, right eye: Secondary | ICD-10-CM | POA: Diagnosis not present

## 2019-11-21 DIAGNOSIS — Z03818 Encounter for observation for suspected exposure to other biological agents ruled out: Secondary | ICD-10-CM | POA: Diagnosis not present

## 2019-11-21 DIAGNOSIS — Z20828 Contact with and (suspected) exposure to other viral communicable diseases: Secondary | ICD-10-CM | POA: Diagnosis not present

## 2019-12-04 DIAGNOSIS — I2721 Secondary pulmonary arterial hypertension: Secondary | ICD-10-CM | POA: Diagnosis not present

## 2019-12-30 DIAGNOSIS — I2721 Secondary pulmonary arterial hypertension: Secondary | ICD-10-CM | POA: Diagnosis not present

## 2020-01-13 ENCOUNTER — Other Ambulatory Visit: Payer: Self-pay | Admitting: Family Medicine

## 2020-01-13 DIAGNOSIS — Z1231 Encounter for screening mammogram for malignant neoplasm of breast: Secondary | ICD-10-CM

## 2020-01-15 ENCOUNTER — Other Ambulatory Visit: Payer: Self-pay | Admitting: Family Medicine

## 2020-01-15 DIAGNOSIS — Z1389 Encounter for screening for other disorder: Secondary | ICD-10-CM | POA: Diagnosis not present

## 2020-01-15 DIAGNOSIS — D509 Iron deficiency anemia, unspecified: Secondary | ICD-10-CM | POA: Diagnosis not present

## 2020-01-15 DIAGNOSIS — I700 Atherosclerosis of aorta: Secondary | ICD-10-CM | POA: Diagnosis not present

## 2020-01-15 DIAGNOSIS — I129 Hypertensive chronic kidney disease with stage 1 through stage 4 chronic kidney disease, or unspecified chronic kidney disease: Secondary | ICD-10-CM | POA: Diagnosis not present

## 2020-01-15 DIAGNOSIS — Z0000 Encounter for general adult medical examination without abnormal findings: Secondary | ICD-10-CM | POA: Diagnosis not present

## 2020-01-15 DIAGNOSIS — E7800 Pure hypercholesterolemia, unspecified: Secondary | ICD-10-CM | POA: Diagnosis not present

## 2020-01-15 DIAGNOSIS — I7300 Raynaud's syndrome without gangrene: Secondary | ICD-10-CM | POA: Diagnosis not present

## 2020-01-15 DIAGNOSIS — M810 Age-related osteoporosis without current pathological fracture: Secondary | ICD-10-CM | POA: Diagnosis not present

## 2020-01-15 DIAGNOSIS — E039 Hypothyroidism, unspecified: Secondary | ICD-10-CM | POA: Diagnosis not present

## 2020-01-19 DIAGNOSIS — D509 Iron deficiency anemia, unspecified: Secondary | ICD-10-CM | POA: Diagnosis not present

## 2020-01-21 DIAGNOSIS — M109 Gout, unspecified: Secondary | ICD-10-CM | POA: Diagnosis not present

## 2020-01-21 DIAGNOSIS — M349 Systemic sclerosis, unspecified: Secondary | ICD-10-CM | POA: Diagnosis not present

## 2020-01-21 DIAGNOSIS — I7300 Raynaud's syndrome without gangrene: Secondary | ICD-10-CM | POA: Diagnosis not present

## 2020-01-21 DIAGNOSIS — M810 Age-related osteoporosis without current pathological fracture: Secondary | ICD-10-CM | POA: Diagnosis not present

## 2020-01-21 DIAGNOSIS — M1990 Unspecified osteoarthritis, unspecified site: Secondary | ICD-10-CM | POA: Diagnosis not present

## 2020-01-21 DIAGNOSIS — M3501 Sicca syndrome with keratoconjunctivitis: Secondary | ICD-10-CM | POA: Diagnosis not present

## 2020-01-21 DIAGNOSIS — G500 Trigeminal neuralgia: Secondary | ICD-10-CM | POA: Diagnosis not present

## 2020-01-21 DIAGNOSIS — M341 CR(E)ST syndrome: Secondary | ICD-10-CM | POA: Diagnosis not present

## 2020-01-21 DIAGNOSIS — J849 Interstitial pulmonary disease, unspecified: Secondary | ICD-10-CM | POA: Diagnosis not present

## 2020-01-23 ENCOUNTER — Other Ambulatory Visit: Payer: Self-pay

## 2020-01-23 ENCOUNTER — Ambulatory Visit
Admission: RE | Admit: 2020-01-23 | Discharge: 2020-01-23 | Disposition: A | Discharge status: Home / Self Care | Payer: Medicare PPO | Source: Ambulatory Visit | Attending: Family Medicine | Admitting: Family Medicine

## 2020-01-23 DIAGNOSIS — Z780 Asymptomatic menopausal state: Secondary | ICD-10-CM | POA: Diagnosis not present

## 2020-01-23 DIAGNOSIS — M810 Age-related osteoporosis without current pathological fracture: Secondary | ICD-10-CM

## 2020-01-26 DIAGNOSIS — I2729 Other secondary pulmonary hypertension: Secondary | ICD-10-CM | POA: Diagnosis not present

## 2020-01-26 DIAGNOSIS — R197 Diarrhea, unspecified: Secondary | ICD-10-CM | POA: Diagnosis not present

## 2020-01-26 DIAGNOSIS — R0602 Shortness of breath: Secondary | ICD-10-CM | POA: Diagnosis not present

## 2020-01-26 DIAGNOSIS — M349 Systemic sclerosis, unspecified: Secondary | ICD-10-CM | POA: Diagnosis not present

## 2020-01-26 DIAGNOSIS — D649 Anemia, unspecified: Secondary | ICD-10-CM | POA: Diagnosis not present

## 2020-01-26 DIAGNOSIS — Z87891 Personal history of nicotine dependence: Secondary | ICD-10-CM | POA: Diagnosis not present

## 2020-01-26 DIAGNOSIS — Z79899 Other long term (current) drug therapy: Secondary | ICD-10-CM | POA: Diagnosis not present

## 2020-01-26 DIAGNOSIS — R600 Localized edema: Secondary | ICD-10-CM | POA: Diagnosis not present

## 2020-01-29 DIAGNOSIS — I2721 Secondary pulmonary arterial hypertension: Secondary | ICD-10-CM | POA: Diagnosis not present

## 2020-02-06 DIAGNOSIS — I270 Primary pulmonary hypertension: Secondary | ICD-10-CM | POA: Diagnosis not present

## 2020-02-13 ENCOUNTER — Ambulatory Visit
Admission: RE | Admit: 2020-02-13 | Discharge: 2020-02-13 | Disposition: A | Discharge status: Home / Self Care | Payer: Medicare PPO | Source: Ambulatory Visit | Attending: Family Medicine | Admitting: Family Medicine

## 2020-02-13 ENCOUNTER — Other Ambulatory Visit: Payer: Self-pay

## 2020-02-13 DIAGNOSIS — Z1231 Encounter for screening mammogram for malignant neoplasm of breast: Secondary | ICD-10-CM | POA: Diagnosis not present

## 2020-02-18 DIAGNOSIS — R531 Weakness: Secondary | ICD-10-CM | POA: Diagnosis not present

## 2020-02-18 DIAGNOSIS — M340 Progressive systemic sclerosis: Secondary | ICD-10-CM | POA: Diagnosis not present

## 2020-02-18 DIAGNOSIS — E860 Dehydration: Secondary | ICD-10-CM | POA: Diagnosis not present

## 2020-02-18 DIAGNOSIS — R197 Diarrhea, unspecified: Secondary | ICD-10-CM | POA: Diagnosis not present

## 2020-02-19 ENCOUNTER — Emergency Department (HOSPITAL_COMMUNITY)
Admission: EM | Admit: 2020-02-19 | Discharge: 2020-02-19 | Disposition: A | Discharge status: Home / Self Care | Payer: Medicare PPO | Attending: Emergency Medicine | Admitting: Emergency Medicine

## 2020-02-19 ENCOUNTER — Encounter (HOSPITAL_COMMUNITY): Payer: Self-pay

## 2020-02-19 ENCOUNTER — Emergency Department (HOSPITAL_COMMUNITY): Admission: EM | Admit: 2020-02-19 | Discharge: 2020-02-19 | Discharge status: Absent without leave | Payer: Self-pay

## 2020-02-19 ENCOUNTER — Other Ambulatory Visit: Payer: Self-pay

## 2020-02-19 DIAGNOSIS — I5032 Chronic diastolic (congestive) heart failure: Secondary | ICD-10-CM | POA: Insufficient documentation

## 2020-02-19 DIAGNOSIS — Z87891 Personal history of nicotine dependence: Secondary | ICD-10-CM | POA: Insufficient documentation

## 2020-02-19 DIAGNOSIS — I130 Hypertensive heart and chronic kidney disease with heart failure and stage 1 through stage 4 chronic kidney disease, or unspecified chronic kidney disease: Secondary | ICD-10-CM | POA: Diagnosis not present

## 2020-02-19 DIAGNOSIS — E860 Dehydration: Secondary | ICD-10-CM | POA: Diagnosis not present

## 2020-02-19 DIAGNOSIS — Z79899 Other long term (current) drug therapy: Secondary | ICD-10-CM | POA: Diagnosis not present

## 2020-02-19 DIAGNOSIS — R197 Diarrhea, unspecified: Secondary | ICD-10-CM | POA: Diagnosis present

## 2020-02-19 DIAGNOSIS — E039 Hypothyroidism, unspecified: Secondary | ICD-10-CM | POA: Insufficient documentation

## 2020-02-19 DIAGNOSIS — N184 Chronic kidney disease, stage 4 (severe): Secondary | ICD-10-CM | POA: Diagnosis not present

## 2020-02-19 DIAGNOSIS — N179 Acute kidney failure, unspecified: Secondary | ICD-10-CM | POA: Insufficient documentation

## 2020-02-19 LAB — CBC
HCT: 35.2 % — ABNORMAL LOW (ref 36.0–46.0)
Hemoglobin: 11.6 g/dL — ABNORMAL LOW (ref 12.0–15.0)
MCH: 32.6 pg (ref 26.0–34.0)
MCHC: 33 g/dL (ref 30.0–36.0)
MCV: 98.9 fL (ref 80.0–100.0)
Platelets: 391 10*3/uL (ref 150–400)
RBC: 3.56 MIL/uL — ABNORMAL LOW (ref 3.87–5.11)
RDW: 14.6 % (ref 11.5–15.5)
WBC: 10 10*3/uL (ref 4.0–10.5)
nRBC: 0 % (ref 0.0–0.2)

## 2020-02-19 LAB — LIPASE, BLOOD: Lipase: 27 U/L (ref 11–51)

## 2020-02-19 LAB — COMPREHENSIVE METABOLIC PANEL
ALT: 15 U/L (ref 0–44)
AST: 24 U/L (ref 15–41)
Albumin: 3.4 g/dL — ABNORMAL LOW (ref 3.5–5.0)
Alkaline Phosphatase: 76 U/L (ref 38–126)
Anion gap: 14 (ref 5–15)
BUN: 48 mg/dL — ABNORMAL HIGH (ref 8–23)
CO2: 22 mmol/L (ref 22–32)
Calcium: 7.9 mg/dL — ABNORMAL LOW (ref 8.9–10.3)
Chloride: 103 mmol/L (ref 98–111)
Creatinine, Ser: 2.14 mg/dL — ABNORMAL HIGH (ref 0.44–1.00)
GFR, Estimated: 23 mL/min — ABNORMAL LOW (ref >60)
Glucose, Bld: 99 mg/dL (ref 70–99)
Potassium: 3.5 mmol/L (ref 3.5–5.1)
Sodium: 139 mmol/L (ref 135–145)
Total Bilirubin: 0.4 mg/dL (ref 0.3–1.2)
Total Protein: 7.1 g/dL (ref 6.5–8.1)

## 2020-02-19 LAB — POC OCCULT BLOOD, ED: Fecal Occult Bld: NEGATIVE

## 2020-02-19 LAB — URINALYSIS, ROUTINE W REFLEX MICROSCOPIC
Bilirubin Urine: NEGATIVE
Glucose, UA: NEGATIVE mg/dL
Hgb urine dipstick: NEGATIVE
Ketones, ur: NEGATIVE mg/dL
Leukocytes,Ua: NEGATIVE
Nitrite: NEGATIVE
Protein, ur: NEGATIVE mg/dL
Specific Gravity, Urine: 1.005 (ref 1.005–1.030)
pH: 6 (ref 5.0–8.0)

## 2020-02-19 MED ORDER — SODIUM CHLORIDE 0.9 % IV BOLUS
1000.0000 mL | Freq: Once | INTRAVENOUS | Status: AC
  Administered 2020-02-19: 1000 mL via INTRAVENOUS

## 2020-02-19 NOTE — ED Triage Notes (Signed)
Patient reports having intermittent diarrhea x 4 days. Patient states she went to her PCP yesterday. PCP called the patient and reported that she was dehydrated. Abnormal lab- BUN-40 and Creatinine-1.07  Patient is on home O2 4L/min [place] during the day and O2 2L/min via  at night.

## 2020-02-19 NOTE — ED Provider Notes (Signed)
[facility] DEPT Provider Note   CSN: [id number] Arrival date & time: 02/19/20  1439     History Chief Complaint  Patient presents with  . Abnormal Lab  . Diarrhea    Meghan Wise is a 84 y.o. female.  HPI She presents for evaluation of elevated BUN/creatinine, with low GFR found, her GI doctor ordered some labs yesterday.  She has been seeing him for diarrhea.  Her diarrhea is recurrent, and present for several days, numerous times, but improved yesterday.  She describes the stool as stated black in color.  She has not seen any red blood in it.  She denies fever, cough, weakness or dizziness.  There are no other known modifying factors.    Past Medical History:  Diagnosis Date  . Heart failure ([place])   . HTN (hypertension)   . Hyperlipidemia   . Hypothyroidism   . Kidney disease    Stage IV- Dr. Servando Salina -LOV 02-11-14  . Osteoarthritis   . Osteoporosis   . Pulmonary hypertension ([place])   . Raynaud disease    reactive with cold and stress mostly fingers.  . Scleroderma ([place])    lungs and kidneys  . Shortness of breath dyspnea    with exertion-in Cardiac rehab program at [facility].    Patient Active Problem List   Diagnosis Date Noted  . ILD (interstitial lung disease) ([place]) 12/27/2018  . Pulmonary hypertension ([place]) 12/27/2018  . Dyspnea 04/04/2018  . SOB (shortness of breath) 04/02/2018  . Scleroderma ([place]) 04/02/2018  . PNA (pneumonia) 07/22/2016  . Acute on chronic respiratory failure with hypoxia ([place]) 07/21/2016  . Community acquired pneumonia 07/21/2016  . CKD (chronic kidney disease) stage 4, GFR 15-29 ml/min (HCC) 07/21/2016  . Anemia of chronic disease 07/21/2016  . AVM (arteriovenous malformation) of duodenum, acquired 05/07/2014  . Chronic diastolic CHF (congestive heart failure), NYHA class 4 ([place]) 05/28/2012  . HTN (hypertension)   . Raynaud disease   . Hypothyroidism     Past Surgical History:  Procedure  Laterality Date  . BREAST BIOPSY Bilateral    x   . CARDIAC CATHETERIZATION     2'15 [facility]  . CHOLECYSTECTOMY    . CHOLECYSTECTOMY, LAPAROSCOPIC    . COLONOSCOPY WITH PROPOFOL N/A 05/07/2014   Procedure: COLONOSCOPY WITH PROPOFOL;  Surgeon: Inda Castle, MD;  Location: [facility] ENDOSCOPY;  Service: Endoscopy;  Laterality: N/A;  . ESOPHAGOGASTRODUODENOSCOPY (EGD) WITH PROPOFOL N/A 05/07/2014   Procedure: ESOPHAGOGASTRODUODENOSCOPY (EGD) WITH PROPOFOL;  Surgeon: Inda Castle, MD;  [facility];  Service: Endoscopy;  Laterality: N/A;  . HOT HEMOSTASIS N/A 05/07/2014   Procedure: HOT HEMOSTASIS (ARGON PLASMA COAGULATION/BICAP);  Surgeon: Inda Castle, MD;  Location: [facility];  Service: Endoscopy;  Laterality: N/A;  deuodenal bulb  . SHOULDER ARTHROSCOPY W/ ROTATOR CUFF REPAIR Right      OB History   No obstetric history on file.     Family History  Problem Relation Age of Onset  . Heart disease Mother   . Heart disease Father   . Cancer Other        husband--esophageal  . Diabetes Son   . Stroke Daughter   . Breast cancer Neg Hx     Social History   Tobacco Use  . Smoking status: Former Smoker    Packs/day: 1.50    Years: 60.00    Pack years: 90.00    Types: Cigarettes    Quit date: 04/25/1987    Years since quitting:  32.8  . Smokeless tobacco: Never Used  Vaping Use  . Vaping Use: Never used  Substance Use Topics  . Alcohol use: Yes    Alcohol/week: 0.0 standard drinks    Comment: OCCASIONALLY- rare  . Drug use: No    Home Medications Prior to Admission medications   Medication Sig Start Date End Date Taking? Authorizing Provider  allopurinol (ZYLOPRIM) 100 MG tablet Take 100 mg by mouth daily. 11/13/16  Yes [provider]  ambrisentan (LETAIRIS) 10 MG tablet Take 10 mg by mouth daily.    Yes [provider]  atorvastatin (LIPITOR) 20 MG tablet Take 20 mg by mouth daily.   Yes [provider]  chlorthalidone (HYGROTON) 25 MG  tablet Take 25 mg by mouth daily.   Yes [provider]  Cholecalciferol (VITAMIN D3) 2000 units capsule Take 2,000 Units by mouth daily.   Yes [provider]  Cyanocobalamin (VITAMIN B-12 ER PO) Take 1 tablet by mouth daily.   Yes [provider]  denosumab (PROLIA) 60 MG/ML SOSY injection Inject 60 mg into the skin every 6 (six) months.   Yes [provider]  ferrous sulfate 324 MG TBEC Take 324 mg by mouth daily.   Yes [provider]  folic acid (FOLVITE) 761 MCG tablet Take 400 mcg by mouth every evening.    Yes [provider]  levothyroxine (SYNTHROID, LEVOTHROID) 50 MCG tablet Take 50 mcg by mouth every morning.   Yes [provider]  loperamide (IMODIUM A-D) 2 MG tablet Take 4 mg by mouth 4 (four) times daily as needed for diarrhea or loose stools.    Yes [provider]  Melatonin 10 MG TABS Take 10 mg by mouth daily as needed (sleep).    Yes [provider]  omeprazole (PRILOSEC) 20 MG capsule Take 20 mg by mouth daily.   Yes [provider]  OXYGEN Inhale 2-4 L into the lungs at bedtime. 4 L with exertion 2 L at night   Yes [provider]  tadalafil (ADCIRCA/CIALIS) 20 MG tablet Take 40 mg by mouth every evening.    Yes [provider]  torsemide (DEMADEX) 10 MG tablet Take 10 mg by mouth See admin instructions. Three times a week   Yes [provider]  atovaquone (MEPRON) 750 MG/5ML suspension Take 10 mLs (1,500 mg total) by mouth daily with breakfast. Patient not taking: Reported on 12/27/2018 05/10/18   Juanito Doom, MD  mycophenolate (CELLCEPT) 250 MG capsule Take 3 capsules (750 mg total) by mouth 2 (two) times daily. Patient not taking: Reported on 12/27/2018 05/10/18   Juanito Doom, MD  predniSONE (DELTASONE) 2.5 MG tablet Take 1 tablet (2.5 mg total) by mouth daily. Patient not taking: Reported on 02/19/2020 12/27/18   Parrett, Fonnie Mu, NP    Allergies     Losartan, Ace inhibitors, Sulfa antibiotics, Codeine, and Heparin  Review of Systems   Review of Systems  All other systems reviewed and are negative.   Physical Exam Updated Vital Signs BP (!) 132/50   Pulse 80   Temp 98.1 F (36.7 C) (Oral)   Resp 18   Ht _0  (1.473 m)   Wt 45.4 kg   SpO2 97%   BMI 20.90 kg/m   Physical Exam Vitals and nursing note reviewed.  Constitutional:      General: She is not in acute distress.    Appearance: She is well-developed. She is not ill-appearing, toxic-appearing or diaphoretic.  Comments: Elderly, frail  HENT:     Head: Normocephalic and atraumatic.     Right Ear: External ear normal.     Left Ear: External ear normal.     Mouth/Throat:     Mouth: Mucous membranes are moist.  Eyes:     Conjunctiva/sclera: Conjunctivae normal.     Pupils: Pupils are equal, round, and reactive to light.  Neck:     Trachea: Phonation normal.  Cardiovascular:     Rate and Rhythm: Normal rate and regular rhythm.     Heart sounds: Normal heart sounds.  Pulmonary:     Effort: Pulmonary effort is normal.     Breath sounds: Normal breath sounds.  Abdominal:     General: There is no distension.     Palpations: Abdomen is soft.     Tenderness: There is abdominal tenderness (suprapubic, mild).  Musculoskeletal:        General: Normal range of motion.     Cervical back: Normal range of motion and neck supple.  Skin:    General: Skin is warm and dry.  Neurological:     Mental Status: She is alert and oriented to person, place, and time.     Cranial Nerves: No cranial nerve deficit.     Sensory: No sensory deficit.     Motor: No abnormal muscle tone.     Coordination: Coordination normal.  Psychiatric:        Mood and Affect: Mood normal.        Behavior: Behavior normal.        Thought Content: Thought content normal.        Judgment: Judgment normal.     ED Results / Procedures / Treatments   Labs (all labs ordered are listed, but  only abnormal results are displayed) Labs Reviewed  COMPREHENSIVE METABOLIC PANEL - Abnormal; Notable for the following components:      Result Value   BUN 48 (*)    Creatinine, Ser 2.14 (*)    Calcium 7.9 (*)    Albumin 3.4 (*)    GFR, Estimated 23 (*)    All other components within normal limits  CBC - Abnormal; Notable for the following components:   RBC 3.56 (*)    Hemoglobin 11.6 (*)    HCT 35.2 (*)    All other components within normal limits  URINALYSIS, ROUTINE W REFLEX MICROSCOPIC - Abnormal; Notable for the following components:   Color, Urine STRAW (*)    All other components within normal limits  LIPASE, BLOOD  POC OCCULT BLOOD, ED    EKG None  Radiology No results found.  Procedures Procedures (including critical care time)  Medications Ordered in ED Medications  sodium chloride 0.9 % bolus 1,000 mL (0 mLs Intravenous Stopped 02/19/20 1827)    ED Course  I have reviewed the triage vital signs and the nursing notes.  Pertinent labs & imaging results that were available during my care of the patient were reviewed by me and considered in my medical decision making (see chart for details).  Clinical Course as of Feb 19 1928  Thu Feb 19, 2020  1906 POC occult blood, ED Provider will collect [EW]    Clinical Course User Index [EW] Daleen Bo, MD   MDM Rules/Calculators/A&P                           Patient Vitals for the past 24 hrs:  BP Temp Temp  src Pulse Resp SpO2 Height Weight  02/19/20 1845 (!) 132/50 -- -- 80 18 97 % -- --  02/19/20 1700 (!) 139/54 -- -- 78 20 98 % -- --  02/19/20 1449 -- -- -- -- -- -- _0  (1.473 m) 45.4 kg  02/19/20 1446 (!) 148/70 98.1 F (36.7 C) Oral 76 16 94 % -- --    7:15 PM Reevaluation with update and discussion. After initial assessment and treatment, an updated evaluation reveals she is comfortable, wants to go home, and understands implications of her findings and treatment plan.  All questions answered.  Daleen Bo   Medical Decision Making:  This patient is presenting for evaluation of elevated BUN and low GFR, which does require a range of treatment options, and is a complaint that involves a moderate risk of morbidity and mortality. The differential diagnoses include AKI, blood loss in GI tract. I decided to review old records, and in summary elderly female with diarrhea that is improving and decreasing renal function.  I not additional historical information from anyone.  Clinical Laboratory Tests Ordered, included CMET, CBC, . Review indicates BUN/Creat increased, GFR low.     Critical Interventions-clinical evaluation, laboratory testing, observation and reassessment  After These Interventions, the Patient was reevaluated and was found stable for discharge.  CRITICAL CARE-no Performed by: Daleen Bo  Nursing Notes Reviewed/ Care Coordinated Applicable Imaging Reviewed Interpretation of Laboratory Data incorporated into ED treatment  The patient appears reasonably screened and/or stabilized for discharge and I doubt any other medical condition or other [facility] requiring further screening, evaluation, or treatment in the ED at this time prior to discharge.  Plan: Home Medications-continue usual; Home Treatments-increase water intake by 1 L; return here if the recommended treatment, does not improve the symptoms; Recommended follow up-PCP follow-up 1 week for repeat labs and checkup     Final Clinical Impression(s) / ED Diagnoses Final diagnoses:  Dehydration  AKI (acute kidney injury) [facility])    Rx / [place] Orders ED Discharge Orders    None       Daleen Bo, MD 02/19/20 1947

## 2020-02-19 NOTE — Discharge Instructions (Addendum)
Your GFR has decreased from 27 to 23. To improve this drink 1 liter of water every day, in addition to the current measures that you are doing. The stool test for blood was negative today.  See your primary doctor for checkup in 1 week, to include repeat testing for kidney function.

## 2020-02-23 DIAGNOSIS — E860 Dehydration: Secondary | ICD-10-CM | POA: Diagnosis not present

## 2020-02-26 DIAGNOSIS — R197 Diarrhea, unspecified: Secondary | ICD-10-CM | POA: Diagnosis not present

## 2020-02-26 DIAGNOSIS — I2721 Secondary pulmonary arterial hypertension: Secondary | ICD-10-CM | POA: Diagnosis not present

## 2020-03-01 DIAGNOSIS — Z20828 Contact with and (suspected) exposure to other viral communicable diseases: Secondary | ICD-10-CM | POA: Diagnosis not present

## 2020-03-01 DIAGNOSIS — Z03818 Encounter for observation for suspected exposure to other biological agents ruled out: Secondary | ICD-10-CM | POA: Diagnosis not present

## 2020-03-08 DIAGNOSIS — R531 Weakness: Secondary | ICD-10-CM | POA: Diagnosis not present

## 2020-03-08 DIAGNOSIS — M340 Progressive systemic sclerosis: Secondary | ICD-10-CM | POA: Diagnosis not present

## 2020-03-08 DIAGNOSIS — R197 Diarrhea, unspecified: Secondary | ICD-10-CM | POA: Diagnosis not present

## 2020-03-08 DIAGNOSIS — E860 Dehydration: Secondary | ICD-10-CM | POA: Diagnosis not present

## 2020-03-29 DIAGNOSIS — E860 Dehydration: Secondary | ICD-10-CM | POA: Diagnosis not present

## 2020-03-29 DIAGNOSIS — R197 Diarrhea, unspecified: Secondary | ICD-10-CM | POA: Diagnosis not present

## 2020-03-29 DIAGNOSIS — M340 Progressive systemic sclerosis: Secondary | ICD-10-CM | POA: Diagnosis not present

## 2020-03-31 ENCOUNTER — Encounter (INDEPENDENT_AMBULATORY_CARE_PROVIDER_SITE_OTHER): Payer: Medicare PPO | Admitting: Ophthalmology

## 2020-04-02 ENCOUNTER — Other Ambulatory Visit: Payer: Self-pay

## 2020-04-02 ENCOUNTER — Encounter (INDEPENDENT_AMBULATORY_CARE_PROVIDER_SITE_OTHER): Payer: Medicare PPO | Admitting: Ophthalmology

## 2020-04-02 DIAGNOSIS — H35033 Hypertensive retinopathy, bilateral: Secondary | ICD-10-CM | POA: Diagnosis not present

## 2020-04-02 DIAGNOSIS — H59031 Cystoid macular edema following cataract surgery, right eye: Secondary | ICD-10-CM | POA: Diagnosis not present

## 2020-04-02 DIAGNOSIS — H43813 Vitreous degeneration, bilateral: Secondary | ICD-10-CM

## 2020-04-02 DIAGNOSIS — I10 Essential (primary) hypertension: Secondary | ICD-10-CM | POA: Diagnosis not present

## 2020-04-02 DIAGNOSIS — H35371 Puckering of macula, right eye: Secondary | ICD-10-CM

## 2020-04-05 ENCOUNTER — Encounter (INDEPENDENT_AMBULATORY_CARE_PROVIDER_SITE_OTHER): Payer: Medicare PPO | Admitting: Ophthalmology

## 2020-05-03 DIAGNOSIS — R634 Abnormal weight loss: Secondary | ICD-10-CM | POA: Diagnosis not present

## 2020-05-03 DIAGNOSIS — M340 Progressive systemic sclerosis: Secondary | ICD-10-CM | POA: Diagnosis not present

## 2020-05-03 DIAGNOSIS — R197 Diarrhea, unspecified: Secondary | ICD-10-CM | POA: Diagnosis not present

## 2020-05-04 DIAGNOSIS — K5900 Constipation, unspecified: Secondary | ICD-10-CM | POA: Diagnosis not present

## 2020-05-04 DIAGNOSIS — R194 Change in bowel habit: Secondary | ICD-10-CM | POA: Diagnosis not present

## 2020-05-04 DIAGNOSIS — R159 Full incontinence of feces: Secondary | ICD-10-CM | POA: Diagnosis not present

## 2020-05-19 DIAGNOSIS — I129 Hypertensive chronic kidney disease with stage 1 through stage 4 chronic kidney disease, or unspecified chronic kidney disease: Secondary | ICD-10-CM | POA: Diagnosis not present

## 2020-05-19 DIAGNOSIS — I272 Pulmonary hypertension, unspecified: Secondary | ICD-10-CM | POA: Diagnosis not present

## 2020-05-19 DIAGNOSIS — N1832 Chronic kidney disease, stage 3b: Secondary | ICD-10-CM | POA: Diagnosis not present

## 2020-05-19 DIAGNOSIS — D649 Anemia, unspecified: Secondary | ICD-10-CM | POA: Diagnosis not present

## 2020-05-26 DIAGNOSIS — H35371 Puckering of macula, right eye: Secondary | ICD-10-CM | POA: Diagnosis not present

## 2020-05-26 DIAGNOSIS — H35351 Cystoid macular degeneration, right eye: Secondary | ICD-10-CM | POA: Diagnosis not present

## 2020-05-27 DIAGNOSIS — I2721 Secondary pulmonary arterial hypertension: Secondary | ICD-10-CM | POA: Diagnosis not present

## 2020-06-08 DIAGNOSIS — I272 Pulmonary hypertension, unspecified: Secondary | ICD-10-CM | POA: Diagnosis not present

## 2020-06-08 DIAGNOSIS — D649 Anemia, unspecified: Secondary | ICD-10-CM | POA: Diagnosis not present

## 2020-06-08 DIAGNOSIS — I129 Hypertensive chronic kidney disease with stage 1 through stage 4 chronic kidney disease, or unspecified chronic kidney disease: Secondary | ICD-10-CM | POA: Diagnosis not present

## 2020-06-08 DIAGNOSIS — N179 Acute kidney failure, unspecified: Secondary | ICD-10-CM | POA: Diagnosis not present

## 2020-06-08 DIAGNOSIS — N1832 Chronic kidney disease, stage 3b: Secondary | ICD-10-CM | POA: Diagnosis not present

## 2020-06-16 DIAGNOSIS — R197 Diarrhea, unspecified: Secondary | ICD-10-CM | POA: Diagnosis not present

## 2020-06-16 DIAGNOSIS — R634 Abnormal weight loss: Secondary | ICD-10-CM | POA: Diagnosis not present

## 2020-06-16 DIAGNOSIS — M340 Progressive systemic sclerosis: Secondary | ICD-10-CM | POA: Diagnosis not present

## 2020-06-24 DIAGNOSIS — I2721 Secondary pulmonary arterial hypertension: Secondary | ICD-10-CM | POA: Diagnosis not present

## 2020-06-30 DIAGNOSIS — H401131 Primary open-angle glaucoma, bilateral, mild stage: Secondary | ICD-10-CM | POA: Diagnosis not present

## 2020-06-30 DIAGNOSIS — H35351 Cystoid macular degeneration, right eye: Secondary | ICD-10-CM | POA: Diagnosis not present

## 2020-06-30 DIAGNOSIS — H353122 Nonexudative age-related macular degeneration, left eye, intermediate dry stage: Secondary | ICD-10-CM | POA: Diagnosis not present

## 2020-06-30 DIAGNOSIS — H35371 Puckering of macula, right eye: Secondary | ICD-10-CM | POA: Diagnosis not present

## 2020-07-05 DIAGNOSIS — N1832 Chronic kidney disease, stage 3b: Secondary | ICD-10-CM | POA: Diagnosis not present

## 2020-07-14 DIAGNOSIS — R634 Abnormal weight loss: Secondary | ICD-10-CM | POA: Diagnosis not present

## 2020-07-14 DIAGNOSIS — E039 Hypothyroidism, unspecified: Secondary | ICD-10-CM | POA: Diagnosis not present

## 2020-07-14 DIAGNOSIS — D649 Anemia, unspecified: Secondary | ICD-10-CM | POA: Diagnosis not present

## 2020-07-14 DIAGNOSIS — I129 Hypertensive chronic kidney disease with stage 1 through stage 4 chronic kidney disease, or unspecified chronic kidney disease: Secondary | ICD-10-CM | POA: Diagnosis not present

## 2020-07-14 DIAGNOSIS — M810 Age-related osteoporosis without current pathological fracture: Secondary | ICD-10-CM | POA: Diagnosis not present

## 2020-07-14 DIAGNOSIS — J9691 Respiratory failure, unspecified with hypoxia: Secondary | ICD-10-CM | POA: Diagnosis not present

## 2020-07-14 DIAGNOSIS — E7800 Pure hypercholesterolemia, unspecified: Secondary | ICD-10-CM | POA: Diagnosis not present

## 2020-07-14 DIAGNOSIS — M349 Systemic sclerosis, unspecified: Secondary | ICD-10-CM | POA: Diagnosis not present

## 2020-07-14 DIAGNOSIS — N184 Chronic kidney disease, stage 4 (severe): Secondary | ICD-10-CM | POA: Diagnosis not present

## 2020-07-19 DIAGNOSIS — M1990 Unspecified osteoarthritis, unspecified site: Secondary | ICD-10-CM | POA: Diagnosis not present

## 2020-07-19 DIAGNOSIS — M349 Systemic sclerosis, unspecified: Secondary | ICD-10-CM | POA: Diagnosis not present

## 2020-07-19 DIAGNOSIS — M341 CR(E)ST syndrome: Secondary | ICD-10-CM | POA: Diagnosis not present

## 2020-07-19 DIAGNOSIS — J849 Interstitial pulmonary disease, unspecified: Secondary | ICD-10-CM | POA: Diagnosis not present

## 2020-07-19 DIAGNOSIS — I7300 Raynaud's syndrome without gangrene: Secondary | ICD-10-CM | POA: Diagnosis not present

## 2020-07-19 DIAGNOSIS — R634 Abnormal weight loss: Secondary | ICD-10-CM | POA: Diagnosis not present

## 2020-07-19 DIAGNOSIS — M109 Gout, unspecified: Secondary | ICD-10-CM | POA: Diagnosis not present

## 2020-07-19 DIAGNOSIS — M3501 Sicca syndrome with keratoconjunctivitis: Secondary | ICD-10-CM | POA: Diagnosis not present

## 2020-07-19 DIAGNOSIS — M810 Age-related osteoporosis without current pathological fracture: Secondary | ICD-10-CM | POA: Diagnosis not present

## 2020-07-26 DIAGNOSIS — M349 Systemic sclerosis, unspecified: Secondary | ICD-10-CM | POA: Diagnosis not present

## 2020-07-26 DIAGNOSIS — I2729 Other secondary pulmonary hypertension: Secondary | ICD-10-CM | POA: Diagnosis not present

## 2020-07-26 DIAGNOSIS — R197 Diarrhea, unspecified: Secondary | ICD-10-CM | POA: Diagnosis not present

## 2020-07-26 DIAGNOSIS — R600 Localized edema: Secondary | ICD-10-CM | POA: Diagnosis not present

## 2020-07-26 DIAGNOSIS — Z87891 Personal history of nicotine dependence: Secondary | ICD-10-CM | POA: Diagnosis not present

## 2020-07-26 DIAGNOSIS — R0602 Shortness of breath: Secondary | ICD-10-CM | POA: Diagnosis not present

## 2020-07-26 DIAGNOSIS — R002 Palpitations: Secondary | ICD-10-CM | POA: Diagnosis not present

## 2020-07-26 DIAGNOSIS — I083 Combined rheumatic disorders of mitral, aortic and tricuspid valves: Secondary | ICD-10-CM | POA: Diagnosis not present

## 2020-07-26 DIAGNOSIS — M7989 Other specified soft tissue disorders: Secondary | ICD-10-CM | POA: Diagnosis not present

## 2020-08-10 DIAGNOSIS — N1832 Chronic kidney disease, stage 3b: Secondary | ICD-10-CM | POA: Diagnosis not present

## 2020-08-18 DIAGNOSIS — I129 Hypertensive chronic kidney disease with stage 1 through stage 4 chronic kidney disease, or unspecified chronic kidney disease: Secondary | ICD-10-CM | POA: Diagnosis not present

## 2020-08-18 DIAGNOSIS — D649 Anemia, unspecified: Secondary | ICD-10-CM | POA: Diagnosis not present

## 2020-08-18 DIAGNOSIS — I272 Pulmonary hypertension, unspecified: Secondary | ICD-10-CM | POA: Diagnosis not present

## 2020-08-18 DIAGNOSIS — N179 Acute kidney failure, unspecified: Secondary | ICD-10-CM | POA: Diagnosis not present

## 2020-08-18 DIAGNOSIS — N1832 Chronic kidney disease, stage 3b: Secondary | ICD-10-CM | POA: Diagnosis not present

## 2020-09-07 DIAGNOSIS — N1832 Chronic kidney disease, stage 3b: Secondary | ICD-10-CM | POA: Diagnosis not present

## 2020-09-07 DIAGNOSIS — N189 Chronic kidney disease, unspecified: Secondary | ICD-10-CM | POA: Diagnosis not present

## 2020-09-15 DIAGNOSIS — R6881 Early satiety: Secondary | ICD-10-CM | POA: Diagnosis not present

## 2020-09-15 DIAGNOSIS — M340 Progressive systemic sclerosis: Secondary | ICD-10-CM | POA: Diagnosis not present

## 2020-09-15 DIAGNOSIS — R197 Diarrhea, unspecified: Secondary | ICD-10-CM | POA: Diagnosis not present

## 2020-09-15 DIAGNOSIS — R634 Abnormal weight loss: Secondary | ICD-10-CM | POA: Diagnosis not present

## 2020-10-12 DIAGNOSIS — N1832 Chronic kidney disease, stage 3b: Secondary | ICD-10-CM | POA: Diagnosis not present

## 2020-10-12 DIAGNOSIS — N189 Chronic kidney disease, unspecified: Secondary | ICD-10-CM | POA: Diagnosis not present

## 2020-11-10 DIAGNOSIS — D649 Anemia, unspecified: Secondary | ICD-10-CM | POA: Diagnosis not present

## 2020-11-10 DIAGNOSIS — N390 Urinary tract infection, site not specified: Secondary | ICD-10-CM | POA: Diagnosis not present

## 2020-11-10 DIAGNOSIS — N1832 Chronic kidney disease, stage 3b: Secondary | ICD-10-CM | POA: Diagnosis not present

## 2020-11-25 DIAGNOSIS — D649 Anemia, unspecified: Secondary | ICD-10-CM | POA: Diagnosis not present

## 2020-11-25 DIAGNOSIS — N1832 Chronic kidney disease, stage 3b: Secondary | ICD-10-CM | POA: Diagnosis not present

## 2020-11-25 DIAGNOSIS — I272 Pulmonary hypertension, unspecified: Secondary | ICD-10-CM | POA: Diagnosis not present

## 2020-11-25 DIAGNOSIS — I129 Hypertensive chronic kidney disease with stage 1 through stage 4 chronic kidney disease, or unspecified chronic kidney disease: Secondary | ICD-10-CM | POA: Diagnosis not present

## 2020-11-25 DIAGNOSIS — N179 Acute kidney failure, unspecified: Secondary | ICD-10-CM | POA: Diagnosis not present

## 2020-12-13 DIAGNOSIS — R5382 Chronic fatigue, unspecified: Secondary | ICD-10-CM | POA: Diagnosis not present

## 2020-12-13 DIAGNOSIS — R350 Frequency of micturition: Secondary | ICD-10-CM | POA: Diagnosis not present

## 2020-12-23 ENCOUNTER — Ambulatory Visit
Admission: RE | Admit: 2020-12-23 | Discharge: 2020-12-23 | Disposition: A | Discharge status: Home / Self Care | Payer: Medicare PPO | Source: Ambulatory Visit | Attending: Internal Medicine | Admitting: Internal Medicine

## 2020-12-23 ENCOUNTER — Other Ambulatory Visit: Payer: Self-pay | Admitting: Internal Medicine

## 2020-12-23 DIAGNOSIS — R0602 Shortness of breath: Secondary | ICD-10-CM

## 2020-12-23 DIAGNOSIS — R531 Weakness: Secondary | ICD-10-CM | POA: Diagnosis not present

## 2020-12-23 DIAGNOSIS — J9621 Acute and chronic respiratory failure with hypoxia: Secondary | ICD-10-CM | POA: Diagnosis not present

## 2020-12-24 ENCOUNTER — Other Ambulatory Visit (HOSPITAL_COMMUNITY): Payer: Self-pay | Admitting: Family Medicine

## 2020-12-24 ENCOUNTER — Other Ambulatory Visit: Payer: Self-pay

## 2020-12-24 ENCOUNTER — Ambulatory Visit (HOSPITAL_BASED_OUTPATIENT_CLINIC_OR_DEPARTMENT_OTHER)
Admission: RE | Admit: 2020-12-24 | Discharge: 2020-12-24 | Disposition: A | Discharge status: Home / Self Care | Payer: Medicare PPO | Source: Ambulatory Visit | Attending: Family Medicine | Admitting: Family Medicine

## 2020-12-24 DIAGNOSIS — R9389 Abnormal findings on diagnostic imaging of other specified body structures: Secondary | ICD-10-CM

## 2020-12-24 DIAGNOSIS — R0602 Shortness of breath: Secondary | ICD-10-CM | POA: Diagnosis not present

## 2020-12-24 DIAGNOSIS — I700 Atherosclerosis of aorta: Secondary | ICD-10-CM | POA: Diagnosis not present

## 2020-12-24 DIAGNOSIS — J439 Emphysema, unspecified: Secondary | ICD-10-CM | POA: Diagnosis not present

## 2020-12-24 DIAGNOSIS — R918 Other nonspecific abnormal finding of lung field: Secondary | ICD-10-CM | POA: Diagnosis not present

## 2020-12-24 DIAGNOSIS — R911 Solitary pulmonary nodule: Secondary | ICD-10-CM | POA: Diagnosis not present

## 2021-01-17 ENCOUNTER — Ambulatory Visit (INDEPENDENT_AMBULATORY_CARE_PROVIDER_SITE_OTHER): Payer: Medicare PPO | Admitting: Otolaryngology

## 2021-01-17 ENCOUNTER — Other Ambulatory Visit: Payer: Self-pay

## 2021-01-17 DIAGNOSIS — H6501 Acute serous otitis media, right ear: Secondary | ICD-10-CM

## 2021-01-17 DIAGNOSIS — D649 Anemia, unspecified: Secondary | ICD-10-CM | POA: Diagnosis not present

## 2021-01-17 DIAGNOSIS — H903 Sensorineural hearing loss, bilateral: Secondary | ICD-10-CM | POA: Diagnosis not present

## 2021-01-17 DIAGNOSIS — N1832 Chronic kidney disease, stage 3b: Secondary | ICD-10-CM | POA: Diagnosis not present

## 2021-01-17 DIAGNOSIS — H6123 Impacted cerumen, bilateral: Secondary | ICD-10-CM | POA: Diagnosis not present

## 2021-01-17 MED ORDER — FLUTICASONE PROPIONATE 50 MCG/ACT NA SUSP: 2.0000 | Freq: Every day | NASAL | 6 refills | Status: DC

## 2021-01-17 NOTE — Progress Notes (Signed)
HPI: Meghan Wise is a 85 y.o. female who presents is referred by Colletta Maryland for evaluation of right serous otitis media.  .  She has longstanding hearing loss and has a bilateral hearing aids.  She recently presented to [facility] because of decreased hearing in the right ear and Colletta Maryland referred her to myself because of serous effusion noted on recent exam prior to getting a repeat hearing test. Review of her hearing test from January 2020 revealed moderate bilateral sensorineural hearing loss.  Past Medical History:  Diagnosis Date   Heart failure ([place])    HTN (hypertension)    Hyperlipidemia    Hypothyroidism    Kidney disease    Stage IV- Dr. Servando Salina -LOV 02-11-14   Osteoarthritis    Osteoporosis    Pulmonary hypertension (HCC)    Raynaud disease    reactive with cold and stress mostly fingers.   Scleroderma ([place])    lungs and kidneys   Shortness of breath dyspnea    with exertion-in Cardiac rehab program at [facility].   Past Surgical History:  Procedure Laterality Date   BREAST BIOPSY Bilateral    x    CARDIAC CATHETERIZATION     2'15 [facility]   CHOLECYSTECTOMY     CHOLECYSTECTOMY, LAPAROSCOPIC     COLONOSCOPY WITH PROPOFOL N/A 05/07/2014   Procedure: COLONOSCOPY WITH PROPOFOL;  Surgeon: Inda Castle, MD;  [facility];  Service: Endoscopy;  Laterality: N/A;   ESOPHAGOGASTRODUODENOSCOPY (EGD) WITH PROPOFOL N/A 05/07/2014   Procedure: ESOPHAGOGASTRODUODENOSCOPY (EGD) WITH PROPOFOL;  Surgeon: Inda Castle, MD;  [facility];  Service: Endoscopy;  Laterality: N/A;   HOT HEMOSTASIS N/A 05/07/2014   Procedure: HOT HEMOSTASIS (ARGON PLASMA COAGULATION/BICAP);  Surgeon: Inda Castle, MD;  Location: [facility];  Service: Endoscopy;  Laterality: N/A;  deuodenal bulb   SHOULDER ARTHROSCOPY W/ ROTATOR CUFF REPAIR Right    Social History   Socioeconomic History   Marital status: Widowed    Spouse name: Not on file   Number of children: 5   Years of  education: Not on file   Highest education level: Not on file  Occupational History   Occupation: Retired    Comment: Library Asst.  Tobacco Use   Smoking status: Former    Packs/day: 1.50    Years: 60.00    Pack years: 90.00    Types: Cigarettes    Quit date: 04/25/1987    Years since quitting: 33.7   Smokeless tobacco: Never  Vaping Use   Vaping Use: Never used  Substance and Sexual Activity   Alcohol use: Yes    Alcohol/week: 0.0 standard drinks    Comment: OCCASIONALLY- rare   Drug use: No   Sexual activity: Not on file  Other Topics Concern   Not on file  Social History Narrative   Not on file   Social Determinants of Health   Financial Resource Strain: Not on file  Food Insecurity: Not on file  Transportation Needs: Not on file  Physical Activity: Not on file  Stress: Not on file  Social Connections: Not on file   Family History  Problem Relation Age of Onset   Heart disease Mother    Heart disease Father    Cancer Other        husband--esophageal   Diabetes Son    Stroke Daughter    Breast cancer Neg Hx    Allergies  Allergen Reactions   Losartan Other (See Comments) and Cough    High calcium  count and renal problems   Ace Inhibitors Other (See Comments) and Cough    Dizziness and coughing    Sulfa Antibiotics Hives   Codeine Nausea Only   Heparin Anxiety and Other (See Comments)    Pt reports that they have a sensitivity towards heparin. Pt becomes depressed and anxious to the point of crying uncontrollably.    Prior to Admission medications   Medication Sig Start Date End Date Taking? Authorizing Provider  allopurinol (ZYLOPRIM) 100 MG tablet Take 100 mg by mouth daily. 11/13/16   [provider]  ambrisentan (LETAIRIS) 10 MG tablet Take 10 mg by mouth daily.     [provider]  atorvastatin (LIPITOR) 20 MG tablet Take 20 mg by mouth daily.    [provider]  atovaquone (MEPRON) 750 MG/5ML suspension Take 10 mLs (1,500  mg total) by mouth daily with breakfast. Patient not taking: Reported on 12/27/2018 05/10/18   Juanito Doom, MD  chlorthalidone (HYGROTON) 25 MG tablet Take 25 mg by mouth daily.    [provider]  Cholecalciferol (VITAMIN D3) 2000 units capsule Take 2,000 Units by mouth daily.    [provider]  Cyanocobalamin (VITAMIN B-12 ER PO) Take 1 tablet by mouth daily.    [provider]  denosumab (PROLIA) 60 MG/ML SOSY injection Inject 60 mg into the skin every 6 (six) months.    [provider]  ferrous sulfate 324 MG TBEC Take 324 mg by mouth daily.    [provider]  folic acid (FOLVITE) 824 MCG tablet Take 400 mcg by mouth every evening.     [provider]  levothyroxine (SYNTHROID, LEVOTHROID) 50 MCG tablet Take 50 mcg by mouth every morning.    [provider]  loperamide (IMODIUM A-D) 2 MG tablet Take 4 mg by mouth 4 (four) times daily as needed for diarrhea or loose stools.     [provider]  Melatonin 10 MG TABS Take 10 mg by mouth daily as needed (sleep).     [provider]  mycophenolate (CELLCEPT) 250 MG capsule Take 3 capsules (750 mg total) by mouth 2 (two) times daily. Patient not taking: Reported on 12/27/2018 05/10/18   Juanito Doom, MD  omeprazole (PRILOSEC) 20 MG capsule Take 20 mg by mouth daily.    [provider]  OXYGEN Inhale 2-4 L into the lungs at bedtime. 4 L with exertion 2 L at night    [provider]  predniSONE (DELTASONE) 2.5 MG tablet Take 1 tablet (2.5 mg total) by mouth daily. Patient not taking: Reported on 02/19/2020 12/27/18   Parrett, Fonnie Mu, NP  tadalafil (ADCIRCA/CIALIS) 20 MG tablet Take 40 mg by mouth every evening.     [provider]  torsemide (DEMADEX) 10 MG tablet Take 10 mg by mouth See admin instructions. Three times a week    [provider]     Positive ROS: Otherwise negative  All other systems have been reviewed and  were otherwise negative with the exception of those mentioned in the HPI and as above.  Physical Exam: Constitutional: Alert, well-appearing, no acute distress Ears: External ears without lesions or tenderness.  She had little bit of wax buildup in both ear canals that was cleaned with curettes.  Left TM was clear.  Right TM with minimal serous otitis with some bubbles.  On tuning fork testing AC was greater than BC on the right side but she had difficulty hearing. Nasal: External nose  without lesions. Septum with minimal deformity and mild rhinitis.  Middle meatus regions were clear with no signs of infection.. Clear nasal passages Oral: Lips and gums without lesions. Tongue and palate mucosa without lesions. Posterior oropharynx clear. Neck: No palpable adenopathy or masses Respiratory: Breathing comfortably  Skin: No facial/neck lesions or rash noted.  Cerumen impaction removal  Date/Time: 01/17/2021 5:39 PM Performed by: Rozetta Nunnery, MD Authorized by: Rozetta Nunnery, MD   Consent:    Consent obtained:  Verbal   Consent given by:  Patient   Risks discussed:  Pain and bleeding Procedure details:    Location:  L ear and R ear   Procedure type: curette   Post-procedure details:    Inspection:  TM intact and canal normal   Hearing quality:  Improved   Procedure completion:  Tolerated well, no immediate complications Comments:     Ear canals were cleaned with curettes bilaterally.  She had a minimal right serous otitis.  Left TM was clear.  Assessment: Right serous otitis media in patient with underlying moderate to severe sensorineural hearing loss in both ears  Plan: Recommended regular use of Flonase 2 sprays each nostril at night for the next few weeks or until the hearing improves. She will follow-up with Colletta Maryland concerning repeat hearing test in a couple weeks.   Meghan Journey, MD   CC:

## 2021-01-24 DIAGNOSIS — I129 Hypertensive chronic kidney disease with stage 1 through stage 4 chronic kidney disease, or unspecified chronic kidney disease: Secondary | ICD-10-CM | POA: Diagnosis not present

## 2021-01-24 DIAGNOSIS — R059 Cough, unspecified: Secondary | ICD-10-CM | POA: Diagnosis not present

## 2021-01-24 DIAGNOSIS — R600 Localized edema: Secondary | ICD-10-CM | POA: Diagnosis not present

## 2021-01-24 DIAGNOSIS — R000 Tachycardia, unspecified: Secondary | ICD-10-CM | POA: Diagnosis not present

## 2021-01-24 DIAGNOSIS — N184 Chronic kidney disease, stage 4 (severe): Secondary | ICD-10-CM | POA: Diagnosis not present

## 2021-01-24 DIAGNOSIS — I2729 Other secondary pulmonary hypertension: Secondary | ICD-10-CM | POA: Diagnosis not present

## 2021-01-24 DIAGNOSIS — R0602 Shortness of breath: Secondary | ICD-10-CM | POA: Diagnosis not present

## 2021-01-24 DIAGNOSIS — R7989 Other specified abnormal findings of blood chemistry: Secondary | ICD-10-CM | POA: Diagnosis not present

## 2021-01-24 DIAGNOSIS — M349 Systemic sclerosis, unspecified: Secondary | ICD-10-CM | POA: Diagnosis not present

## 2021-01-26 DIAGNOSIS — H40051 Ocular hypertension, right eye: Secondary | ICD-10-CM | POA: Diagnosis not present

## 2021-01-26 DIAGNOSIS — H401131 Primary open-angle glaucoma, bilateral, mild stage: Secondary | ICD-10-CM | POA: Diagnosis not present

## 2021-02-02 DIAGNOSIS — N1832 Chronic kidney disease, stage 3b: Secondary | ICD-10-CM | POA: Diagnosis not present

## 2021-02-03 ENCOUNTER — Other Ambulatory Visit: Payer: Self-pay | Admitting: Family Medicine

## 2021-02-03 DIAGNOSIS — Z1231 Encounter for screening mammogram for malignant neoplasm of breast: Secondary | ICD-10-CM

## 2021-02-23 DIAGNOSIS — M349 Systemic sclerosis, unspecified: Secondary | ICD-10-CM | POA: Diagnosis not present

## 2021-02-23 DIAGNOSIS — J849 Interstitial pulmonary disease, unspecified: Secondary | ICD-10-CM | POA: Diagnosis not present

## 2021-02-23 DIAGNOSIS — M3501 Sicca syndrome with keratoconjunctivitis: Secondary | ICD-10-CM | POA: Diagnosis not present

## 2021-02-23 DIAGNOSIS — R634 Abnormal weight loss: Secondary | ICD-10-CM | POA: Diagnosis not present

## 2021-02-23 DIAGNOSIS — I7300 Raynaud's syndrome without gangrene: Secondary | ICD-10-CM | POA: Diagnosis not present

## 2021-02-23 DIAGNOSIS — I272 Pulmonary hypertension, unspecified: Secondary | ICD-10-CM | POA: Diagnosis not present

## 2021-02-23 DIAGNOSIS — K2100 Gastro-esophageal reflux disease with esophagitis, without bleeding: Secondary | ICD-10-CM | POA: Diagnosis not present

## 2021-02-23 DIAGNOSIS — M341 CR(E)ST syndrome: Secondary | ICD-10-CM | POA: Diagnosis not present

## 2021-02-23 DIAGNOSIS — M1990 Unspecified osteoarthritis, unspecified site: Secondary | ICD-10-CM | POA: Diagnosis not present

## 2021-03-01 DIAGNOSIS — J9621 Acute and chronic respiratory failure with hypoxia: Secondary | ICD-10-CM | POA: Diagnosis not present

## 2021-03-01 DIAGNOSIS — E7800 Pure hypercholesterolemia, unspecified: Secondary | ICD-10-CM | POA: Diagnosis not present

## 2021-03-01 DIAGNOSIS — Z0000 Encounter for general adult medical examination without abnormal findings: Secondary | ICD-10-CM | POA: Diagnosis not present

## 2021-03-01 DIAGNOSIS — Z1389 Encounter for screening for other disorder: Secondary | ICD-10-CM | POA: Diagnosis not present

## 2021-03-01 DIAGNOSIS — I129 Hypertensive chronic kidney disease with stage 1 through stage 4 chronic kidney disease, or unspecified chronic kidney disease: Secondary | ICD-10-CM | POA: Diagnosis not present

## 2021-03-01 DIAGNOSIS — I700 Atherosclerosis of aorta: Secondary | ICD-10-CM | POA: Diagnosis not present

## 2021-03-01 DIAGNOSIS — N184 Chronic kidney disease, stage 4 (severe): Secondary | ICD-10-CM | POA: Diagnosis not present

## 2021-03-01 DIAGNOSIS — E039 Hypothyroidism, unspecified: Secondary | ICD-10-CM | POA: Diagnosis not present

## 2021-03-01 DIAGNOSIS — M810 Age-related osteoporosis without current pathological fracture: Secondary | ICD-10-CM | POA: Diagnosis not present

## 2021-03-07 ENCOUNTER — Ambulatory Visit: Payer: Medicare PPO

## 2021-03-10 ENCOUNTER — Other Ambulatory Visit: Payer: Self-pay

## 2021-03-10 ENCOUNTER — Telehealth: Payer: Self-pay | Admitting: Nurse Practitioner

## 2021-03-10 ENCOUNTER — Ambulatory Visit (INDEPENDENT_AMBULATORY_CARE_PROVIDER_SITE_OTHER): Payer: Medicare PPO

## 2021-03-10 ENCOUNTER — Ambulatory Visit: Payer: Medicare PPO | Admitting: Primary Care

## 2021-03-10 ENCOUNTER — Ambulatory Visit: Payer: Medicare PPO

## 2021-03-10 ENCOUNTER — Encounter: Payer: Self-pay | Admitting: Primary Care

## 2021-03-10 VITALS — BP 120/68 | HR 64 | Temp 98.1°F | Ht <= 58 in | Wt 91.8 lb

## 2021-03-10 DIAGNOSIS — R0781 Pleurodynia: Secondary | ICD-10-CM

## 2021-03-10 LAB — BASIC METABOLIC PANEL
BUN: 57 mg/dL — ABNORMAL HIGH (ref 6–23)
CO2: 28 mEq/L (ref 19–32)
Calcium: 8.8 mg/dL (ref 8.4–10.5)
Chloride: 103 mEq/L (ref 96–112)
Creatinine, Ser: 1.6 mg/dL — ABNORMAL HIGH (ref 0.40–1.20)
GFR: 29.62 mL/min — ABNORMAL LOW (ref >60.00)
Glucose, Bld: 151 mg/dL — ABNORMAL HIGH (ref 70–99)
Potassium: 3.5 mEq/L (ref 3.5–5.1)
Sodium: 144 mEq/L (ref 135–145)

## 2021-03-10 LAB — D-DIMER, QUANTITATIVE: D-Dimer, Quant: 1.62 mcg/mL FEU — ABNORMAL HIGH (ref <0.50)

## 2021-03-10 NOTE — Telephone Encounter (Signed)
I called the patient and give her the results and she voices understanding. She did not have any questions. I have placed the new order for this and patient aware she will get a call. Nothing further needed.

## 2021-03-10 NOTE — Patient Instructions (Addendum)
If D-dimer is elevated we will obtain a CTA of your chest to rule out PE  Orders: Labs today (d-dimer and bmet) CXR today   Follow-up: FIRST available with Dr. Vaughan Browner- new patient ILD (11/29, 11/30 or 12/1)

## 2021-03-10 NOTE — Progress Notes (Signed)
Spoke with pt and notified of results per Beth.  Pt verbalized understanding and denied any questions. 

## 2021-03-10 NOTE — Telephone Encounter (Signed)
-----  Message from Marland Kitchen V, NP sent at 03/10/2021  4:07 PM EST ----- Please order stat VQ scan for patient. Notify patient that D-dimer was elevated, which could be related to her CHF but need to rule out pulmonary embolism given her symptoms. She cannot have a CTA chest due to her increased creatinine. Thanks!

## 2021-03-10 NOTE — Progress Notes (Signed)
_0  ID: Meghan Wise, female    DOB: 1936-01-26, 85 y.o.   MRN: [id number]  Chief Complaint  Patient presents with   Follow-up    "Hard to catch breathe"    Referring provider: Carol Ada, MD  HPI: 85 year old female former smoker followed for scleroderma related ILD and Pulmonary hypertension followed at [facility] hypertension clinic  Previous [facility] pulmonary encounter:  12/27/2018 Follow up : Scleroderma related ILD , Pulmonary HTN  Patient presents for a follow-up.  Last seen January 2020, at that visit was started on CellCept for suspected worsening ILD and declining DLCO .  Follow-up CT chest July 03, 2018 showed no fibrotic interstitial lung disease.  Scattered postinfectious inflammatory scarring with benign appearing nodular densities. PFTs March 2020 showed normal lung function with no airflow obstruction or restriction but worsening diffusing capacity (FEV1 104%, ratio 73, FVC 103%, no significant bronchodilator response, DLCO 39%.)  Previous PFTs in October 2019 DLCO 42% She has been following with [facility] pulmonary HTN clinic she has been tapered to prednisone 2.5 mg daily due to fluid retention this week.  Recently torsemide was increased to 10 mg 3 times a week for worsening lower extremity edema and elevated BNP.  Lab work on 8/31 showed creatinine at 1.2 LFTs normal hemoglobin 11.8 WBC 10.5 BNP 1191, Spirometry showed 82%, ratio 63, FVC 95% DLCO 28% Since last visit patient says she is doing better . Feels her breathing is slightly improved and she is some better.  She was unable to tolerate CellCept due to severe GI issues despite lower dosing options.  Remains on oxygen 4 l/m with activity , none at rest and 2l/m At bedtime    01/24/21- Dr. Daine Wise with [facility]  Meghan Wise presents for ongoing management of pulmonary hypertension. She is accompanied by her daughter.  Since we last saw her, she had a UTI and was laid really low with that. Was  treated with one antibiotic - then switched to another that seemed to knock it out. She was SOB around the time of the UTI and had a CXR and then a CT scan. Now she is feeling pretty good. Not very active. The most activity she does is walking to her meals - has to walk down a long hallway and a shorter distance to the room. Will get SOB with doing that - will have to stop and catch her breath. She will get SOB getting dressed. No chest pain or tightness. Occasional pressure that is random - very rare. Will notice her heart racing when she lays down at night. No syncope or presyncope. Does have some ankle edema. A couple of times has woken up short of breath - sits up and it immediately goes away.  Her blood pressure has been running high. Has been told by her nephrologist to take torsemide if it is running high - has taken it the past two days.  She is now using 4L O2 with exertion.  # APAH-SSc. Symptoms are functional class 3. Overall she is declining but I think this is largely related to her scleroderma and deconditioning. We discussed the importance of exercise and nutrition. - Continue treatment with ambrisentan and tadalafil.  # Uncontrolled HTN. Also concern for potential scleroderma renal crisis but I think that is much less likely. She also has an allergy (cough) to ACEI and ARB. - Restart amlodipine 5 mg daily.  # CKD. Prerenal azotemia likely related to torsemide use the past couple of days. -  Instructed her to only take the torsemide if she notices swelling / fluid and no more than 3x / wk.  Patient Instructions   Start the amlodipine for your blood pressure.  Continue to check your blood pressure and call our office on Thursday or Friday to confirm that it is better controlled.  Only take the torsemide if you notice swelling or fluid gain - no more than 3 times a week.  Try to start walking more - slowly build up to 30 minutes/day, 5 days/week.  See you back in six  months.   03/10/2021- interim hx  Patient presents today for overdue follow-up. She was last seen in our office in September 2020. She has been following with Dr. Daine Wise with [facility]. Previously following with Dr. [place] Wise for ILD-scleroderma related. Unable to tolerate cellcept, following with rheumatology for scleroderma. She was recently started back on 35m prednisone daily. [facility] . Last seen on 01/24/21. Following with cardiology for CHF, maintained on diuretics. Maintained on 4L with activity and 2L at bedtime   She developed pain left side around her ribs Saturday night after exercising with 2lbs weights. Pain worsened and EMS was called later that night, her vitals were stable. She had trouble taking deep breath. Symptoms improved. She spoke with pulmonologist and hew as concerned about possible blood slot, he recommended ED but she called our office   She experiences shortness of breath with exertion only, worse today than normal. When she takes a deep breath she experiences slight chest tightness. No chest pain this morning. No active cough or wheezing.    TEST/EVENTS :  Chest imaging: CT chest 12/12/12: Bilateral irregular pulmonary nodules are all stable. Interstitial thickening most evident at the lung bases. Areas of coarse reticular scarring mostly in anterior upper l9obes and at apices are stable. Borderline mediastinal adenopathy. CT chest 02/23/15 ([facility]: 1. Overall the appearance the chest is very similar to the prior examination, with slight progression of disease, as detailed above. Given the mid to upper lung predominance of the nodules and apparent perilymphatic distribution, these findings, in association with some chronic mediastinal and hilar lymphadenopathy is most favored to represent sarcoidosis. Clinical correlation is recommended. No imaging findings to suggest interstitial lung disease at this Time. Air  trapping, indicative of small airways disease. Atherosclerosis, including three-vessel coronary artery disease. Assessment for potential risk factor modification, dietary therapy or pharmacologic therapy may be warranted, if clinically indicated.  [facility] predominant groundglass and septal thickening with scattered irregular pulmonary nodules stable to minimally progressed compared to the previous study have been present over several years with only mild progression, favored to be secondary to patient's underlying connective tissue disease stable hilar mediastinal adenopathy, minimal new left lower lobe groundglass opacities likely represent superimposed aspiration  HRCT chest 06/2018 biapical pleural-parenchymal scarring, emphysema, Negative for ILD.  Scattered postinfectious postinflammatory scarring in the lungs bilaterally residual benign-appearing nodular densities.    PFT: PFT 08/12/12: TLC 108% pred, FVC 105% pred, FEV1 114% pred, FEV1/FVC 73%, FEF25-75% 59% pred, DLCO 48% pred. October 2019 forced vital capacity 1.60 L 91% predicted, total lung capacity 6.88 L 175% predicted, residual volume to 43% predicted, DLCO 6.1 mL 42% predicted   Labs:   Path:   6 Min walk 02/2018> could not complete, walked 3 minutes, made it 68 m total, O2 saturation was 93% nadir on 4L [place]   Echo: TTE 09/26/12: LVEF  60-65%, grade 1 LVDD. Mild AR. Trivial MR. Mild biatrial enlargement. RV mildly enlarged. RVSP estimated at 44 mmHg. IVC normal in size with normal respiratory collapse. TTE 05/05/13: LVEF > 55%, grade 1 LVDD, septal flattening. Severely enlarged RV, moderate dysfunction, RA moderately enlarged, IVC normal in size with abnormal respiratory collapse. Moderate TR, TRjet 4.1 m/s, RVSP 75 mmHg. February 2019 echocardiogram [facility] normal LV systolic function, normal right ventricular systolic function, no valvular stenosis, RVSP 57 mmHg   Other  imaging: 01/2018 modified barium swallow> no difficulty with swallowing, suspect primary esophageal dysphagia   Heart Catheterization: January 11, 2018 right heart catheterization from [facility] right atrial 8, PA pressure 72/32 mean 42 wedge pressure 14, cardiac index 3.4 L/min/m, PVR 6 [place] units RHC 05/29/13: RA 13, RV 68/15, PA 72/28 (mean 45), PCWP 7, CO 3.5, CI 2.4, PVR 9.5 WU. No significant response to nitric oxide.   Allergies  Allergen Reactions   Losartan Other (See Comments) and Cough    High calcium count and renal problems   Ace Inhibitors Other (See Comments) and Cough    Dizziness and coughing    Sulfa Antibiotics Hives   Codeine Nausea Only   Heparin Anxiety and Other (See Comments)    Pt reports that they have a sensitivity towards heparin. Pt becomes depressed and anxious to the point of crying uncontrollably.     Immunization History  Administered Date(s) Administered   Influenza Split 01/09/2008, 01/25/2010, 01/30/2011, 01/23/2012, 02/05/2012, 01/22/2013, 01/25/2015, 01/23/2018, 02/05/2019, 02/18/2021   Influenza, High Dose Seasonal PF 01/27/2015, 02/12/2017, 01/23/2018, 02/04/2019   Influenza-Unspecified 01/23/2013, 02/05/2014, 01/25/2015   Moderna Sars-Covid-2 Vaccination 05/26/2019, 06/23/2019, 07/11/2019   PFIZER(Purple Top)SARS-COV-2 Vaccination 04/11/2020, 11/20/2020   Pneumococcal Conjugate-13 03/10/2013   Pneumococcal Polysaccharide-23 09/16/2009, 02/23/2013   Td 04/24/2005   Tdap 10/10/2011   Zoster, Live 01/12/2010, 01/16/2020, 04/22/2020    Past Medical History:  Diagnosis Date   Heart failure ([place])    HTN (hypertension)    Hyperlipidemia    Hypothyroidism    Kidney disease    Stage IV- Dr. Servando Salina -LOV 02-11-14   Osteoarthritis    Osteoporosis    Pulmonary hypertension (HCC)    Raynaud disease    reactive with cold and stress mostly fingers.   Scleroderma ([place])    lungs and kidneys   Shortness of breath dyspnea    with  exertion-in Cardiac rehab program at [facility].    Tobacco History: Social History   Tobacco Use  Smoking Status Former   Packs/day: 1.50   Years: 60.00   Pack years: 90.00   Types: Cigarettes   Quit date: 04/25/1987   Years since quitting: 33.8  Smokeless Tobacco Never   Counseling given: Not Answered   Outpatient Medications Prior to Visit  Medication Sig Dispense Refill   ambrisentan (LETAIRIS) 10 MG tablet Take 10 mg by mouth daily.      atorvastatin (LIPITOR) 20 MG tablet Take 20 mg by mouth daily.     ferrous sulfate 324 MG TBEC Take 324 mg by mouth daily.     folic acid (FOLVITE) 970 MCG tablet Take 400 mcg by mouth every evening.      levothyroxine (SYNTHROID, LEVOTHROID) 50 MCG tablet Take 50 mcg by mouth every morning.     loperamide (IMODIUM A-D) 2 MG tablet Take 4 mg by mouth 4 (four) times daily as needed for diarrhea or loose stools.      Melatonin 10 MG TABS Take 10 mg by mouth daily as  needed (sleep).      omeprazole (PRILOSEC) 20 MG capsule Take 20 mg by mouth daily.     OXYGEN Inhale 2-4 L into the lungs at bedtime. 4 L with exertion 2 L at night     tadalafil (ADCIRCA/CIALIS) 20 MG tablet Take 40 mg by mouth every evening.      torsemide (DEMADEX) 10 MG tablet Take 10 mg by mouth See admin instructions. Three times a week     allopurinol (ZYLOPRIM) 100 MG tablet Take 100 mg by mouth daily. (Patient not taking: Reported on 03/10/2021)  0   atovaquone (MEPRON) 750 MG/5ML suspension Take 10 mLs (1,500 mg total) by mouth daily with breakfast. (Patient not taking: Reported on 03/10/2021) 300 mL 5   chlorthalidone (HYGROTON) 25 MG tablet Take 25 mg by mouth daily. (Patient not taking: Reported on 03/10/2021)     Cholecalciferol (VITAMIN D3) 2000 units capsule Take 2,000 Units by mouth daily. (Patient not taking: Reported on 03/10/2021)     Cyanocobalamin (VITAMIN B-12 ER PO) Take 1 tablet by mouth daily. (Patient not taking: Reported on 03/10/2021)     denosumab (PROLIA)  60 MG/ML SOSY injection Inject 60 mg into the skin every 6 (six) months. (Patient not taking: Reported on 03/10/2021)     fluticasone (FLONASE) 50 MCG/ACT nasal spray Place 2 sprays into both nostrils daily. (Patient not taking: Reported on 03/10/2021) 16 g 6   mycophenolate (CELLCEPT) 250 MG capsule Take 3 capsules (750 mg total) by mouth 2 (two) times daily. (Patient not taking: Reported on 03/10/2021) 180 capsule 5   predniSONE (DELTASONE) 2.5 MG tablet Take 1 tablet (2.5 mg total) by mouth daily. (Patient not taking: Reported on 03/10/2021) 90 tablet 1   No facility-administered medications prior to visit.    Review of Systems  Review of Systems  Constitutional: Negative.   Respiratory:  Positive for shortness of breath. Negative for cough.     Physical Exam  BP 120/68 (BP Location: Left Arm, Patient Position: Sitting, Cuff Size: Normal)   Pulse 64   Temp 98.1 F (36.7 C) (Oral)   Ht _0  (1.473 m)   Wt 91 lb 12.8 oz (41.6 kg)   SpO2 96%   BMI 19.19 kg/m  Physical Exam Constitutional:      Appearance: Normal appearance.  HENT:     Head: Normocephalic and atraumatic.     Mouth/Throat:     Mouth: Mucous membranes are moist.     Pharynx: Oropharynx is clear.     Comments: Fissures to tongue  Cardiovascular:     Rate and Rhythm: Normal rate and regular rhythm.     Comments: +2- 3BLE edema Pulmonary:     Effort: Pulmonary effort is normal.     Breath sounds: Normal breath sounds. No wheezing, rhonchi or rales.     Comments: ? Possible faint rales, otherwise clear  Musculoskeletal:        General: Normal range of motion.     Comments: In WC  Skin:    General: Skin is warm and dry.  Neurological:     General: No focal deficit present.     Mental Status: She is alert and oriented to person, place, and time. Mental status is at baseline.     Lab Results:  CBC    Component Value Date/Time   WBC 10.0 02/19/2020 1511   RBC 3.56 (L) 02/19/2020 1511   HGB 11.6 (L)  02/19/2020 1511   HCT 35.2 (L) 02/19/2020 1511  PLT 391 02/19/2020 1511   MCV 98.9 02/19/2020 1511   MCH 32.6 02/19/2020 1511   MCHC 33.0 02/19/2020 1511   RDW 14.6 02/19/2020 1511   LYMPHSABS 0.6 (L) 06/11/2018 1122   MONOABS 0.5 06/11/2018 1122   EOSABS 0.0 06/11/2018 1122   BASOSABS 0.1 06/11/2018 1122    BMET    Component Value Date/Time   NA 139 02/19/2020 1511   K 3.5 02/19/2020 1511   CL 103 02/19/2020 1511   CO2 22 02/19/2020 1511   GLUCOSE 99 02/19/2020 1511   BUN 48 (H) 02/19/2020 1511   CREATININE 2.14 (H) 02/19/2020 1511   CALCIUM 7.9 (L) 02/19/2020 1511   GFRNONAA 23 (L) 02/19/2020 1511   GFRAA 35 (L) 04/02/2018 1257    BNP    Component Value Date/Time   BNP 231.9 (H) 04/02/2018 1257    ProBNP    Component Value Date/Time   PROBNP 1,534.0 (H) 09/27/2013 1130    Imaging: No results found.   Assessment & Plan:   Pleuritic pain Patient comes in today with chief complaint of left sided pleuritic pain x 5 days. Associated shortness of breath with exertion. Pain started after exercising/ lifting 2lb weights, likely musculoskeletal. Her pulmonologist from [place] recommended ED eval but she declined. VSS and exam were benign today, I have low suspicion for PE. Hx CHF and ILD. If D-dimer is elevated we will obtain stat CTA. If there is any abnormality noted on CXR would consider HRCT. She needs to establish with Dr. Vaughan Browner new-patient for ILD in 2-3 weeks.    Meghan Ehrich, NP 03/10/2021

## 2021-03-10 NOTE — Progress Notes (Signed)
Please let patient know CXR showed increased diffuse interstitial opacities, likely representing mild pulmonary edema. Small left pleural effusion.  She should follow up with cardiology regarding these findings, they may want to adjust her diuretics. Non-urgent. We are still waiting for D-dimer

## 2021-03-10 NOTE — Assessment & Plan Note (Addendum)
Patient comes in today with chief complaint of left sided pleuritic pain x 5 days. Associated shortness of breath with exertion. Pain started after exercising/ lifting 2lb weights, likely musculoskeletal. Her pulmonologist from [place] recommended ED eval but she declined. VSS and exam were benign today, I have low suspicion for PE. Hx CHF and ILD. If D-dimer is elevated we will obtain stat CTA. If there is any abnormality noted on CXR would consider HRCT. She needs to establish with Dr. Vaughan Browner new-patient for ILD in 2-3 weeks.

## 2021-03-11 ENCOUNTER — Emergency Department (HOSPITAL_COMMUNITY): Payer: Medicare PPO

## 2021-03-11 ENCOUNTER — Telehealth: Payer: Self-pay | Admitting: Primary Care

## 2021-03-11 ENCOUNTER — Ambulatory Visit: Payer: Medicare PPO | Admitting: Primary Care

## 2021-03-11 ENCOUNTER — Encounter (HOSPITAL_COMMUNITY): Payer: Self-pay

## 2021-03-11 ENCOUNTER — Inpatient Hospital Stay (HOSPITAL_COMMUNITY)
Admission: EM | Admit: 2021-03-11 | Discharge: 2021-03-15 | DRG: 175 | Disposition: A | Discharge status: Home / Self Care | Payer: Medicare PPO | Attending: Internal Medicine | Admitting: Internal Medicine

## 2021-03-11 ENCOUNTER — Emergency Department (HOSPITAL_BASED_OUTPATIENT_CLINIC_OR_DEPARTMENT_OTHER): Payer: Medicare PPO

## 2021-03-11 DIAGNOSIS — N184 Chronic kidney disease, stage 4 (severe): Secondary | ICD-10-CM | POA: Diagnosis present

## 2021-03-11 DIAGNOSIS — I2721 Secondary pulmonary arterial hypertension: Secondary | ICD-10-CM | POA: Diagnosis present

## 2021-03-11 DIAGNOSIS — Z79890 Hormone replacement therapy (postmenopausal): Secondary | ICD-10-CM

## 2021-03-11 DIAGNOSIS — Z87891 Personal history of nicotine dependence: Secondary | ICD-10-CM | POA: Diagnosis not present

## 2021-03-11 DIAGNOSIS — D638 Anemia in other chronic diseases classified elsewhere: Secondary | ICD-10-CM | POA: Diagnosis present

## 2021-03-11 DIAGNOSIS — N179 Acute kidney failure, unspecified: Secondary | ICD-10-CM | POA: Diagnosis present

## 2021-03-11 DIAGNOSIS — I82431 Acute embolism and thrombosis of right popliteal vein: Secondary | ICD-10-CM | POA: Diagnosis present

## 2021-03-11 DIAGNOSIS — M349 Systemic sclerosis, unspecified: Secondary | ICD-10-CM | POA: Diagnosis present

## 2021-03-11 DIAGNOSIS — I272 Pulmonary hypertension, unspecified: Secondary | ICD-10-CM | POA: Diagnosis present

## 2021-03-11 DIAGNOSIS — J439 Emphysema, unspecified: Secondary | ICD-10-CM | POA: Diagnosis present

## 2021-03-11 DIAGNOSIS — I2609 Other pulmonary embolism with acute cor pulmonale: Secondary | ICD-10-CM | POA: Diagnosis not present

## 2021-03-11 DIAGNOSIS — I82403 Acute embolism and thrombosis of unspecified deep veins of lower extremity, bilateral: Secondary | ICD-10-CM | POA: Diagnosis not present

## 2021-03-11 DIAGNOSIS — I5033 Acute on chronic diastolic (congestive) heart failure: Secondary | ICD-10-CM | POA: Diagnosis present

## 2021-03-11 DIAGNOSIS — J9611 Chronic respiratory failure with hypoxia: Secondary | ICD-10-CM | POA: Diagnosis not present

## 2021-03-11 DIAGNOSIS — Z20822 Contact with and (suspected) exposure to covid-19: Secondary | ICD-10-CM | POA: Diagnosis present

## 2021-03-11 DIAGNOSIS — Z886 Allergy status to analgesic agent status: Secondary | ICD-10-CM

## 2021-03-11 DIAGNOSIS — M109 Gout, unspecified: Secondary | ICD-10-CM | POA: Diagnosis present

## 2021-03-11 DIAGNOSIS — Z7952 Long term (current) use of systemic steroids: Secondary | ICD-10-CM

## 2021-03-11 DIAGNOSIS — I509 Heart failure, unspecified: Secondary | ICD-10-CM

## 2021-03-11 DIAGNOSIS — I7300 Raynaud's syndrome without gangrene: Secondary | ICD-10-CM | POA: Diagnosis present

## 2021-03-11 DIAGNOSIS — I82409 Acute embolism and thrombosis of unspecified deep veins of unspecified lower extremity: Secondary | ICD-10-CM | POA: Diagnosis present

## 2021-03-11 DIAGNOSIS — M810 Age-related osteoporosis without current pathological fracture: Secondary | ICD-10-CM | POA: Diagnosis present

## 2021-03-11 DIAGNOSIS — I130 Hypertensive heart and chronic kidney disease with heart failure and stage 1 through stage 4 chronic kidney disease, or unspecified chronic kidney disease: Secondary | ICD-10-CM | POA: Diagnosis present

## 2021-03-11 DIAGNOSIS — I959 Hypotension, unspecified: Secondary | ICD-10-CM | POA: Diagnosis present

## 2021-03-11 DIAGNOSIS — R918 Other nonspecific abnormal finding of lung field: Secondary | ICD-10-CM | POA: Diagnosis present

## 2021-03-11 DIAGNOSIS — I5032 Chronic diastolic (congestive) heart failure: Secondary | ICD-10-CM | POA: Diagnosis present

## 2021-03-11 DIAGNOSIS — Z79899 Other long term (current) drug therapy: Secondary | ICD-10-CM | POA: Diagnosis not present

## 2021-03-11 DIAGNOSIS — Z9981 Dependence on supplemental oxygen: Secondary | ICD-10-CM

## 2021-03-11 DIAGNOSIS — I2699 Other pulmonary embolism without acute cor pulmonale: Principal | ICD-10-CM | POA: Diagnosis present

## 2021-03-11 DIAGNOSIS — Z823 Family history of stroke: Secondary | ICD-10-CM

## 2021-03-11 DIAGNOSIS — Z8249 Family history of ischemic heart disease and other diseases of the circulatory system: Secondary | ICD-10-CM | POA: Diagnosis not present

## 2021-03-11 DIAGNOSIS — D649 Anemia, unspecified: Secondary | ICD-10-CM | POA: Diagnosis not present

## 2021-03-11 DIAGNOSIS — Z888 Allergy status to other drugs, medicaments and biological substances status: Secondary | ICD-10-CM

## 2021-03-11 DIAGNOSIS — R0602 Shortness of breath: Secondary | ICD-10-CM | POA: Diagnosis present

## 2021-03-11 DIAGNOSIS — I129 Hypertensive chronic kidney disease with stage 1 through stage 4 chronic kidney disease, or unspecified chronic kidney disease: Secondary | ICD-10-CM | POA: Diagnosis not present

## 2021-03-11 DIAGNOSIS — E039 Hypothyroidism, unspecified: Secondary | ICD-10-CM | POA: Diagnosis present

## 2021-03-11 DIAGNOSIS — I110 Hypertensive heart disease with heart failure: Secondary | ICD-10-CM | POA: Diagnosis not present

## 2021-03-11 DIAGNOSIS — Z885 Allergy status to narcotic agent status: Secondary | ICD-10-CM

## 2021-03-11 DIAGNOSIS — R0789 Other chest pain: Secondary | ICD-10-CM | POA: Diagnosis present

## 2021-03-11 DIAGNOSIS — M1990 Unspecified osteoarthritis, unspecified site: Secondary | ICD-10-CM | POA: Diagnosis present

## 2021-03-11 DIAGNOSIS — R079 Chest pain, unspecified: Secondary | ICD-10-CM | POA: Diagnosis not present

## 2021-03-11 DIAGNOSIS — D631 Anemia in chronic kidney disease: Secondary | ICD-10-CM | POA: Diagnosis present

## 2021-03-11 DIAGNOSIS — Z833 Family history of diabetes mellitus: Secondary | ICD-10-CM

## 2021-03-11 DIAGNOSIS — Z882 Allergy status to sulfonamides status: Secondary | ICD-10-CM

## 2021-03-11 DIAGNOSIS — J849 Interstitial pulmonary disease, unspecified: Secondary | ICD-10-CM | POA: Diagnosis present

## 2021-03-11 DIAGNOSIS — N1832 Chronic kidney disease, stage 3b: Secondary | ICD-10-CM | POA: Diagnosis not present

## 2021-03-11 DIAGNOSIS — E785 Hyperlipidemia, unspecified: Secondary | ICD-10-CM | POA: Diagnosis present

## 2021-03-11 DIAGNOSIS — J189 Pneumonia, unspecified organism: Secondary | ICD-10-CM | POA: Diagnosis not present

## 2021-03-11 DIAGNOSIS — J9621 Acute and chronic respiratory failure with hypoxia: Secondary | ICD-10-CM | POA: Diagnosis present

## 2021-03-11 DIAGNOSIS — E8779 Other fluid overload: Secondary | ICD-10-CM | POA: Diagnosis not present

## 2021-03-11 DIAGNOSIS — I2602 Saddle embolus of pulmonary artery with acute cor pulmonale: Secondary | ICD-10-CM | POA: Diagnosis not present

## 2021-03-11 DIAGNOSIS — I82451 Acute embolism and thrombosis of right peroneal vein: Secondary | ICD-10-CM | POA: Diagnosis not present

## 2021-03-11 LAB — RESP PANEL BY RT-PCR (FLU A&B, COVID) ARPGX2
Influenza A by PCR: NEGATIVE
Influenza B by PCR: NEGATIVE
SARS Coronavirus 2 by RT PCR: NEGATIVE

## 2021-03-11 LAB — CBC WITH DIFFERENTIAL/PLATELET
Abs Immature Granulocytes: 0.03 10*3/uL (ref 0.00–0.07)
Basophils Absolute: 0 10*3/uL (ref 0.0–0.1)
Basophils Relative: 0 %
Eosinophils Absolute: 0.1 10*3/uL (ref 0.0–0.5)
Eosinophils Relative: 1 %
HCT: 31.4 % — ABNORMAL LOW (ref 36.0–46.0)
Hemoglobin: 10.2 g/dL — ABNORMAL LOW (ref 12.0–15.0)
Immature Granulocytes: 0 %
Lymphocytes Relative: 4 %
Lymphs Abs: 0.4 10*3/uL — ABNORMAL LOW (ref 0.7–4.0)
MCH: 32.8 pg (ref 26.0–34.0)
MCHC: 32.5 g/dL (ref 30.0–36.0)
MCV: 101 fL — ABNORMAL HIGH (ref 80.0–100.0)
Monocytes Absolute: 0.4 10*3/uL (ref 0.1–1.0)
Monocytes Relative: 4 %
Neutro Abs: 9.3 10*3/uL — ABNORMAL HIGH (ref 1.7–7.7)
Neutrophils Relative %: 91 %
Platelets: 321 10*3/uL (ref 150–400)
RBC: 3.11 MIL/uL — ABNORMAL LOW (ref 3.87–5.11)
RDW: 15.4 % (ref 11.5–15.5)
WBC: 10.2 10*3/uL (ref 4.0–10.5)
nRBC: 0 % (ref 0.0–0.2)

## 2021-03-11 LAB — COMPREHENSIVE METABOLIC PANEL
ALT: 30 U/L (ref 0–44)
AST: 32 U/L (ref 15–41)
Albumin: 4.2 g/dL (ref 3.5–5.0)
Alkaline Phosphatase: 124 U/L (ref 38–126)
Anion gap: 11 (ref 5–15)
BUN: 62 mg/dL — ABNORMAL HIGH (ref 8–23)
CO2: 25 mmol/L (ref 22–32)
Calcium: 9.1 mg/dL (ref 8.9–10.3)
Chloride: 107 mmol/L (ref 98–111)
Creatinine, Ser: 1.44 mg/dL — ABNORMAL HIGH (ref 0.44–1.00)
GFR, Estimated: 36 mL/min — ABNORMAL LOW (ref >60)
Glucose, Bld: 148 mg/dL — ABNORMAL HIGH (ref 70–99)
Potassium: 3.9 mmol/L (ref 3.5–5.1)
Sodium: 143 mmol/L (ref 135–145)
Total Bilirubin: 0.6 mg/dL (ref 0.3–1.2)
Total Protein: 7.8 g/dL (ref 6.5–8.1)

## 2021-03-11 LAB — BRAIN NATRIURETIC PEPTIDE: B Natriuretic Peptide: 626.9 pg/mL — ABNORMAL HIGH (ref 0.0–100.0)

## 2021-03-11 LAB — TROPONIN I (HIGH SENSITIVITY)
Troponin I (High Sensitivity): 22 ng/L — ABNORMAL HIGH (ref <18)
Troponin I (High Sensitivity): 20 ng/L — ABNORMAL HIGH (ref <18)

## 2021-03-11 MED ORDER — VITAMIN B-12 1000 MCG PO TABS: 1000.0000 ug | ORAL_TABLET | Freq: Every day | ORAL | Status: DC

## 2021-03-11 MED ORDER — AMBRISENTAN 5 MG PO TABS
10.0000 mg | ORAL_TABLET | Freq: Every day | ORAL | Status: DC
  Administered 2021-03-12 – 2021-03-15 (×4): 10 mg via ORAL
  Filled 2021-03-11 (×4): qty 2

## 2021-03-11 MED ORDER — FERROUS SULFATE 325 (65 FE) MG PO TABS
324.0000 mg | ORAL_TABLET | Freq: Every day | ORAL | Status: DC
  Filled 2021-03-11: qty 1

## 2021-03-11 MED ORDER — PANTOPRAZOLE SODIUM 40 MG PO TBEC
40.0000 mg | DELAYED_RELEASE_TABLET | Freq: Every day | ORAL | Status: DC
  Administered 2021-03-12 – 2021-03-15 (×4): 40 mg via ORAL
  Filled 2021-03-11 (×4): qty 1

## 2021-03-11 MED ORDER — MELATONIN 5 MG PO TABS: 10.0000 mg | ORAL_TABLET | Freq: Every day | ORAL | Status: DC | PRN

## 2021-03-11 MED ORDER — CHLORTHALIDONE 25 MG PO TABS
25.0000 mg | ORAL_TABLET | Freq: Every day | ORAL | Status: DC
  Administered 2021-03-11 – 2021-03-12 (×2): 25 mg via ORAL
  Filled 2021-03-11 (×3): qty 1

## 2021-03-11 MED ORDER — TADALAFIL 20 MG PO TABS
40.0000 mg | ORAL_TABLET | Freq: Every evening | ORAL | Status: DC
  Administered 2021-03-11 – 2021-03-14 (×4): 40 mg via ORAL
  Filled 2021-03-11 (×6): qty 2

## 2021-03-11 MED ORDER — FUROSEMIDE 10 MG/ML IJ SOLN
40.0000 mg | Freq: Once | INTRAMUSCULAR | Status: AC
  Administered 2021-03-11: 40 mg via INTRAVENOUS
  Filled 2021-03-11: qty 4

## 2021-03-11 MED ORDER — LOPERAMIDE HCL 2 MG PO CAPS: 4.0000 mg | ORAL_CAPSULE | Freq: Four times a day (QID) | ORAL | Status: DC | PRN

## 2021-03-11 MED ORDER — FOLIC ACID 1 MG PO TABS
500.0000 ug | ORAL_TABLET | Freq: Every evening | ORAL | Status: DC
  Administered 2021-03-11 – 2021-03-14 (×4): 0.5 mg via ORAL
  Filled 2021-03-11 (×4): qty 1

## 2021-03-11 MED ORDER — ACETAMINOPHEN 650 MG RE SUPP: 650.0000 mg | Freq: Four times a day (QID) | RECTAL | Status: DC | PRN

## 2021-03-11 MED ORDER — AMLODIPINE BESYLATE 5 MG PO TABS
5.0000 mg | ORAL_TABLET | Freq: Every day | ORAL | Status: DC
  Administered 2021-03-12: 5 mg via ORAL
  Filled 2021-03-11: qty 1

## 2021-03-11 MED ORDER — PREDNISONE 5 MG PO TABS
5.0000 mg | ORAL_TABLET | Freq: Every day | ORAL | Status: DC
  Administered 2021-03-12 – 2021-03-15 (×4): 5 mg via ORAL
  Filled 2021-03-11 (×4): qty 1

## 2021-03-11 MED ORDER — ATORVASTATIN CALCIUM 20 MG PO TABS
20.0000 mg | ORAL_TABLET | Freq: Every day | ORAL | Status: DC
  Administered 2021-03-11 – 2021-03-14 (×4): 20 mg via ORAL
  Filled 2021-03-11 (×2): qty 1
  Filled 2021-03-11: qty 2
  Filled 2021-03-11: qty 1

## 2021-03-11 MED ORDER — TECHNETIUM TO 99M ALBUMIN AGGREGATED
4.4000 | Freq: Once | INTRAVENOUS | Status: AC
  Administered 2021-03-11: 4.4 via INTRAVENOUS

## 2021-03-11 MED ORDER — TORSEMIDE 10 MG PO TABS
10.0000 mg | ORAL_TABLET | Freq: Every day | ORAL | Status: DC
  Administered 2021-03-12: 10 mg via ORAL
  Filled 2021-03-11: qty 1

## 2021-03-11 MED ORDER — AMBRISENTAN 5 MG PO TABS: 10.0000 mg | ORAL_TABLET | Freq: Every day | ORAL | Status: DC

## 2021-03-11 MED ORDER — ACETAMINOPHEN 325 MG PO TABS: 650.0000 mg | ORAL_TABLET | Freq: Four times a day (QID) | ORAL | Status: DC | PRN

## 2021-03-11 MED ORDER — ENOXAPARIN SODIUM 40 MG/0.4ML IJ SOSY
40.0000 mg | PREFILLED_SYRINGE | INTRAMUSCULAR | Status: DC
  Administered 2021-03-11: 40 mg via SUBCUTANEOUS
  Filled 2021-03-11: qty 0.4

## 2021-03-11 MED ORDER — LEVOTHYROXINE SODIUM 50 MCG PO TABS
50.0000 ug | ORAL_TABLET | Freq: Every day | ORAL | Status: DC
  Administered 2021-03-12 – 2021-03-15 (×4): 50 ug via ORAL
  Filled 2021-03-11 (×4): qty 1

## 2021-03-11 NOTE — Progress Notes (Signed)
ANTICOAGULATION CONSULT NOTE - Initial Consult  Pharmacy Consult for Enoxaparin Indication: DVT  Allergies  Allergen Reactions   Losartan Other (See Comments) and Cough    High calcium count and renal problems   Ace Inhibitors Other (See Comments) and Cough    Dizziness and coughing    Sulfa Antibiotics Hives   Codeine Nausea Only   Heparin Anxiety and Other (See Comments)    Pt reports that they have a sensitivity towards heparin. Pt becomes depressed and anxious to the point of crying uncontrollably.     Patient Measurements: Height: _0  (147.3 cm) Weight: 41.6 kg (91 lb 12.8 oz) IBW/kg (Calculated) : 40.9 Heparin Dosing Weight:   Vital Signs: Temp: 97.8 F (36.6 C) (11/18 1321) Temp Source: Oral (11/18 1321) BP: 146/61 (11/18 1630) Pulse Rate: 74 (11/18 1630)  Labs: Recent Labs    03/10/21 1205 03/11/21 1424 03/11/21 1606  HGB  --  10.2*  --   HCT  --  31.4*  --   PLT  --  321  --   CREATININE 1.60* 1.44*  --   TROPONINIHS  --  22* 20*    Estimated Creatinine Clearance: 19.1 mL/min (A) (by C-G formula based on SCr of 1.44 mg/dL (H)).   Medical History: Past Medical History:  Diagnosis Date   Heart failure ([place])    HTN (hypertension)    Hyperlipidemia    Hypothyroidism    Kidney disease    Stage IV- Dr. Servando Salina -LOV 02-11-14   Osteoarthritis    Osteoporosis    Pulmonary hypertension ([facility])    Raynaud disease    reactive with cold and stress mostly fingers.   Scleroderma ([place])    lungs and kidneys   Shortness of breath dyspnea    with exertion-in Cardiac rehab program at [facility].    Medications:  Scheduled:  Infusions:   Assessment: 39 yoF presenting to ED with concern for possible PE.  She was seen by pulmonology outpatient for SOB, chest pain, elevated D-dimer.  She remains on her baseline oxygen 4L [place].  Pharmacy is consulted to dose enoxaparin for VTE.   VQ Scan: no evidence to strongly suggest the presence of pulmonary  embolism. Doppler:  +DVT involving the right popliteal vein.   Weight 41.6 kg SCr 1.44 with CrCl ~ 19 ml/min CBC: baseline Hgb low at 10.2, Plt WNL  Goal of Therapy:  Anti-Xa level 0.6-1 units/ml 4hrs after LMWH dose given Monitor platelets by anticoagulation protocol: Yes   Plan:  Enoxaparin 1 mg/kg (40 mg) [place] q24h Follow up renal function, CBC  Gretta Arab PharmD, BCPS Clinical Pharmacist [facility] main pharmacy [phone] 03/11/2021 6:53 PM

## 2021-03-11 NOTE — ED Provider Notes (Signed)
Emergency Medicine Provider Triage Evaluation Note  Meghan Wise , a 85 y.o. female  was evaluated in triage.  Pt complains of shortness of breath.  She has been seen by pulm recently for 5 days of left-sided chest pain and worsening shortness of breath.  She is on 4 L of oxygen at baseline.  She states that she feels okay otherwise.  She was seen at her pulmonologist yesterday and had a D-dimer that was elevated at 1.62.  It does not appear she has recently had any normal D-dimers. She was referred for a V/Q however due to shortage was told it was unable to be guaranteed for her to get it and based on chart review was told to come to the emergency room.  She did have labs obtained yesterday, her creatinine was elevated however slightly better than her baseline and her GFR was under 30 therefore making her ineligible for CTA PE study.  Review of Systems  Positive: Chest pain, shortness of breath Negative: Syncope, fever  Physical Exam  BP (!) 139/53 (BP Location: Left Arm)   Pulse 81   Temp 97.8 F (36.6 C) (Oral)   Resp (!) 27   Ht _0  (1.473 m)   Wt 41.6 kg   SpO2 99%   BMI 19.19 kg/m  Gen:   Awake, no distress   Resp:  Normal effort  MSK:   Moves extremities without difficulty  Other:  Normal. Speech.   Medical Decision Making  Medically screening exam initiated at 2:10 PM.  Appropriate orders placed.  Meghan Wise was informed that the remainder of the evaluation will be completed by another provider, this initial triage assessment does not replace that evaluation, and the importance of remaining in the ED until their evaluation is complete.  Ordered CXR, labs, VQ scan and Covid testing.    Note: Portions of this report may have been transcribed using voice recognition software. Every effort was made to ensure accuracy; however, inadvertent computerized transcription errors may be present    Lorin Glass, PA-C 03/11/21 1414    Regan Lemming, MD 03/11/21  [phone]

## 2021-03-11 NOTE — Progress Notes (Signed)
BLE venous duplex has been completed.  Preliminary findings given to Dr. Tomi Bamberger.    Results can be found under chart review under CV PROC. 03/11/2021 6:35 PM Sascha Palma RVT, RDMS

## 2021-03-11 NOTE — ED Triage Notes (Addendum)
Patient states she went to [facility] pulmonology yesterday with c/o increased SOB. Patient had a elevated D-dimer 1.62. Patient was told to come to the ED to r/o PE.  Patient has Reynaud's disease and unable to obtain an accurate O2 sat.

## 2021-03-11 NOTE — H&P (Signed)
History and Physical    ADAM DEMARY KGY:[id number] DOB: 14-Oct-1935 DOA: 03/11/2021  PCP: Carol Ada, MD  Patient coming from: Home.  Chief Complaint: Shortness of breath.  HPI: Meghan Wise is a 85 y.o. female with history of pulmonary artery hypertension, scleroderma, hypertension, anemia, chronic kidney disease stage III hypothyroidism presents to the ER after patient was experiencing some chest pain in the left side about a week ago and subsequently which patient became more short of breath.  Patient shortness of breath is present even at rest sometimes waking up in the night.  Had gone to the pulmonologist yesterday and plan was to get a CT angiogram of the chest to rule out PE but was instructed to come to the ER.  In the ER patient is now on 4 L oxygen which is her baseline but appears short of breath.  ED Course: Patient underwent VQ scan which shows small nonsegmental area of decreased perfusion within the left upper lobe which is showing possibility of PE but not definite and also Dopplers of the lower extremity was done which shows age-indeterminate DVT of the right popliteal vein.  Patient given the symptoms and also had mild edema of the lower extremities was given Lasix and started on Lovenox for DVT and possible PE.  COVID test was negative.  EKG shows normal sinus rhythm.  Labs are largely at baseline.  Review of Systems: As per HPI, rest all negative.   Past Medical History:  Diagnosis Date   Heart failure ([place])    HTN (hypertension)    Hyperlipidemia    Hypothyroidism    Kidney disease    Stage IV- Dr. Servando Salina -LOV 02-11-14   Osteoarthritis    Osteoporosis    Pulmonary hypertension (HCC)    Raynaud disease    reactive with cold and stress mostly fingers.   Scleroderma ([place])    lungs and kidneys   Shortness of breath dyspnea    with exertion-in Cardiac rehab program [facility].    Past Surgical History:  Procedure Laterality Date   BREAST BIOPSY  Bilateral    x    CARDIAC CATHETERIZATION     2'15 [facility]   CHOLECYSTECTOMY     CHOLECYSTECTOMY, LAPAROSCOPIC     COLONOSCOPY WITH PROPOFOL N/A 05/07/2014   Procedure: COLONOSCOPY WITH PROPOFOL;  Surgeon: Inda Castle, MD;  Location: [facility] ENDOSCOPY;  Service: Endoscopy;  Laterality: N/A;   ESOPHAGOGASTRODUODENOSCOPY (EGD) WITH PROPOFOL N/A 05/07/2014   Procedure: ESOPHAGOGASTRODUODENOSCOPY (EGD) WITH PROPOFOL;  Surgeon: Inda Castle, MD;  Location: [facility] ENDOSCOPY;  Service: Endoscopy;  Laterality: N/A;   HOT HEMOSTASIS N/A 05/07/2014   Procedure: HOT HEMOSTASIS (ARGON PLASMA COAGULATION/BICAP);  Surgeon: Inda Castle, MD;  Location: [facility];  Service: Endoscopy;  Laterality: N/A;  deuodenal bulb   SHOULDER ARTHROSCOPY W/ ROTATOR CUFF REPAIR Right      reports that she quit smoking about 33 years ago. Her smoking use included cigarettes. She has a 90.00 pack-year smoking history. She has never used smokeless tobacco. She reports current alcohol use. She reports that she does not use drugs.  Allergies  Allergen Reactions   Losartan Other (See Comments) and Cough    High calcium count and renal problems   Nsaids     Other reaction(s): Kidney Disorder   Ace Inhibitors Other (See Comments) and Cough    Dizziness and coughing    Sulfa Antibiotics Hives   Codeine Nausea Only   Heparin Anxiety and Other (See Comments)  Pt reports that they have a sensitivity towards heparin. Pt becomes depressed and anxious to the point of crying uncontrollably.     Family History  Problem Relation Age of Onset   Heart disease Mother    Heart disease Father    Cancer Other        husband--esophageal   Diabetes Son    Stroke Daughter    Breast cancer Neg Hx     Prior to Admission medications   Medication Sig Start Date End Date Taking? Authorizing Provider  Acetaminophen 500 MG capsule Take 1,000 mg by mouth every 6 (six) hours as needed for fever.   Yes [provider]   ambrisentan (LETAIRIS) 10 MG tablet Take 10 mg by mouth daily.    Yes [provider]  amLODipine (NORVASC) 5 MG tablet Take 5 mg by mouth daily. 01/24/21 01/24/22 Yes [provider]  atorvastatin (LIPITOR) 20 MG tablet Take 20 mg by mouth daily.   Yes [provider]  chlorthalidone (HYGROTON) 25 MG tablet Take 25 mg by mouth daily.   Yes [provider]  Cyanocobalamin (VITAMIN B-12 ER PO) Take 1 tablet by mouth daily.   Yes [provider]  denosumab (PROLIA) 60 MG/ML SOSY injection Inject 60 mg into the skin every 6 (six) months.   Yes [provider]  ferrous sulfate 324 MG TBEC Take 324 mg by mouth daily.   Yes [provider]  folic acid (FOLVITE) 856 MCG tablet Take 400 mcg by mouth every evening.    Yes [provider]  levothyroxine (SYNTHROID, LEVOTHROID) 50 MCG tablet Take 50 mcg by mouth every morning.   Yes [provider]  loperamide (IMODIUM A-D) 2 MG tablet Take 4 mg by mouth 4 (four) times daily as needed for diarrhea or loose stools.    Yes [provider]  Melatonin 10 MG TABS Take 10 mg by mouth daily as needed (sleep).    Yes [provider]  metroNIDAZOLE (FLAGYL) 500 MG tablet Take 500 mg by mouth 3 (three) times daily. 02/11/21  Yes [provider]  omeprazole (PRILOSEC) 20 MG capsule Take 20 mg by mouth daily.   Yes [provider]  OXYGEN Inhale 2-4 L into the lungs continuous. 4 L with exertion 2 L at night   Yes [provider]  predniSONE (DELTASONE) 10 MG tablet Take 5 mg by mouth daily with breakfast.   Yes [provider]  tadalafil (ADCIRCA/CIALIS) 20 MG tablet Take 40 mg by mouth every evening.    Yes [provider]  torsemide (DEMADEX) 10 MG tablet Take 10 mg by mouth daily.   Yes [provider]  allopurinol (ZYLOPRIM) 100 MG tablet Take 100 mg by mouth daily. Patient not taking: Reported on 03/10/2021  11/13/16   [provider]  atovaquone (MEPRON) 750 MG/5ML suspension Take 10 mLs (1,500 mg total) by mouth daily with breakfast. Patient not taking: Reported on 03/10/2021 05/10/18   Juanito Doom, MD  fluticasone (FLONASE) 50 MCG/ACT nasal spray Place 2 sprays into both nostrils daily. Patient not taking: Reported on 03/10/2021 01/17/21   Rozetta Nunnery, MD  mycophenolate (CELLCEPT) 250 MG capsule Take 3 capsules (750 mg total) by mouth 2 (two) times daily. Patient not taking: Reported on 03/10/2021 05/10/18   Juanito Doom, MD  predniSONE (DELTASONE) 2.5 MG tablet Take 1 tablet (2.5 mg total) by mouth daily. Patient not taking: Reported on 03/10/2021 12/27/18   Parrett, Fonnie Mu,  NP    Physical Exam: Constitutional: Moderately built and nourished. Vitals:   03/11/21 1329 03/11/21 1630 03/11/21 1845 03/11/21 1900  BP:  (!) 146/61 (!) 145/110 (!) 150/63  Pulse: 81 74  84  Resp:  (!) 21 (!) 33 (!) 23  Temp:      TempSrc:      SpO2: 99% 100%  100%  Weight:  41.6 kg    Height:  4' 10" (1.473 m)     Eyes: Anicteric no pallor. ENMT: No discharge from the ears eyes nose and mouth. Neck: No mass felt.  No JVD appreciated. Respiratory: No rhonchi or crepitations. Cardiovascular: S1-S2 heard. Abdomen: Soft nontender bowel sound present. Musculoskeletal: Mild edema to both lower extremities. Skin: No rash. Neurologic: Alert awake oriented to time place and person.  Moves all extremities. Psychiatric: Appears normal.  Normal affect.   Labs on Admission: I have personally reviewed following labs and imaging studies  CBC: Recent Labs  Lab 03/11/21 1424  WBC 10.2  NEUTROABS 9.3*  HGB 10.2*  HCT 31.4*  MCV 101.0*  PLT 790   Basic Metabolic Panel: Recent Labs  Lab 03/10/21 1205 03/11/21 1424  NA 144 143  K 3.5 3.9  CL 103 107  CO2 28 25  GLUCOSE 151* 148*  BUN 57* 62*  CREATININE 1.60* 1.44*  CALCIUM 8.8 9.1   GFR: Estimated Creatinine Clearance:  19.1 mL/min (A) (by C-G formula based on SCr of 1.44 mg/dL (H)). Liver Function Tests: Recent Labs  Lab 03/11/21 1424  AST 32  ALT 30  ALKPHOS 124  BILITOT 0.6  PROT 7.8  ALBUMIN 4.2   No results for input(s): LIPASE, AMYLASE in the last 168 hours. No results for input(s): AMMONIA in the last 168 hours. Coagulation Profile: No results for input(s): INR, PROTIME in the last 168 hours. Cardiac Enzymes: No results for input(s): CKTOTAL, CKMB, CKMBINDEX, TROPONINI in the last 168 hours. BNP (last 3 results) No results for input(s): PROBNP in the last 8760 hours. HbA1C: No results for input(s): HGBA1C in the last 72 hours. CBG: No results for input(s): GLUCAP in the last 168 hours. Lipid Profile: No results for input(s): CHOL, HDL, LDLCALC, TRIG, CHOLHDL, LDLDIRECT in the last 72 hours. Thyroid Function Tests: No results for input(s): TSH, T4TOTAL, FREET4, T3FREE, THYROIDAB in the last 72 hours. Anemia Panel: No results for input(s): VITAMINB12, FOLATE, FERRITIN, TIBC, IRON, RETICCTPCT in the last 72 hours. Urine analysis:    Component Value Date/Time   COLORURINE STRAW (A) 02/19/2020 1720   APPEARANCEUR CLEAR 02/19/2020 1720   LABSPEC 1.005 02/19/2020 1720   PHURINE 6.0 02/19/2020 1720   GLUCOSEU NEGATIVE 02/19/2020 1720   HGBUR NEGATIVE 02/19/2020 1720   BILIRUBINUR NEGATIVE 02/19/2020 1720   KETONESUR NEGATIVE 02/19/2020 1720   PROTEINUR NEGATIVE 02/19/2020 1720   UROBILINOGEN 0.2 05/24/2012 1450   NITRITE NEGATIVE 02/19/2020 1720   LEUKOCYTESUR NEGATIVE 02/19/2020 1720   Sepsis Labs: _0 (procalcitonin:4,lacticidven:4) ) Recent Results (from the past 240 hour(s))  Resp Panel by RT-PCR (Flu A&B, Covid) Nasopharyngeal Swab     Status: None   Collection Time: 03/11/21  2:11 PM   Specimen: Nasopharyngeal Swab; Nasopharyngeal(NP) swabs in vial transport medium  Result Value Ref Range Status   SARS Coronavirus 2 by RT PCR NEGATIVE NEGATIVE Final    Comment:  (NOTE) SARS-CoV-2 target nucleic acids are NOT DETECTED.  The SARS-CoV-2 RNA is generally detectable in upper respiratory specimens during the acute phase of infection. The lowest concentration of SARS-CoV-2 viral copies  this assay can detect is 138 copies/mL. A negative result does not preclude SARS-Cov-2 infection and should not be used as the sole basis for treatment or other patient management decisions. A negative result may occur with  improper specimen collection/handling, submission of specimen other than nasopharyngeal swab, presence of viral mutation(s) within the areas targeted by this assay, and inadequate number of viral copies(<138 copies/mL). A negative result must be combined with clinical observations, patient history, and epidemiological information. The expected result is Negative.  Fact Sheet for Patients:  EntrepreneurPulse.com.au  Fact Sheet for Healthcare Providers:  IncredibleEmployment.be  This test is no t yet approved or cleared by the [place] FDA and  has been authorized for detection and/or diagnosis of SARS-CoV-2 by FDA under an Emergency Use Authorization (EUA). This EUA will remain  in effect (meaning this test can be used) for the duration of the COVID-19 declaration under Section 564(b)(1) of the Act, 21 U.S.C.section 360bbb-3(b)(1), unless the authorization is terminated  or revoked sooner.       Influenza A by PCR NEGATIVE NEGATIVE Final   Influenza B by PCR NEGATIVE NEGATIVE Final    Comment: (NOTE) The Xpert Xpress SARS-CoV-2/FLU/RSV plus assay is intended as an aid in the diagnosis of influenza from Nasopharyngeal swab specimens and should not be used as a sole basis for treatment. Nasal washings and aspirates are unacceptable for Xpert Xpress SARS-CoV-2/FLU/RSV testing.  Fact Sheet for Patients: EntrepreneurPulse.com.au  Fact Sheet for Healthcare  Providers: IncredibleEmployment.be  This test is not yet approved or cleared by the [place] FDA and has been authorized for detection and/or diagnosis of SARS-CoV-2 by FDA under an Emergency Use Authorization (EUA). This EUA will remain in effect (meaning this test can be used) for the duration of the COVID-19 declaration under Section 564(b)(1) of the Act, 21 U.S.C. section 360bbb-3(b)(1), unless the authorization is terminated or revoked.  Performed at [facility], [place] [address], [place], [place] [address]      Radiological Exams on Admission: DG Chest 2 View  Result Date: 03/10/2021 CLINICAL DATA:  Left-sided pleuritic pain EXAM: CHEST - 2 VIEW COMPARISON:  Radiograph 12/23/2020, chest CT 12/24/2020 FINDINGS: Unchanged cardiomediastinal silhouette. There are diffuse interstitial opacities increased from prior exam. Scattered nodular opacities consistent with known pulmonary nodules better seen on prior CT. Small left pleural effusion. No pneumothorax. No acute osseous abnormality. IMPRESSION: Increased diffuse interstitial opacities, likely representing mild pulmonary edema. Small left pleural effusion. Electronically Signed   By: Maurine Simmering M.D.   On: 03/10/2021 12:09   NM Pulmonary Perfusion  Result Date: 03/11/2021 CLINICAL DATA:  Left-sided chest pain. EXAM: NUCLEAR MEDICINE PERFUSION LUNG SCAN TECHNIQUE: Perfusion images were obtained in multiple projections after intravenous injection of radiopharmaceutical. Ventilation scans intentionally deferred if perfusion scan and chest x-ray adequate for interpretation during COVID 19 epidemic. RADIOPHARMACEUTICALS:  4.4 mCi Tc-84mMAA IV COMPARISON:  September 27, 2013 FINDINGS: Predominant homogeneous distribution of tracer activity is seen throughout both lungs. A small, ill-defined nonsegmental area of mildly decreased perfusion is seen along the periphery of the upper left lung on the anterior  view. No segmental or subsegmental perfusion defects are identified. IMPRESSION: Small nonsegmental area of decreased perfusion suspected within the left upper lobe without evidence to strongly suggest the presence of pulmonary embolism. Electronically Signed   By: TVirgina NorfolkM.D.   On: 03/11/2021 15:57   VAS [place]OWER EXTREMITY VENOUS (DVT) (7a-7p)  Result Date: 03/11/2021  Lower Venous DVT Study Patient Name:  [name]  Date of Exam:   03/11/2021 Medical Rec #: [id number]          Accession #:    [id number] Date of Birth: May 15, 1935           Patient Gender: F Patient Age:   9 years Exam Location:  [facility] Procedure:      VAS [place] LOWER EXTREMITY VENOUS (DVT) Referring Phys: Wille Glaser KNAPP --------------------------------------------------------------------------------  Indications: Worsening shortness of breath - sent by DR.  Limitations: Calcific shadowing. Comparison Study: No previous exams Performing Technologist: Jody Hill RVT, RDMS  Examination Guidelines: A complete evaluation includes B-mode imaging, spectral Doppler, color Doppler, and power Doppler as needed of all accessible portions of each vessel. Bilateral testing is considered an integral part of a complete examination. Limited examinations for reoccurring indications may be performed as noted. The reflux portion of the exam is performed with the patient in reverse Trendelenburg.  +---------+---------------+---------+-----------+----------+-----------------+ RIGHT    CompressibilityPhasicitySpontaneityPropertiesThrombus Aging    +---------+---------------+---------+-----------+----------+-----------------+ CFV      Full           Yes      Yes                                    +---------+---------------+---------+-----------+----------+-----------------+ SFJ      Full                                                           +---------+---------------+---------+-----------+----------+-----------------+  FV Prox  Full           Yes      Yes                                    +---------+---------------+---------+-----------+----------+-----------------+ FV Mid   Full           Yes      Yes                                    +---------+---------------+---------+-----------+----------+-----------------+ FV DistalFull           Yes      Yes                                    +---------+---------------+---------+-----------+----------+-----------------+ PFV      Full                                                           +---------+---------------+---------+-----------+----------+-----------------+ POP      Partial        Yes      Yes                  Age Indeterminate +---------+---------------+---------+-----------+----------+-----------------+ PTV      Full                                                           +---------+---------------+---------+-----------+----------+-----------------+  PERO     Full                                                           +---------+---------------+---------+-----------+----------+-----------------+   +---------+---------------+---------+-----------+----------+--------------+ LEFT     CompressibilityPhasicitySpontaneityPropertiesThrombus Aging +---------+---------------+---------+-----------+----------+--------------+ CFV      Full           Yes      Yes                                 +---------+---------------+---------+-----------+----------+--------------+ SFJ      Full                                                        +---------+---------------+---------+-----------+----------+--------------+ FV Prox  Full           Yes      Yes                                 +---------+---------------+---------+-----------+----------+--------------+ FV Mid   Full           Yes      Yes                                 +---------+---------------+---------+-----------+----------+--------------+ FV  DistalFull           Yes      Yes                                 +---------+---------------+---------+-----------+----------+--------------+ PFV      Full                                                        +---------+---------------+---------+-----------+----------+--------------+ POP      Full           Yes      Yes                                 +---------+---------------+---------+-----------+----------+--------------+ PTV      Full                                                        +---------+---------------+---------+-----------+----------+--------------+ PERO     Full                                                        +---------+---------------+---------+-----------+----------+--------------+  Summary: BILATERAL: - No evidence of superficial venous thrombosis in the lower extremities, bilaterally. -No evidence of popliteal cyst, bilaterally. RIGHT: - Findings consistent with age indeterminate deep vein thrombosis involving the right popliteal vein. Subcutaneous edema seen in area of calf  LEFT: - There is no evidence of deep vein thrombosis in the lower extremity.  Subcutaneous edema seen in area of the calf.  *See table(s) above for measurements and observations.    Preliminary     EKG: Independently reviewed.  Normal sinus rhythm.  Assessment/Plan Principal Problem:   DVT (deep venous thrombosis) (HCC) Active Problems:   Hypothyroidism   Chronic diastolic CHF (congestive heart failure), NYHA class 4 (HCC)   CKD (chronic kidney disease) stage 4, GFR 15-29 ml/min (HCC)   Anemia of chronic disease   ILD (interstitial lung disease) (HCC)   Pulmonary hypertension (HCC)   CHF (congestive heart failure) (HCC)    Worsening shortness of breath likely could be from possible new PE as seen in the VQ scan pressurization defect with a DVT which is age-indeterminate with possible contribution from CHF for which patient was given a dose of IV Lasix and  started on Lovenox for DVT/PE.  We will get 2D echo consult pulmonologist.  Follow intake output Daily weights and metabolic panel.  Patient's last 2D echo in April of this year results of which are available in [place] showed EF of more than 55% with grade 2 diastolic dysfunction.  We will continue patient's torsemide. Pulmonary artery hypertension with scleroderma on tadalafil, Ambrisentan and steroids.  Follow 2D echo.  Patient uses 4 L oxygen daily. Hypertension on amlodipine and hydrochlorothiazide patient is also on torsemide. Chronic anemia on iron B12 and folate supplements. Chronic kidney disease stage IV creatinine appears to be at baseline. Hypothyroidism on Synthroid check TSH.   DVT prophylaxis: Lovenox full dose for DVT and PE. Code Status: Full code. Family Communication: Discussed with patient. Disposition Plan: Home when stable. Consults called: Will need to consult pulmonologist. Admission status: Observation.   Rise Patience MD [facility] Pager [phone].  If 7PM-7AM, please contact night-coverage [email or web address] Password [facility]  03/11/2021, 10:55 PM

## 2021-03-11 NOTE — Procedures (Signed)
Pharmacy Consult for Pulmonary Hypertension Treatment   Indication - Continuation of prior to admission medication   Patient is 85 y.o.  with history of PAH on chronic Ambrisentan (Letairis) PTA and will be continued while hospitalized.   Continuing this medication order as an inpatient requires that monitoring parameters per REMS requirements must be met.  Chronic therapy is under the supervision of Rajagopal (MD name) who is enrolled in the REMS program and is being notified of continuation of therapy. A staff message in EPIC has been sent notifying the certified prescriber.  Per patient report has previously been educated on Hepatotoxicity . On admission pregnancy risk has been assessed and no monitoring required. Hepatic function has been evaluated. AST/ALT appropriate to continue medication at this time.  Hepatic Function Latest Ref Rng & Units 03/11/2021 02/19/2020 03/29/2018  Total Protein 6.5 - 8.1 g/dL 7.8 7.1 7.4  Albumin 3.5 - 5.0 g/dL 4.2 3.4(L) 4.0  AST 15 - 41 U/L 32 24 29  ALT 0 - 44 U/L 30 15 41  Alk Phosphatase 38 - 126 U/L 124 76 130(H)  Total Bilirubin 0.3 - 1.2 mg/dL 0.6 0.4 0.6  Bilirubin, Direct 0.0 - 0.3 mg/dL - - -    If any question arise or pregnancy is identified during hospitalization, contact for bosentan and macitentan: [phone]; ambrisentan: [phone].  Thank for you allowing [place] to participate in the care of this patient.   Dolly Rias RPh 03/11/2021, 11:27 PM

## 2021-03-11 NOTE — ED Provider Notes (Signed)
[facility] DEPT Provider Note   CSN: [id number] Arrival date & time: 03/11/21  1227     History Chief Complaint  Patient presents with   Shortness of Breath   Abnormal Lab    Meghan Wise is a 85 y.o. female.   Shortness of Breath Abnormal Lab  Patient has a history of scleroderma, hypertension, chronic kidney disease, CHF, and pulmonary hypertension.  She has been followed at [facility] as well as [facility].  Patient states she has noted some left-sided pain in her chest for the past 5 days.  Its intermittent and sharp.  She is also noticed some increased shortness of breath with activity.  Patient states the symptoms started after exercising recently.  Patient is not had any fevers.  She has not had any vomiting or diarrhea.  She does have chronic leg swelling that she associates with her pulmonary hypertension but has not noticed any new changes with that.  Patient was seen yesterday in the office by NP Con[facility].    Patient had a D-dimer test yesterday.  It was noted to be elevated however her creatinine was also elevated so a VQ scan was ordered.  Patient was sent to the ED to have that test due to the shortage of regions.  Past Medical History:  Diagnosis Date   Heart failure ([place])    HTN (hypertension)    Hyperlipidemia    Hypothyroidism    Kidney disease    Stage IV- Dr. Servando Salina -LOV 02-11-14   Osteoarthritis    Osteoporosis    Pulmonary hypertension (HCC)    Raynaud disease    reactive with cold and stress mostly fingers.   Scleroderma ([place])    lungs and kidneys   Shortness of breath dyspnea    with exertion-in Cardiac rehab program at [facility].    Patient Active Problem List   Diagnosis Date Noted   Pleuritic pain 03/10/2021   ILD (interstitial lung disease) ([place]) 12/27/2018   Pulmonary hypertension ([place]) 12/27/2018   Dyspnea 04/04/2018   SOB (shortness of breath) 04/02/2018   Scleroderma ([place]) 04/02/2018    PNA (pneumonia) 07/22/2016   Acute on chronic respiratory failure with hypoxia ([place]) 07/21/2016   Community acquired pneumonia 07/21/2016   CKD (chronic kidney disease) stage 4, GFR 15-29 ml/min (HCC) 07/21/2016   Anemia of chronic disease 07/21/2016   AVM (arteriovenous malformation) of duodenum, acquired 05/07/2014   Chronic diastolic CHF (congestive heart failure), NYHA class 4 ([place]) 05/28/2012   HTN (hypertension)    Raynaud disease    Hypothyroidism     Past Surgical History:  Procedure Laterality Date   BREAST BIOPSY Bilateral    x    CARDIAC CATHETERIZATION     2'15 [facility]   CHOLECYSTECTOMY     CHOLECYSTECTOMY, LAPAROSCOPIC     COLONOSCOPY WITH PROPOFOL N/A 05/07/2014   Procedure: COLONOSCOPY WITH PROPOFOL;  Surgeon: Inda Castle, MD;  Location: [facility] ENDOSCOPY;  Service: Endoscopy;  Laterality: N/A;   ESOPHAGOGASTRODUODENOSCOPY (EGD) WITH PROPOFOL N/A 05/07/2014   Procedure: ESOPHAGOGASTRODUODENOSCOPY (EGD) WITH PROPOFOL;  Surgeon: Inda Castle, MD;  Location: [facility] ENDOSCOPY;  Service: Endoscopy;  Laterality: N/A;   HOT HEMOSTASIS N/A 05/07/2014   Procedure: HOT HEMOSTASIS (ARGON PLASMA COAGULATION/BICAP);  Surgeon: Inda Castle, MD;  Location: [facility];  Service: Endoscopy;  Laterality: N/A;  deuodenal bulb   SHOULDER ARTHROSCOPY W/ ROTATOR CUFF REPAIR Right      OB History   No obstetric history on file.  Family History  Problem Relation Age of Onset   Heart disease Mother    Heart disease Father    Cancer Other        husband--esophageal   Diabetes Son    Stroke Daughter    Breast cancer Neg Hx     Social History   Tobacco Use   Smoking status: Former    Packs/day: 1.50    Years: 60.00    Pack years: 90.00    Types: Cigarettes    Quit date: 04/25/1987    Years since quitting: 33.9   Smokeless tobacco: Never  Vaping Use   Vaping Use: Never used  Substance Use Topics   Alcohol use: Yes    Alcohol/week: 0.0 standard drinks    Comment:  OCCASIONALLY- rare   Drug use: No    Home Medications Prior to Admission medications   Medication Sig Start Date End Date Taking? Authorizing Provider  allopurinol (ZYLOPRIM) 100 MG tablet Take 100 mg by mouth daily. Patient not taking: Reported on 03/10/2021 11/13/16   [provider]  ambrisentan (LETAIRIS) 10 MG tablet Take 10 mg by mouth daily.     [provider]  atorvastatin (LIPITOR) 20 MG tablet Take 20 mg by mouth daily.    [provider]  atovaquone (MEPRON) 750 MG/5ML suspension Take 10 mLs (1,500 mg total) by mouth daily with breakfast. Patient not taking: Reported on 03/10/2021 05/10/18   Juanito Doom, MD  chlorthalidone (HYGROTON) 25 MG tablet Take 25 mg by mouth daily. Patient not taking: Reported on 03/10/2021    [provider]  Cholecalciferol (VITAMIN D3) 2000 units capsule Take 2,000 Units by mouth daily. Patient not taking: Reported on 03/10/2021    [provider]  Cyanocobalamin (VITAMIN B-12 ER PO) Take 1 tablet by mouth daily. Patient not taking: Reported on 03/10/2021    [provider]  denosumab (PROLIA) 60 MG/ML SOSY injection Inject 60 mg into the skin every 6 (six) months. Patient not taking: Reported on 03/10/2021    [provider]  ferrous sulfate 324 MG TBEC Take 324 mg by mouth daily.    [provider]  fluticasone (FLONASE) 50 MCG/ACT nasal spray Place 2 sprays into both nostrils daily. Patient not taking: Reported on 03/10/2021 01/17/21   Rozetta Nunnery, MD  folic acid (FOLVITE) 378 MCG tablet Take 400 mcg by mouth every evening.     [provider]  levothyroxine (SYNTHROID, LEVOTHROID) 50 MCG tablet Take 50 mcg by mouth every morning.    [provider]  loperamide (IMODIUM A-D) 2 MG tablet Take 4 mg by mouth 4 (four) times daily as needed for diarrhea or loose stools.     [provider]  Melatonin 10 MG TABS Take 10 mg by mouth daily as  needed (sleep).     [provider]  mycophenolate (CELLCEPT) 250 MG capsule Take 3 capsules (750 mg total) by mouth 2 (two) times daily. Patient not taking: Reported on 03/10/2021 05/10/18   Juanito Doom, MD  omeprazole (PRILOSEC) 20 MG capsule Take 20 mg by mouth daily.    [provider]  OXYGEN Inhale 2-4 L into the lungs at bedtime. 4 L with exertion 2 L at night    [provider]  predniSONE (DELTASONE) 2.5 MG tablet Take 1 tablet (2.5 mg total) by mouth daily. Patient not taking: Reported on 03/10/2021 12/27/18   Parrett, Fonnie Mu, NP  tadalafil (ADCIRCA/CIALIS) 20 MG tablet Take 40 mg  by mouth every evening.     [provider]  torsemide (DEMADEX) 10 MG tablet Take 10 mg by mouth See admin instructions. Three times a week    [provider]    Allergies    Losartan, Ace inhibitors, Sulfa antibiotics, Codeine, and Heparin  Review of Systems   Review of Systems  Respiratory:  Positive for shortness of breath.   All other systems reviewed and are negative.  Physical Exam Updated Vital Signs BP (!) 150/63   Pulse 84   Temp 97.8 F (36.6 C) (Oral)   Resp (!) 23   Ht 1.473 m (_0 )   Wt 41.6 kg   SpO2 100%   BMI 19.19 kg/m   Physical Exam Vitals and nursing note reviewed.  Constitutional:      Appearance: She is well-developed.     Comments: Thin, underweight  HENT:     Head: Normocephalic and atraumatic.     Right Ear: External ear normal.     Left Ear: External ear normal.  Eyes:     General: No scleral icterus.       Right eye: No discharge.        Left eye: No discharge.     Conjunctiva/sclera: Conjunctivae normal.  Neck:     Trachea: No tracheal deviation.  Cardiovascular:     Rate and Rhythm: Normal rate and regular rhythm.  Pulmonary:     Effort: Pulmonary effort is normal. No respiratory distress.     Breath sounds: Normal breath sounds. No stridor. No wheezing or rales.  Abdominal:     General: Bowel  sounds are normal. There is no distension.     Palpations: Abdomen is soft.     Tenderness: There is no abdominal tenderness. There is no guarding or rebound.  Musculoskeletal:        General: No tenderness or deformity.     Cervical back: Neck supple.     Right lower leg: No tenderness. Edema present.     Left lower leg: No tenderness. Edema present.  Skin:    General: Skin is warm and dry.     Coloration: Skin is cyanotic (Fingertips).     Findings: No rash.  Neurological:     General: No focal deficit present.     Mental Status: She is alert.     Cranial Nerves: No cranial nerve deficit (no facial droop, extraocular movements intact, no slurred speech).     Sensory: No sensory deficit.     Motor: No abnormal muscle tone or seizure activity.     Coordination: Coordination normal.  Psychiatric:        Mood and Affect: Mood normal.    ED Results / Procedures / Treatments   Labs (all labs ordered are listed, but only abnormal results are displayed) Labs Reviewed  CBC WITH DIFFERENTIAL/PLATELET - Abnormal; Notable for the following components:      Result Value   RBC 3.11 (*)    Hemoglobin 10.2 (*)    HCT 31.4 (*)    MCV 101.0 (*)    Neutro Abs 9.3 (*)    Lymphs Abs 0.4 (*)    All other components within normal limits  COMPREHENSIVE METABOLIC PANEL - Abnormal; Notable for the following components:   Glucose, Bld 148 (*)    BUN 62 (*)    Creatinine, Ser 1.44 (*)    GFR, Estimated 36 (*)    All other components within normal limits  BRAIN NATRIURETIC PEPTIDE -  Abnormal; Notable for the following components:   B Natriuretic Peptide 626.9 (*)    All other components within normal limits  TROPONIN I (HIGH SENSITIVITY) - Abnormal; Notable for the following components:   Troponin I (High Sensitivity) 22 (*)    All other components within normal limits  TROPONIN I (HIGH SENSITIVITY) - Abnormal; Notable for the following components:   Troponin I (High Sensitivity) 20 (*)    All  other components within normal limits  RESP PANEL BY RT-PCR (FLU A&B, COVID) [id number]    EKG EKG Interpretation  Date/Time:  Friday March 11 2021 13:21:04 EST Ventricular Rate:  85 PR Interval:  136 QRS Duration: 99 QT Interval:  415 QTC Calculation: 494 R Axis:   63 Text Interpretation: Sinus rhythm Left atrial enlargement Anteroseptal infarct, age indeterminate No significant change since last tracing Confirmed by Dorie Rank [phone]) on 03/11/2021 5:51:41 PM  Radiology DG Chest 2 View  Result Date: 03/10/2021 CLINICAL DATA:  Left-sided pleuritic pain EXAM: CHEST - 2 VIEW COMPARISON:  Radiograph 12/23/2020, chest CT 12/24/2020 FINDINGS: Unchanged cardiomediastinal silhouette. There are diffuse interstitial opacities increased from prior exam. Scattered nodular opacities consistent with known pulmonary nodules better seen on prior CT. Small left pleural effusion. No pneumothorax. No acute osseous abnormality. IMPRESSION: Increased diffuse interstitial opacities, likely representing mild pulmonary edema. Small left pleural effusion. Electronically Signed   By: Maurine Simmering M.D.   On: 03/10/2021 12:09   NM Pulmonary Perfusion  Result Date: 03/11/2021 CLINICAL DATA:  Left-sided chest pain. EXAM: NUCLEAR MEDICINE PERFUSION LUNG SCAN TECHNIQUE: Perfusion images were obtained in multiple projections after intravenous injection of radiopharmaceutical. Ventilation scans intentionally deferred if perfusion scan and chest x-ray adequate for interpretation during COVID 19 epidemic. RADIOPHARMACEUTICALS:  4.4 mCi Tc-16mMAA IV COMPARISON:  September 27, 2013 FINDINGS: Predominant homogeneous distribution of tracer activity is seen throughout both lungs. A small, ill-defined nonsegmental area of mildly decreased perfusion is seen along the periphery of the upper left lung on the anterior view. No segmental or subsegmental perfusion defects are identified. IMPRESSION: Small nonsegmental area of decreased  perfusion suspected within the left upper lobe without evidence to strongly suggest the presence of pulmonary embolism. Electronically Signed   By: TVirgina NorfolkM.D.   On: 03/11/2021 15:57   VAS UK[place]LOWER EXTREMITY VENOUS (DVT) (7a-7p)  Result Date: 03/11/2021  Lower Venous DVT Study Patient Name:  Meghan Wise Date of Exam:   03/11/2021 Medical Rec #: [id number]         Accession #:    [id number] of Birth: [birth year]          Patient Gender: F Patient Age:   89years [facility]      VAS [place]OWER EXTREMITY VENOUS (DVT) Referring Phys: JWille GlaserKNAPP --------------------------------------------------------------------------------  Indications: Worsening shortness of breath - sent by DR.  Limitations: Calcific shadowing. Comparison Study: No previous exams Performing Technologist: Jody Hill RVT, RDMS  Examination Guidelines: A complete evaluation includes B-mode imaging, spectral Doppler, color Doppler, and power Doppler as needed of all accessible portions of each vessel. Bilateral testing is considered an integral part of a complete examination. Limited examinations for reoccurring indications may be performed as noted. The reflux portion of the exam is performed with the patient in reverse Trendelenburg.  +---------+---------------+---------+-----------+----------+-----------------+ RIGHT    CompressibilityPhasicitySpontaneityPropertiesThrombus Aging    +---------+---------------+---------+-----------+----------+-----------------+ CFV      Full  Yes      Yes                                    +---------+---------------+---------+-----------+----------+-----------------+ SFJ      Full                                                           +---------+---------------+---------+-----------+----------+-----------------+ FV Prox  Full           Yes      Yes                                     +---------+---------------+---------+-----------+----------+-----------------+ FV Mid   Full           Yes      Yes                                    +---------+---------------+---------+-----------+----------+-----------------+ FV DistalFull           Yes      Yes                                    +---------+---------------+---------+-----------+----------+-----------------+ PFV      Full                                                           +---------+---------------+---------+-----------+----------+-----------------+ POP      Partial        Yes      Yes                  Age Indeterminate +---------+---------------+---------+-----------+----------+-----------------+ PTV      Full                                                           +---------+---------------+---------+-----------+----------+-----------------+ PERO     Full                                                           +---------+---------------+---------+-----------+----------+-----------------+   +---------+---------------+---------+-----------+----------+--------------+ LEFT     CompressibilityPhasicitySpontaneityPropertiesThrombus Aging +---------+---------------+---------+-----------+----------+--------------+ CFV      Full           Yes      Yes                                 +---------+---------------+---------+-----------+----------+--------------+ SFJ      Full                                                        +---------+---------------+---------+-----------+----------+--------------+  FV Prox  Full           Yes      Yes                                 +---------+---------------+---------+-----------+----------+--------------+ FV Mid   Full           Yes      Yes                                 +---------+---------------+---------+-----------+----------+--------------+ FV DistalFull           Yes      Yes                                  +---------+---------------+---------+-----------+----------+--------------+ PFV      Full                                                        +---------+---------------+---------+-----------+----------+--------------+ POP      Full           Yes      Yes                                 +---------+---------------+---------+-----------+----------+--------------+ PTV      Full                                                        +---------+---------------+---------+-----------+----------+--------------+ PERO     Full                                                        +---------+---------------+---------+-----------+----------+--------------+    Summary: BILATERAL: - No evidence of superficial venous thrombosis in the lower extremities, bilaterally. -No evidence of popliteal cyst, bilaterally. RIGHT: - Findings consistent with age indeterminate deep vein thrombosis involving the right popliteal vein. Subcutaneous edema seen in area of calf  LEFT: - There is no evidence of deep vein thrombosis in the lower extremity.  Subcutaneous edema seen in area of the calf.  *See table(s) above for measurements and observations.    Preliminary     Procedures Procedures   Medications Ordered in ED Medications  enoxaparin (LOVENOX) injection 40 mg (40 mg Subcutaneous Given 03/11/21 1857)  furosemide (LASIX) injection 40 mg (has no administration in time range)  technetium albumin aggregated (MAA) injection solution 4.4 millicurie (4.4 millicuries Intravenous Contrast Given 03/11/21 1536)    ED Course  I have reviewed the triage vital signs and the nursing notes.  Pertinent labs & imaging results that were available during my care of the patient were reviewed by me and considered in my medical decision making (see chart for details).  Clinical Course as of 03/11/21 4496  Ludwig Clarks Mar 11, 2021  1631 Comprehensive metabolic panel(!) Creatinine decreased from yesterday.  BUN increased  [JK]  1631 Troponin I (High Sensitivity)(!) Troponin slightly elevated [JK]  1631 Brain natriuretic peptide(!) BNP elevated compared to previous [JK]  1632 CBC with Differential(!) Hemoglobin stable compared to previous [JK]  1633 Pulmonary perfusion scan.  IMPRESSION: Small nonsegmental area of decreased perfusion suspected within the left upper lobe without evidence to strongly suggest the presence of pulmonary embolism.   [LO]  7564 Chest x-ray from yesterday reviewed.  Likely pulmonary edema [JK]  1753 DVT noted on doppler study [JK]    Clinical Course User Index [JK] Dorie Rank, MD   MDM Rules/Calculators/A&P                          Patient presented to the ED for evaluation of shortness of breath.  She had a positive D-dimer as an outpatient.  VQ scan was performed because of her elevated D-dimer yesterday.  Patient's pulmonary perfusion test does show evidence of a perfusion defect but not felt to be highly suggestive of a pulmonary embolism.  Patient also noted to have slight elevation in BNP and troponin.  Patient may have a component of CHF.  Pneumonia is also a concern.  Cannot completely exclude the possibility of pulmonary embolism with the perfusion scan.  Unfortunately patient did not have a ventilation scan to assess for a matched defect.  Discussed options with patient.  Her creatinine is improved.  We could consider low-dose contrast CT however could also consider doing Doppler studies of the legs and see what her chest x-ray findings show.  Patient prefers to proceed with the latter  Patient's Doppler study shows evidence of age-indeterminate DVT.  Patient's chest x-ray yesterday showed findings suggestive of CHF.  Certainly is a possibility that patient does have a small PE with her DVT finding.  We will consult pharmacy regarding Lovenox dosing.  I will give the patient a dose of Lasix considering her CHF exacerbation.  With possible for PE and her CHF findings I will  consult the medical service for admission further treatment Final Clinical Impression(s) / ED Diagnoses Final diagnoses:  Acute on chronic congestive heart failure, unspecified heart failure type ([place])  Acute deep vein thrombosis (DVT) of popliteal vein of right lower extremity ([place])     Dorie Rank, MD 03/11/21 Einar Crow

## 2021-03-11 NOTE — Telephone Encounter (Signed)
[facility] put in order for VQ scan for pt late yesterday - pt of BW's and has elevated d-dimer.  Vallarie Mare called central scheduling at 4:45 and they could not get anyone in [place] and asked her to call back today.  I called this morning and was on hold for scheduler to check with Nuc Med.  Nuc Med states shortage of contrast and can't guarantee pt can be seen on Monday.  They suggested if we were concerned about PE that pt will need to go to ER and will be evaluated there and can determine what is needed.  I checked with BW this morning and she states pt should go to ER.  Placed telephone message per her request and routed to [facility] to make pt aware to go to ER.

## 2021-03-11 NOTE — Telephone Encounter (Signed)
Called and spoke with pt letting her know the info stated by Judeen Hammans and that BW advised she go to the ED to be evaluated.  I let her know that we were advising her to go to the ED due to the elevated labwork as we needed to make sure there was no PE. Pt verbalizesd understanding. Nothing further needed.

## 2021-03-12 ENCOUNTER — Observation Stay (HOSPITAL_COMMUNITY): Payer: Medicare PPO

## 2021-03-12 ENCOUNTER — Other Ambulatory Visit: Payer: Self-pay

## 2021-03-12 DIAGNOSIS — I272 Pulmonary hypertension, unspecified: Secondary | ICD-10-CM

## 2021-03-12 DIAGNOSIS — N184 Chronic kidney disease, stage 4 (severe): Secondary | ICD-10-CM

## 2021-03-12 DIAGNOSIS — J9611 Chronic respiratory failure with hypoxia: Secondary | ICD-10-CM | POA: Diagnosis not present

## 2021-03-12 DIAGNOSIS — I2602 Saddle embolus of pulmonary artery with acute cor pulmonale: Secondary | ICD-10-CM

## 2021-03-12 DIAGNOSIS — I2609 Other pulmonary embolism with acute cor pulmonale: Secondary | ICD-10-CM | POA: Diagnosis not present

## 2021-03-12 DIAGNOSIS — I82451 Acute embolism and thrombosis of right peroneal vein: Secondary | ICD-10-CM

## 2021-03-12 LAB — ECHOCARDIOGRAM COMPLETE
AR max vel: 1.98 cm2
AV Area VTI: 2.06 cm2
AV Area mean vel: 1.86 cm2
AV Mean grad: 8 mmHg
AV Peak grad: 15.1 mmHg
Ao pk vel: 1.94 m/s
Area-P 1/2: 4.06 cm2
Height: 58 in
P 1/2 time: 401 msec
S' Lateral: 2.5 cm
Weight: 1468.8 oz

## 2021-03-12 LAB — COMPREHENSIVE METABOLIC PANEL
ALT: 23 U/L (ref 0–44)
AST: 25 U/L (ref 15–41)
Albumin: 3.6 g/dL (ref 3.5–5.0)
Alkaline Phosphatase: 94 U/L (ref 38–126)
Anion gap: 11 (ref 5–15)
BUN: 62 mg/dL — ABNORMAL HIGH (ref 8–23)
CO2: 29 mmol/L (ref 22–32)
Calcium: 8.6 mg/dL — ABNORMAL LOW (ref 8.9–10.3)
Chloride: 105 mmol/L (ref 98–111)
Creatinine, Ser: 1.56 mg/dL — ABNORMAL HIGH (ref 0.44–1.00)
GFR, Estimated: 33 mL/min — ABNORMAL LOW (ref >60)
Glucose, Bld: 76 mg/dL (ref 70–99)
Potassium: 3.5 mmol/L (ref 3.5–5.1)
Sodium: 145 mmol/L (ref 135–145)
Total Bilirubin: 0.7 mg/dL (ref 0.3–1.2)
Total Protein: 6.7 g/dL (ref 6.5–8.1)

## 2021-03-12 LAB — MAGNESIUM: Magnesium: 2.2 mg/dL (ref 1.7–2.4)

## 2021-03-12 LAB — TSH: TSH: 3.442 u[IU]/mL (ref 0.350–4.500)

## 2021-03-12 MED ORDER — FERROUS SULFATE 325 (65 FE) MG PO TABS
324.0000 mg | ORAL_TABLET | Freq: Every day | ORAL | Status: DC
  Administered 2021-03-12 – 2021-03-14 (×4): 324 mg via ORAL
  Filled 2021-03-12 (×3): qty 1

## 2021-03-12 MED ORDER — FUROSEMIDE 10 MG/ML IJ SOLN
40.0000 mg | Freq: Two times a day (BID) | INTRAMUSCULAR | Status: DC
  Administered 2021-03-12 – 2021-03-13 (×2): 40 mg via INTRAVENOUS
  Filled 2021-03-12 (×2): qty 4

## 2021-03-12 MED ORDER — ENOXAPARIN SODIUM 40 MG/0.4ML IJ SOSY
40.0000 mg | PREFILLED_SYRINGE | INTRAMUSCULAR | Status: DC
  Administered 2021-03-12 – 2021-03-13 (×2): 40 mg via SUBCUTANEOUS
  Filled 2021-03-12 (×2): qty 0.4

## 2021-03-12 MED ORDER — ORAL CARE MOUTH RINSE
15.0000 mL | Freq: Two times a day (BID) | OROMUCOSAL | Status: DC
  Administered 2021-03-12 – 2021-03-14 (×5): 15 mL via OROMUCOSAL

## 2021-03-12 MED ORDER — VITAMIN B-12 1000 MCG PO TABS
1000.0000 ug | ORAL_TABLET | Freq: Every day | ORAL | Status: DC
  Administered 2021-03-12 – 2021-03-14 (×4): 1000 ug via ORAL
  Filled 2021-03-12 (×5): qty 1

## 2021-03-12 MED ORDER — ENOXAPARIN SODIUM 40 MG/0.4ML IJ SOSY: 40.0000 mg | PREFILLED_SYRINGE | Freq: Two times a day (BID) | INTRAMUSCULAR | Status: DC

## 2021-03-12 NOTE — Consult Note (Signed)
Name: Meghan Wise MRN: [id number] DOB: 1936-02-25    ADMISSION DATE:  03/11/2021 CONSULTATION DATE:  03/12/2021   REFERRING MD :  Shelbie Proctor  CHIEF COMPLAINT: Left-sided chest pain   HISTORY OF PRESENT ILLNESS: 85 year old remote smoker with scleroderma/ILD and pulmonary hypertension and chronic hypoxic respiratory failure on 4 L of oxygen.  She used to follow Dr. Lake Bells in our office but over the last 2 years has been following up with Dr. Daine Gravel at [facility] pulmonary hypertension clinic .  I have reviewed last [facility] records from October and her pulmonary office acute visit from yesterday At baseline she gets dyspneic on ambulating a few yards-NYHA class III symptoms she has been doing her exercise and trying to gain weight and has gained 10 pounds over the last 1 month.  She used to be on 2 L at rest and 4 L on exertion but now uses 4 L at all times.  She was unable to tolerate CellCept follows with rheumatology, now on 5 mg of prednisone.  She is maintained on Ambrisentan and tadalafil.  She only takes torsemide on an as-needed basis since creatinine was noted to be slight high. For the past week she developed pain/soreness around the left ribs after exercising with weights.  Pain is generally worse at night and increases with deep breaths.  Pain was actually absent on 11/18 but she called Dr. Daine Gravel and he was concerned about a blood clot and hence worked in as an acute visit in our pulmonary office, from where a D-dimer was obtained , this was noted to be high and VQ scan could not be arranged as outpatient and she was referred to the emergency room.  VQ scan was noted to be indeterminate -small segmental area of decreased perfusion in the left upper lobe Venous duplex showed age-indeterminate clot in the right popliteal vein    Significant tests/ events reviewed  CT chest 12/2020 stable pulmonary nodules, background of emphysema and scarring, no definite ILD  HRCT chest  06/2018 biapical pleural-parenchymal scarring, emphysema, Negative for ILD.  Scattered postinfectious postinflammatory scarring in the lungs bilaterally residual benign-appearing nodular densities.  PFT 08/12/12: TLC 108% pred, FVC 105% pred, FEV1 114% pred, FEV1/FVC 73%, FEF25-75% 59% pred, DLCO 48% pred. October 2019 forced vital capacity 1.60 L 91% predicted, total lung capacity 6.88 L 175% predicted, residual volume to 43% predicted, DLCO 6.1 mL 42% predicted   9/ 2019 [facility] right atrial 8, PA pressure 72/32 mean 42 wedge pressure 14, cardiac index 3.4 L/min/m, PVR 6 [place] units  PAST MEDICAL HISTORY :  Past Medical History:  Diagnosis Date   Heart failure (HCC)    HTN (hypertension)    Hyperlipidemia    Hypothyroidism    Kidney disease    Stage IV- Dr. Servando Salina -LOV 02-11-14   Osteoarthritis    Osteoporosis    Pulmonary hypertension (HCC)    Raynaud disease    reactive with cold and stress mostly fingers.   Scleroderma ([place])    lungs and kidneys   Shortness of breath dyspnea    with exertion-in Cardiac rehab program at [facility].   Past Surgical History:  Procedure Laterality Date   BREAST BIOPSY Bilateral    x    CARDIAC CATHETERIZATION     2'15 [facility]   CHOLECYSTECTOMY     CHOLECYSTECTOMY, LAPAROSCOPIC     COLONOSCOPY WITH PROPOFOL N/A 05/07/2014   Procedure: COLONOSCOPY WITH PROPOFOL;  Surgeon: Inda Castle, MD;  Location: [facility] ENDOSCOPY;  Service: Endoscopy;  Laterality: N/A;   ESOPHAGOGASTRODUODENOSCOPY (EGD) WITH PROPOFOL N/A 05/07/2014   Procedure: ESOPHAGOGASTRODUODENOSCOPY (EGD) WITH PROPOFOL;  Surgeon: Inda Castle, MD;  Location: [facility] ENDOSCOPY;  Service: Endoscopy;  Laterality: N/A;   HOT HEMOSTASIS N/A 05/07/2014   Procedure: HOT HEMOSTASIS (ARGON PLASMA COAGULATION/BICAP);  Surgeon: Inda Castle, MD;  Location: [facility];  Service: Endoscopy;  Laterality: N/A;  deuodenal bulb   SHOULDER ARTHROSCOPY W/ ROTATOR CUFF REPAIR Right    Prior  to Admission medications   Medication Sig Start Date End Date Taking? Authorizing Provider  Acetaminophen 500 MG capsule Take 1,000 mg by mouth every 6 (six) hours as needed for fever.   Yes [provider]  ambrisentan (LETAIRIS) 10 MG tablet Take 10 mg by mouth daily.    Yes [provider]  amLODipine (NORVASC) 5 MG tablet Take 5 mg by mouth daily. 01/24/21 01/24/22 Yes [provider]  atorvastatin (LIPITOR) 20 MG tablet Take 20 mg by mouth daily.   Yes [provider]  chlorthalidone (HYGROTON) 25 MG tablet Take 25 mg by mouth daily.   Yes [provider]  Cyanocobalamin (VITAMIN B-12 ER PO) Take 1 tablet by mouth daily.   Yes [provider]  denosumab (PROLIA) 60 MG/ML SOSY injection Inject 60 mg into the skin every 6 (six) months.   Yes [provider]  ferrous sulfate 324 MG TBEC Take 324 mg by mouth daily.   Yes [provider]  folic acid (FOLVITE) 472 MCG tablet Take 400 mcg by mouth every evening.    Yes [provider]  levothyroxine (SYNTHROID, LEVOTHROID) 50 MCG tablet Take 50 mcg by mouth every morning.   Yes [provider]  loperamide (IMODIUM A-D) 2 MG tablet Take 4 mg by mouth 4 (four) times daily as needed for diarrhea or loose stools.    Yes [provider]  Melatonin 10 MG TABS Take 10 mg by mouth daily as needed (sleep).    Yes [provider]  metroNIDAZOLE (FLAGYL) 500 MG tablet Take 500 mg by mouth 3 (three) times daily. 02/11/21  Yes [provider]  omeprazole (PRILOSEC) 20 MG capsule Take 20 mg by mouth daily.   Yes [provider]  OXYGEN Inhale 2-4 L into the lungs continuous. 4 L with exertion 2 L at night   Yes [provider]  predniSONE (DELTASONE) 10 MG tablet Take 5 mg by mouth daily with breakfast.   Yes [provider]  tadalafil (ADCIRCA/CIALIS) 20 MG tablet Take 40 mg by mouth every evening.    Yes [provider]  torsemide (DEMADEX) 10 MG tablet Take 10 mg by mouth daily.   Yes [provider]  allopurinol (ZYLOPRIM) 100 MG tablet Take 100 mg by mouth daily. Patient not taking: Reported on 03/10/2021 11/13/16   [provider]  atovaquone (MEPRON) 750 MG/5ML suspension Take 10 mLs (1,500 mg total) by mouth daily with breakfast. Patient not taking: Reported on 03/10/2021 05/10/18   Juanito Doom, MD  fluticasone (FLONASE) 50 MCG/ACT nasal spray Place 2 sprays into both nostrils daily. Patient not taking: Reported on 03/10/2021 01/17/21   Rozetta Nunnery, MD  mycophenolate (CELLCEPT) 250 MG capsule Take 3 capsules (750 mg total) by mouth 2 (two) times daily. Patient not taking: Reported on 03/10/2021 05/10/18   Juanito Doom, MD  predniSONE (DELTASONE) 2.5 MG tablet Take 1 tablet (2.5 mg total) by mouth daily. Patient not taking: Reported on 03/10/2021 12/27/18  Parrett, Fonnie Mu, NP   Allergies  Allergen Reactions   Losartan Other (See Comments) and Cough    High calcium count and renal problems   Nsaids     Other reaction(s): Kidney Disorder   Ace Inhibitors Other (See Comments) and Cough    Dizziness and coughing    Sulfa Antibiotics Hives   Codeine Nausea Only   Heparin Anxiety and Other (See Comments)    Pt reports that they have a sensitivity towards heparin. Pt becomes depressed and anxious to the point of crying uncontrollably.     FAMILY HISTORY:  Family History  Problem Relation Age of Onset   Heart disease Mother    Heart disease Father    Cancer Other        husband--esophageal   Diabetes Son    Stroke Daughter    Breast cancer Neg Hx    SOCIAL HISTORY:  reports that she quit smoking about 33 years ago. Her smoking use included cigarettes. She has a 90.00 pack-year smoking history. She has never used smokeless tobacco. She reports current alcohol use. She reports that she does not use drugs.  REVIEW OF SYSTEMS:   Constitutional:  Negative for fever, chills, weight loss, malaise/fatigue and diaphoresis.  HENT: Negative for hearing loss, ear pain, nosebleeds, congestion, sore throat, neck pain, tinnitus and ear discharge.   Eyes: Negative for blurred vision, double vision, photophobia, pain, discharge and redness.  Respiratory: Negative for cough, hemoptysis, sputum production, wheezing and stridor.   Cardiovascular: Negative for  palpitations, orthopnea, claudication, and PND.  Gastrointestinal: Negative for heartburn, nausea, vomiting, abdominal pain, diarrhea, constipation, blood in stool and melena.  Genitourinary: Negative for dysuria, urgency, frequency, hematuria and flank pain.  Musculoskeletal: Negative for myalgias, back pain, joint pain and falls.  Skin: Negative for itching and rash.  Neurological: Negative for dizziness, tingling, tremors, sensory change, speech change, focal weakness, seizures, loss of consciousness, weakness and headaches.  Endo/Heme/Allergies: Negative for environmental allergies and polydipsia. Does not bruise/bleed easily.  SUBJECTIVE:   VITAL SIGNS: Temp:  [97.8 F (36.6 C)] 97.8 F (36.6 C) (11/18 1321) Pulse Rate:  [53-84] 53 (11/19 0845) Resp:  [16-33] 16 (11/19 0845) BP: (130-150)/(45-110) 131/45 (11/19 0845) SpO2:  [96 %-100 %] 99 % (11/19 0845) Weight:  [41.6 kg] 41.6 kg (11/18 1630)  PHYSICAL EXAMINATION: Gen. Pleasant, thin frail woman, lying in ED stretcher, in no distress, normal affect ENT - no lesions, no post nasal drip Neck: No JVD, no thyromegaly, no carotid bruits Lungs: no use of accessory muscles, no dullness to percussion, decreased breath sounds bilateral without rales or rhonchi , reproducible left axillary chest wall tenderness Cardiovascular: Rhythm regular, heart sounds  normal, no murmurs, no peripheral edema Abdomen: soft and non-tender, no hepatosplenomegaly, BS normal. Musculoskeletal: No deformities, no cyanosis or clubbing , tiny ulcer on right  index finger Neuro:  alert, non focal Skin:  Warm, no lesions/ rash   Recent Labs  Lab 03/10/21 1205 03/11/21 1424 03/12/21 0459  NA 144 143 145  K 3.5 3.9 3.5  CL 103 107 105  CO2 _0 BUN 57* 62* 62*  CREATININE 1.60* 1.44* 1.56*  GLUCOSE 151* 148* 76   Recent Labs  Lab 03/11/21 1424  HGB 10.2*  HCT 31.4*  WBC 10.2  PLT 321   DG Chest 2 View  Result Date: 03/10/2021 CLINICAL DATA:  Left-sided pleuritic pain EXAM: CHEST - 2 VIEW COMPARISON:  Radiograph 12/23/2020, chest CT 12/24/2020 FINDINGS: Unchanged cardiomediastinal silhouette.  There are diffuse interstitial opacities increased from prior exam. Scattered nodular opacities consistent with known pulmonary nodules better seen on prior CT. Small left pleural effusion. No pneumothorax. No acute osseous abnormality. IMPRESSION: Increased diffuse interstitial opacities, likely representing mild pulmonary edema. Small left pleural effusion. Electronically Signed   By: Maurine Simmering M.D.   On: 03/10/2021 12:09   NM Pulmonary Perfusion  Result Date: 03/11/2021 CLINICAL DATA:  Left-sided chest pain. EXAM: NUCLEAR MEDICINE PERFUSION LUNG SCAN TECHNIQUE: Perfusion images were obtained in multiple projections after intravenous injection of radiopharmaceutical. Ventilation scans intentionally deferred if perfusion scan and chest x-ray adequate for interpretation during COVID 19 epidemic. RADIOPHARMACEUTICALS:  4.4 mCi Tc-70mMAA IV COMPARISON:  September 27, 2013 FINDINGS: Predominant homogeneous distribution of tracer activity is seen throughout both lungs. A small, ill-defined nonsegmental area of mildly decreased perfusion is seen along the periphery of the upper left lung on the anterior view. No segmental or subsegmental perfusion defects are identified. IMPRESSION: Small nonsegmental area of decreased perfusion suspected within the left upper lobe without evidence to strongly suggest the presence of pulmonary embolism. Electronically  Signed   By: TVirgina NorfolkM.D.   On: 03/11/2021 15:57   VAS [place]OWER EXTREMITY VENOUS (DVT) (7a-7p)  Result Date: 03/12/2021  Lower Venous DVT Study Patient Name:  STASHAYA ANCRUM Date of Exam:   03/11/2021 Medical Rec #: [id number]         Accession #:    [id number] of Birth: 4Jan 02, 1937          Patient Gender: F Patient Age:   89years [facility]      V[facility] [place]OWER EXTREMITY VENOUS (DVT) Referring Phys: JWille GlaserKNAPP --------------------------------------------------------------------------------  Indications: Worsening shortness of breath - sent by DR.  Limitations: Calcific shadowing. Comparison Study: No previous exams Performing Technologist: Jody Hill RVT, RDMS  Examination Guidelines: A complete evaluation includes B-mode imaging, spectral Doppler, color Doppler, and power Doppler as needed of all accessible portions of each vessel. Bilateral testing is considered an integral part of a complete examination. Limited examinations for reoccurring indications may be performed as noted. The reflux portion of the exam is performed with the patient in reverse Trendelenburg.  +---------+---------------+---------+-----------+----------+-----------------+ RIGHT    CompressibilityPhasicitySpontaneityPropertiesThrombus Aging    +---------+---------------+---------+-----------+----------+-----------------+ CFV      Full           Yes      Yes                                    +---------+---------------+---------+-----------+----------+-----------------+ SFJ      Full                                                           +---------+---------------+---------+-----------+----------+-----------------+ FV Prox  Full           Yes      Yes                                    +---------+---------------+---------+-----------+----------+-----------------+ FV Mid   Full           Yes  Yes                                     +---------+---------------+---------+-----------+----------+-----------------+ FV DistalFull           Yes      Yes                                    +---------+---------------+---------+-----------+----------+-----------------+ PFV      Full                                                           +---------+---------------+---------+-----------+----------+-----------------+ POP      Partial        Yes      Yes                  Age Indeterminate +---------+---------------+---------+-----------+----------+-----------------+ PTV      Full                                                           +---------+---------------+---------+-----------+----------+-----------------+ PERO     Full                                                           +---------+---------------+---------+-----------+----------+-----------------+   +---------+---------------+---------+-----------+----------+--------------+ LEFT     CompressibilityPhasicitySpontaneityPropertiesThrombus Aging +---------+---------------+---------+-----------+----------+--------------+ CFV      Full           Yes      Yes                                 +---------+---------------+---------+-----------+----------+--------------+ SFJ      Full                                                        +---------+---------------+---------+-----------+----------+--------------+ FV Prox  Full           Yes      Yes                                 +---------+---------------+---------+-----------+----------+--------------+ FV Mid   Full           Yes      Yes                                 +---------+---------------+---------+-----------+----------+--------------+ FV DistalFull           Yes      Yes                                 +---------+---------------+---------+-----------+----------+--------------+  PFV      Full                                                         +---------+---------------+---------+-----------+----------+--------------+ POP      Full           Yes      Yes                                 +---------+---------------+---------+-----------+----------+--------------+ PTV      Full                                                        +---------+---------------+---------+-----------+----------+--------------+ PERO     Full                                                        +---------+---------------+---------+-----------+----------+--------------+     Summary: BILATERAL: - No evidence of superficial venous thrombosis in the lower extremities, bilaterally. -No evidence of popliteal cyst, bilaterally. RIGHT: - Findings consistent with age indeterminate deep vein thrombosis involving the right popliteal vein. Subcutaneous edema seen in area of calf  LEFT: - There is no evidence of deep vein thrombosis in the lower extremity.  Subcutaneous edema seen in area of the calf.  *See table(s) above for measurements and observations. Electronically signed by Jamelle Haring on 03/12/2021 at 11:07:31 AM.    Final     ASSESSMENT / PLAN:  Scleroderma/pulmonary hypertension WHO group 1, NYHA III symptoms -Her recent 10 pound weight gain with increasing bipedal edema is concerning for acute cor pulmonale likely related to discontinuing her diuretic Slight high BNP also confirms This might also explain her early morning dyspnea  -Would suggest that we restart her torsemide and aim for a weight loss of at least 4 to 5 pounds -She will need close monitoring of her renal function and since she is established with our office I encouraged her to keep her follow-up appointment with Dr. Vaughan Browner   Chest pain -appears to be musculoskeletal with reproducible chest wall tenderness, VQ scan was indeterminate and popliteal DVTs age-indeterminate. I am not convinced that she had an acute VTE episode Unfortunately CT angiogram cannot be obtained due to renal  function, hence we may have to commit her to short-term anticoagulation at least for 6 to 12 weeks  Chronic hypoxic respiratory failure-oxygenation does not seem to be any worse than baseline. She does not seem to have any evidence of ILD  PCCM will be available as needed  Kara Mead MD. FCCP. [place] [facility] Pager [phone] If no response call [phone]    03/12/2021, 11:57 AM

## 2021-03-12 NOTE — Progress Notes (Addendum)
PROGRESS NOTE    Meghan Wise  EZM:[id number] DOB: 1936/01/12 DOA: 03/11/2021 PCP: Carol Ada, MD   Chief Complain: Shortness of breath  Brief Narrative: Patient is a 85 year old female with history of pulmonary hypertension, scleroderma, hypertension, anemia, CKD stage 3b, hypothyroidism who presented to the emergency department with complaints of chest pain, shortness of breath that started about a week ago.  She was seen by her pulmonologist Dr. Lake Bells, and was planned to get a CTA of the chest to rule out PE but instructed to come to the emergency department.  She uses 4 L of oxygen at home.  VQ scan done in the emergency department was not strongly suggestive of PE but showed small nonsegmental area of decreased perfusion within the left upper lobe.  Lower extremity Doppler showed age indeterminant DVT of right popliteal vein.  Patient was started on  Lovenox for possible DVT/PE.  PCCM consulted.  Assessment & Plan:   Principal Problem:   DVT (deep venous thrombosis) (HCC) Active Problems:   Hypothyroidism   Chronic diastolic CHF (congestive heart failure), NYHA class 4 (HCC)   CKD (chronic kidney disease) stage 4, GFR 15-29 ml/min (HCC)   Anemia of chronic disease   ILD (interstitial lung disease) (HCC)   Pulmonary hypertension (HCC)   CHF (congestive heart failure) (HCC)   Worsening dyspnea/acute on chronic hypoxic respiratory failure/ Has history of pulmonary artery hypertension.  Uses 4 L of oxygen at baseline.  Presented with persistent shortness of breath despite being on supplemental oxygen.She follows with pulmonology, Dr. Lake Bells.  We will request pulmonology to follow her here.  Indeterminate right lower extremity DVT/suspected PE:VQ scan done in the emergency department was not strongly suggestive of PE but showed small nonsegmental area of decreased perfusion within the left upper lobe.  Lower extremity Doppler showed age indeterminant DVT of right popliteal vein.   Patient was started on  Lovenox for possible DVT/PE.  Diastolic congestive heart failure: She takes torsemide 10 mg daily at home. Will get 2D echo.  Continue input/output monitoring. Last 2D echo done on April this year showed EF of 55% with grade 2 diastolic dysfunction.  Elevated BNP.  We will continue Lasix 40 mg IV twice daily for now.  History of pulmonary artery hypertension with scleroderma: On tadalafil, Ambrisentan, steroid.  On 4 L of oxygen at home  Hypertension: Monitor blood pressure.  Continue amlodipine, chlorthalidone, torsemide  History of gout: On allopurinol.  Chronic normocytic anemia: Continue iron, vitamin B12, folate  History of hyperlipidemia: On Lipitor at home.  CKD stage 3b: Currently kidney function at baseline.  Continue monitoring.  Currently kidney function at baseline.  Her baseline creatinine is around 1.5.  Hypothyroidism: Continue Synthyroid  Debility/deconditioning: We will request for PT/OT evaluation.  Lives alone           DVT prophylaxis:Lovenox Code Status: Full Family Communication: None at bedside Status is: Observation    Consultants: PCCM  Procedures:None  Antimicrobials:  Anti-infectives (From admission, onward)    None       Subjective: Patient seen and examined at the bedside this morning.  Hemodynamically stable.  On 4 L of  oxygen per minute.  She feels better today.  She denies worsening shortness of breath or cough.  Objective: Vitals:   03/11/21 1845 03/11/21 1900 03/12/21 0230 03/12/21 0544  BP: (!) 145/110 (!) 150/63 140/64 (!) 130/58  Pulse:  84 78 73  Resp: (!) 33 (!) 23 (!) 22 18  Temp:  TempSrc:      SpO2:  100% 96% 100%  Weight:      Height:       No intake or output data in the 24 hours ending 03/12/21 0806 Filed Weights   03/11/21 1320 03/11/21 1630  Weight: 41.6 kg 41.6 kg    Examination:  General exam: Overall comfortable, not in distress, pleasant elderly female,  deconditioned, chronically ill looking HEENT: PERRL Respiratory system:  no wheezes or crackles  Cardiovascular system: S1 & S2 heard, RRR.  Gastrointestinal system: Abdomen is nondistended, soft and nontender. Central nervous system: Alert and oriented Extremities: No edema, no clubbing ,no cyanosis Skin: No rashes, no ulcers,no icterus      Data Reviewed: I have personally reviewed following labs and imaging studies  CBC: Recent Labs  Lab 03/11/21 1424  WBC 10.2  NEUTROABS 9.3*  HGB 10.2*  HCT 31.4*  MCV 101.0*  PLT 680   Basic Metabolic Panel: Recent Labs  Lab 03/10/21 1205 03/11/21 1424 03/12/21 0459  NA 144 143 145  K 3.5 3.9 3.5  CL 103 107 105  CO2 _0 GLUCOSE 151* 148* 76  BUN 57* 62* 62*  CREATININE 1.60* 1.44* 1.56*  CALCIUM 8.8 9.1 8.6*  MG  --   --  2.2   GFR: Estimated Creatinine Clearance: 17.6 mL/min (A) (by C-G formula based on SCr of 1.56 mg/dL (H)). Liver Function Tests: Recent Labs  Lab 03/11/21 1424 03/12/21 0459  AST 32 25  ALT 30 23  ALKPHOS 124 94  BILITOT 0.6 0.7  PROT 7.8 6.7  ALBUMIN 4.2 3.6   No results for input(s): LIPASE, AMYLASE in the last 168 hours. No results for input(s): AMMONIA in the last 168 hours. Coagulation Profile: No results for input(s): INR, PROTIME in the last 168 hours. Cardiac Enzymes: No results for input(s): CKTOTAL, CKMB, CKMBINDEX, TROPONINI in the last 168 hours. BNP (last 3 results) No results for input(s): PROBNP in the last 8760 hours. HbA1C: No results for input(s): HGBA1C in the last 72 hours. CBG: No results for input(s): GLUCAP in the last 168 hours. Lipid Profile: No results for input(s): CHOL, HDL, LDLCALC, TRIG, CHOLHDL, LDLDIRECT in the last 72 hours. Thyroid Function Tests: Recent Labs    03/12/21 0459  TSH 3.442   Anemia Panel: No results for input(s): VITAMINB12, FOLATE, FERRITIN, TIBC, IRON, RETICCTPCT in the last 72 hours. Sepsis Labs: No results for input(s):  PROCALCITON, LATICACIDVEN in the last 168 hours.  Recent Results (from the past 240 hour(s))  Resp Panel by RT-PCR (Flu A&B, Covid) Nasopharyngeal Swab     Status: None   Collection Time: 03/11/21  2:11 PM   Specimen: Nasopharyngeal Swab; Nasopharyngeal(NP) swabs in vial transport medium  Result Value Ref Range Status   SARS Coronavirus 2 by RT PCR NEGATIVE NEGATIVE Final    Comment: (NOTE) SARS-CoV-2 target nucleic acids are NOT DETECTED.  The SARS-CoV-2 RNA is generally detectable in upper respiratory specimens during the acute phase of infection. The lowest concentration of SARS-CoV-2 viral copies this assay can detect is 138 copies/mL. A negative result does not preclude SARS-Cov-2 infection and should not be used as the sole basis for treatment or other patient management decisions. A negative result may occur with  improper specimen collection/handling, submission of specimen other than nasopharyngeal swab, presence of viral mutation(s) within the areas targeted by this assay, and inadequate number of viral copies(<138 copies/mL). A negative result must be combined with clinical observations, patient history, and  epidemiological information. The expected result is Negative.  Fact Sheet for Patients:  EntrepreneurPulse.com.au  Fact Sheet for Healthcare Providers:  IncredibleEmployment.be  This test is no t yet approved or cleared by the [place] FDA and  has been authorized for detection and/or diagnosis of SARS-CoV-2 by FDA under an Emergency Use Authorization (EUA). This EUA will remain  in effect (meaning this test can be used) for the duration of the COVID-19 declaration under Section 564(b)(1) of the Act, 21 U.S.C.section 360bbb-3(b)(1), unless the authorization is terminated  or revoked sooner.       Influenza A by PCR NEGATIVE NEGATIVE Final   Influenza B by PCR NEGATIVE NEGATIVE Final    Comment: (NOTE) The Xpert Xpress  SARS-CoV-2/FLU/RSV plus assay is intended as an aid in the diagnosis of influenza from Nasopharyngeal swab specimens and should not be used as a sole basis for treatment. Nasal washings and aspirates are unacceptable for Xpert Xpress SARS-CoV-2/FLU/RSV testing.  Fact Sheet for Patients: EntrepreneurPulse.com.au  Fact Sheet for Healthcare Providers: IncredibleEmployment.be  This test is not yet approved or cleared by the [place] FDA and has been authorized for detection and/or diagnosis of SARS-CoV-2 by FDA under an Emergency Use Authorization (EUA). This EUA will remain in effect (meaning this test can be used) for the duration of the COVID-19 declaration under Section 564(b)(1) of the Act, 21 U.S.C. section 360bbb-3(b)(1), unless the authorization is terminated or revoked.  Performed at [facility], [place] [address] [place], [place] [address]          Radiology Studies: DG Chest 2 View  Result Date: 03/10/2021 CLINICAL DATA:  Left-sided pleuritic pain EXAM: CHEST - 2 VIEW COMPARISON:  Radiograph 12/23/2020, chest CT 12/24/2020 FINDINGS: Unchanged cardiomediastinal silhouette. There are diffuse interstitial opacities increased from prior exam. Scattered nodular opacities consistent with known pulmonary nodules better seen on prior CT. Small left pleural effusion. No pneumothorax. No acute osseous abnormality. IMPRESSION: Increased diffuse interstitial opacities, likely representing mild pulmonary edema. Small left pleural effusion. Electronically Signed   By: Maurine Simmering M.D.   On: 03/10/2021 12:09   NM Pulmonary Perfusion  Result Date: 03/11/2021 CLINICAL DATA:  Left-sided chest pain. EXAM: NUCLEAR MEDICINE PERFUSION LUNG SCAN TECHNIQUE: Perfusion images were obtained in multiple projections after intravenous injection of radiopharmaceutical. Ventilation scans intentionally deferred if perfusion scan and chest x-ray  adequate for interpretation during COVID 19 epidemic. RADIOPHARMACEUTICALS:  4.4 mCi Tc-82mMAA IV COMPARISON:  September 27, 2013 FINDINGS: Predominant homogeneous distribution of tracer activity is seen throughout both lungs. A small, ill-defined nonsegmental area of mildly decreased perfusion is seen along the periphery of the upper left lung on the anterior view. No segmental or subsegmental perfusion defects are identified. IMPRESSION: Small nonsegmental area of decreased perfusion suspected within the left upper lobe without evidence to strongly suggest the presence of pulmonary embolism. Electronically Signed   By: TVirgina NorfolkM.D.   On: 03/11/2021 15:57   VAS [place]OWER EXTREMITY VENOUS (DVT) (7a-7p)  Result Date: 03/11/2021  Lower Venous DVT Study Patient Name:  Meghan Wise Date of Exam:   03/11/2021 Medical Rec #: [id number]         Accession #:    [id number] of Birth: [birth year]          Patient Gender: F Patient Age:   83years [facility]      VAS [place]OWER EXTREMITY VENOUS (DVT) Referring Phys: JON KNAPP --------------------------------------------------------------------------------  Indications: Worsening shortness of breath - sent by DR.  Limitations: Calcific shadowing. Comparison Study: No previous exams Performing Technologist: Jody Hill RVT, RDMS  Examination Guidelines: A complete evaluation includes B-mode imaging, spectral Doppler, color Doppler, and power Doppler as needed of all accessible portions of each vessel. Bilateral testing is considered an integral part of a complete examination. Limited examinations for reoccurring indications may be performed as noted. The reflux portion of the exam is performed with the patient in reverse Trendelenburg.  +---------+---------------+---------+-----------+----------+-----------------+ RIGHT    CompressibilityPhasicitySpontaneityPropertiesThrombus Aging     +---------+---------------+---------+-----------+----------+-----------------+ CFV      Full           Yes      Yes                                    +---------+---------------+---------+-----------+----------+-----------------+ SFJ      Full                                                           +---------+---------------+---------+-----------+----------+-----------------+ FV Prox  Full           Yes      Yes                                    +---------+---------------+---------+-----------+----------+-----------------+ FV Mid   Full           Yes      Yes                                    +---------+---------------+---------+-----------+----------+-----------------+ FV DistalFull           Yes      Yes                                    +---------+---------------+---------+-----------+----------+-----------------+ PFV      Full                                                           +---------+---------------+---------+-----------+----------+-----------------+ POP      Partial        Yes      Yes                  Age Indeterminate +---------+---------------+---------+-----------+----------+-----------------+ PTV      Full                                                           +---------+---------------+---------+-----------+----------+-----------------+ PERO     Full                                                           +---------+---------------+---------+-----------+----------+-----------------+   +---------+---------------+---------+-----------+----------+--------------+  LEFT     CompressibilityPhasicitySpontaneityPropertiesThrombus Aging +---------+---------------+---------+-----------+----------+--------------+ CFV      Full           Yes      Yes                                 +---------+---------------+---------+-----------+----------+--------------+ SFJ      Full                                                         +---------+---------------+---------+-----------+----------+--------------+ FV Prox  Full           Yes      Yes                                 +---------+---------------+---------+-----------+----------+--------------+ FV Mid   Full           Yes      Yes                                 +---------+---------------+---------+-----------+----------+--------------+ FV DistalFull           Yes      Yes                                 +---------+---------------+---------+-----------+----------+--------------+ PFV      Full                                                        +---------+---------------+---------+-----------+----------+--------------+ POP      Full           Yes      Yes                                 +---------+---------------+---------+-----------+----------+--------------+ PTV      Full                                                        +---------+---------------+---------+-----------+----------+--------------+ PERO     Full                                                        +---------+---------------+---------+-----------+----------+--------------+    Summary: BILATERAL: - No evidence of superficial venous thrombosis in the lower extremities, bilaterally. -No evidence of popliteal cyst, bilaterally. RIGHT: - Findings consistent with age indeterminate deep vein thrombosis involving the right popliteal vein. Subcutaneous edema seen in area of calf  LEFT: - There is no evidence of deep vein thrombosis in the lower extremity.  Subcutaneous edema seen in  area of the calf.  *See table(s) above for measurements and observations.    Preliminary         Scheduled Meds:  ambrisentan  10 mg Oral Daily   amLODipine  5 mg Oral Daily   atorvastatin  20 mg Oral QHS   chlorthalidone  25 mg Oral QHS   enoxaparin (LOVENOX) injection  40 mg Subcutaneous Q24H   ferrous sulfate  324 mg Oral QHS   folic acid  870 mcg Oral QPM   levothyroxine   50 mcg Oral Q0600   pantoprazole  40 mg Oral Daily   predniSONE  5 mg Oral Q breakfast   tadalafil  40 mg Oral QPM   torsemide  10 mg Oral Daily   vitamin B-12  1,000 mcg Oral QHS   Continuous Infusions:   LOS: 0 days    Time spent: 35 mins.More than 50% of that time was spent in counseling and/or coordination of care.      Shelly Coss, MD [facility] P11/19/2022, 8:06 AM

## 2021-03-12 NOTE — Progress Notes (Signed)
  Echocardiogram 2D Echocardiogram has been performed.  Merrie Roof F 03/12/2021, 10:35 AM

## 2021-03-13 DIAGNOSIS — J9621 Acute and chronic respiratory failure with hypoxia: Secondary | ICD-10-CM | POA: Diagnosis present

## 2021-03-13 DIAGNOSIS — M349 Systemic sclerosis, unspecified: Secondary | ICD-10-CM | POA: Diagnosis present

## 2021-03-13 DIAGNOSIS — M810 Age-related osteoporosis without current pathological fracture: Secondary | ICD-10-CM | POA: Diagnosis present

## 2021-03-13 DIAGNOSIS — I959 Hypotension, unspecified: Secondary | ICD-10-CM | POA: Diagnosis present

## 2021-03-13 DIAGNOSIS — J439 Emphysema, unspecified: Secondary | ICD-10-CM | POA: Diagnosis present

## 2021-03-13 DIAGNOSIS — Z87891 Personal history of nicotine dependence: Secondary | ICD-10-CM | POA: Diagnosis not present

## 2021-03-13 DIAGNOSIS — R918 Other nonspecific abnormal finding of lung field: Secondary | ICD-10-CM | POA: Diagnosis present

## 2021-03-13 DIAGNOSIS — M1990 Unspecified osteoarthritis, unspecified site: Secondary | ICD-10-CM | POA: Diagnosis present

## 2021-03-13 DIAGNOSIS — Z20822 Contact with and (suspected) exposure to covid-19: Secondary | ICD-10-CM | POA: Diagnosis present

## 2021-03-13 DIAGNOSIS — M109 Gout, unspecified: Secondary | ICD-10-CM | POA: Diagnosis present

## 2021-03-13 DIAGNOSIS — I2699 Other pulmonary embolism without acute cor pulmonale: Secondary | ICD-10-CM | POA: Diagnosis present

## 2021-03-13 DIAGNOSIS — I82431 Acute embolism and thrombosis of right popliteal vein: Secondary | ICD-10-CM | POA: Diagnosis present

## 2021-03-13 DIAGNOSIS — R0789 Other chest pain: Secondary | ICD-10-CM | POA: Diagnosis present

## 2021-03-13 DIAGNOSIS — I7300 Raynaud's syndrome without gangrene: Secondary | ICD-10-CM | POA: Diagnosis present

## 2021-03-13 DIAGNOSIS — R0602 Shortness of breath: Secondary | ICD-10-CM | POA: Diagnosis present

## 2021-03-13 DIAGNOSIS — E039 Hypothyroidism, unspecified: Secondary | ICD-10-CM | POA: Diagnosis present

## 2021-03-13 DIAGNOSIS — I2721 Secondary pulmonary arterial hypertension: Secondary | ICD-10-CM | POA: Diagnosis present

## 2021-03-13 DIAGNOSIS — I5033 Acute on chronic diastolic (congestive) heart failure: Secondary | ICD-10-CM | POA: Diagnosis present

## 2021-03-13 DIAGNOSIS — Z8249 Family history of ischemic heart disease and other diseases of the circulatory system: Secondary | ICD-10-CM | POA: Diagnosis not present

## 2021-03-13 DIAGNOSIS — N179 Acute kidney failure, unspecified: Secondary | ICD-10-CM | POA: Diagnosis present

## 2021-03-13 DIAGNOSIS — E785 Hyperlipidemia, unspecified: Secondary | ICD-10-CM | POA: Diagnosis present

## 2021-03-13 DIAGNOSIS — D631 Anemia in chronic kidney disease: Secondary | ICD-10-CM | POA: Diagnosis present

## 2021-03-13 DIAGNOSIS — I82451 Acute embolism and thrombosis of right peroneal vein: Secondary | ICD-10-CM | POA: Diagnosis not present

## 2021-03-13 DIAGNOSIS — I130 Hypertensive heart and chronic kidney disease with heart failure and stage 1 through stage 4 chronic kidney disease, or unspecified chronic kidney disease: Secondary | ICD-10-CM | POA: Diagnosis present

## 2021-03-13 DIAGNOSIS — N184 Chronic kidney disease, stage 4 (severe): Secondary | ICD-10-CM | POA: Diagnosis present

## 2021-03-13 DIAGNOSIS — Z79899 Other long term (current) drug therapy: Secondary | ICD-10-CM | POA: Diagnosis not present

## 2021-03-13 LAB — CBC WITH DIFFERENTIAL/PLATELET
Abs Immature Granulocytes: 0.02 10*3/uL (ref 0.00–0.07)
Basophils Absolute: 0 10*3/uL (ref 0.0–0.1)
Basophils Relative: 1 %
Eosinophils Absolute: 0.4 10*3/uL (ref 0.0–0.5)
Eosinophils Relative: 5 %
HCT: 27.8 % — ABNORMAL LOW (ref 36.0–46.0)
Hemoglobin: 9.1 g/dL — ABNORMAL LOW (ref 12.0–15.0)
Immature Granulocytes: 0 %
Lymphocytes Relative: 21 %
Lymphs Abs: 1.6 10*3/uL (ref 0.7–4.0)
MCH: 32.6 pg (ref 26.0–34.0)
MCHC: 32.7 g/dL (ref 30.0–36.0)
MCV: 99.6 fL (ref 80.0–100.0)
Monocytes Absolute: 0.9 10*3/uL (ref 0.1–1.0)
Monocytes Relative: 11 %
Neutro Abs: 4.8 10*3/uL (ref 1.7–7.7)
Neutrophils Relative %: 62 %
Platelets: 296 10*3/uL (ref 150–400)
RBC: 2.79 MIL/uL — ABNORMAL LOW (ref 3.87–5.11)
RDW: 14.6 % (ref 11.5–15.5)
WBC: 7.7 10*3/uL (ref 4.0–10.5)
nRBC: 0 % (ref 0.0–0.2)

## 2021-03-13 LAB — BASIC METABOLIC PANEL
Anion gap: 12 (ref 5–15)
BUN: 65 mg/dL — ABNORMAL HIGH (ref 8–23)
CO2: 30 mmol/L (ref 22–32)
Calcium: 8.7 mg/dL — ABNORMAL LOW (ref 8.9–10.3)
Chloride: 101 mmol/L (ref 98–111)
Creatinine, Ser: 1.95 mg/dL — ABNORMAL HIGH (ref 0.44–1.00)
GFR, Estimated: 25 mL/min — ABNORMAL LOW (ref >60)
Glucose, Bld: 85 mg/dL (ref 70–99)
Potassium: 3.5 mmol/L (ref 3.5–5.1)
Sodium: 143 mmol/L (ref 135–145)

## 2021-03-13 NOTE — Plan of Care (Signed)

## 2021-03-13 NOTE — Evaluation (Signed)
Physical Therapy Evaluation Patient Details Name: Meghan Wise MRN: [id number] DOB: 1935-10-17 Today's Date: 03/13/2021  History of Present Illness  85 year old female with history of pulmonary hypertension, scleroderma, hypertension, anemia, CKD stage 3b, hypothyroidism who presented to the emergency department with complaints of chest pain, shortness of breath that started about a week ago.  She was seen by her pulmonologist Dr. Lake Bells, and was planned to get a CTA of the chest to rule out PE but instructed to come to the emergency department.  She uses 4 L of oxygen at home.  VQ scan done in the emergency department was not strongly suggestive of PE but showed small nonsegmental area of decreased perfusion within the left upper lobe.  Lower extremity Doppler showed age indeterminant DVT of right popliteal vein.  Patient was started on  Lovenox for possible DVT/PE.  PCCM consulted.  Clinical Impression  Pt admitted with above diagnosis.  Pt amb ~ 120' with RW and min/guard assist reporting 2-3/4 DOE on 4L O2. Unable to get O2 sat reading on multiple monitors d/t low perfusion. Pt reports she has a forehead monitor at home.  Pt requiring support of RW today d/t decr endurance/balance. She is an independent ambulator at baseline. Will continue to follow in acute setting,do not feel she will need f/u. Pt requesting to amb to bathroom vs using BSC, agree with this plan to build up her activity tolerance prior to d/c.   Pt currently with functional limitations due to the deficits listed below (see PT Problem List). Pt will benefit from skilled PT to increase their independence and safety with mobility to allow discharge to the venue listed below.          Recommendations for follow up therapy are one component of a multi-disciplinary discharge planning process, led by the attending physician.  Recommendations may be updated based on patient status, additional functional criteria and insurance  authorization.  Follow Up Recommendations No PT follow up    Assistance Recommended at Discharge PRN  Functional Status Assessment Patient has had a recent decline in their functional status and demonstrates the ability to make significant improvements in function in a reasonable and predictable amount of time.  Equipment Recommendations  None recommended by PT    Recommendations for Other Services       Precautions / Restrictions Precautions Precautions: None Restrictions Weight Bearing Restrictions: No      Mobility  Bed Mobility Overal bed mobility: Needs Assistance Bed Mobility: Supine to Sit     Supine to sit: Supervision     General bed mobility comments: for safety, lines, no physical assist    Transfers Overall transfer level: Needs assistance Equipment used: Rolling walker (2 wheels) Transfers: Sit to/from Stand;Bed to chair/wheelchair/BSC Sit to Stand: Min guard Stand pivot transfers: Min guard         General transfer comment: min-guard for safety and O2 line management    Ambulation/Gait Ambulation/Gait assistance: Min guard Gait Distance (Feet): 120 Feet Assistive device: Rolling walker (2 wheels) Gait Pattern/deviations: Step-through pattern;Decreased stride length       General Gait Details: cues for self monitoring dyspnea, posture and breathing. min/guard for safety, pt without overt LOB. on 4L O2 throughout, 3 O2 monitors attempted, unabel to get readign d/t poor perfusion  Stairs            Wheelchair Mobility    Modified Rankin (Stroke Patients Only)       Balance Overall balance assessment: Needs assistance Sitting-balance support:  Feet supported;No upper extremity supported Sitting balance-Leahy Scale: Good     Standing balance support: During functional activity;Reliant on assistive device for balance Standing balance-Leahy Scale: Fair Standing balance comment: reliant on UEs for dynamic activities                              Pertinent Vitals/Pain Pain Assessment: No/denies pain    Home Living Family/patient expects to be discharged to:: Private residence Living Arrangements: Alone   Type of Home: Apartment (senoior living at [facility])           Home Equipment: Conservation officer, nature (2 wheels)      Prior Function Prior Level of Function : Independent/Modified Independent             Mobility Comments: independent amb at baseline       Hand Dominance        Extremity/Trunk Assessment   Upper Extremity Assessment Upper Extremity Assessment: Overall WFL for tasks assessed    Lower Extremity Assessment Lower Extremity Assessment: Overall WFL for tasks assessed (strength at least 3 to 3+/5, diffuse atrophy throughout)       Communication   Communication: No difficulties  Cognition Arousal/Alertness: Awake/alert Behavior During Therapy: WFL for tasks assessed/performed Overall Cognitive Status: Within Functional Limits for tasks assessed                                          General Comments      Exercises     Assessment/Plan    PT Assessment Patient needs continued PT services  PT Problem List Decreased mobility;Cardiopulmonary status limiting activity;Decreased activity tolerance;Decreased knowledge of use of DME       PT Treatment Interventions DME instruction;Therapeutic activities;Gait training;Functional mobility training;Therapeutic exercise;Patient/family education    PT Goals (Current goals can be found in the Care Plan section)  Acute Rehab PT Goals Patient Stated Goal: home soon PT Goal Formulation: With patient Time For Goal Achievement: 03/27/21 Potential to Achieve Goals: Good    Frequency Min 3X/week   Barriers to discharge        Co-evaluation               AM-PAC PT "6 Clicks" Mobility  Outcome Measure Help needed turning from your back to your side while in a flat bed without using bedrails?:  None Help needed moving from lying on your back to sitting on the side of a flat bed without using bedrails?: None Help needed moving to and from a bed to a chair (including a wheelchair)?: A Little Help needed standing up from a chair using your arms (e.g., wheelchair or bedside chair)?: A Little Help needed to walk in hospital room?: A Little Help needed climbing 3-5 steps with a railing? : A Little 6 Click Score: 20    End of Session Equipment Utilized During Treatment: Gait belt Activity Tolerance: Patient tolerated treatment well Patient left: with call bell/phone within reach;in chair   PT Visit Diagnosis: Unsteadiness on feet (R26.81)    Time: 9373-4287 PT Time Calculation (min) (ACUTE ONLY): 21 min   Charges:   PT Evaluation $PT Eval Low Complexity: [place], PT  Acute Rehab Dept ([place]) [phone] Pager [phone]  03/13/2021   [facility] 03/13/2021, 2:23 PM

## 2021-03-13 NOTE — Progress Notes (Signed)
PROGRESS NOTE    Meghan Wise  IWP:[id number] DOB: [birth year] DOA: 03/11/2021 PCP: Carol Ada, MD   Chief Complain: Shortness of breath  Brief Narrative: Patient is a 84 year old female with history of pulmonary hypertension, scleroderma, hypertension, anemia, CKD stage 3b, hypothyroidism who presented to the emergency department with complaints of chest pain, shortness of breath that started about a week ago.  She was seen by her pulmonologist Dr. Lake Bells, and was planned to get a CTA of the chest to rule out PE but instructed to come to the emergency department.  She uses 4 L of oxygen at home.  VQ scan done in the emergency department was not strongly suggestive of PE but showed small nonsegmental area of decreased perfusion within the left upper lobe.  Lower extremity Doppler showed age indeterminant DVT of right popliteal vein.  Patient was started on  Lovenox for possible DVT/PE.  PCCM consulted.  Assessment & Plan:   Principal Problem:   DVT (deep venous thrombosis) (HCC) Active Problems:   Hypothyroidism   Chronic diastolic CHF (congestive heart failure), NYHA class 4 (HCC)   CKD (chronic kidney disease) stage 4, GFR 15-29 ml/min (HCC)   Anemia of chronic disease   ILD (interstitial lung disease) (HCC)   Pulmonary hypertension (HCC)   CHF (congestive heart failure) (HCC)   Worsening dyspnea/acute on chronic hypoxic respiratory failure/ Has history of pulmonary artery hypertension.  Uses 4 L of oxygen at baseline.  Presented with persistent shortness of breath despite being on supplemental oxygen.She follows with pulmonology, Dr. Lake Bells.  PCCM was consulted. Currently on baseline oxygen requirement.  Denies any dyspnea today.  Indeterminate right lower extremity DVT/suspected PE:VQ scan done in the emergency department was not strongly suggestive of PE but showed small nonsegmental area of decreased perfusion within the left upper lobe.  Lower extremity Doppler showed age  indeterminant DVT of right popliteal vein.  Patient was started on  Lovenox for possible DVT/PE.  We are trying to transition the anticoagulant to Eliquis.  Diastolic congestive heart failure: She takes torsemide 10 mg daily at home. Will get 2D echo.  Continue input/output monitoring. Last 2D echo done on April this year showed EF of 55% with grade 2 diastolic dysfunction.  Elevated BNP.  We started on  Lasix 40 mg IV twice daily for now but now on hold due to worsening kidney function. Echo done here showed EF of 60 to 50%, grade 1 diastolic dysfunction, elevated pulmonary artery systolic pressure  History of pulmonary artery hypertension with scleroderma: On tadalafil, Ambrisentan, steroid.  On 4 L of oxygen at home  Hypertension: Monitor blood pressure.  She was on amlodipine, chlorthalidone, torsemide  History of gout: On allopurinol.  Chronic normocytic anemia: Continue iron, vitamin B12, folate  History of hyperlipidemia: On Lipitor at home.  CKD stage 3b: Creatinine slightly worsened today to 1.9, diuresis discontinued.  She was also transiently hypotensive 111/90/22, antihypertensives on hold.  Her baseline creatinine is around 1.5.  Continue monitoring.  I will discuss with the nephrologist Dr. Marval Regal tomorrow   Hypothyroidism: Continue Synthyroid  Debility/deconditioning: We have  requested for PT/OT evaluation.  Lives alone           DVT prophylaxis:Lovenox Code Status: Full Family Communication: Called daughter on phone today for update, call not received Status is: Observation    Consultants: PCCM  Procedures:None  Antimicrobials:  Anti-infectives (From admission, onward)    None       Subjective: Patient seen and examined the  bedside this morning.  Hemodynamically stable.  Comfortable today.  On 4 L of oxygen, at baseline.  See mention her readiness to go home.  We discussed about importance of checking her kidney function  tomorrow  Objective: Vitals:   03/12/21 2123 03/13/21 0053 03/13/21 0500 03/13/21 0522  BP: (!) 138/54 130/72  115/65  Pulse: 69 64  65  Resp: _0 Temp: 98.4 F (36.9 C) 98.8 F (37.1 C)  98.6 F (37 C)  TempSrc: Oral Oral  Oral  SpO2: 97% 100%  100%  Weight:   35.7 kg   Height:        Intake/Output Summary (Last 24 hours) at 03/13/2021 0805 Last data filed at 03/12/2021 1826 Gross per 24 hour  Intake 120 ml  Output 750 ml  Net -630 ml   Filed Weights   03/11/21 1630 03/12/21 1626 03/13/21 0500  Weight: 41.6 kg 36.8 kg 35.7 kg    Examination:   General exam: Overall comfortable, not in distress, pleasant elderly female, chronically ill looking, deconditioned HEENT: PERRL Respiratory system: Diminished air entry bilaterally, no wheezes or crackles  Cardiovascular system: S1 & S2 heard, RRR.  Gastrointestinal system: Abdomen is nondistended, soft and nontender. Central nervous system: Alert and oriented Extremities: No edema, no clubbing ,no cyanosis Skin: No rashes, no ulcers,no icterus     Data Reviewed: I have personally reviewed following labs and imaging studies  CBC: Recent Labs  Lab 03/11/21 1424 03/13/21 0341  WBC 10.2 7.7  NEUTROABS 9.3* 4.8  HGB 10.2* 9.1*  HCT 31.4* 27.8*  MCV 101.0* 99.6  PLT 321 182   Basic Metabolic Panel: Recent Labs  Lab 03/10/21 1205 03/11/21 1424 03/12/21 0459 03/13/21 0341  NA 144 143 145 143  K 3.5 3.9 3.5 3.5  CL 103 107 105 101  CO2 _1 GLUCOSE 151* 148* 76 85  BUN 57* 62* 62* 65*  CREATININE 1.60* 1.44* 1.56* 1.95*  CALCIUM 8.8 9.1 8.6* 8.7*  MG  --   --  2.2  --    GFR: Estimated Creatinine Clearance: 12.3 mL/min (A) (by C-G formula based on SCr of 1.95 mg/dL (H)). Liver Function Tests: Recent Labs  Lab 03/11/21 1424 03/12/21 0459  AST 32 25  ALT 30 23  ALKPHOS 124 94  BILITOT 0.6 0.7  PROT 7.8 6.7  ALBUMIN 4.2 3.6   No results for input(s): LIPASE, AMYLASE in the last 168  hours. No results for input(s): AMMONIA in the last 168 hours. Coagulation Profile: No results for input(s): INR, PROTIME in the last 168 hours. Cardiac Enzymes: No results for input(s): CKTOTAL, CKMB, CKMBINDEX, TROPONINI in the last 168 hours. BNP (last 3 results) No results for input(s): PROBNP in the last 8760 hours. HbA1C: No results for input(s): HGBA1C in the last 72 hours. CBG: No results for input(s): GLUCAP in the last 168 hours. Lipid Profile: No results for input(s): CHOL, HDL, LDLCALC, TRIG, CHOLHDL, LDLDIRECT in the last 72 hours. Thyroid Function Tests: Recent Labs    03/12/21 0459  TSH 3.442   Anemia Panel: No results for input(s): VITAMINB12, FOLATE, FERRITIN, TIBC, IRON, RETICCTPCT in the last 72 hours. Sepsis Labs: No results for input(s): PROCALCITON, LATICACIDVEN in the last 168 hours.  Recent Results (from the past 240 hour(s))  Resp Panel by RT-PCR (Flu A&B, Covid) Nasopharyngeal Swab     Status: None   Collection Time: 03/11/21  2:11 PM   Specimen: Nasopharyngeal Swab; Nasopharyngeal(NP) swabs in  vial transport medium  Result Value Ref Range Status   SARS Coronavirus 2 by RT PCR NEGATIVE NEGATIVE Final    Comment: (NOTE) SARS-CoV-2 target nucleic acids are NOT DETECTED.  The SARS-CoV-2 RNA is generally detectable in upper respiratory specimens during the acute phase of infection. The lowest concentration of SARS-CoV-2 viral copies this assay can detect is 138 copies/mL. A negative result does not preclude SARS-Cov-2 infection and should not be used as the sole basis for treatment or other patient management decisions. A negative result may occur with  improper specimen collection/handling, submission of specimen other than nasopharyngeal swab, presence of viral mutation(s) within the areas targeted by this assay, and inadequate number of viral copies(<138 copies/mL). A negative result must be combined with clinical observations, patient history, and  epidemiological information. The expected result is Negative.  Fact Sheet for Patients:  EntrepreneurPulse.com.au  Fact Sheet for Healthcare Providers:  IncredibleEmployment.be  This test is no t yet approved or cleared by the [place] FDA and  has been authorized for detection and/or diagnosis of SARS-CoV-2 by FDA under an Emergency Use Authorization (EUA). This EUA will remain  in effect (meaning this test can be used) for the duration of the COVID-19 declaration under Section 564(b)(1) of the Act, 21 U.S.C.section 360bbb-3(b)(1), unless the authorization is terminated  or revoked sooner.       Influenza A by PCR NEGATIVE NEGATIVE Final   Influenza B by PCR NEGATIVE NEGATIVE Final    Comment: (NOTE) The Xpert Xpress SARS-CoV-2/FLU/RSV plus assay is intended as an aid in the diagnosis of influenza from Nasopharyngeal swab specimens and should not be used as a sole basis for treatment. Nasal washings and aspirates are unacceptable for Xpert Xpress SARS-CoV-2/FLU/RSV testing.  Fact Sheet for Patients: EntrepreneurPulse.com.au  Fact Sheet for Healthcare Providers: IncredibleEmployment.be  This test is not yet approved or cleared by the [place] FDA and has been authorized for detection and/or diagnosis of SARS-CoV-2 by FDA under an Emergency Use Authorization (EUA). This EUA will remain in effect (meaning this test can be used) for the duration of the COVID-19 declaration under Section 564(b)(1) of the Act, 21 U.S.C. section 360bbb-3(b)(1), unless the authorization is terminated or revoked.  Performed at [facility], [place] [address], [place], [place] [address]          Radiology Studies: NM Pulmonary Perfusion  Result Date: 03/11/2021 CLINICAL DATA:  Left-sided chest pain. EXAM: NUCLEAR MEDICINE PERFUSION LUNG SCAN TECHNIQUE: Perfusion images were obtained in  multiple projections after intravenous injection of radiopharmaceutical. Ventilation scans intentionally deferred if perfusion scan and chest x-ray adequate for interpretation during COVID 19 epidemic. RADIOPHARMACEUTICALS:  4.4 mCi Tc-37mMAA IV COMPARISON:  September 27, 2013 FINDINGS: Predominant homogeneous distribution of tracer activity is seen throughout both lungs. A small, ill-defined nonsegmental area of mildly decreased perfusion is seen along the periphery of the upper left lung on the anterior view. No segmental or subsegmental perfusion defects are identified. IMPRESSION: Small nonsegmental area of decreased perfusion suspected within the left upper lobe without evidence to strongly suggest the presence of pulmonary embolism. Electronically Signed   By: TVirgina NorfolkM.D.   On: 03/11/2021 15:57   ECHOCARDIOGRAM COMPLETE  Result Date: 03/12/2021    ECHOCARDIOGRAM REPORT   Patient Name:   Meghan COSTDate of Exam: 03/12/2021 Medical Rec #:  [id number]        Height:       58.0 in Accession #:    [id number]  Weight:       91.8 lb Date of Birth:  [birth year]          BSA:          1.308 m Patient Age:    82 years          BP:           135/43 mmHg Patient Gender: F                 HR:           68 bpm. Exam Location:  Inpatient Procedure: 2D Echo, Cardiac Doppler and Color Doppler Indications:    I26.02 Pulmonary embolus  History:        Patient has prior history of Echocardiogram examinations, most                 recent 09/26/2012. VQ scan, Pulmonary HTN, Signs/Symptoms:Chest                 Pain and Shortness of Breath; Risk Factors:Hypertension. DVT.  Sonographer:    Merrie Roof RDCS Referring Phys: [place]  1. Left ventricular ejection fraction, by estimation, is 60 to 65%. The left ventricle has normal function. The left ventricle has no regional wall motion abnormalities. Left ventricular diastolic parameters are consistent with Grade I diastolic dysfunction  (impaired relaxation).  2. Ventricular septum is flattened in systole suggesting RV pressure overload. . Right ventricular systolic function is normal. The right ventricular size is mildly enlarged. There is severely elevated pulmonary artery systolic pressure.  3. Left atrial size was severely dilated.  4. Right atrial size was moderately dilated.  5. The mitral valve is abnormal. Mild mitral valve regurgitation. No evidence of mitral stenosis.  6. The tricuspid valve is abnormal. Tricuspid valve regurgitation is mild to moderate.  7. The aortic valve is tricuspid. There is moderate calcification of the aortic valve. There is moderate thickening of the aortic valve. Aortic valve regurgitation is mild to moderate. No aortic stenosis is present.  8. The inferior vena cava is normal in size with greater than 50% respiratory variability, suggesting right atrial pressure of 3 mmHg. FINDINGS  Left Ventricle: Left ventricular ejection fraction, by estimation, is 60 to 65%. The left ventricle has normal function. The left ventricle has no regional wall motion abnormalities. The left ventricular internal cavity size was normal in size. There is  no left ventricular hypertrophy. Left ventricular diastolic parameters are consistent with Grade I diastolic dysfunction (impaired relaxation). Normal left ventricular filling pressure. Right Ventricle: Ventricular septum is flattened in systole suggesting RV pressure overload. The right ventricular size is mildly enlarged. No increase in right ventricular wall thickness. Right ventricular systolic function is normal. There is severely elevated pulmonary artery systolic pressure. The tricuspid regurgitant velocity is 4.28 m/s, and with an assumed right atrial pressure of 3 mmHg, the estimated right ventricular systolic pressure is 30.0 mmHg. Left Atrium: Left atrial size was severely dilated. Right Atrium: Right atrial size was moderately dilated. Pericardium: There is no evidence  of pericardial effusion. Mitral Valve: The mitral valve is abnormal. There is mild thickening of the mitral valve leaflet(s). There is mild calcification of the mitral valve leaflet(s). Mild mitral annular calcification. Mild mitral valve regurgitation. No evidence of mitral valve stenosis. Tricuspid Valve: The tricuspid valve is abnormal. Tricuspid valve regurgitation is mild to moderate. No evidence of tricuspid stenosis. Aortic Valve: The aortic valve is tricuspid. There is moderate calcification of the aortic  valve. There is moderate thickening of the aortic valve. There is moderate aortic valve annular calcification. Aortic valve regurgitation is mild to moderate. Aortic regurgitation PHT measures 401 msec. No aortic stenosis is present. Aortic valve mean gradient measures 8.0 mmHg. Aortic valve peak gradient measures 15.1 mmHg. Aortic valve area, by VTI measures 2.06 cm. Pulmonic Valve: The pulmonic valve was not well visualized. Pulmonic valve regurgitation is mild. No evidence of pulmonic stenosis. Aorta: The aortic root is normal in size and structure. Venous: The inferior vena cava is normal in size with greater than 50% respiratory variability, suggesting right atrial pressure of 3 mmHg. IAS/Shunts: No atrial level shunt detected by color flow Doppler.  LEFT VENTRICLE PLAX 2D LVIDd:         3.80 cm   Diastology LVIDs:         2.50 cm   LV e' medial:    6.42 cm/s LV PW:         1.00 cm   LV E/e' medial:  11.5 LV IVS:        0.90 cm   LV e' lateral:   6.31 cm/s LVOT diam:     1.80 cm   LV E/e' lateral: 11.7 LV SV:         87 LV SV Index:   66 LVOT Area:     2.54 cm  RIGHT VENTRICLE RV Basal diam:  4.50 cm RV Mid diam:    3.30 cm LEFT ATRIUM             Index        RIGHT ATRIUM           Index LA diam:        3.50 cm 2.68 cm/m   RA Area:     18.90 cm LA Vol (A2C):   70.3 ml 53.75 ml/m  RA Volume:   61.40 ml  46.94 ml/m LA Vol (A4C):   76.3 ml 58.33 ml/m LA Biplane Vol: 78.9 ml 60.32 ml/m  AORTIC  VALVE AV Area (Vmax):    1.98 cm AV Area (Vmean):   1.86 cm AV Area (VTI):     2.06 cm AV Vmax:           194.00 cm/s AV Vmean:          134.000 cm/s AV VTI:            0.422 m AV Peak Grad:      15.1 mmHg AV Mean Grad:      8.0 mmHg LVOT Vmax:         151.00 cm/s LVOT Vmean:        97.900 cm/s LVOT VTI:          0.341 m LVOT/AV VTI ratio: 0.81 AI PHT:            401 msec  AORTA Ao Root diam: 2.70 cm Ao Asc diam:  2.80 cm MITRAL VALVE               TRICUSPID VALVE MV Area (PHT): 4.06 cm    TR Peak grad:   73.3 mmHg MV Decel Time: 187 msec    TR Vmax:        428.00 cm/s MV E velocity: 73.70 cm/s MV A velocity: 95.10 cm/s  SHUNTS MV E/A ratio:  0.77        Systemic VTI:  0.34 m  Systemic Diam: 1.80 cm Carlyle Dolly MD Electronically signed by Carlyle Dolly MD Signature Date/Time: 03/12/2021/1:25:57 PM    Final    VAS [place] LOWER EXTREMITY VENOUS (DVT) (7a-7p)  Result Date: 03/12/2021  Lower Venous DVT Study Patient Name:  Meghan Wise  Date of Exam:   03/11/2021 Medical Rec #: [id number]          Accession #:    [id number] Date of Birth: [birth year]           Patient Gender: F Patient Age:   37 years Exam Location:  [place] Procedure:      VAS [place] LOWER EXTREMITY VENOUS (DVT) Referring Phys: Wille Glaser KNAPP --------------------------------------------------------------------------------  Indications: Worsening shortness of breath - sent by DR.  Limitations: Calcific shadowing. Comparison Study: No previous exams Performing Technologist: Jody Hill RVT, RDMS  Examination Guidelines: A complete evaluation includes B-mode imaging, spectral Doppler, color Doppler, and power Doppler as needed of all accessible portions of each vessel. Bilateral testing is considered an integral part of a complete examination. Limited examinations for reoccurring indications may be performed as noted. The reflux portion of the exam is performed with the patient in reverse Trendelenburg.   +---------+---------------+---------+-----------+----------+-----------------+ RIGHT    CompressibilityPhasicitySpontaneityPropertiesThrombus Aging    +---------+---------------+---------+-----------+----------+-----------------+ CFV      Full           Yes      Yes                                    +---------+---------------+---------+-----------+----------+-----------------+ SFJ      Full                                                           +---------+---------------+---------+-----------+----------+-----------------+ FV Prox  Full           Yes      Yes                                    +---------+---------------+---------+-----------+----------+-----------------+ FV Mid   Full           Yes      Yes                                    +---------+---------------+---------+-----------+----------+-----------------+ FV DistalFull           Yes      Yes                                    +---------+---------------+---------+-----------+----------+-----------------+ PFV      Full                                                           +---------+---------------+---------+-----------+----------+-----------------+ POP      Partial        Yes      Yes  Age Indeterminate +---------+---------------+---------+-----------+----------+-----------------+ PTV      Full                                                           +---------+---------------+---------+-----------+----------+-----------------+ PERO     Full                                                           +---------+---------------+---------+-----------+----------+-----------------+   +---------+---------------+---------+-----------+----------+--------------+ LEFT     CompressibilityPhasicitySpontaneityPropertiesThrombus Aging +---------+---------------+---------+-----------+----------+--------------+ CFV      Full           Yes      Yes                                  +---------+---------------+---------+-----------+----------+--------------+ SFJ      Full                                                        +---------+---------------+---------+-----------+----------+--------------+ FV Prox  Full           Yes      Yes                                 +---------+---------------+---------+-----------+----------+--------------+ FV Mid   Full           Yes      Yes                                 +---------+---------------+---------+-----------+----------+--------------+ FV DistalFull           Yes      Yes                                 +---------+---------------+---------+-----------+----------+--------------+ PFV      Full                                                        +---------+---------------+---------+-----------+----------+--------------+ POP      Full           Yes      Yes                                 +---------+---------------+---------+-----------+----------+--------------+ PTV      Full                                                        +---------+---------------+---------+-----------+----------+--------------+  PERO     Full                                                        +---------+---------------+---------+-----------+----------+--------------+     Summary: BILATERAL: - No evidence of superficial venous thrombosis in the lower extremities, bilaterally. -No evidence of popliteal cyst, bilaterally. RIGHT: - Findings consistent with age indeterminate deep vein thrombosis involving the right popliteal vein. Subcutaneous edema seen in area of calf  LEFT: - There is no evidence of deep vein thrombosis in the lower extremity.  Subcutaneous edema seen in area of the calf.  *See table(s) above for measurements and observations. Electronically signed by Jamelle Haring on 03/12/2021 at 11:07:31 AM.    Final         Scheduled Meds:  ambrisentan  10 mg Oral Daily   amLODipine  5  mg Oral Daily   atorvastatin  20 mg Oral QHS   chlorthalidone  25 mg Oral QHS   enoxaparin (LOVENOX) injection  40 mg Subcutaneous Q24H   ferrous sulfate  324 mg Oral QHS   folic acid  197 mcg Oral QPM   levothyroxine  50 mcg Oral Q0600   mouth rinse  15 mL Mouth Rinse BID   pantoprazole  40 mg Oral Daily   predniSONE  5 mg Oral Q breakfast   tadalafil  40 mg Oral QPM   vitamin B-12  1,000 mcg Oral QHS   Continuous Infusions:   LOS: 0 days    Time spent: 25 mins.More than 50% of that time was spent in counseling and/or coordination of care.      Shelly Coss, MD [facility] P11/20/2022, 8:05 AM

## 2021-03-14 LAB — URINALYSIS, COMPLETE (UACMP) WITH MICROSCOPIC
Bacteria, UA: NONE SEEN
Bilirubin Urine: NEGATIVE
Glucose, UA: NEGATIVE mg/dL
Hgb urine dipstick: NEGATIVE
Ketones, ur: NEGATIVE mg/dL
Leukocytes,Ua: NEGATIVE
Nitrite: NEGATIVE
Protein, ur: NEGATIVE mg/dL
Specific Gravity, Urine: 1.013 (ref 1.005–1.030)
pH: 5 (ref 5.0–8.0)

## 2021-03-14 LAB — BASIC METABOLIC PANEL
Anion gap: 12 (ref 5–15)
BUN: 81 mg/dL — ABNORMAL HIGH (ref 8–23)
CO2: 28 mmol/L (ref 22–32)
Calcium: 8.3 mg/dL — ABNORMAL LOW (ref 8.9–10.3)
Chloride: 100 mmol/L (ref 98–111)
Creatinine, Ser: 2.1 mg/dL — ABNORMAL HIGH (ref 0.44–1.00)
GFR, Estimated: 23 mL/min — ABNORMAL LOW (ref >60)
Glucose, Bld: 84 mg/dL (ref 70–99)
Potassium: 3.7 mmol/L (ref 3.5–5.1)
Sodium: 140 mmol/L (ref 135–145)

## 2021-03-14 MED ORDER — APIXABAN 2.5 MG PO TABS
2.5000 mg | ORAL_TABLET | Freq: Two times a day (BID) | ORAL | Status: DC
  Administered 2021-03-14 – 2021-03-15 (×2): 2.5 mg via ORAL
  Filled 2021-03-14 (×2): qty 1

## 2021-03-14 NOTE — Consult Note (Signed)
[facility] Renal Consultation Note  Requesting MD: Adhikari Indication for Consultation: CKD  HPI:  Meghan Wise is a 85 y.o. female with scleroderma, ILD with pulmonary HTN and chronic resp failure on home O2, HTN, hypothyroidism and CKD-  followed by Dr. Marval Regal at [facility] with fairly stable crt in the mid 1's of late-  last seen in the summer-  crt 1.49.  She presented to her pulmonologist and then to the hospital on 11/18 with worsening SOB-  felt to possibly have PE but also DVT.  Also found to be volume overloaded so given diuretics-  presenting crt at baseline of 1.6-  has worsened over the last few days in the setting of diuresis and 13 pound weight loss with SOB back to baseline.  Crt up to 2.2 today - GFR 23 so patient is worried that that is "really bad" . Pts amlodipine and lasix were stopped yesterday when crt went up to 1.95 but as above worsened today.  She does not appear to be uremic-  feels at baseline   Creatinine, Ser  Date/Time Value Ref Range Status  03/14/2021 03:46 AM 2.10 (H) 0.44 - 1.00 mg/dL Final  03/13/2021 03:41 AM 1.95 (H) 0.44 - 1.00 mg/dL Final  03/12/2021 04:59 AM 1.56 (H) 0.44 - 1.00 mg/dL Final  03/11/2021 02:24 PM 1.44 (H) 0.44 - 1.00 mg/dL Final  03/10/2021 12:05 PM 1.60 (H) 0.40 - 1.20 mg/dL Final  02/19/2020 03:11 PM 2.14 (H) 0.44 - 1.00 mg/dL Final  04/02/2018 12:57 PM 1.58 (H) 0.44 - 1.00 mg/dL Final  03/29/2018 12:23 PM 1.52 (H) 0.44 - 1.00 mg/dL Final  03/14/2018 11:20 AM 1.40 (H) 0.40 - 1.20 mg/dL Final  01/16/2017 09:05 AM 1.41 (H) 0.44 - 1.00 mg/dL Final  07/22/2016 07:04 AM 1.47 (H) 0.44 - 1.00 mg/dL Final  07/21/2016 12:29 PM 1.42 (H) 0.44 - 1.00 mg/dL Final  05/07/2014 10:57 AM 1.74 (H) 0.50 - 1.10 mg/dL Final  09/27/2013 11:30 AM 1.82 (H) 0.50 - 1.10 mg/dL Final  05/28/2012 04:47 AM 2.36 (H) 0.50 - 1.10 mg/dL Final  05/27/2012 06:50 AM 2.40 (H) 0.50 - 1.10 mg/dL Final  05/26/2012 05:50 AM 2.61 (H) 0.50 - 1.10 mg/dL  Final  05/25/2012 05:00 AM 3.35 (H) 0.50 - 1.10 mg/dL Final  05/24/2012 11:53 AM 3.39 (H) 0.50 - 1.10 mg/dL Final     PMHx:   Past Medical History:  Diagnosis Date   Heart failure ([place])    HTN (hypertension)    Hyperlipidemia    Hypothyroidism    Kidney disease    Stage IV- Dr. Servando Salina -LOV 02-11-14   Osteoarthritis    Osteoporosis    Pulmonary hypertension ([place])    Raynaud disease    reactive with cold and stress mostly fingers.   Scleroderma ([place])    lungs and kidneys   Shortness of breath dyspnea    with exertion-in Cardiac rehab program at [facility].    Past Surgical History:  Procedure Laterality Date   BREAST BIOPSY Bilateral    x    CARDIAC CATHETERIZATION     2'15 [facility]   CHOLECYSTECTOMY     CHOLECYSTECTOMY, LAPAROSCOPIC     COLONOSCOPY WITH PROPOFOL N/A 05/07/2014   Procedure: COLONOSCOPY WITH PROPOFOL;  Surgeon: Inda Castle, MD;  Location: [facility] ENDOSCOPY;  Service: Endoscopy;  Laterality: N/A;   ESOPHAGOGASTRODUODENOSCOPY (EGD) WITH PROPOFOL N/A 05/07/2014   Procedure: ESOPHAGOGASTRODUODENOSCOPY (EGD) WITH PROPOFOL;  Surgeon: Inda Castle, MD;  Location: [facility] ENDOSCOPY;  Service: Endoscopy;  Laterality: N/A;   HOT HEMOSTASIS N/A 05/07/2014   Procedure: HOT HEMOSTASIS (ARGON PLASMA COAGULATION/BICAP);  Surgeon: Inda Castle, MD;  Location: [facility];  Service: Endoscopy;  Laterality: N/A;  deuodenal bulb   SHOULDER ARTHROSCOPY W/ ROTATOR CUFF REPAIR Right     Family Hx:  Family History  Problem Relation Age of Onset   Heart disease Mother    Heart disease Father    Cancer Other        husband--esophageal   Diabetes Son    Stroke Daughter    Breast cancer Neg Hx     Social History:  reports that she quit smoking about 33 years ago. Her smoking use included cigarettes. She has a 90.00 pack-year smoking history. She has never used smokeless tobacco. She reports current alcohol use. She reports that she does not use drugs.  Allergies:  Allergies   Allergen Reactions   Losartan Other (See Comments) and Cough    High calcium count and renal problems   Nsaids     Other reaction(s): Kidney Disorder   Ace Inhibitors Other (See Comments) and Cough    Dizziness and coughing    Sulfa Antibiotics Hives   Codeine Nausea Only   Heparin Anxiety and Other (See Comments)    Pt reports that they have a sensitivity towards heparin. Pt becomes depressed and anxious to the point of crying uncontrollably.     Medications: Prior to Admission medications   Medication Sig Start Date End Date Taking? Authorizing Provider  Acetaminophen 500 MG capsule Take 1,000 mg by mouth every 6 (six) hours as needed for fever.   Yes [provider]  ambrisentan (LETAIRIS) 10 MG tablet Take 10 mg by mouth daily.    Yes [provider]  amLODipine (NORVASC) 5 MG tablet Take 5 mg by mouth daily. 01/24/21 01/24/22 Yes [provider]  atorvastatin (LIPITOR) 20 MG tablet Take 20 mg by mouth daily.   Yes [provider]  chlorthalidone (HYGROTON) 25 MG tablet Take 25 mg by mouth daily.   Yes [provider]  Cyanocobalamin (VITAMIN B-12 ER PO) Take 1 tablet by mouth daily.   Yes [provider]  denosumab (PROLIA) 60 MG/ML SOSY injection Inject 60 mg into the skin every 6 (six) months.   Yes [provider]  ferrous sulfate 324 MG TBEC Take 324 mg by mouth daily.   Yes [provider]  folic acid (FOLVITE) 638 MCG tablet Take 400 mcg by mouth every evening.    Yes [provider]  levothyroxine (SYNTHROID, LEVOTHROID) 50 MCG tablet Take 50 mcg by mouth every morning.   Yes [provider]  loperamide (IMODIUM A-D) 2 MG tablet Take 4 mg by mouth 4 (four) times daily as needed for diarrhea or loose stools.    Yes [provider]  Melatonin 10 MG TABS Take 10 mg by mouth daily as needed (sleep).    Yes [provider]  metroNIDAZOLE (FLAGYL) 500 MG tablet Take 500 mg by  mouth 3 (three) times daily. 02/11/21  Yes [provider]  omeprazole (PRILOSEC) 20 MG capsule Take 20 mg by mouth daily.   Yes [provider]  OXYGEN Inhale 2-4 L into the lungs continuous. 4 L with exertion 2 L at night   Yes [provider]  predniSONE (DELTASONE) 10 MG tablet Take 5 mg by mouth daily with breakfast.   Yes [provider]  tadalafil (ADCIRCA/CIALIS) 20 MG tablet Take 40 mg by  mouth every evening.    Yes [provider]  torsemide (DEMADEX) 10 MG tablet Take 10 mg by mouth daily.   Yes [provider]  allopurinol (ZYLOPRIM) 100 MG tablet Take 100 mg by mouth daily. Patient not taking: Reported on 03/10/2021 11/13/16   [provider]  atovaquone (MEPRON) 750 MG/5ML suspension Take 10 mLs (1,500 mg total) by mouth daily with breakfast. Patient not taking: Reported on 03/10/2021 05/10/18   Juanito Doom, MD  fluticasone (FLONASE) 50 MCG/ACT nasal spray Place 2 sprays into both nostrils daily. Patient not taking: Reported on 03/10/2021 01/17/21   Rozetta Nunnery, MD  mycophenolate (CELLCEPT) 250 MG capsule Take 3 capsules (750 mg total) by mouth 2 (two) times daily. Patient not taking: Reported on 03/10/2021 05/10/18   Juanito Doom, MD  predniSONE (DELTASONE) 2.5 MG tablet Take 1 tablet (2.5 mg total) by mouth daily. Patient not taking: Reported on 03/10/2021 12/27/18   Parrett, Fonnie Mu, NP    I have reviewed the patient's current medications.  Labs:  Results for orders placed or performed during the hospital encounter of 03/11/21 (from the past 48 hour(s))  CBC with Differential/Platelet     Status: Abnormal   Collection Time: 03/13/21  3:41 AM  Result Value Ref Range   WBC 7.7 4.0 - 10.5 K/uL   RBC 2.79 (L) 3.87 - 5.11 MIL/uL   Hemoglobin 9.1 (L) 12.0 - 15.0 g/dL   HCT 27.8 (L) 36.0 - 46.0 %   MCV 99.6 80.0 - 100.0 fL   MCH 32.6 26.0 - 34.0 pg   MCHC 32.7 30.0 - 36.0 g/dL   RDW 14.6 11.5 -  15.5 %   Platelets 296 150 - 400 K/uL   nRBC 0.0 0.0 - 0.2 %   Neutrophils Relative % 62 %   Neutro Abs 4.8 1.7 - 7.7 K/uL   Lymphocytes Relative 21 %   Lymphs Abs 1.6 0.7 - 4.0 K/uL   Monocytes Relative 11 %   Monocytes Absolute 0.9 0.1 - 1.0 K/uL   Eosinophils Relative 5 %   Eosinophils Absolute 0.4 0.0 - 0.5 K/uL   Basophils Relative 1 %   Basophils Absolute 0.0 0.0 - 0.1 K/uL   Immature Granulocytes 0 %   Abs Immature Granulocytes 0.02 0.00 - 0.07 K/uL    Comment: Performed at [facility], [place] [address] [place], [place] [address]  Basic metabolic panel     Status: Abnormal   Collection Time: 03/13/21  3:41 AM  Result Value Ref Range   Sodium 143 135 - 145 mmol/L   Potassium 3.5 3.5 - 5.1 mmol/L   Chloride 101 98 - 111 mmol/L   CO2 30 22 - 32 mmol/L   Glucose, Bld 85 70 - 99 mg/dL    Comment: Glucose reference range applies only to samples taken after fasting for at least 8 hours.   BUN 65 (H) 8 - 23 mg/dL   Creatinine, Ser 1.95 (H) 0.44 - 1.00 mg/dL   Calcium 8.7 (L) 8.9 - 10.3 mg/dL   GFR, Estimated 25 (L) >60 mL/min    Comment: (NOTE) Calculated using the CKD-EPI Creatinine Equation (2021)    Anion gap 12 5 - 15    Comment: Performed at [facility], [place] [address], [place],  [address]  Basic metabolic panel     Status: Abnormal   Collection Time: 03/14/21  3:46 AM  Result Value Ref Range   Sodium 140 135 -  145 mmol/L   Potassium 3.7 3.5 - 5.1 mmol/L   Chloride 100 98 - 111 mmol/L   CO2 28 22 - 32 mmol/L   Glucose, Bld 84 70 - 99 mg/dL    Comment: Glucose reference range applies only to samples taken after fasting for at least 8 hours.   BUN 81 (H) 8 - 23 mg/dL   Creatinine, Ser 2.10 (H) 0.44 - 1.00 mg/dL   Calcium 8.3 (L) 8.9 - 10.3 mg/dL   GFR, Estimated 23 (L) >60 mL/min    Comment: (NOTE) Calculated using the CKD-EPI Creatinine Equation (2021)    Anion gap 12 5 - 15    Comment: Performed at [facility], [place] [address], [place], [place] [address]     ROS:  A comprehensive review of systems was negative.  Was previously SOB but now feels to be at baseline  Physical Exam: Vitals:   03/13/21 2020 03/14/21 0510  BP: 120/62 129/67  Pulse: 76 66  Resp: 18 16  Temp: 98.3 F (36.8 C) 98.6 F (37 C)  SpO2: 98% 100%     General: thin WF- NAD-  very mobile in bed HEENT: PERRLA, EOMI, mucous membranes moist Neck: no JVD Heart: RRR Lungs: actually clear Abdomen: think, soft, non tender Extremities: no edema Skin: warm and dry Neuro: alert, non focal   Assessment/Plan: 85 year old WF with pulmonary HTN and home O2 req and also CKD - now with A on CRF in the setting of diuresis and hypotension 1.Renal- acute on chronic renal failure in the setting of above.  I feel the appropriate actions have been taken to stop amlodipine and lasix in the setting of rising crt-  I would not have done anything any different and do not recommend anything specific right now. Will check U/A for completeness sake.  I would say if crt no worse tomorrow this could be followed as an OP  2. Hypertension/volume  - euvolemic and BP is good-  no agents needed right now-  would resume previous dose of diuretics at discharge and [facility] can monitor some OP labs in a few days 3. Anemia  - hgb 9.1-  has decreased this admit-  will  check tomorrow as well as check iron stores to see if IV iron is needed    Louis Meckel 03/14/2021, 11:51 AM

## 2021-03-14 NOTE — Progress Notes (Signed)
PROGRESS NOTE    Meghan Wise  XKG:[id number] DOB: 05-Jul-1935 DOA: 03/11/2021 PCP: Carol Ada, MD   Chief Complain: Shortness of breath  Brief Narrative: Patient is a 85 year old female with history of pulmonary hypertension, scleroderma, hypertension, anemia, CKD stage 3b, hypothyroidism who presented to the emergency department with complaints of chest pain, shortness of breath that started about a week ago.  She was seen by her pulmonologist Dr. Lake Bells, and was planned to get a CTA of the chest to rule out PE but instructed to come to the emergency department.  She uses 4 L of oxygen at home.  VQ scan done in the emergency department was not strongly suggestive of PE but showed small nonsegmental area of decreased perfusion within the left upper lobe.  Lower extremity Doppler showed age indeterminant DVT of right popliteal vein.  Patient was started on  Lovenox for possible DVT/PE.  Hospital course remarkable for worsening kidney function.  Nephrology consulted today.  Assessment & Plan:   Principal Problem:   DVT (deep venous thrombosis) (HCC) Active Problems:   Hypothyroidism   Chronic diastolic CHF (congestive heart failure), NYHA class 4 (HCC)   CKD (chronic kidney disease) stage 4, GFR 15-29 ml/min (HCC)   Anemia of chronic disease   ILD (interstitial lung disease) (HCC)   Pulmonary hypertension (HCC)   CHF (congestive heart failure) (HCC)   Worsening dyspnea/acute on chronic hypoxic respiratory failure/ Has history of pulmonary artery hypertension.  Uses 4 L of oxygen at baseline.  Presented with persistent shortness of breath despite being on supplemental oxygen.She follows with pulmonology, Dr. Lake Bells.  PCCM was consulted. Currently on baseline oxygen requirement.  Denies any dyspnea today.  Indeterminate right lower extremity DVT/suspected PE:VQ scan done in the emergency department was not strongly suggestive of PE but showed small nonsegmental area of decreased  perfusion within the left upper lobe.  Lower extremity Doppler showed age indeterminant DVT of right popliteal vein.  Patient was started on  Lovenox for possible DVT/PE.  Changed to  Eliquis 2.5 mg twice a day.  Diastolic congestive heart failure: She takes torsemide 10 mg daily at home. Will get 2D echo.  Continue input/output monitoring. Last 2D echo done on April this year showed EF of 55% with grade 2 diastolic dysfunction.  Elevated BNP on admission.  We started on  Lasix 40 mg IV twice daily for now but now on hold due to worsening kidney function. Echo done here showed EF of 60 to 70%, grade 1 diastolic dysfunction, elevated pulmonary artery systolic pressure.  Currently appears euvolemic.  History of pulmonary artery hypertension with scleroderma: On tadalafil, Ambrisentan, steroid.  On 4 L of oxygen at home  Hypertension: Monitor blood pressure.  She was on amlodipine, chlorthalidone, torsemide  History of gout: On allopurinol.  Chronic normocytic anemia: Continue iron, vitamin B12, folate  History of hyperlipidemia: On Lipitor at home.  AKI on CKD stage 3b: Creatinine worsened today to the range of 2, diuresis has already been discontinued.  She was also transiently hypotensive 03/12/21, antihypertensives on hold.  Her baseline creatinine is around 1.5.  Continue monitoring.  Nephrology consulted.  Patient requesting for nephrology consultation.  Hypothyroidism: Continue Synthyroid  Debility/deconditioning: We have  requested for PT/OT evaluation ,no follow-up recommended           DVT prophylaxis:Lovenox Code Status: Full Family Communication: Called daughter on phone on [phone] today for update Status is: Observation    Consultants: PCCM  Procedures:None  Antimicrobials:  Anti-infectives (  From admission, onward)    None       Subjective: Patient seen and examined the bedside this morning.  Hemodynamically stable.  Comfortable.  On 4 days of oxygen  which is her baseline.  She really wants to go home.  I discussed about the importance of being seen by the nephrologist before we decide to discharge  Objective: Vitals:   03/13/21 0522 03/13/21 1524 03/13/21 2020 03/14/21 0510  BP: 115/65 113/65 120/62 129/67  Pulse: 65 84 76 66  Resp: _0 Temp: 98.6 F (37 C) 98.3 F (36.8 C) 98.3 F (36.8 C) 98.6 F (37 C)  TempSrc: Oral Oral Oral Oral  SpO2: 100% 99% 98% 100%  Weight:    35.7 kg  Height:        Intake/Output Summary (Last 24 hours) at 03/14/2021 0750 Last data filed at 03/14/2021 1792 Gross per 24 hour  Intake 220 ml  Output 900 ml  Net -680 ml   Filed Weights   03/12/21 1626 03/13/21 0500 03/14/21 0510  Weight: 36.8 kg 35.7 kg 35.7 kg    Examination:   General exam: Overall comfortable, not in distress, pleasant elderly female, deconditioned HEENT: PERRL Respiratory system:  no wheezes or crackles  Cardiovascular system: S1 & S2 heard, RRR.  Gastrointestinal system: Abdomen is nondistended, soft and nontender. Central nervous system: Alert and oriented Extremities: No edema, no clubbing ,no cyanosis Skin: No rashes, no ulcers,no icterus       Data Reviewed: I have personally reviewed following labs and imaging studies  CBC: Recent Labs  Lab 03/11/21 1424 03/13/21 0341  WBC 10.2 7.7  NEUTROABS 9.3* 4.8  HGB 10.2* 9.1*  HCT 31.4* 27.8*  MCV 101.0* 99.6  PLT 321 178   Basic Metabolic Panel: Recent Labs  Lab 03/10/21 1205 03/11/21 1424 03/12/21 0459 03/13/21 0341 03/14/21 0346  NA 144 143 145 143 140  K 3.5 3.9 3.5 3.5 3.7  CL 103 107 105 101 100  CO2 _1 GLUCOSE 151* 148* 76 85 84  BUN 57* 62* 62* 65* 81*  CREATININE 1.60* 1.44* 1.56* 1.95* 2.10*  CALCIUM 8.8 9.1 8.6* 8.7* 8.3*  MG  --   --  2.2  --   --    GFR: Estimated Creatinine Clearance: 11.4 mL/min (A) (by C-G formula based on SCr of 2.1 mg/dL (H)). Liver Function Tests: Recent Labs  Lab 03/11/21 1424  03/12/21 0459  AST 32 25  ALT 30 23  ALKPHOS 124 94  BILITOT 0.6 0.7  PROT 7.8 6.7  ALBUMIN 4.2 3.6   No results for input(s): LIPASE, AMYLASE in the last 168 hours. No results for input(s): AMMONIA in the last 168 hours. Coagulation Profile: No results for input(s): INR, PROTIME in the last 168 hours. Cardiac Enzymes: No results for input(s): CKTOTAL, CKMB, CKMBINDEX, TROPONINI in the last 168 hours. BNP (last 3 results) No results for input(s): PROBNP in the last 8760 hours. HbA1C: No results for input(s): HGBA1C in the last 72 hours. CBG: No results for input(s): GLUCAP in the last 168 hours. Lipid Profile: No results for input(s): CHOL, HDL, LDLCALC, TRIG, CHOLHDL, LDLDIRECT in the last 72 hours. Thyroid Function Tests: Recent Labs    03/12/21 0459  TSH 3.442   Anemia Panel: No results for input(s): VITAMINB12, FOLATE, FERRITIN, TIBC, IRON, RETICCTPCT in the last 72 hours. Sepsis Labs: No results for input(s): PROCALCITON, LATICACIDVEN in the last 168 hours.  Recent Results (from  the past 240 hour(s))  Resp Panel by RT-PCR (Flu A&B, Covid) Nasopharyngeal Swab     Status: None   Collection Time: 03/11/21  2:11 PM   Specimen: Nasopharyngeal Swab; Nasopharyngeal(NP) swabs in vial transport medium  Result Value Ref Range Status   SARS Coronavirus 2 by RT PCR NEGATIVE NEGATIVE Final    Comment: (NOTE) SARS-CoV-2 target nucleic acids are NOT DETECTED.  The SARS-CoV-2 RNA is generally detectable in upper respiratory specimens during the acute phase of infection. The lowest concentration of SARS-CoV-2 viral copies this assay can detect is 138 copies/mL. A negative result does not preclude SARS-Cov-2 infection and should not be used as the sole basis for treatment or other patient management decisions. A negative result may occur with  improper specimen collection/handling, submission of specimen other than nasopharyngeal swab, presence of viral mutation(s) within  the areas targeted by this assay, and inadequate number of viral copies(<138 copies/mL). A negative result must be combined with clinical observations, patient history, and epidemiological information. The expected result is Negative.  Fact Sheet for Patients:  EntrepreneurPulse.com.au  Fact Sheet for Healthcare Providers:  IncredibleEmployment.be  This test is no t yet approved or cleared by the [place] FDA and  has been authorized for detection and/or diagnosis of SARS-CoV-2 by FDA under an Emergency Use Authorization (EUA). This EUA will remain  in effect (meaning this test can be used) for the duration of the COVID-19 declaration under Section 564(b)(1) of the Act, 21 U.S.C.section 360bbb-3(b)(1), unless the authorization is terminated  or revoked sooner.       Influenza A by PCR NEGATIVE NEGATIVE Final   Influenza B by PCR NEGATIVE NEGATIVE Final    Comment: (NOTE) The Xpert Xpress SARS-CoV-2/FLU/RSV plus assay is intended as an aid in the diagnosis of influenza from Nasopharyngeal swab specimens and should not be used as a sole basis for treatment. Nasal washings and aspirates are unacceptable for Xpert Xpress SARS-CoV-2/FLU/RSV testing.  Fact Sheet for Patients: EntrepreneurPulse.com.au  Fact Sheet for Healthcare Providers: IncredibleEmployment.be  This test is not yet approved or cleared by the [place] FDA and has been authorized for detection and/or diagnosis of SARS-CoV-2 by FDA under an Emergency Use Authorization (EUA). This EUA will remain in effect (meaning this test can be used) for the duration of the COVID-19 declaration under Section 564(b)(1) of the Act, 21 U.S.C. section 360bbb-3(b)(1), unless the authorization is terminated or revoked.  Performed at [facility], [place] [address] [place], [place] [address]          Radiology  Studies: ECHOCARDIOGRAM COMPLETE  Result Date: 03/12/2021    ECHOCARDIOGRAM REPORT   Patient Name:   YULISA CHIRICO Date of Exam: 03/12/2021 Medical Rec #:  [id number]         Height:       58.0 in Accession #:    [id number]        Weight:       91.8 lb Date of Birth:  Sep 08, 1935          BSA:          1.308 m Patient Age:    15 years          BP:           135/43 mmHg Patient Gender: F                 HR:           68 bpm. Exam Location:  Inpatient  Procedure: 2D Echo, Cardiac Doppler and Color Doppler Indications:    I26.02 Pulmonary embolus  History:        Patient has prior history of Echocardiogram examinations, most                 recent 09/26/2012. VQ scan, Pulmonary HTN, Signs/Symptoms:Chest                 Pain and Shortness of Breath; Risk Factors:Hypertension. DVT.  Sonographer:    Merrie Roof RDCS Referring Phys: [place]  1. Left ventricular ejection fraction, by estimation, is 60 to 65%. The left ventricle has normal function. The left ventricle has no regional wall motion abnormalities. Left ventricular diastolic parameters are consistent with Grade I diastolic dysfunction (impaired relaxation).  2. Ventricular septum is flattened in systole suggesting RV pressure overload. . Right ventricular systolic function is normal. The right ventricular size is mildly enlarged. There is severely elevated pulmonary artery systolic pressure.  3. Left atrial size was severely dilated.  4. Right atrial size was moderately dilated.  5. The mitral valve is abnormal. Mild mitral valve regurgitation. No evidence of mitral stenosis.  6. The tricuspid valve is abnormal. Tricuspid valve regurgitation is mild to moderate.  7. The aortic valve is tricuspid. There is moderate calcification of the aortic valve. There is moderate thickening of the aortic valve. Aortic valve regurgitation is mild to moderate. No aortic stenosis is present.  8. The inferior vena cava is normal in size with greater  than 50% respiratory variability, suggesting right atrial pressure of 3 mmHg. FINDINGS  Left Ventricle: Left ventricular ejection fraction, by estimation, is 60 to 65%. The left ventricle has normal function. The left ventricle has no regional wall motion abnormalities. The left ventricular internal cavity size was normal in size. There is  no left ventricular hypertrophy. Left ventricular diastolic parameters are consistent with Grade I diastolic dysfunction (impaired relaxation). Normal left ventricular filling pressure. Right Ventricle: Ventricular septum is flattened in systole suggesting RV pressure overload. The right ventricular size is mildly enlarged. No increase in right ventricular wall thickness. Right ventricular systolic function is normal. There is severely elevated pulmonary artery systolic pressure. The tricuspid regurgitant velocity is 4.28 m/s, and with an assumed right atrial pressure of 3 mmHg, the estimated right ventricular systolic pressure is 16.0 mmHg. Left Atrium: Left atrial size was severely dilated. Right Atrium: Right atrial size was moderately dilated. Pericardium: There is no evidence of pericardial effusion. Mitral Valve: The mitral valve is abnormal. There is mild thickening of the mitral valve leaflet(s). There is mild calcification of the mitral valve leaflet(s). Mild mitral annular calcification. Mild mitral valve regurgitation. No evidence of mitral valve stenosis. Tricuspid Valve: The tricuspid valve is abnormal. Tricuspid valve regurgitation is mild to moderate. No evidence of tricuspid stenosis. Aortic Valve: The aortic valve is tricuspid. There is moderate calcification of the aortic valve. There is moderate thickening of the aortic valve. There is moderate aortic valve annular calcification. Aortic valve regurgitation is mild to moderate. Aortic regurgitation PHT measures 401 msec. No aortic stenosis is present. Aortic valve mean gradient measures 8.0 mmHg. Aortic valve  peak gradient measures 15.1 mmHg. Aortic valve area, by VTI measures 2.06 cm. Pulmonic Valve: The pulmonic valve was not well visualized. Pulmonic valve regurgitation is mild. No evidence of pulmonic stenosis. Aorta: The aortic root is normal in size and structure. Venous: The inferior vena cava is normal in size with greater than 50% respiratory variability,  suggesting right atrial pressure of 3 mmHg. IAS/Shunts: No atrial level shunt detected by color flow Doppler.  LEFT VENTRICLE PLAX 2D LVIDd:         3.80 cm   Diastology LVIDs:         2.50 cm   LV e' medial:    6.42 cm/s LV PW:         1.00 cm   LV E/e' medial:  11.5 LV IVS:        0.90 cm   LV e' lateral:   6.31 cm/s LVOT diam:     1.80 cm   LV E/e' lateral: 11.7 LV SV:         87 LV SV Index:   66 LVOT Area:     2.54 cm  RIGHT VENTRICLE RV Basal diam:  4.50 cm RV Mid diam:    3.30 cm LEFT ATRIUM             Index        RIGHT ATRIUM           Index LA diam:        3.50 cm 2.68 cm/m   RA Area:     18.90 cm LA Vol (A2C):   70.3 ml 53.75 ml/m  RA Volume:   61.40 ml  46.94 ml/m LA Vol (A4C):   76.3 ml 58.33 ml/m LA Biplane Vol: 78.9 ml 60.32 ml/m  AORTIC VALVE AV Area (Vmax):    1.98 cm AV Area (Vmean):   1.86 cm AV Area (VTI):     2.06 cm AV Vmax:           194.00 cm/s AV Vmean:          134.000 cm/s AV VTI:            0.422 m AV Peak Grad:      15.1 mmHg AV Mean Grad:      8.0 mmHg LVOT Vmax:         151.00 cm/s LVOT Vmean:        97.900 cm/s LVOT VTI:          0.341 m LVOT/AV VTI ratio: 0.81 AI PHT:            401 msec  AORTA Ao Root diam: 2.70 cm Ao Asc diam:  2.80 cm MITRAL VALVE               TRICUSPID VALVE MV Area (PHT): 4.06 cm    TR Peak grad:   73.3 mmHg MV Decel Time: 187 msec    TR Vmax:        428.00 cm/s MV E velocity: 73.70 cm/s MV A velocity: 95.10 cm/s  SHUNTS MV E/A ratio:  0.77        Systemic VTI:  0.34 m                            Systemic Diam: 1.80 cm Carlyle Dolly MD Electronically signed by Carlyle Dolly MD Signature  Date/Time: 03/12/2021/1:25:57 PM    Final         Scheduled Meds:  ambrisentan  10 mg Oral Daily   atorvastatin  20 mg Oral QHS   enoxaparin (LOVENOX) injection  40 mg Subcutaneous Q24H   ferrous sulfate  324 mg Oral QHS   folic acid  811 mcg Oral QPM   levothyroxine  50 mcg Oral Q0600   mouth rinse  15 mL  Mouth Rinse BID   pantoprazole  40 mg Oral Daily   predniSONE  5 mg Oral Q breakfast   tadalafil  40 mg Oral QPM   vitamin B-12  1,000 mcg Oral QHS   Continuous Infusions:   LOS: 1 day    Time spent: 25 mins.More than 50% of that time was spent in counseling and/or coordination of care.      Shelly Coss, MD [facility] P11/21/2022, 7:50 AM

## 2021-03-14 NOTE — Progress Notes (Addendum)
Pharmacy Consult for Pulmonary Hypertension Treatment   Indication - Continuation of prior to admission medication   Patient is 85 y.o.  with history of PAH on chronic Ambrisentan (Letairis) PTA and will be continued while hospitalized.   Continuing this medication order as an inpatient requires that monitoring parameters per REMS requirements must be met.  Chronic therapy is under the supervision of Sudarshan Rajagopal MD at [facility], who is enrolled in the REMS program and will be notified of continuation of therapy. A staff message in Epic, or other method of communication, will be sent notifying the certified prescriber.  Per patient report has previously been educated on Hepatotoxicity . On admission pregnancy risk has been assessed and no monitoring required. Pt 85 yr old Hepatic function has been evaluated. AST/ALT appropriate to continue medication at this time.  Hepatic Function Latest Ref Rng & Units 03/12/2021 03/11/2021 02/19/2020  Total Protein 6.5 - 8.1 g/dL 6.7 7.8 7.1  Albumin 3.5 - 5.0 g/dL 3.6 4.2 3.4(L)  AST 15 - 41 U/L 25 32 24  ALT 0 - 44 U/L _0 Alk Phosphatase 38 - 126 U/L 94 124 76  Total Bilirubin 0.3 - 1.2 mg/dL 0.7 0.6 0.4  Bilirubin, Direct 0.0 - 0.3 mg/dL - - -    If any question arise or pregnancy is identified during hospitalization, contact for bosentan and macitentan: [phone]; ambrisentan: [phone].  Thank for you allowing [place] to participate in the care of this patient.   Minda Ditto PharmD [facility] [phone] 03/14/2021, 11:01 AM

## 2021-03-15 LAB — BASIC METABOLIC PANEL
Anion gap: 13 (ref 5–15)
BUN: 88 mg/dL — ABNORMAL HIGH (ref 8–23)
CO2: 27 mmol/L (ref 22–32)
Calcium: 8.2 mg/dL — ABNORMAL LOW (ref 8.9–10.3)
Chloride: 103 mmol/L (ref 98–111)
Creatinine, Ser: 2.04 mg/dL — ABNORMAL HIGH (ref 0.44–1.00)
GFR, Estimated: 24 mL/min — ABNORMAL LOW (ref >60)
Glucose, Bld: 70 mg/dL (ref 70–99)
Potassium: 3.8 mmol/L (ref 3.5–5.1)
Sodium: 143 mmol/L (ref 135–145)

## 2021-03-15 LAB — CBC
HCT: 29.2 % — ABNORMAL LOW (ref 36.0–46.0)
Hemoglobin: 9.3 g/dL — ABNORMAL LOW (ref 12.0–15.0)
MCH: 32.1 pg (ref 26.0–34.0)
MCHC: 31.8 g/dL (ref 30.0–36.0)
MCV: 100.7 fL — ABNORMAL HIGH (ref 80.0–100.0)
Platelets: 276 10*3/uL (ref 150–400)
RBC: 2.9 MIL/uL — ABNORMAL LOW (ref 3.87–5.11)
RDW: 14.5 % (ref 11.5–15.5)
WBC: 8 10*3/uL (ref 4.0–10.5)
nRBC: 0 % (ref 0.0–0.2)

## 2021-03-15 LAB — IRON AND TIBC
Iron: 89 ug/dL (ref 28–170)
Saturation Ratios: 30 % (ref 10.4–31.8)
TIBC: 301 ug/dL (ref 250–450)
UIBC: 212 ug/dL

## 2021-03-15 LAB — FERRITIN: Ferritin: 26 ng/mL (ref 11–307)

## 2021-03-15 MED ORDER — TORSEMIDE 10 MG PO TABS: 10.0000 mg | ORAL_TABLET | Freq: Every day | ORAL | Status: DC

## 2021-03-15 MED ORDER — APIXABAN 2.5 MG PO TABS: 2.5000 mg | ORAL_TABLET | Freq: Two times a day (BID) | ORAL | 0 refills | Status: DC

## 2021-03-15 NOTE — Progress Notes (Signed)
Subjective:  500 UOP recorded -  crt stable to improved - plan is for her to go home off of amlodipine and diuretic but to resume diuretic in 3 days   Objective Vital signs in last 24 hours: Vitals:   03/14/21 1440 03/14/21 2044 03/15/21 0500 03/15/21 0642  BP: (!) 127/59 (!) 109/56  (!) 108/55  Pulse: 70 69  71  Resp: _0 Temp: 98.4 F (36.9 C) 98.2 F (36.8 C)  98.4 F (36.9 C)  TempSrc: Oral Oral  Oral  SpO2: 99% 99%  98%  Weight:   36.7 kg   Height:       Weight change: 1 kg  Intake/Output Summary (Last 24 hours) at 03/15/2021 1247 Last data filed at 03/15/2021 1200 Gross per 24 hour  Intake 360 ml  Output 800 ml  Net -440 ml   Assessment/Plan: 85 year old WF with pulmonary HTN and home O2 req and also CKD - now with A on CRF in the setting of diuresis and hypotension 1.Renal- acute on chronic renal failure in the setting of above.  I feel the appropriate actions have been taken to stop amlodipine and lasix in the setting of rising crt-  I would not have done anything any different and do not recommend anything specific right now. U/A negative so not indicative of anything concerning.  I am OK with this being followed as an OP.  I will arrange labs on Friday and try to get her in for an appt next week 2. Hypertension/volume  - euvolemic and BP is good-  no agents needed right now-  would resume previous dose of diuretics at discharge ( after 2-3 days) and CK[facility] can monitor some OP labs in a few days 3. Anemia  - hgb 9.1 9.3-  has decreased this admit-  will  check tomorrow as well as check iron stores to see if IV iron is needed.  Iron is a little low but cannot give iv iron here- hgb rebounded a little-  will check on Friday as well     Louis Meckel    Labs: Basic Metabolic Panel: Recent Labs  Lab 03/13/21 0341 03/14/21 0346 03/15/21 0327  NA 143 140 143  K 3.5 3.7 3.8  CL 101 100 103  CO2 _1 GLUCOSE 85 84 70  BUN 65* 81* 88*  CREATININE  1.95* 2.10* 2.04*  CALCIUM 8.7* 8.3* 8.2*   Liver Function Tests: Recent Labs  Lab 03/11/21 1424 03/12/21 0459  AST 32 25  ALT 30 23  ALKPHOS 124 94  BILITOT 0.6 0.7  PROT 7.8 6.7  ALBUMIN 4.2 3.6   No results for input(s): LIPASE, AMYLASE in the last 168 hours. No results for input(s): AMMONIA in the last 168 hours. CBC: Recent Labs  Lab 03/11/21 1424 03/13/21 0341 03/15/21 0327  WBC 10.2 7.7 8.0  NEUTROABS 9.3* 4.8  --   HGB 10.2* 9.1* 9.3*  HCT 31.4* 27.8* 29.2*  MCV 101.0* 99.6 100.7*  PLT 321 296 276   Cardiac Enzymes: No results for input(s): CKTOTAL, CKMB, CKMBINDEX, TROPONINI in the last 168 hours. CBG: No results for input(s): GLUCAP in the last 168 hours.  Iron Studies:  Recent Labs    03/15/21 0327  IRON 89  TIBC 301  FERRITIN 26   Studies/Results: No results found. Medications: Infusions:   Scheduled Medications:  ambrisentan  10 mg Oral Daily   apixaban  2.5 mg Oral BID  atorvastatin  20 mg Oral QHS   ferrous sulfate  324 mg Oral QHS   folic acid  680 mcg Oral QPM   levothyroxine  50 mcg Oral Q0600   mouth rinse  15 mL Mouth Rinse BID   pantoprazole  40 mg Oral Daily   predniSONE  5 mg Oral Q breakfast   tadalafil  40 mg Oral QPM   vitamin B-12  1,000 mcg Oral QHS    have reviewed scheduled and prn medications.  Physical Exam: General:  thin-  O2 req-  happy to be going home Heart: RRR Lungs: mostly clear Abdomen: soft, non tender Extremities: no edema     03/15/2021,12:47 PM  LOS: 2 days

## 2021-03-15 NOTE — Discharge Summary (Signed)
Physician Discharge Summary  Meghan Wise GMW:[id number] DOB: 1935/07/03 DOA: 03/11/2021  PCP: Carol Ada, MD  Admit date: 03/11/2021 Discharge date: 03/15/2021  Admitted From: Home Disposition:  Home  Discharge Condition:Stable CODE STATUS:FULL Diet recommendation: Heart Healthy    Brief/Interim Summary:  Patient is a 85 year old female with history of pulmonary hypertension, scleroderma, hypertension, anemia, CKD stage 3b, hypothyroidism who presented to the emergency department with complaints of chest pain, shortness of breath that started about a week ago.  She was seen by her pulmonologist Dr. Lake Bells, and was planned to get a CTA of the chest to rule out PE but instructed to come to the emergency department.  She uses 4 L of oxygen at home.  VQ scan done in the emergency department was not strongly suggestive of PE but showed small nonsegmental area of decreased perfusion within the left upper lobe.  Lower extremity Doppler showed age indeterminant DVT of right popliteal vein.  Patient was started on  Lovenox for possible DVT/PE.  Hospital course remarkable for worsening kidney function, now improving.  Anticoagulation has been changed to Eliquis.  Respiratory status is stable.  She is medically stable for discharge today.  She will follow-up with nephrology, pulmonology as an outpatient.  Following problems were addressed during her hospitalization:    Worsening dyspnea/acute on chronic hypoxic respiratory failure/ Has history of pulmonary artery hypertension.  Uses 4 L of oxygen at baseline.  Presented with persistent shortness of breath despite being on supplemental oxygen.She follows with pulmonology, Dr. Lake Bells.  PCCM was consulted. Currently on baseline oxygen requirement.  Denies any dyspnea today.   Indeterminate right lower extremity DVT/suspected PE:VQ scan done in the emergency department was not strongly suggestive of PE but showed small nonsegmental area of  decreased perfusion within the left upper lobe.  Lower extremity Doppler showed age indeterminant DVT of right popliteal vein.  Patient was started on  Lovenox for possible DVT/PE.  Changed to  Eliquis 2.5 mg twice a day.   Diastolic congestive heart failure: She takes torsemide 10 mg daily at home. Will get 2D echo.  Continue input/output monitoring. Last 2D echo done on April this year showed EF of 55% with grade 2 diastolic dysfunction.  Elevated BNP on admission.  We started on  Lasix 40 mg IV twice daily for now but now on hold due to worsening kidney function. Echo done here showed EF of 60 to 44%, grade 1 diastolic dysfunction, elevated pulmonary artery systolic pressure.  Currently appears euvolemic.   History of pulmonary artery hypertension with scleroderma: On tadalafil, Ambrisentan, steroid.  On 4 L of oxygen at home   Hypertension: Monitor blood pressure.  She was on amlodipine, chlorthalidone, torsemide.  Blood pressure has been soft.  Amlodipine, chlorthalidone discontinued.   Chronic normocytic anemia: Continue iron, vitamin B12, folate   History of hyperlipidemia: On Lipitor at home.   AKI on CKD stage 3b: Creatinine worsened so diuresis has already been discontinued.  She was also transiently hypotensive 03/12/21, antihypertensives on hold.  Her baseline creatinine is around 1.5. Nephrology consulted.  Check BMP in a week   Hypothyroidism: Continue Synthyroid   Debility/deconditioning: We  requested for PT/OT evaluation ,no follow-up recommended      Discharge Diagnoses:  Principal Problem:   DVT (deep venous thrombosis) (HCC) Active Problems:   Hypothyroidism   Chronic diastolic CHF (congestive heart failure), NYHA class 4 (HCC)   CKD (chronic kidney disease) stage 4, GFR 15-29 ml/min (HCC)   Anemia of chronic  disease   ILD (interstitial lung disease) ([place])   Pulmonary hypertension (HCC)   CHF (congestive heart failure) ([place])    Discharge  Instructions  Discharge Instructions     Diet - low sodium heart healthy   Complete by: As directed    Discharge instructions   Complete by: As directed    1)Please take prescribed medications as instructed 2)resume taking torsemide only after 3 days 3)Follow up with her PCP in a week.  Do a BMP test to check your kidney function during the follow-up 4)Follow up with your nephrologist and pulmonologist. 5)Monitor your blood pressure at home.  Since your blood pressure has been on the lower side during this hospitalization, we have discontinued amlodipine and chlorthalidone.  Follow-up with your primary care physician before restarting them.   Increase activity slowly   Complete by: As directed       Allergies as of 03/15/2021       Reactions   Losartan Other (See Comments), Cough   High calcium count and renal problems   Nsaids    Other reaction(s): Kidney Disorder   Ace Inhibitors Other (See Comments), Cough   Dizziness and coughing   Sulfa Antibiotics Hives   Codeine Nausea Only   Heparin Anxiety, Other (See Comments)   Pt reports that they have a sensitivity towards heparin. Pt becomes depressed and anxious to the point of crying uncontrollably.         Medication List     STOP taking these medications    allopurinol 100 MG tablet Commonly known as: ZYLOPRIM   amLODipine 5 MG tablet Commonly known as: NORVASC   atovaquone 750 MG/5ML suspension Commonly known as: MEPRON   chlorthalidone 25 MG tablet Commonly known as: HYGROTON   fluticasone 50 MCG/ACT nasal spray Commonly known as: FLONASE   metroNIDAZOLE 500 MG tablet Commonly known as: FLAGYL   mycophenolate 250 MG capsule Commonly known as: CellCept       TAKE these medications    Acetaminophen 500 MG capsule Take 1,000 mg by mouth every 6 (six) hours as needed for fever.   ambrisentan 10 MG tablet Commonly known as: LETAIRIS Take 10 mg by mouth daily.   apixaban 2.5 MG Tabs  tablet Commonly known as: ELIQUIS Take 1 tablet (2.5 mg total) by mouth 2 (two) times daily.   atorvastatin 20 MG tablet Commonly known as: LIPITOR Take 20 mg by mouth daily.   denosumab 60 MG/ML Sosy injection Commonly known as: PROLIA Inject 60 mg into the skin every 6 (six) months.   ferrous sulfate 324 MG Tbec Take 324 mg by mouth daily.   folic acid 409 MCG tablet Commonly known as: FOLVITE Take 400 mcg by mouth every evening.   levothyroxine 50 MCG tablet Commonly known as: SYNTHROID Take 50 mcg by mouth every morning.   loperamide 2 MG tablet Commonly known as: IMODIUM A-D Take 4 mg by mouth 4 (four) times daily as needed for diarrhea or loose stools.   Melatonin 10 MG Tabs Take 10 mg by mouth daily as needed (sleep).   omeprazole 20 MG capsule Commonly known as: PRILOSEC Take 20 mg by mouth daily.   OXYGEN Inhale 2-4 L into the lungs continuous. 4 L with exertion 2 L at night   predniSONE 10 MG tablet Commonly known as: DELTASONE Take 5 mg by mouth daily with breakfast. What changed: Another medication with the same name was removed. Continue taking this medication, and follow the directions you see here.  tadalafil 20 MG tablet Commonly known as: CIALIS Take 40 mg by mouth every evening.   torsemide 10 MG tablet Commonly known as: DEMADEX Take 1 tablet (10 mg total) by mouth daily. Resume taking only after 3 days What changed: additional instructions   VITAMIN B-12 ER PO Take 1 tablet by mouth daily.        Follow-up Information     Carol Ada, MD. Schedule an appointment as soon as possible for a visit in 1 week(s).   Specialty: Gastroenterology Contact information: [place], [place] [address] [phone]         Donato Heinz, MD. Schedule an appointment as soon as possible for a visit in 2 week(s).   Specialty: Nephrology Contact information: [address] [place] [address] [phone]                 Allergies  Allergen Reactions   Losartan Other (See Comments) and Cough    High calcium count and renal problems   Nsaids     Other reaction(s): Kidney Disorder   Ace Inhibitors Other (See Comments) and Cough    Dizziness and coughing    Sulfa Antibiotics Hives   Codeine Nausea Only   Heparin Anxiety and Other (See Comments)    Pt reports that they have a sensitivity towards heparin. Pt becomes depressed and anxious to the point of crying uncontrollably.     Consultations: Nephrology   Procedures/Studies: DG Chest 2 View  Result Date: 03/10/2021 CLINICAL DATA:  Left-sided pleuritic pain EXAM: CHEST - 2 VIEW COMPARISON:  Radiograph 12/23/2020, chest CT 12/24/2020 FINDINGS: Unchanged cardiomediastinal silhouette. There are diffuse interstitial opacities increased from prior exam. Scattered nodular opacities consistent with known pulmonary nodules better seen on prior CT. Small left pleural effusion. No pneumothorax. No acute osseous abnormality. IMPRESSION: Increased diffuse interstitial opacities, likely representing mild pulmonary edema. Small left pleural effusion. Electronically Signed   By: Maurine Simmering M.D.   On: 03/10/2021 12:09   NM Pulmonary Perfusion  Result Date: 03/11/2021 CLINICAL DATA:  Left-sided chest pain. EXAM: NUCLEAR MEDICINE PERFUSION LUNG SCAN TECHNIQUE: Perfusion images were obtained in multiple projections after intravenous injection of radiopharmaceutical. Ventilation scans intentionally deferred if perfusion scan and chest x-ray adequate for interpretation during COVID 19 epidemic. RADIOPHARMACEUTICALS:  4.4 mCi Tc-23mMAA IV COMPARISON:  September 27, 2013 FINDINGS: Predominant homogeneous distribution of tracer activity is seen throughout both lungs. A small, ill-defined nonsegmental area of mildly decreased perfusion is seen along the periphery of the upper left lung on the anterior view. No segmental or subsegmental perfusion defects are  identified. IMPRESSION: Small nonsegmental area of decreased perfusion suspected within the left upper lobe without evidence to strongly suggest the presence of pulmonary embolism. Electronically Signed   By: TVirgina NorfolkM.D.   On: 03/11/2021 15:57   ECHOCARDIOGRAM COMPLETE  Result Date: 03/12/2021    ECHOCARDIOGRAM REPORT   Patient Name:   SBELVA KOZIELDate of Exam: 03/12/2021 Medical Rec #:  [id number]        Height:       58.0 in Accession #:    [id number]       Weight:       91.8 lb Date of Birth:  [birth year]         BSA:          1.308 m Patient Age:    89years          BP:  135/43 mmHg Patient Gender: F                 HR:           68 bpm. Exam Location:  Inpatient Procedure: 2D Echo, Cardiac Doppler and Color Doppler Indications:    I26.02 Pulmonary embolus  History:        Patient has prior history of Echocardiogram examinations, most                 recent 09/26/2012. VQ scan, Pulmonary HTN, Signs/Symptoms:Chest                 Pain and Shortness of Breath; Risk Factors:Hypertension. DVT.  Sonographer:    Merrie Roof RDCS Referring Phys: [place]  1. Left ventricular ejection fraction, by estimation, is 60 to 65%. The left ventricle has normal function. The left ventricle has no regional wall motion abnormalities. Left ventricular diastolic parameters are consistent with Grade I diastolic dysfunction (impaired relaxation).  2. Ventricular septum is flattened in systole suggesting RV pressure overload. . Right ventricular systolic function is normal. The right ventricular size is mildly enlarged. There is severely elevated pulmonary artery systolic pressure.  3. Left atrial size was severely dilated.  4. Right atrial size was moderately dilated.  5. The mitral valve is abnormal. Mild mitral valve regurgitation. No evidence of mitral stenosis.  6. The tricuspid valve is abnormal. Tricuspid valve regurgitation is mild to moderate.  7. The aortic valve is  tricuspid. There is moderate calcification of the aortic valve. There is moderate thickening of the aortic valve. Aortic valve regurgitation is mild to moderate. No aortic stenosis is present.  8. The inferior vena cava is normal in size with greater than 50% respiratory variability, suggesting right atrial pressure of 3 mmHg. FINDINGS  Left Ventricle: Left ventricular ejection fraction, by estimation, is 60 to 65%. The left ventricle has normal function. The left ventricle has no regional wall motion abnormalities. The left ventricular internal cavity size was normal in size. There is  no left ventricular hypertrophy. Left ventricular diastolic parameters are consistent with Grade I diastolic dysfunction (impaired relaxation). Normal left ventricular filling pressure. Right Ventricle: Ventricular septum is flattened in systole suggesting RV pressure overload. The right ventricular size is mildly enlarged. No increase in right ventricular wall thickness. Right ventricular systolic function is normal. There is severely elevated pulmonary artery systolic pressure. The tricuspid regurgitant velocity is 4.28 m/s, and with an assumed right atrial pressure of 3 mmHg, the estimated right ventricular systolic pressure is 93.7 mmHg. Left Atrium: Left atrial size was severely dilated. Right Atrium: Right atrial size was moderately dilated. Pericardium: There is no evidence of pericardial effusion. Mitral Valve: The mitral valve is abnormal. There is mild thickening of the mitral valve leaflet(s). There is mild calcification of the mitral valve leaflet(s). Mild mitral annular calcification. Mild mitral valve regurgitation. No evidence of mitral valve stenosis. Tricuspid Valve: The tricuspid valve is abnormal. Tricuspid valve regurgitation is mild to moderate. No evidence of tricuspid stenosis. Aortic Valve: The aortic valve is tricuspid. There is moderate calcification of the aortic valve. There is moderate thickening of the  aortic valve. There is moderate aortic valve annular calcification. Aortic valve regurgitation is mild to moderate. Aortic regurgitation PHT measures 401 msec. No aortic stenosis is present. Aortic valve mean gradient measures 8.0 mmHg. Aortic valve peak gradient measures 15.1 mmHg. Aortic valve area, by VTI measures 2.06 cm. Pulmonic Valve: The pulmonic valve was  not well visualized. Pulmonic valve regurgitation is mild. No evidence of pulmonic stenosis. Aorta: The aortic root is normal in size and structure. Venous: The inferior vena cava is normal in size with greater than 50% respiratory variability, suggesting right atrial pressure of 3 mmHg. IAS/Shunts: No atrial level shunt detected by color flow Doppler.  LEFT VENTRICLE PLAX 2D LVIDd:         3.80 cm   Diastology LVIDs:         2.50 cm   LV e' medial:    6.42 cm/s LV PW:         1.00 cm   LV E/e' medial:  11.5 LV IVS:        0.90 cm   LV e' lateral:   6.31 cm/s LVOT diam:     1.80 cm   LV E/e' lateral: 11.7 LV SV:         87 LV SV Index:   66 LVOT Area:     2.54 cm  RIGHT VENTRICLE RV Basal diam:  4.50 cm RV Mid diam:    3.30 cm LEFT ATRIUM             Index        RIGHT ATRIUM           Index LA diam:        3.50 cm 2.68 cm/m   RA Area:     18.90 cm LA Vol (A2C):   70.3 ml 53.75 ml/m  RA Volume:   61.40 ml  46.94 ml/m LA Vol (A4C):   76.3 ml 58.33 ml/m LA Biplane Vol: 78.9 ml 60.32 ml/m  AORTIC VALVE AV Area (Vmax):    1.98 cm AV Area (Vmean):   1.86 cm AV Area (VTI):     2.06 cm AV Vmax:           194.00 cm/s AV Vmean:          134.000 cm/s AV VTI:            0.422 m AV Peak Grad:      15.1 mmHg AV Mean Grad:      8.0 mmHg LVOT Vmax:         151.00 cm/s LVOT Vmean:        97.900 cm/s LVOT VTI:          0.341 m LVOT/AV VTI ratio: 0.81 AI PHT:            401 msec  AORTA Ao Root diam: 2.70 cm Ao Asc diam:  2.80 cm MITRAL VALVE               TRICUSPID VALVE MV Area (PHT): 4.06 cm    TR Peak grad:   73.3 mmHg MV Decel Time: 187 msec    TR Vmax:         428.00 cm/s MV E velocity: 73.70 cm/s MV A velocity: 95.10 cm/s  SHUNTS MV E/A ratio:  0.77        Systemic VTI:  0.34 m                            Systemic Diam: 1.80 cm Carlyle Dolly MD Electronically signed by Carlyle Dolly MD Signature Date/Time: 03/12/2021/1:25:57 PM    Final    VAS [place] LOWER EXTREMITY VENOUS (DVT) (7a-7p)  Result Date: 03/12/2021  Lower Venous DVT Study Patient Name:  LOUCILLE TAKACH  Date of Exam:  03/11/2021 Medical Rec #: [id number]          Accession #:    [id number] Date of Birth: March 16, 1936           Patient Gender: F Patient Age:   71 years Exam Location:  [facility] Procedure:      VAS [place] LOWER EXTREMITY VENOUS (DVT) Referring Phys: Wille Glaser KNAPP --------------------------------------------------------------------------------  Indications: Worsening shortness of breath - sent by DR.  Limitations: Calcific shadowing. Comparison Study: No previous exams Performing Technologist: Jody Hill RVT, RDMS  Examination Guidelines: A complete evaluation includes B-mode imaging, spectral Doppler, color Doppler, and power Doppler as needed of all accessible portions of each vessel. Bilateral testing is considered an integral part of a complete examination. Limited examinations for reoccurring indications may be performed as noted. The reflux portion of the exam is performed with the patient in reverse Trendelenburg.  +---------+---------------+---------+-----------+----------+-----------------+ RIGHT    CompressibilityPhasicitySpontaneityPropertiesThrombus Aging    +---------+---------------+---------+-----------+----------+-----------------+ CFV      Full           Yes      Yes                                    +---------+---------------+---------+-----------+----------+-----------------+ SFJ      Full                                                           +---------+---------------+---------+-----------+----------+-----------------+ FV Prox  Full            Yes      Yes                                    +---------+---------------+---------+-----------+----------+-----------------+ FV Mid   Full           Yes      Yes                                    +---------+---------------+---------+-----------+----------+-----------------+ FV DistalFull           Yes      Yes                                    +---------+---------------+---------+-----------+----------+-----------------+ PFV      Full                                                           +---------+---------------+---------+-----------+----------+-----------------+ POP      Partial        Yes      Yes                  Age Indeterminate +---------+---------------+---------+-----------+----------+-----------------+ PTV      Full                                                           +---------+---------------+---------+-----------+----------+-----------------+  PERO     Full                                                           +---------+---------------+---------+-----------+----------+-----------------+   +---------+---------------+---------+-----------+----------+--------------+ LEFT     CompressibilityPhasicitySpontaneityPropertiesThrombus Aging +---------+---------------+---------+-----------+----------+--------------+ CFV      Full           Yes      Yes                                 +---------+---------------+---------+-----------+----------+--------------+ SFJ      Full                                                        +---------+---------------+---------+-----------+----------+--------------+ FV Prox  Full           Yes      Yes                                 +---------+---------------+---------+-----------+----------+--------------+ FV Mid   Full           Yes      Yes                                 +---------+---------------+---------+-----------+----------+--------------+ FV DistalFull           Yes       Yes                                 +---------+---------------+---------+-----------+----------+--------------+ PFV      Full                                                        +---------+---------------+---------+-----------+----------+--------------+ POP      Full           Yes      Yes                                 +---------+---------------+---------+-----------+----------+--------------+ PTV      Full                                                        +---------+---------------+---------+-----------+----------+--------------+ PERO     Full                                                        +---------+---------------+---------+-----------+----------+--------------+  Summary: BILATERAL: - No evidence of superficial venous thrombosis in the lower extremities, bilaterally. -No evidence of popliteal cyst, bilaterally. RIGHT: - Findings consistent with age indeterminate deep vein thrombosis involving the right popliteal vein. Subcutaneous edema seen in area of calf  LEFT: - There is no evidence of deep vein thrombosis in the lower extremity.  Subcutaneous edema seen in area of the calf.  *See table(s) above for measurements and observations. Electronically signed by Jamelle Haring on 03/12/2021 at 11:07:31 AM.    Final       Subjective: Patient seen and examined the bedside this morning.  Hemodynamically stable for discharge.  Discharge planning discussed with the daughter at bedside  Discharge Exam: Vitals:   03/14/21 2044 03/15/21 0642  BP: (!) 109/56 (!) 108/55  Pulse: 69 71  Resp: 18 18  Temp: 98.2 F (36.8 C) 98.4 F (36.9 C)  SpO2: 99% 98%   Vitals:   03/14/21 1440 03/14/21 2044 03/15/21 0500 03/15/21 0642  BP: (!) 127/59 (!) 109/56  (!) 108/55  Pulse: 70 69  71  Resp: _0 Temp: 98.4 F (36.9 C) 98.2 F (36.8 C)  98.4 F (36.9 C)  TempSrc: Oral Oral  Oral  SpO2: 99% 99%  98%  Weight:   36.7 kg   Height:        General:  Pt is alert, awake, not in acute distress Cardiovascular: RRR, S1/S2 +, no rubs, no gallops Respiratory: CTA bilaterally, no wheezing, no rhonchi Abdominal: Soft, NT, ND, bowel sounds + Extremities: no edema, no cyanosis    The results of significant diagnostics from this hospitalization (including imaging, microbiology, ancillary and laboratory) are listed below for reference.     Microbiology: Recent Results (from the past 240 hour(s))  Resp Panel by RT-PCR (Flu A&B, Covid) Nasopharyngeal Swab     Status: None   Collection Time: 03/11/21  2:11 PM   Specimen: Nasopharyngeal Swab; Nasopharyngeal(NP) swabs in vial transport medium  Result Value Ref Range Status   SARS Coronavirus 2 by RT PCR NEGATIVE NEGATIVE Final    Comment: (NOTE) SARS-CoV-2 target nucleic acids are NOT DETECTED.  The SARS-CoV-2 RNA is generally detectable in upper respiratory specimens during the acute phase of infection. The lowest concentration of SARS-CoV-2 viral copies this assay can detect is 138 copies/mL. A negative result does not preclude SARS-Cov-2 infection and should not be used as the sole basis for treatment or other patient management decisions. A negative result may occur with  improper specimen collection/handling, submission of specimen other than nasopharyngeal swab, presence of viral mutation(s) within the areas targeted by this assay, and inadequate number of viral copies(<138 copies/mL). A negative result must be combined with clinical observations, patient history, and epidemiological information. The expected result is Negative.  Fact Sheet for Patients:  EntrepreneurPulse.com.au  Fact Sheet for Healthcare Providers:  IncredibleEmployment.be  This test is no t yet approved or cleared by the [place] FDA and  has been authorized for detection and/or diagnosis of SARS-CoV-2 by FDA under an Emergency Use Authorization (EUA). This EUA will remain   in effect (meaning this test can be used) for the duration of the COVID-19 declaration under Section 564(b)(1) of the Act, 21 U.S.C.section 360bbb-3(b)(1), unless the authorization is terminated  or revoked sooner.       Influenza A by PCR NEGATIVE NEGATIVE Final   Influenza B by PCR NEGATIVE NEGATIVE Final    Comment: (NOTE) The Xpert Xpress SARS-CoV-2/FLU/RSV plus assay is intended as  an aid in the diagnosis of influenza from Nasopharyngeal swab specimens and should not be used as a sole basis for treatment. Nasal washings and aspirates are unacceptable for Xpert Xpress SARS-CoV-2/FLU/RSV testing.  Fact Sheet for Patients: EntrepreneurPulse.com.au  Fact Sheet for Healthcare Providers: IncredibleEmployment.be  This test is not yet approved or cleared by the [place] FDA and has been authorized for detection and/or diagnosis of SARS-CoV-2 by FDA under an Emergency Use Authorization (EUA). This EUA will remain in effect (meaning this test can be used) for the duration of the COVID-19 declaration under Section 564(b)(1) of the Act, 21 U.S.C. section 360bbb-3(b)(1), unless the authorization is terminated or revoked.  Performed at [facility] [facility] [place] [address], [place], [place] [address]      Labs: BNP (last 3 results) Recent Labs    03/11/21 1424  BNP 323.5*   Basic Metabolic Panel: Recent Labs  Lab 03/11/21 1424 03/12/21 0459 03/13/21 0341 03/14/21 0346 03/15/21 0327  NA 143 145 143 140 143  K 3.9 3.5 3.5 3.7 3.8  CL 107 105 101 100 103  CO2 _0 GLUCOSE 148* 76 85 84 70  BUN 62* 62* 65* 81* 88*  CREATININE 1.44* 1.56* 1.95* 2.10* 2.04*  CALCIUM 9.1 8.6* 8.7* 8.3* 8.2*  MG  --  2.2  --   --   --    Liver Function Tests: Recent Labs  Lab 03/11/21 1424 03/12/21 0459  AST 32 25  ALT 30 23  ALKPHOS 124 94  BILITOT 0.6 0.7  PROT 7.8 6.7  ALBUMIN 4.2 3.6   No results for input(s):  LIPASE, AMYLASE in the last 168 hours. No results for input(s): AMMONIA in the last 168 hours. CBC: Recent Labs  Lab 03/11/21 1424 03/13/21 0341 03/15/21 0327  WBC 10.2 7.7 8.0  NEUTROABS 9.3* 4.8  --   HGB 10.2* 9.1* 9.3*  HCT 31.4* 27.8* 29.2*  MCV 101.0* 99.6 100.7*  PLT 321 296 276   Cardiac Enzymes: No results for input(s): CKTOTAL, CKMB, CKMBINDEX, TROPONINI in the last 168 hours. BNP: Invalid input(s): POCBNP CBG: No results for input(s): GLUCAP in the last 168 hours. D-Dimer No results for input(s): DDIMER in the last 72 hours. Hgb A1c No results for input(s): HGBA1C in the last 72 hours. Lipid Profile No results for input(s): CHOL, HDL, LDLCALC, TRIG, CHOLHDL, LDLDIRECT in the last 72 hours. Thyroid function studies No results for input(s): TSH, T4TOTAL, T3FREE, THYROIDAB in the last 72 hours.  Invalid input(s): FREET3 Anemia work up Recent Labs    03/15/21 0327  FERRITIN 26  TIBC 301  IRON 89   Urinalysis    Component Value Date/Time   COLORURINE YELLOW 03/14/2021 [place] 03/14/2021 1322   LABSPEC 1.013 03/14/2021 1322   PHURINE 5.0 03/14/2021 1322   GLUCOSEU NEGATIVE 03/14/2021 1322   HGBUR NEGATIVE 03/14/2021 1322   BILIRUBINUR NEGATIVE 03/14/2021 1322   KETONESUR NEGATIVE 03/14/2021 1322   PROTEINUR NEGATIVE 03/14/2021 1322   UROBILINOGEN 0.2 05/24/2012 1450   NITRITE NEGATIVE 03/14/2021 1322   LEUKOCYTESUR NEGATIVE 03/14/2021 1322   Sepsis Labs Invalid input(s): PROCALCITONIN,  WBC,  LACTICIDVEN Microbiology Recent Results (from the past 240 hour(s))  Resp Panel by RT-PCR (Flu A&B, Covid) Nasopharyngeal Swab     Status: None   Collection Time: 03/11/21  2:11 PM   Specimen: Nasopharyngeal Swab; Nasopharyngeal(NP) swabs in vial transport medium  Result Value Ref Range Status   SARS Coronavirus 2 by  RT PCR NEGATIVE NEGATIVE Final    Comment: (NOTE) SARS-CoV-2 target nucleic acids are NOT DETECTED.  The SARS-CoV-2 RNA is  generally detectable in upper respiratory specimens during the acute phase of infection. The lowest concentration of SARS-CoV-2 viral copies this assay can detect is 138 copies/mL. A negative result does not preclude SARS-Cov-2 infection and should not be used as the sole basis for treatment or other patient management decisions. A negative result may occur with  improper specimen collection/handling, submission of specimen other than nasopharyngeal swab, presence of viral mutation(s) within the areas targeted by this assay, and inadequate number of viral copies(<138 copies/mL). A negative result must be combined with clinical observations, patient history, and epidemiological information. The expected result is Negative.  Fact Sheet for Patients:  EntrepreneurPulse.com.au  Fact Sheet for Healthcare Providers:  IncredibleEmployment.be  This test is no t yet approved or cleared by the [place] FDA and  has been authorized for detection and/or diagnosis of SARS-CoV-2 by FDA under an Emergency Use Authorization (EUA). This EUA will remain  in effect (meaning this test can be used) for the duration of the COVID-19 declaration under Section 564(b)(1) of the Act, 21 U.S.C.section 360bbb-3(b)(1), unless the authorization is terminated  or revoked sooner.       Influenza A by PCR NEGATIVE NEGATIVE Final   Influenza B by PCR NEGATIVE NEGATIVE Final    Comment: (NOTE) The Xpert Xpress SARS-CoV-2/FLU/RSV plus assay is intended as an aid in the diagnosis of influenza from Nasopharyngeal swab specimens and should not be used as a sole basis for treatment. Nasal washings and aspirates are unacceptable for Xpert Xpress SARS-CoV-2/FLU/RSV testing.  Fact Sheet for Patients: EntrepreneurPulse.com.au  Fact Sheet for Healthcare Providers: IncredibleEmployment.be  This test is not yet approved or cleared by the [place] FDA and has been authorized for detection and/or diagnosis of SARS-CoV-2 by FDA under an Emergency Use Authorization (EUA). This EUA will remain in effect (meaning this test can be used) for the duration of the COVID-19 declaration under Section 564(b)(1) of the Act, 21 U.S.C. section 360bbb-3(b)(1), unless the authorization is terminated or revoked.  Performed at [facility], [place] [address], [place], [place] [address]     Please note: You were cared for by a hospitalist during your hospital stay. Once you are discharged, your primary care physician will handle any further medical issues. Please note that NO REFILLS for any discharge medications will be authorized once you are discharged, as it is imperative that you return to your primary care physician (or establish a relationship with a primary care physician if you do not have one) for your post hospital discharge needs so that they can reassess your need for medications and monitor your lab values.    Time coordinating discharge: 40 minutes  SIGNED:   Shelly Coss, MD  [facility] 03/15/2021, 10:58 AM Pager [phone]  If 7PM-7AM, please contact night-coverage [email or web address] Password [email or web address]

## 2021-03-15 NOTE — Plan of Care (Signed)

## 2021-03-21 DIAGNOSIS — D649 Anemia, unspecified: Secondary | ICD-10-CM | POA: Diagnosis not present

## 2021-03-21 DIAGNOSIS — N1832 Chronic kidney disease, stage 3b: Secondary | ICD-10-CM | POA: Diagnosis not present

## 2021-03-23 ENCOUNTER — Ambulatory Visit: Payer: Medicare PPO | Admitting: Pulmonary Disease

## 2021-03-23 ENCOUNTER — Other Ambulatory Visit: Payer: Self-pay

## 2021-03-23 ENCOUNTER — Encounter: Payer: Self-pay | Admitting: Pulmonary Disease

## 2021-03-23 VITALS — BP 108/62 | HR 68 | Temp 97.8°F | Ht <= 58 in | Wt 85.4 lb

## 2021-03-23 DIAGNOSIS — I129 Hypertensive chronic kidney disease with stage 1 through stage 4 chronic kidney disease, or unspecified chronic kidney disease: Secondary | ICD-10-CM | POA: Diagnosis not present

## 2021-03-23 DIAGNOSIS — N179 Acute kidney failure, unspecified: Secondary | ICD-10-CM | POA: Diagnosis not present

## 2021-03-23 DIAGNOSIS — D649 Anemia, unspecified: Secondary | ICD-10-CM | POA: Diagnosis not present

## 2021-03-23 DIAGNOSIS — I272 Pulmonary hypertension, unspecified: Secondary | ICD-10-CM

## 2021-03-23 DIAGNOSIS — J849 Interstitial pulmonary disease, unspecified: Secondary | ICD-10-CM

## 2021-03-23 DIAGNOSIS — N1832 Chronic kidney disease, stage 3b: Secondary | ICD-10-CM | POA: Diagnosis not present

## 2021-03-23 MED ORDER — TORSEMIDE 5 MG PO TABS: 5.0000 mg | ORAL_TABLET | Freq: Every day | ORAL | 0 refills | Status: DC

## 2021-03-23 NOTE — Progress Notes (Signed)
Meghan Wise    [id number]    1935-07-25  Primary Care Physician:Hung, Saralyn Pilar, MD  Referring Physician: Carol Ada, MD [address], [place],  [place] [address]  Chief complaint: Consult for interstitial lung disease, pulmonary hypertension  HPI: 85 year old female smoker with history of scleroderma, pulmonary hypertension, chronic hypoxic respiratory failure on 4 L oxygen, chronic kidney disease Referred for establishing care and ongoing management of her lung disease  She previously followed with Dr. Lake Bells and now with Dr. Daine Gravel at [facility] pulmonary hypertension clinic. For pulmonary hypertension she was started on Ambrisentan and tadalafil in March 2015.  Tyvaso was attempted in June 2021 but had to stop due to side effects in 2022 On torsemide for diuresis For scleroderma she follows with Dr. Dossie Der, rheumatology.  She has been unable to tolerate CellCept and is on chronic prednisone at 5 mg.  Chronic kidney disease is managed by Dr. Myna Hidalgo and GERD is managed by Dr. Benson Norway  Seen in pulmonary clinic in early November 2022 for dyspnea, chest pain with concern for PE.  She was hospitalized with high D-dimer, intermediate VQ scan and right popliteal vein DVT.  Started on Eliquis for anticoagulation and discharged on  Post discharge she is doing well though notes increase in dyspnea over the past 24 hours with some lower extremity edema.  She recently had labs at the nephrologist office showing stable creatinine of 1.74   Pets: Occupation: Exposures: Smoking history: Travel history: Relevant family history:   Outpatient Encounter Medications as of 03/23/2021  Medication Sig   Acetaminophen 500 MG capsule Take 1,000 mg by mouth every 6 (six) hours as needed for fever.   ambrisentan (LETAIRIS) 10 MG tablet Take 10 mg by mouth daily.    apixaban (ELIQUIS) 2.5 MG TABS tablet Take 1 tablet (2.5 mg total) by mouth 2 (two) times daily.   atorvastatin  (LIPITOR) 20 MG tablet Take 20 mg by mouth daily.   Cyanocobalamin (VITAMIN B-12 ER PO) Take 1 tablet by mouth daily.   denosumab (PROLIA) 60 MG/ML SOSY injection Inject 60 mg into the skin every 6 (six) months.   ferrous sulfate 324 MG TBEC Take 324 mg by mouth daily.   folic acid (FOLVITE) 099 MCG tablet Take 400 mcg by mouth every evening.    levothyroxine (SYNTHROID, LEVOTHROID) 50 MCG tablet Take 50 mcg by mouth every morning.   loperamide (IMODIUM A-D) 2 MG tablet Take 4 mg by mouth 4 (four) times daily as needed for diarrhea or loose stools.    Melatonin 10 MG TABS Take 10 mg by mouth daily as needed (sleep).    omeprazole (PRILOSEC) 20 MG capsule Take 20 mg by mouth daily.   OXYGEN Inhale 2-4 L into the lungs continuous. 4 L with exertion 2 L at night   predniSONE (DELTASONE) 5 MG tablet Take 5 mg by mouth daily with breakfast.   tadalafil (ADCIRCA/CIALIS) 20 MG tablet Take 40 mg by mouth every evening.    torsemide (DEMADEX) 10 MG tablet Take 1 tablet (10 mg total) by mouth daily. Resume taking only after 3 days   No facility-administered encounter medications on file as of 03/23/2021.    Allergies as of 03/23/2021 - Review Complete 03/23/2021  Allergen Reaction Noted   Losartan Other (See Comments) and Cough 07/05/2012   Nsaids  05/26/2020   Ace inhibitors Other (See Comments) and Cough 05/24/2012   Sulfa antibiotics Hives 05/24/2012   Codeine Nausea Only 05/24/2012  Heparin Anxiety and Other (See Comments) 04/04/2018    Past Medical History:  Diagnosis Date   Heart failure (HCC)    HTN (hypertension)    Hyperlipidemia    Hypothyroidism    Kidney disease    Stage IV- Dr. Servando Salina -LOV 02-11-14   Osteoarthritis    Osteoporosis    Pulmonary hypertension (HCC)    Raynaud disease    reactive with cold and stress mostly fingers.   Scleroderma ([place])    lungs and kidneys   Shortness of breath dyspnea    with exertion-in Cardiac rehab program at [facility]    Past  Surgical History:  Procedure Laterality Date   BREAST BIOPSY Bilateral    x    CARDIAC CATHETERIZATION     2'15 [facility]   CHOLECYSTECTOMY     CHOLECYSTECTOMY, LAPAROSCOPIC     COLONOSCOPY WITH PROPOFOL N/A 05/07/2014   Procedure: COLONOSCOPY WITH PROPOFOL;  Surgeon: Inda Castle, MD;  Location: [facility] ENDOSCOPY;  Service: Endoscopy;  Laterality: N/A;   ESOPHAGOGASTRODUODENOSCOPY (EGD) WITH PROPOFOL N/A 05/07/2014   Procedure: ESOPHAGOGASTRODUODENOSCOPY (EGD) WITH PROPOFOL;  Surgeon: Inda Castle, MD;  Location: [facility] ENDOSCOPY;  Service: Endoscopy;  Laterality: N/A;   HOT HEMOSTASIS N/A 05/07/2014   Procedure: HOT HEMOSTASIS (ARGON PLASMA COAGULATION/BICAP);  Surgeon: Inda Castle, MD;  Location: [facility];  Service: Endoscopy;  Laterality: N/A;  deuodenal bulb   SHOULDER ARTHROSCOPY W/ ROTATOR CUFF REPAIR Right     Family History  Problem Relation Age of Onset   Heart disease Mother    Heart disease Father    Cancer Other        husband--esophageal   Diabetes Son    Stroke Daughter    Breast cancer Neg Hx     Social History   Socioeconomic History   Marital status: Widowed    Spouse name: Not on file   Number of children: 5   Years of education: Not on file   Highest education level: Not on file  Occupational History   Occupation: Retired    Comment: Library Asst.  Tobacco Use   Smoking status: Former    Packs/day: 1.50    Years: 60.00    Pack years: 90.00    Types: Cigarettes    Quit date: 04/25/1987    Years since quitting: 33.9   Smokeless tobacco: Never  Vaping Use   Vaping Use: Never used  Substance and Sexual Activity   Alcohol use: Yes    Alcohol/week: 0.0 standard drinks    Comment: OCCASIONALLY- rare   Drug use: No   Sexual activity: Not on file  Other Topics Concern   Not on file  Social History Narrative   Not on file   Social Determinants of Health   Financial Resource Strain: Not on file  Food Insecurity: Not on file  Transportation Needs:  Not on file  Physical Activity: Not on file  Stress: Not on file  Social Connections: Not on file  Intimate Partner Violence: Not on file    Review of systems: Review of Systems  Constitutional: Negative for fever and chills.  HENT: Negative.   Eyes: Negative for blurred vision.  Respiratory: as per HPI  Cardiovascular: Negative for chest pain and palpitations.  Gastrointestinal: Negative for vomiting, diarrhea, blood per rectum. Genitourinary: Negative for dysuria, urgency, frequency and hematuria.  Musculoskeletal: Negative for myalgias, back pain and joint pain.  Skin: Negative for itching and rash.  Neurological: Negative for dizziness, tremors, focal weakness, seizures and loss  of consciousness.  Endo/Heme/Allergies: Negative for environmental allergies.  Psychiatric/Behavioral: Negative for depression, suicidal ideas and hallucinations.  All other systems reviewed and are negative.  Physical Exam: Blood pressure 108/62, pulse 68, temperature 97.8 F (36.6 C), temperature source Oral, height _0  (1.473 m), weight 85 lb 6.4 oz (38.7 kg), SpO2 98 %. Gen:      No acute distress HEENT:  EOMI, sclera anicteric Neck:     No masses; no thyromegaly Lungs:    Clear to auscultation bilaterally; normal respiratory effort CV:         Regular rate and rhythm; no murmurs Abd:      + bowel sounds; soft, non-tender; no palpable masses, no distension Ext:    No edema; adequate peripheral perfusion Skin:      Warm and dry; no rash Neuro: alert and oriented x 3 Psych: normal mood and affect  Data Reviewed: Imaging: HRCT 07/03/2018-biapical pleural-parenchymal scarring, emphysema.  No clear evidence of interstitial lung disease  CT chest 12/24/2020-stable lung nodules, background emphysema and scarring. I have reviewed the images personally.  PFTs: 06/25/2018 FVC 1.82 [103%], FEV1 1.33 [104%], F/F 73, TLC 3.89 [9 9%], DLCO 5.62 [39%] Severe diffusion impairment  Labs: Labs from  nephrology office dated 03/21/2021 BUN 73, creatinine 1.74, hemoglobin 10.3.   Cardiac: 9/ 2019 [facility] from [facility] right atrial 8, PA pressure 72/32 mean 42 wedge pressure 14, cardiac index 3.4 L/min/m, PVR 6 [place] units  Assessment:  Scleroderma I have reviewed her scans to date with nonspecific scarring which appears postinflammatory.  I do not see any clear evidence of interstitial lung disease.  We will continue to monitor this  Schedule PFTs and follow-up in clinic in 3 months  Continues on 5 mg of prednisone per rheumatology  Pulmonary hypertension Maintained on tadalafil and Ambrisentan.  Follows at pulmonary hypertension clinic at [facility] She does have increased lower extremity edema today Increase torsemide to 15 mg for 3 days and then back to her baseline of 10 mg I have reviewed her recent kidney function which is at baseline  DVT, suspected PE Continue anticoagulation.  Would recommend lifelong therapy as the clot was unprovoked and she has severe pulmonary hypertension  Plan/Recommendations: PFTs Continue pulmonary hypertension medications Increase torsemide to 15 mg for 3 days and then back to 10 mg  Marshell Garfinkel MD [facility] 03/23/2021, 2:26 PM  CC: Carol Ada, MD

## 2021-03-23 NOTE — Patient Instructions (Signed)
I have reviewed the records of recent hospitalization Continue the Eliquis as directed Continue with pulmonary hypertension medication Given lower extremity leg swelling we will add 5 mg of torsemide for 3 days  Schedule PFTs in 3 months and follow-up in clinic

## 2021-03-28 ENCOUNTER — Other Ambulatory Visit: Payer: Self-pay | Admitting: Family Medicine

## 2021-03-28 DIAGNOSIS — Z1231 Encounter for screening mammogram for malignant neoplasm of breast: Secondary | ICD-10-CM

## 2021-03-31 DIAGNOSIS — I270 Primary pulmonary hypertension: Secondary | ICD-10-CM | POA: Diagnosis not present

## 2021-03-31 DIAGNOSIS — I129 Hypertensive chronic kidney disease with stage 1 through stage 4 chronic kidney disease, or unspecified chronic kidney disease: Secondary | ICD-10-CM | POA: Diagnosis not present

## 2021-03-31 DIAGNOSIS — I82409 Acute embolism and thrombosis of unspecified deep veins of unspecified lower extremity: Secondary | ICD-10-CM | POA: Diagnosis not present

## 2021-03-31 DIAGNOSIS — I509 Heart failure, unspecified: Secondary | ICD-10-CM | POA: Diagnosis not present

## 2021-03-31 DIAGNOSIS — J9691 Respiratory failure, unspecified with hypoxia: Secondary | ICD-10-CM | POA: Diagnosis not present

## 2021-03-31 DIAGNOSIS — M349 Systemic sclerosis, unspecified: Secondary | ICD-10-CM | POA: Diagnosis not present

## 2021-04-04 ENCOUNTER — Encounter (INDEPENDENT_AMBULATORY_CARE_PROVIDER_SITE_OTHER): Payer: Medicare PPO | Admitting: Ophthalmology

## 2021-04-14 ENCOUNTER — Telehealth: Payer: Self-pay | Admitting: Pulmonary Disease

## 2021-04-14 DIAGNOSIS — I739 Peripheral vascular disease, unspecified: Secondary | ICD-10-CM | POA: Diagnosis not present

## 2021-04-14 DIAGNOSIS — R32 Unspecified urinary incontinence: Secondary | ICD-10-CM | POA: Diagnosis not present

## 2021-04-14 DIAGNOSIS — E039 Hypothyroidism, unspecified: Secondary | ICD-10-CM | POA: Diagnosis not present

## 2021-04-14 DIAGNOSIS — I10 Essential (primary) hypertension: Secondary | ICD-10-CM | POA: Diagnosis not present

## 2021-04-14 DIAGNOSIS — M1990 Unspecified osteoarthritis, unspecified site: Secondary | ICD-10-CM | POA: Diagnosis not present

## 2021-04-14 DIAGNOSIS — K219 Gastro-esophageal reflux disease without esophagitis: Secondary | ICD-10-CM | POA: Diagnosis not present

## 2021-04-14 DIAGNOSIS — M810 Age-related osteoporosis without current pathological fracture: Secondary | ICD-10-CM | POA: Diagnosis not present

## 2021-04-14 DIAGNOSIS — E785 Hyperlipidemia, unspecified: Secondary | ICD-10-CM | POA: Diagnosis not present

## 2021-04-14 DIAGNOSIS — R636 Underweight: Secondary | ICD-10-CM | POA: Diagnosis not present

## 2021-04-14 MED ORDER — APIXABAN 2.5 MG PO TABS: 2.5000 mg | ORAL_TABLET | Freq: Two times a day (BID) | ORAL | 10 refills | Status: DC

## 2021-04-14 NOTE — Telephone Encounter (Signed)
I left a message that her refill was sent to the pharmacy. Nothing further needed.   Patient was asked to call back with any questions.

## 2021-04-22 DIAGNOSIS — N1832 Chronic kidney disease, stage 3b: Secondary | ICD-10-CM | POA: Diagnosis not present

## 2021-04-22 DIAGNOSIS — D649 Anemia, unspecified: Secondary | ICD-10-CM | POA: Diagnosis not present

## 2021-04-27 DIAGNOSIS — K219 Gastro-esophageal reflux disease without esophagitis: Secondary | ICD-10-CM | POA: Diagnosis not present

## 2021-04-27 DIAGNOSIS — M340 Progressive systemic sclerosis: Secondary | ICD-10-CM | POA: Diagnosis not present

## 2021-04-27 DIAGNOSIS — R197 Diarrhea, unspecified: Secondary | ICD-10-CM | POA: Diagnosis not present

## 2021-05-02 ENCOUNTER — Ambulatory Visit: Payer: Medicare PPO

## 2021-05-12 ENCOUNTER — Other Ambulatory Visit: Payer: Self-pay | Admitting: *Deleted

## 2021-05-12 MED ORDER — APIXABAN 2.5 MG PO TABS: 2.5000 mg | ORAL_TABLET | Freq: Two times a day (BID) | ORAL | 3 refills | Status: DC

## 2021-05-13 ENCOUNTER — Other Ambulatory Visit: Payer: Self-pay | Admitting: Family Medicine

## 2021-05-13 DIAGNOSIS — R9389 Abnormal findings on diagnostic imaging of other specified body structures: Secondary | ICD-10-CM

## 2021-05-18 ENCOUNTER — Telehealth: Payer: Self-pay | Admitting: Pulmonary Disease

## 2021-05-18 NOTE — Telephone Encounter (Signed)
Called and spoke with pt who states that she has been more SOB and also has been having a lot of chest discomfort for the past 3 days. Pt said that she has been coughing more when she exerts herself. States that the cough is a dry cough.  Pt states due to her symptoms, she is having to now use 5L O2 with exertion and 4L at rest. States that her sats usually around 94%.  Pt also states that she feels like her heart has been pounding and states when she has checked her BP and pulse, they have both been elevated.  Asked pt if she has had any temp and pt said that she has not had one that she knows of.  Due to pt's symptoms, she wants to know what could be recommended. Dr. Vaughan Browner, please advise.

## 2021-05-18 NOTE — Telephone Encounter (Signed)
It is hard to make a diagnosis over the telephone.  Looks like at the least she will need to get COVID tested at home and possibly flu tested If COVID test is negative then arrange clinic visit for chest x-ray

## 2021-05-18 NOTE — Telephone Encounter (Signed)
Called and spoke with patient to let her know that Dr. Vaughan Browner would like her to get covid tested and flu tested and to call us back with the results. Also let her know that if they are negative then we need to get her scheduled for an OV with a CXR. She expressed understanding. Will wait for her to call back with results

## 2021-05-19 ENCOUNTER — Other Ambulatory Visit: Payer: Medicare PPO

## 2021-05-19 NOTE — Telephone Encounter (Signed)
Called to check on the status of her testing, she states she is going to [facility] this afternoon to be tested.  Will await her return call with her results.

## 2021-05-20 ENCOUNTER — Other Ambulatory Visit: Payer: Self-pay | Admitting: Family Medicine

## 2021-05-20 ENCOUNTER — Ambulatory Visit
Admission: RE | Admit: 2021-05-20 | Discharge: 2021-05-20 | Disposition: A | Discharge status: Home / Self Care | Payer: Medicare PPO | Source: Ambulatory Visit | Attending: Family Medicine | Admitting: Family Medicine

## 2021-05-20 DIAGNOSIS — R0602 Shortness of breath: Secondary | ICD-10-CM

## 2021-05-20 DIAGNOSIS — R5382 Chronic fatigue, unspecified: Secondary | ICD-10-CM | POA: Diagnosis not present

## 2021-05-20 DIAGNOSIS — R059 Cough, unspecified: Secondary | ICD-10-CM | POA: Diagnosis not present

## 2021-05-20 DIAGNOSIS — J449 Chronic obstructive pulmonary disease, unspecified: Secondary | ICD-10-CM | POA: Diagnosis not present

## 2021-05-20 DIAGNOSIS — I270 Primary pulmonary hypertension: Secondary | ICD-10-CM | POA: Diagnosis not present

## 2021-05-20 DIAGNOSIS — I509 Heart failure, unspecified: Secondary | ICD-10-CM | POA: Diagnosis not present

## 2021-05-20 NOTE — Telephone Encounter (Signed)
Spoke with the pt  She states was seen by her PCP today and tested neg for flu, rsv, and covid  She had labs and cxr UA  I offered ov next wk and she says she will call us if she is not feeling better for appt  Nothing further needed

## 2021-05-25 DIAGNOSIS — K219 Gastro-esophageal reflux disease without esophagitis: Secondary | ICD-10-CM | POA: Diagnosis not present

## 2021-05-25 DIAGNOSIS — M340 Progressive systemic sclerosis: Secondary | ICD-10-CM | POA: Diagnosis not present

## 2021-05-25 DIAGNOSIS — R197 Diarrhea, unspecified: Secondary | ICD-10-CM | POA: Diagnosis not present

## 2021-05-26 ENCOUNTER — Telehealth: Payer: Self-pay | Admitting: Pulmonary Disease

## 2021-05-26 DIAGNOSIS — I509 Heart failure, unspecified: Secondary | ICD-10-CM | POA: Diagnosis not present

## 2021-05-26 DIAGNOSIS — D509 Iron deficiency anemia, unspecified: Secondary | ICD-10-CM | POA: Diagnosis not present

## 2021-05-26 DIAGNOSIS — J441 Chronic obstructive pulmonary disease with (acute) exacerbation: Secondary | ICD-10-CM | POA: Diagnosis not present

## 2021-05-26 DIAGNOSIS — I270 Primary pulmonary hypertension: Secondary | ICD-10-CM | POA: Diagnosis not present

## 2021-05-26 DIAGNOSIS — R002 Palpitations: Secondary | ICD-10-CM | POA: Diagnosis not present

## 2021-05-26 DIAGNOSIS — N184 Chronic kidney disease, stage 4 (severe): Secondary | ICD-10-CM | POA: Diagnosis not present

## 2021-05-26 NOTE — Telephone Encounter (Signed)
Dr Vaughan Browner has 4 openings for tomorrow!!   LMTCB.

## 2021-05-27 ENCOUNTER — Encounter: Payer: Self-pay | Admitting: Pulmonary Disease

## 2021-05-27 ENCOUNTER — Other Ambulatory Visit: Payer: Self-pay

## 2021-05-27 ENCOUNTER — Ambulatory Visit: Payer: Medicare PPO | Admitting: Pulmonary Disease

## 2021-05-27 VITALS — BP 148/58 | HR 68 | Temp 97.5°F | Ht <= 58 in | Wt 89.4 lb

## 2021-05-27 DIAGNOSIS — J849 Interstitial pulmonary disease, unspecified: Secondary | ICD-10-CM

## 2021-05-27 DIAGNOSIS — I272 Pulmonary hypertension, unspecified: Secondary | ICD-10-CM | POA: Diagnosis not present

## 2021-05-27 NOTE — Telephone Encounter (Signed)
Patient seen today at 12:00. Will close encounter.

## 2021-05-27 NOTE — Patient Instructions (Signed)
Increase the torsemide to 15 mg for 3 more days and please see Dr. Daine Gravel on Monday Will order a CT high-resolution to evaluate your lungs We will get labs from primary care Follow-up in 2 weeks

## 2021-05-27 NOTE — Progress Notes (Signed)
Meghan Wise    [id number]    1935/08/14  Primary Care Physician:Hung, Saralyn Pilar, MD  Referring Physician: Carol Ada, MD [address] [place],  [place] [address]  Chief complaint: Consult for interstitial lung disease, pulmonary hypertension  HPI: 86 year old female smoker with history of scleroderma, pulmonary hypertension, chronic hypoxic respiratory failure on 4 L oxygen, chronic kidney disease Referred for establishing care and ongoing management of her lung disease  She previously followed with Dr. Lake Bells and now with Dr. Daine Gravel at [facility] pulmonary hypertension clinic. For pulmonary hypertension she was started on Ambrisentan and tadalafil in March 2015.  Tyvaso was attempted in June 2021 but had to stop due to side effects in 2022 On torsemide for diuresis For scleroderma she follows with Dr. Dossie Der, rheumatology.  She has been unable to tolerate CellCept and is on chronic prednisone at 5 mg.  Chronic kidney disease is managed by Dr. Myna Hidalgo and GERD is managed by Dr. Benson Norway  Seen in pulmonary clinic in early November 2022 for dyspnea, chest pain with concern for PE.  She was hospitalized with high D-dimer, intermediate VQ scan and right popliteal vein DVT.  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  No feather pillows or comforters Smoking history: 90-pack-year smoker.  Quit at age 34 Travel history: Previously lived in [place] in [place].  No significant recent travel Relevant family history: No family history of lung disease  Interim history: Has complains of 2 weeks of increased shortness of breath with chest discomfort.  Increased her oxygen to 5 L oxygen with palpitations.  She tested negative for COVID, flu and RSV.  Treated with Z-Pak and 20 mg/day of prednisone by primary care with minimal improvement in symptoms  As per the patient she  had labs recently which showed a hemoglobin of 8.3 which is lower than her usual chronic anemia.  Her nephrologist Dr. Vanita Panda is considering Epogen and iron infusions  Outpatient Encounter Medications as of 05/27/2021  Medication Sig   Acetaminophen 500 MG capsule Take 1,000 mg by mouth every 6 (six) hours as needed for fever.   ambrisentan (LETAIRIS) 10 MG tablet Take 10 mg by mouth daily.    amLODipine (NORVASC) 5 MG tablet Take by mouth.   apixaban (ELIQUIS) 2.5 MG TABS tablet Take 1 tablet (2.5 mg total) by mouth 2 (two) times daily.   atorvastatin (LIPITOR) 20 MG tablet Take 20 mg by mouth daily.   Cyanocobalamin (VITAMIN B-12 ER PO) Take 1 tablet by mouth daily.   denosumab (PROLIA) 60 MG/ML SOSY injection Inject 60 mg into the skin every 6 (six) months.   ferrous sulfate 324 MG TBEC Take 324 mg by mouth daily.   folic acid (FOLVITE) 379 MCG tablet Take 400 mcg by mouth every evening.    levothyroxine (SYNTHROID, LEVOTHROID) 50 MCG tablet Take 50 mcg by mouth every morning.   loperamide (IMODIUM A-D) 2 MG tablet Take 4 mg by mouth 4 (four) times daily as needed for diarrhea or loose stools.    Melatonin 10 MG TABS Take 10 mg by mouth daily as needed (sleep).    omeprazole (PRILOSEC) 20 MG capsule Take 20 mg by mouth daily.   OXYGEN Inhale 2-4 L into the lungs continuous. 4 L with exertion 2 L at night   predniSONE (DELTASONE) 5 MG tablet Take 5 mg by mouth daily with breakfast.  tadalafil (ADCIRCA/CIALIS) 20 MG tablet Take 40 mg by mouth every evening.    torsemide (DEMADEX) 5 MG tablet Take 1 tablet (5 mg total) by mouth daily.   No facility-administered encounter medications on file as of 05/27/2021.   Physical Exam: Blood pressure (!) 148/58, pulse 68, temperature (!) 97.5 F (36.4 C), temperature source Oral, height _0  (1.473 m), weight 89 lb 6.4 oz (40.6 kg), SpO2 98 %. Gen:      No acute distress HEENT:  EOMI, sclera anicteric Neck:     No masses; no thyromegaly Lungs:     Clear to auscultation bilaterally; normal respiratory effort CV:         Regular rate and rhythm; no murmurs Abd:      + bowel sounds; soft, non-tender; no palpable masses, no distension Ext:    No edema; adequate peripheral perfusion Skin:      Warm and dry; no rash Neuro: alert and oriented x 3 Psych: normal mood and affect   Data Reviewed: Imaging: HRCT 07/03/2018-biapical pleural-parenchymal scarring, emphysema.  No clear evidence of interstitial lung disease  CT chest 12/24/2020-stable lung nodules, background emphysema and scarring. I have reviewed the images personally.  PFTs: 06/25/2018 FVC 1.82 [103%], FEV1 1.33 [104%], F/F 73, TLC 3.89 [9 9%], DLCO 5.62 [39%] Severe diffusion impairment  Labs: Labs from nephrology office dated 03/21/2021 BUN 73, creatinine 1.74, hemoglobin 10.3.   Cardiac: 9/ 2019 [facility] from [facility] right atrial 8, PA pressure 72/32 mean 42 wedge pressure 14, cardiac index 3.4 L/min/m, PVR 6 Woods units  Assessment:  Acute visit for dyspnea Etiology for worsening dyspnea is unclear.  She does not have significant ILD in the past but will need reevaluation with high-res CT I am also concerned about heart failure and worsening pulmonary hypertension.  She will increase her torsemide to 50 mg for 3 days and is due to see her pulmonary hypertension doctor at [facility] on Monday.    Her acute on chronic anemia is also likely playing a role in presentation with hemoglobin of 8.3.  This is being worked up by her primary care and nephrologist.  There is no evidence of acute bleed.  We will get labs from primary care for review. There is low suspicion for pulmonary embolism as she is on chronic anticoagulation with Eliquis  If there is worsening over the weekend and she will seek care in the emergency room.  Scleroderma I have reviewed her scans to date with nonspecific scarring which appears postinflammatory.  I do not see any clear evidence of interstitial  lung disease.  Repeat high-res CT as above Continues on 5 mg of prednisone per rheumatology  Pulmonary hypertension Maintained on tadalafil and Ambrisentan.  Follows at pulmonary hypertension clinic at [facility].  She has an acute visit scheduled for Monday 2/6  Prior DVT, suspected PE Continue anticoagulation.  Would recommend lifelong therapy as the clot was unprovoked and she has severe pulmonary hypertension  Plan/Recommendations: High-res CT Increase torsemide dose Follow-up at [facility] hypertension clinic on Monday Get labs from primary care  Marshell Garfinkel MD [facility] 05/27/2021, 12:11 PM  CC: Carol Ada, MD

## 2021-05-30 ENCOUNTER — Encounter (HOSPITAL_COMMUNITY): Payer: Self-pay

## 2021-05-30 ENCOUNTER — Emergency Department (HOSPITAL_COMMUNITY): Payer: Medicare PPO

## 2021-05-30 ENCOUNTER — Inpatient Hospital Stay (HOSPITAL_COMMUNITY)
Admission: EM | Admit: 2021-05-30 | Discharge: 2021-06-05 | DRG: 377 | Disposition: A | Discharge status: Home Health | Payer: Medicare PPO | Attending: Internal Medicine | Admitting: Internal Medicine

## 2021-05-30 DIAGNOSIS — R590 Localized enlarged lymph nodes: Secondary | ICD-10-CM | POA: Diagnosis present

## 2021-05-30 DIAGNOSIS — F419 Anxiety disorder, unspecified: Secondary | ICD-10-CM | POA: Diagnosis not present

## 2021-05-30 DIAGNOSIS — I10 Essential (primary) hypertension: Secondary | ICD-10-CM | POA: Diagnosis present

## 2021-05-30 DIAGNOSIS — I480 Paroxysmal atrial fibrillation: Secondary | ICD-10-CM | POA: Diagnosis not present

## 2021-05-30 DIAGNOSIS — N179 Acute kidney failure, unspecified: Secondary | ICD-10-CM | POA: Diagnosis not present

## 2021-05-30 DIAGNOSIS — I272 Pulmonary hypertension, unspecified: Secondary | ICD-10-CM | POA: Diagnosis present

## 2021-05-30 DIAGNOSIS — E039 Hypothyroidism, unspecified: Secondary | ICD-10-CM | POA: Diagnosis present

## 2021-05-30 DIAGNOSIS — Z882 Allergy status to sulfonamides status: Secondary | ICD-10-CM

## 2021-05-30 DIAGNOSIS — I82431 Acute embolism and thrombosis of right popliteal vein: Secondary | ICD-10-CM | POA: Diagnosis not present

## 2021-05-30 DIAGNOSIS — E43 Unspecified severe protein-calorie malnutrition: Secondary | ICD-10-CM | POA: Diagnosis present

## 2021-05-30 DIAGNOSIS — Z66 Do not resuscitate: Secondary | ICD-10-CM | POA: Diagnosis not present

## 2021-05-30 DIAGNOSIS — Z7189 Other specified counseling: Secondary | ICD-10-CM | POA: Diagnosis not present

## 2021-05-30 DIAGNOSIS — Z86718 Personal history of other venous thrombosis and embolism: Secondary | ICD-10-CM

## 2021-05-30 DIAGNOSIS — Z9049 Acquired absence of other specified parts of digestive tract: Secondary | ICD-10-CM

## 2021-05-30 DIAGNOSIS — I5032 Chronic diastolic (congestive) heart failure: Secondary | ICD-10-CM | POA: Diagnosis present

## 2021-05-30 DIAGNOSIS — Z888 Allergy status to other drugs, medicaments and biological substances status: Secondary | ICD-10-CM

## 2021-05-30 DIAGNOSIS — D72829 Elevated white blood cell count, unspecified: Secondary | ICD-10-CM | POA: Diagnosis present

## 2021-05-30 DIAGNOSIS — D509 Iron deficiency anemia, unspecified: Secondary | ICD-10-CM | POA: Diagnosis not present

## 2021-05-30 DIAGNOSIS — J929 Pleural plaque without asbestos: Secondary | ICD-10-CM | POA: Diagnosis not present

## 2021-05-30 DIAGNOSIS — Z8249 Family history of ischemic heart disease and other diseases of the circulatory system: Secondary | ICD-10-CM

## 2021-05-30 DIAGNOSIS — J9621 Acute and chronic respiratory failure with hypoxia: Secondary | ICD-10-CM | POA: Diagnosis present

## 2021-05-30 DIAGNOSIS — D62 Acute posthemorrhagic anemia: Secondary | ICD-10-CM | POA: Diagnosis present

## 2021-05-30 DIAGNOSIS — M349 Systemic sclerosis, unspecified: Secondary | ICD-10-CM | POA: Diagnosis present

## 2021-05-30 DIAGNOSIS — R0609 Other forms of dyspnea: Secondary | ICD-10-CM | POA: Diagnosis not present

## 2021-05-30 DIAGNOSIS — I700 Atherosclerosis of aorta: Secondary | ICD-10-CM | POA: Diagnosis present

## 2021-05-30 DIAGNOSIS — I517 Cardiomegaly: Secondary | ICD-10-CM | POA: Diagnosis not present

## 2021-05-30 DIAGNOSIS — E1122 Type 2 diabetes mellitus with diabetic chronic kidney disease: Secondary | ICD-10-CM | POA: Diagnosis present

## 2021-05-30 DIAGNOSIS — Z681 Body mass index (BMI) 19 or less, adult: Secondary | ICD-10-CM | POA: Diagnosis not present

## 2021-05-30 DIAGNOSIS — R0600 Dyspnea, unspecified: Secondary | ICD-10-CM | POA: Diagnosis present

## 2021-05-30 DIAGNOSIS — E876 Hypokalemia: Secondary | ICD-10-CM | POA: Diagnosis not present

## 2021-05-30 DIAGNOSIS — D631 Anemia in chronic kidney disease: Secondary | ICD-10-CM | POA: Diagnosis present

## 2021-05-30 DIAGNOSIS — N1832 Chronic kidney disease, stage 3b: Secondary | ICD-10-CM | POA: Diagnosis present

## 2021-05-30 DIAGNOSIS — D649 Anemia, unspecified: Secondary | ICD-10-CM | POA: Diagnosis not present

## 2021-05-30 DIAGNOSIS — Z7901 Long term (current) use of anticoagulants: Secondary | ICD-10-CM

## 2021-05-30 DIAGNOSIS — Z79899 Other long term (current) drug therapy: Secondary | ICD-10-CM

## 2021-05-30 DIAGNOSIS — M810 Age-related osteoporosis without current pathological fracture: Secondary | ICD-10-CM | POA: Diagnosis present

## 2021-05-30 DIAGNOSIS — Z20822 Contact with and (suspected) exposure to covid-19: Secondary | ICD-10-CM | POA: Diagnosis present

## 2021-05-30 DIAGNOSIS — R609 Edema, unspecified: Secondary | ICD-10-CM | POA: Diagnosis not present

## 2021-05-30 DIAGNOSIS — R7401 Elevation of levels of liver transaminase levels: Secondary | ICD-10-CM | POA: Diagnosis present

## 2021-05-30 DIAGNOSIS — R64 Cachexia: Secondary | ICD-10-CM | POA: Diagnosis present

## 2021-05-30 DIAGNOSIS — Z79890 Hormone replacement therapy (postmenopausal): Secondary | ICD-10-CM

## 2021-05-30 DIAGNOSIS — Z515 Encounter for palliative care: Secondary | ICD-10-CM | POA: Diagnosis not present

## 2021-05-30 DIAGNOSIS — R0902 Hypoxemia: Secondary | ICD-10-CM | POA: Diagnosis not present

## 2021-05-30 DIAGNOSIS — K921 Melena: Principal | ICD-10-CM | POA: Diagnosis present

## 2021-05-30 DIAGNOSIS — I82409 Acute embolism and thrombosis of unspecified deep veins of unspecified lower extremity: Secondary | ICD-10-CM | POA: Diagnosis not present

## 2021-05-30 DIAGNOSIS — R0602 Shortness of breath: Secondary | ICD-10-CM | POA: Diagnosis not present

## 2021-05-30 DIAGNOSIS — Z789 Other specified health status: Secondary | ICD-10-CM | POA: Diagnosis not present

## 2021-05-30 DIAGNOSIS — K2100 Gastro-esophageal reflux disease with esophagitis, without bleeding: Secondary | ICD-10-CM | POA: Diagnosis present

## 2021-05-30 DIAGNOSIS — M25512 Pain in left shoulder: Secondary | ICD-10-CM | POA: Diagnosis not present

## 2021-05-30 DIAGNOSIS — E785 Hyperlipidemia, unspecified: Secondary | ICD-10-CM | POA: Diagnosis present

## 2021-05-30 DIAGNOSIS — I2729 Other secondary pulmonary hypertension: Secondary | ICD-10-CM | POA: Diagnosis not present

## 2021-05-30 DIAGNOSIS — Z9981 Dependence on supplemental oxygen: Secondary | ICD-10-CM

## 2021-05-30 DIAGNOSIS — M340 Progressive systemic sclerosis: Secondary | ICD-10-CM | POA: Diagnosis not present

## 2021-05-30 DIAGNOSIS — L89311 Pressure ulcer of right buttock, stage 1: Secondary | ICD-10-CM | POA: Diagnosis present

## 2021-05-30 DIAGNOSIS — I2721 Secondary pulmonary arterial hypertension: Secondary | ICD-10-CM | POA: Diagnosis present

## 2021-05-30 DIAGNOSIS — R918 Other nonspecific abnormal finding of lung field: Secondary | ICD-10-CM | POA: Diagnosis present

## 2021-05-30 DIAGNOSIS — Z87891 Personal history of nicotine dependence: Secondary | ICD-10-CM

## 2021-05-30 DIAGNOSIS — N184 Chronic kidney disease, stage 4 (severe): Secondary | ICD-10-CM | POA: Diagnosis present

## 2021-05-30 DIAGNOSIS — I7300 Raynaud's syndrome without gangrene: Secondary | ICD-10-CM | POA: Diagnosis present

## 2021-05-30 DIAGNOSIS — Z7952 Long term (current) use of systemic steroids: Secondary | ICD-10-CM

## 2021-05-30 DIAGNOSIS — N189 Chronic kidney disease, unspecified: Secondary | ICD-10-CM | POA: Diagnosis not present

## 2021-05-30 DIAGNOSIS — I130 Hypertensive heart and chronic kidney disease with heart failure and stage 1 through stage 4 chronic kidney disease, or unspecified chronic kidney disease: Secondary | ICD-10-CM | POA: Diagnosis present

## 2021-05-30 DIAGNOSIS — Z885 Allergy status to narcotic agent status: Secondary | ICD-10-CM

## 2021-05-30 DIAGNOSIS — I129 Hypertensive chronic kidney disease with stage 1 through stage 4 chronic kidney disease, or unspecified chronic kidney disease: Secondary | ICD-10-CM | POA: Diagnosis not present

## 2021-05-30 DIAGNOSIS — R195 Other fecal abnormalities: Secondary | ICD-10-CM | POA: Diagnosis not present

## 2021-05-30 DIAGNOSIS — R000 Tachycardia, unspecified: Secondary | ICD-10-CM | POA: Diagnosis not present

## 2021-05-30 DIAGNOSIS — I2510 Atherosclerotic heart disease of native coronary artery without angina pectoris: Secondary | ICD-10-CM | POA: Diagnosis not present

## 2021-05-30 DIAGNOSIS — J811 Chronic pulmonary edema: Secondary | ICD-10-CM | POA: Diagnosis not present

## 2021-05-30 DIAGNOSIS — L8990 Pressure ulcer of unspecified site, unspecified stage: Secondary | ICD-10-CM | POA: Insufficient documentation

## 2021-05-30 DIAGNOSIS — Z886 Allergy status to analgesic agent status: Secondary | ICD-10-CM

## 2021-05-30 DIAGNOSIS — K922 Gastrointestinal hemorrhage, unspecified: Secondary | ICD-10-CM | POA: Diagnosis not present

## 2021-05-30 LAB — CBC WITH DIFFERENTIAL/PLATELET
Abs Immature Granulocytes: 0.05 10*3/uL (ref 0.00–0.07)
Basophils Absolute: 0 10*3/uL (ref 0.0–0.1)
Basophils Relative: 0 %
Eosinophils Absolute: 0.1 10*3/uL (ref 0.0–0.5)
Eosinophils Relative: 1 %
HCT: 23.8 % — ABNORMAL LOW (ref 36.0–46.0)
Hemoglobin: 7.4 g/dL — ABNORMAL LOW (ref 12.0–15.0)
Immature Granulocytes: 0 %
Lymphocytes Relative: 5 %
Lymphs Abs: 0.5 10*3/uL — ABNORMAL LOW (ref 0.7–4.0)
MCH: 30.1 pg (ref 26.0–34.0)
MCHC: 31.1 g/dL (ref 30.0–36.0)
MCV: 96.7 fL (ref 80.0–100.0)
Monocytes Absolute: 0.8 10*3/uL (ref 0.1–1.0)
Monocytes Relative: 7 %
Neutro Abs: 9.8 10*3/uL — ABNORMAL HIGH (ref 1.7–7.7)
Neutrophils Relative %: 87 %
Platelets: 445 10*3/uL — ABNORMAL HIGH (ref 150–400)
RBC: 2.46 MIL/uL — ABNORMAL LOW (ref 3.87–5.11)
RDW: 14.6 % (ref 11.5–15.5)
WBC: 11.2 10*3/uL — ABNORMAL HIGH (ref 4.0–10.5)
nRBC: 0.2 % (ref 0.0–0.2)

## 2021-05-30 LAB — COMPREHENSIVE METABOLIC PANEL
ALT: 26 U/L (ref 0–44)
AST: 28 U/L (ref 15–41)
Albumin: 3 g/dL — ABNORMAL LOW (ref 3.5–5.0)
Alkaline Phosphatase: 62 U/L (ref 38–126)
Anion gap: 13 (ref 5–15)
BUN: 48 mg/dL — ABNORMAL HIGH (ref 8–23)
CO2: 23 mmol/L (ref 22–32)
Calcium: 8.1 mg/dL — ABNORMAL LOW (ref 8.9–10.3)
Chloride: 107 mmol/L (ref 98–111)
Creatinine, Ser: 1.65 mg/dL — ABNORMAL HIGH (ref 0.44–1.00)
GFR, Estimated: 31 mL/min — ABNORMAL LOW (ref >60)
Glucose, Bld: 184 mg/dL — ABNORMAL HIGH (ref 70–99)
Potassium: 3.9 mmol/L (ref 3.5–5.1)
Sodium: 143 mmol/L (ref 135–145)
Total Bilirubin: 0.3 mg/dL (ref 0.3–1.2)
Total Protein: 6.5 g/dL (ref 6.5–8.1)

## 2021-05-30 LAB — I-STAT CHEM 8, ED
BUN: 46 mg/dL — ABNORMAL HIGH (ref 8–23)
Calcium, Ion: 0.99 mmol/L — ABNORMAL LOW (ref 1.15–1.40)
Chloride: 107 mmol/L (ref 98–111)
Creatinine, Ser: 1.5 mg/dL — ABNORMAL HIGH (ref 0.44–1.00)
Glucose, Bld: 128 mg/dL — ABNORMAL HIGH (ref 70–99)
HCT: 22 % — ABNORMAL LOW (ref 36.0–46.0)
Hemoglobin: 7.5 g/dL — ABNORMAL LOW (ref 12.0–15.0)
Potassium: 3.6 mmol/L (ref 3.5–5.1)
Sodium: 142 mmol/L (ref 135–145)
TCO2: 25 mmol/L (ref 22–32)

## 2021-05-30 LAB — BRAIN NATRIURETIC PEPTIDE: B Natriuretic Peptide: 562 pg/mL — ABNORMAL HIGH (ref 0.0–100.0)

## 2021-05-30 LAB — PREPARE RBC (CROSSMATCH)

## 2021-05-30 LAB — POC OCCULT BLOOD, ED: Fecal Occult Bld: POSITIVE — AB

## 2021-05-30 LAB — RESP PANEL BY RT-PCR (FLU A&B, COVID) ARPGX2
Influenza A by PCR: NEGATIVE
Influenza B by PCR: NEGATIVE
SARS Coronavirus 2 by RT PCR: NEGATIVE

## 2021-05-30 LAB — SAMPLE TO BLOOD BANK

## 2021-05-30 LAB — ABO/RH: ABO/RH(D): A POS

## 2021-05-30 MED ORDER — SODIUM CHLORIDE 0.9 % IV SOLN
10.0000 mL/h | Freq: Once | INTRAVENOUS | Status: AC
  Administered 2021-05-30: 10 mL/h via INTRAVENOUS

## 2021-05-30 NOTE — ED Provider Notes (Addendum)
[facility] EMERGENCY DEPARTMENT Provider Note   CSN: [id number] Arrival date & time: 05/30/21  1844     History  Chief Complaint  Patient presents with   Abnormal Lab    Meghan Wise is a 86 y.o. female with a past medical history significant for CHF, acute on chronic respiratory failure, CKD, scleroderma, Raynaud's, anemia of chronic disease, pulmonary hypertension who presents from her pulmonary hypertension clinic interim with concern for symptomatic anemia, measured in their office earlier today at 6.8.  Patient reports that she has maybe had some slightly dark stools but she does have some occasional rectal discharge associated with her scleroderma.  She reports that she has had some generalized fatigue for the last 2 weeks.  She was treated for a nonspecific "infection" of unknown origin with prednisone burst as well as azithromycin around 2 weeks ago with no improvement of her symptoms.  She is on Eliquis due to history of a chronic DVT, and has been taking this medication as prescribed.  She reports some worsening of her baseline dyspnea with increase of her home oxygen to 4 L baseline, 5 L with exertion.  In her arrival notes her pulmonologist also had concern for worsening heart strain, pulmonary hypertension symptoms today.   Abnormal Lab     Home Medications Prior to Admission medications   Medication Sig Start Date End Date Taking? Authorizing Provider  Acetaminophen 500 MG capsule Take 1,000 mg by mouth every 6 (six) hours as needed for fever.   Yes [provider]  ambrisentan (LETAIRIS) 10 MG tablet Take 10 mg by mouth daily.    Yes [provider]  amLODipine (NORVASC) 5 MG tablet Take by mouth. 04/22/21  Yes [provider]  apixaban (ELIQUIS) 2.5 MG TABS tablet Take 1 tablet (2.5 mg total) by mouth 2 (two) times daily. 05/12/21  Yes Mannam, Praveen, MD  atorvastatin (LIPITOR) 20 MG tablet Take 20 mg by mouth daily.   Yes  [provider]  Cyanocobalamin (VITAMIN B-12 ER PO) Take 1 tablet by mouth daily.   Yes [provider]  denosumab (PROLIA) 60 MG/ML SOSY injection Inject 60 mg into the skin every 6 (six) months.   Yes [provider]  folic acid (FOLVITE) 594 MCG tablet Take 400 mcg by mouth every evening.    Yes [provider]  levothyroxine (SYNTHROID, LEVOTHROID) 50 MCG tablet Take 50 mcg by mouth every morning.   Yes [provider]  loperamide (IMODIUM A-D) 2 MG tablet Take 4 mg by mouth 4 (four) times daily as needed for diarrhea or loose stools.    Yes [provider]  Melatonin 10 MG TABS Take 10 mg by mouth daily as needed (sleep).    Yes [provider]  metroNIDAZOLE (FLAGYL) 500 MG tablet Take 500 mg by mouth 2 (two) times daily.   Yes [provider]  omeprazole (PRILOSEC) 20 MG capsule Take 20 mg by mouth daily.   Yes [provider]  OXYGEN Inhale 2-4 L into the lungs continuous. 4 L with exertion 2 L at night   Yes [provider]  predniSONE (DELTASONE) 5 MG tablet Take 5 mg by mouth daily with breakfast.   Yes [provider]  tadalafil (ADCIRCA/CIALIS) 20 MG tablet Take 40 mg by mouth every evening.    Yes [provider]  torsemide (DEMADEX) 5 MG tablet Take 1 tablet (5 mg total) by mouth daily. Patient taking differently: Take 10 mg  by mouth daily. 03/23/21  Yes Mannam, Praveen, MD  ferrous sulfate 324 MG TBEC Take 324 mg by mouth daily. Patient not taking: Reported on 05/30/2021    [provider]      Allergies    Losartan, Nsaids, Ace inhibitors, Sulfa antibiotics, Codeine, and Heparin    Review of Systems   Review of Systems  Constitutional:  Positive for fatigue.  Respiratory:  Positive for shortness of breath.   All other systems reviewed and are negative.  Physical Exam Updated Vital Signs BP 136/71    Pulse 82    Temp 99 F (37.2 C) (Oral)    Resp (!) 25     Ht _0  (1.473 m)    Wt 40 kg    SpO2 100%    BMI 18.43 kg/m  Physical Exam Vitals and nursing note reviewed.  Constitutional:      General: She is not in acute distress.    Appearance: Normal appearance.     Comments: This is a thin chronically ill-appearing woman who appears in no acute distress  HENT:     Head: Normocephalic and atraumatic.  Eyes:     General:        Right eye: No discharge.        Left eye: No discharge.  Cardiovascular:     Rate and Rhythm: Normal rate and regular rhythm.     Heart sounds: No murmur heard.   No friction rub. No gallop.  Pulmonary:     Effort: Pulmonary effort is normal.     Breath sounds: Normal breath sounds.  Abdominal:     General: Bowel sounds are normal.     Palpations: Abdomen is soft.  Genitourinary:    Comments: No evidence of external hemorrhoids, none melanotic gross appearance of stool, stool is brownish-gray in color Skin:    General: Skin is warm and dry.     Capillary Refill: Capillary refill takes less than 2 seconds.  Neurological:     Mental Status: She is alert and oriented to person, place, and time.  Psychiatric:        Mood and Affect: Mood normal.        Behavior: Behavior normal.    ED Results / Procedures / Treatments   Labs (all labs ordered are listed, but only abnormal results are displayed) Labs Reviewed  COMPREHENSIVE METABOLIC PANEL - Abnormal; Notable for the following components:      Result Value   Glucose, Bld 184 (*)    BUN 48 (*)    Creatinine, Ser 1.65 (*)    Calcium 8.1 (*)    Albumin 3.0 (*)    GFR, Estimated 31 (*)    All other components within normal limits  CBC WITH DIFFERENTIAL/PLATELET - Abnormal; Notable for the following components:   WBC 11.2 (*)    RBC 2.46 (*)    Hemoglobin 7.4 (*)    HCT 23.8 (*)    Platelets 445 (*)    Neutro Abs 9.8 (*)    Lymphs Abs 0.5 (*)    All other components within normal limits  BRAIN NATRIURETIC PEPTIDE - Abnormal; Notable for the following  components:   B Natriuretic Peptide 562.0 (*)    All other components within normal limits  POC OCCULT BLOOD, ED - Abnormal; Notable for the following components:   Fecal Occult Bld POSITIVE (*)    All other components within normal limits  I-STAT CHEM 8, ED - Abnormal; Notable for the following  components:   BUN 46 (*)    Creatinine, Ser 1.50 (*)    Glucose, Bld 128 (*)    Calcium, Ion 0.99 (*)    Hemoglobin 7.5 (*)    HCT 22.0 (*)    All other components within normal limits  RESP PANEL BY RT-PCR (FLU A&B, COVID) ARPGX2  SAMPLE TO BLOOD BANK  PREPARE RBC (CROSSMATCH)  TYPE AND SCREEN  ABO/RH    EKG None  Radiology DG Chest 2 View  Result Date: 05/30/2021 CLINICAL DATA:  Shortness of breath. EXAM: CHEST - 2 VIEW COMPARISON:  Chest x-ray 05/20/2021.  Chest CT 12/24/2020. FINDINGS: Scarring is again seen in the bilateral lung apices. There is a stable calcified granuloma in the left lung apex. There is no new focal lung infiltrate, pleural effusion or pneumothorax identified. Cardiomediastinal silhouette is within normal limits. No acute fractures are seen. IMPRESSION: No evidence for pneumonia or edema. Stable chronic changes in the lung apices. Electronically Signed   By: Ronney Asters M.D.   On: 05/30/2021 21:31    Procedures .Critical Care E&M Performed by: Anselmo Pickler, PA-C  Critical care provider statement:    Critical care time (minutes):  32   Critical care time was exclusive of:  Separately billable procedures and treating other patients   Critical care was necessary to treat or prevent imminent or life-threatening deterioration of the following conditions: symptomatic anemia reuqiring transfusion.   Critical care was time spent personally by me on the following activities:  Ordering and review of laboratory studies, discussions with consultants, ordering and review of radiographic studies, pulse oximetry, re-evaluation of patient's condition, evaluation of  patient's response to treatment, examination of patient, review of old charts and interpretation of cardiac output measurements   Care discussed with: admitting provider   After initial E/M assessment, critical care services were subsequently performed that were exclusive of separately billable procedures or treatment.      Medications Ordered in ED Medications  0.9 %  sodium chloride infusion (has no administration in time range)    ED Course/ Medical Decision Making/ A&P Clinical Course as of 05/31/21 0016  Mon May 30, 2021  2155 We will obtain Hemoccult, and consult for admission [CP]  2328 Dr. Marlowe Sax to admit  [CP]    Clinical Course User Index [CP] Anselmo Pickler, PA-C                           Medical Decision Making Amount and/or Complexity of Data Reviewed Labs: ordered. Radiology: ordered.  Risk Prescription drug management. Decision regarding hospitalization.   I discussed this case with my attending physician who cosigned this note including patient's presenting symptoms, physical exam, and planned diagnostics and interventions. Attending physician stated agreement with plan or made changes to plan which were implemented.   This patient presents to the ED for concern of symptomatic anemia, acutely worsening over the last 3 months, this involves an extensive number of treatment options, and is a complaint that carries with it a high risk of complications and morbidity. The emergent differential diagnosis includes, but is not limited to, acute GI bleed versus worsening anemia of chronic disease.   Co morbidities that complicate the patient evaluation: Chronic kidney disease, scleroderma, pulmonary hypertension, interstitial lung disease Social Determinants of Health: Patient with good medical compliance, good family support, good access to care  Additional history obtained from patient's daughter. External records from outside source obtained and reviewed  including , recent office visits with pulmonary hypertension specialist, as well as notes that arrived with patient from pulmonary hypertension Dr.  Physical Exam: Physical exam performed. The pertinent findings include: Thin, chronically ill-appearing patient who is in no acute distress who is maintaining good oxygen saturation on 4 L nasal cannula.  Lab Tests: I Ordered, and personally interpreted labs.  The pertinent results include: CMP significant for mildly elevated blood glucose at 184, elevated kidney function with BUN and creatinine similar or better compared to baseline.  Decreased albumin consistent with patient appearance of overall chronic disease and protein calorie malnutrition.  Her CBC shows slightly elevated hemoglobin compared to measurement taken by different office earlier, hemoglobin 7.4 here.  White blood cell slightly elevated 11.2.  Platelets elevated at 445.    Imaging Studies: I ordered imaging studies including chest x-ray. I independently visualized and interpreted imaging which showed chronic changes at the base of the lungs, no evidence of new infiltrate or other abnormality. I agree with the radiologist interpretation.  Consultations Obtained: I requested consultation with the GI team for management of possible subacute GI bleed, and management of anticoagulation in setting of new symptomatic anemia,  and discussed lab and imaging findings as well as pertinent plan -we are pending their recommendations at this time.  I also consulted with Dr. Marlowe Sax with the hospitalist team who agrees to admission of the patient at this time, but requests additional GI consult.   Disposition: After consideration of the diagnostic results and the patients response to treatment, I feel that patient would benefit from hospitalization for further evaluation, management of symptomatic anemia, and evaluation of possible worsening lung disease, right heart strain.   Final Clinical  Impression(s) / ED Diagnoses Final diagnoses:  Symptomatic anemia    Rx / DC Orders ED Discharge Orders     None         Anselmo Pickler, PA-C 05/30/21 2359    Anselmo Pickler, PA-C 05/31/21 0016    Dorie Rank, MD 05/31/21 2356

## 2021-05-30 NOTE — ED Triage Notes (Signed)
Pt arrives POV for eval of a low hemoglobin and worsening malaise. Pt was seeing her pulm hypertenison MD today and noted to have a drop in her HGB from 8-->6. Pt reports she was treated for an indeterminate infection about 2 weeks ago w/ a Zpack and prednisone but never felt better since that time. Mildly tachypneic in triage. Initial pulse ox charted at 65% but pt has severe reynauds and fingers not picking up- pulse ox on ear 100% on baseline 4LPM

## 2021-05-31 ENCOUNTER — Inpatient Hospital Stay (HOSPITAL_COMMUNITY): Payer: Medicare PPO

## 2021-05-31 ENCOUNTER — Observation Stay (HOSPITAL_COMMUNITY): Payer: Medicare PPO

## 2021-05-31 DIAGNOSIS — R609 Edema, unspecified: Secondary | ICD-10-CM | POA: Diagnosis not present

## 2021-05-31 DIAGNOSIS — K921 Melena: Secondary | ICD-10-CM | POA: Diagnosis present

## 2021-05-31 DIAGNOSIS — I272 Pulmonary hypertension, unspecified: Secondary | ICD-10-CM

## 2021-05-31 DIAGNOSIS — Z66 Do not resuscitate: Secondary | ICD-10-CM

## 2021-05-31 DIAGNOSIS — M25512 Pain in left shoulder: Secondary | ICD-10-CM

## 2021-05-31 DIAGNOSIS — M349 Systemic sclerosis, unspecified: Secondary | ICD-10-CM

## 2021-05-31 DIAGNOSIS — Z20822 Contact with and (suspected) exposure to covid-19: Secondary | ICD-10-CM | POA: Diagnosis present

## 2021-05-31 DIAGNOSIS — M810 Age-related osteoporosis without current pathological fracture: Secondary | ICD-10-CM | POA: Diagnosis present

## 2021-05-31 DIAGNOSIS — Z86718 Personal history of other venous thrombosis and embolism: Secondary | ICD-10-CM

## 2021-05-31 DIAGNOSIS — R0609 Other forms of dyspnea: Secondary | ICD-10-CM

## 2021-05-31 DIAGNOSIS — I130 Hypertensive heart and chronic kidney disease with heart failure and stage 1 through stage 4 chronic kidney disease, or unspecified chronic kidney disease: Secondary | ICD-10-CM | POA: Diagnosis present

## 2021-05-31 DIAGNOSIS — E039 Hypothyroidism, unspecified: Secondary | ICD-10-CM | POA: Diagnosis present

## 2021-05-31 DIAGNOSIS — E1122 Type 2 diabetes mellitus with diabetic chronic kidney disease: Secondary | ICD-10-CM | POA: Diagnosis present

## 2021-05-31 DIAGNOSIS — N1832 Chronic kidney disease, stage 3b: Secondary | ICD-10-CM | POA: Diagnosis present

## 2021-05-31 DIAGNOSIS — I82431 Acute embolism and thrombosis of right popliteal vein: Secondary | ICD-10-CM | POA: Diagnosis not present

## 2021-05-31 DIAGNOSIS — I700 Atherosclerosis of aorta: Secondary | ICD-10-CM | POA: Diagnosis present

## 2021-05-31 DIAGNOSIS — N179 Acute kidney failure, unspecified: Secondary | ICD-10-CM | POA: Diagnosis not present

## 2021-05-31 DIAGNOSIS — D649 Anemia, unspecified: Secondary | ICD-10-CM

## 2021-05-31 DIAGNOSIS — Z681 Body mass index (BMI) 19 or less, adult: Secondary | ICD-10-CM | POA: Diagnosis not present

## 2021-05-31 DIAGNOSIS — R0602 Shortness of breath: Secondary | ICD-10-CM

## 2021-05-31 DIAGNOSIS — R918 Other nonspecific abnormal finding of lung field: Secondary | ICD-10-CM | POA: Diagnosis present

## 2021-05-31 DIAGNOSIS — J9621 Acute and chronic respiratory failure with hypoxia: Secondary | ICD-10-CM | POA: Diagnosis present

## 2021-05-31 DIAGNOSIS — N184 Chronic kidney disease, stage 4 (severe): Secondary | ICD-10-CM

## 2021-05-31 DIAGNOSIS — I2721 Secondary pulmonary arterial hypertension: Secondary | ICD-10-CM | POA: Diagnosis present

## 2021-05-31 DIAGNOSIS — I5032 Chronic diastolic (congestive) heart failure: Secondary | ICD-10-CM | POA: Diagnosis present

## 2021-05-31 DIAGNOSIS — Z515 Encounter for palliative care: Secondary | ICD-10-CM

## 2021-05-31 DIAGNOSIS — R64 Cachexia: Secondary | ICD-10-CM | POA: Diagnosis present

## 2021-05-31 DIAGNOSIS — Z789 Other specified health status: Secondary | ICD-10-CM

## 2021-05-31 DIAGNOSIS — D62 Acute posthemorrhagic anemia: Secondary | ICD-10-CM | POA: Diagnosis present

## 2021-05-31 DIAGNOSIS — R0600 Dyspnea, unspecified: Secondary | ICD-10-CM | POA: Diagnosis present

## 2021-05-31 DIAGNOSIS — I480 Paroxysmal atrial fibrillation: Secondary | ICD-10-CM | POA: Diagnosis not present

## 2021-05-31 DIAGNOSIS — Z7189 Other specified counseling: Secondary | ICD-10-CM

## 2021-05-31 DIAGNOSIS — D631 Anemia in chronic kidney disease: Secondary | ICD-10-CM | POA: Diagnosis present

## 2021-05-31 DIAGNOSIS — K2100 Gastro-esophageal reflux disease with esophagitis, without bleeding: Secondary | ICD-10-CM | POA: Diagnosis present

## 2021-05-31 DIAGNOSIS — E43 Unspecified severe protein-calorie malnutrition: Secondary | ICD-10-CM | POA: Diagnosis present

## 2021-05-31 DIAGNOSIS — D72829 Elevated white blood cell count, unspecified: Secondary | ICD-10-CM | POA: Diagnosis present

## 2021-05-31 DIAGNOSIS — L89311 Pressure ulcer of right buttock, stage 1: Secondary | ICD-10-CM | POA: Diagnosis present

## 2021-05-31 DIAGNOSIS — R590 Localized enlarged lymph nodes: Secondary | ICD-10-CM | POA: Diagnosis present

## 2021-05-31 LAB — BASIC METABOLIC PANEL
Anion gap: 11 (ref 5–15)
BUN: 46 mg/dL — ABNORMAL HIGH (ref 8–23)
CO2: 24 mmol/L (ref 22–32)
Calcium: 8 mg/dL — ABNORMAL LOW (ref 8.9–10.3)
Chloride: 110 mmol/L (ref 98–111)
Creatinine, Ser: 1.52 mg/dL — ABNORMAL HIGH (ref 0.44–1.00)
GFR, Estimated: 34 mL/min — ABNORMAL LOW (ref >60)
Glucose, Bld: 90 mg/dL (ref 70–99)
Potassium: 4.1 mmol/L (ref 3.5–5.1)
Sodium: 145 mmol/L (ref 135–145)

## 2021-05-31 LAB — CBC
HCT: 28.3 % — ABNORMAL LOW (ref 36.0–46.0)
HCT: 23.2 % — ABNORMAL LOW (ref 36.0–46.0)
HCT: 26.1 % — ABNORMAL LOW (ref 36.0–46.0)
Hemoglobin: 8.9 g/dL — ABNORMAL LOW (ref 12.0–15.0)
Hemoglobin: 7.4 g/dL — ABNORMAL LOW (ref 12.0–15.0)
Hemoglobin: 8.1 g/dL — ABNORMAL LOW (ref 12.0–15.0)
MCH: 30.5 pg (ref 26.0–34.0)
MCH: 30.5 pg (ref 26.0–34.0)
MCH: 29.5 pg (ref 26.0–34.0)
MCHC: 31.4 g/dL (ref 30.0–36.0)
MCHC: 31.9 g/dL (ref 30.0–36.0)
MCHC: 31 g/dL (ref 30.0–36.0)
MCV: 96.9 fL (ref 80.0–100.0)
MCV: 95.5 fL (ref 80.0–100.0)
MCV: 94.9 fL (ref 80.0–100.0)
Platelets: 412 10*3/uL — ABNORMAL HIGH (ref 150–400)
Platelets: 363 10*3/uL (ref 150–400)
Platelets: 382 10*3/uL (ref 150–400)
RBC: 2.92 MIL/uL — ABNORMAL LOW (ref 3.87–5.11)
RBC: 2.43 MIL/uL — ABNORMAL LOW (ref 3.87–5.11)
RBC: 2.75 MIL/uL — ABNORMAL LOW (ref 3.87–5.11)
RDW: 14.1 % (ref 11.5–15.5)
RDW: 14.1 % (ref 11.5–15.5)
RDW: 14.5 % (ref 11.5–15.5)
WBC: 10.5 10*3/uL (ref 4.0–10.5)
WBC: 12.6 10*3/uL — ABNORMAL HIGH (ref 4.0–10.5)
WBC: 12.5 10*3/uL — ABNORMAL HIGH (ref 4.0–10.5)
nRBC: 0.2 % (ref 0.0–0.2)
nRBC: 0 % (ref 0.0–0.2)
nRBC: 0 % (ref 0.0–0.2)

## 2021-05-31 LAB — BRAIN NATRIURETIC PEPTIDE: B Natriuretic Peptide: 785.4 pg/mL — ABNORMAL HIGH (ref 0.0–100.0)

## 2021-05-31 LAB — ECHOCARDIOGRAM LIMITED
Area-P 1/2: 3.53 cm2
Height: 58 in
P 1/2 time: 503 msec
S' Lateral: 2.18 cm
Weight: 1410.94 oz

## 2021-05-31 LAB — TROPONIN I (HIGH SENSITIVITY): Troponin I (High Sensitivity): 24 ng/L — ABNORMAL HIGH (ref <18)

## 2021-05-31 LAB — MAGNESIUM: Magnesium: 2.4 mg/dL (ref 1.7–2.4)

## 2021-05-31 MED ORDER — VITAMIN B-12 1000 MCG PO TABS
1000.0000 ug | ORAL_TABLET | Freq: Every day | ORAL | Status: DC
  Administered 2021-05-31 – 2021-06-05 (×6): 1000 ug via ORAL
  Filled 2021-05-31 (×7): qty 1

## 2021-05-31 MED ORDER — MELATONIN 5 MG PO TABS: 10.0000 mg | ORAL_TABLET | Freq: Every day | ORAL | Status: DC | PRN

## 2021-05-31 MED ORDER — MORPHINE SULFATE (PF) 2 MG/ML IV SOLN
1.0000 mg | INTRAVENOUS | Status: DC | PRN
Start: 2021-05-31 — End: 2021-06-05
  Administered 2021-05-31: 1 mg via INTRAVENOUS
  Filled 2021-05-31: qty 1

## 2021-05-31 MED ORDER — TECHNETIUM TO 99M ALBUMIN AGGREGATED
4.0000 | Freq: Once | INTRAVENOUS | Status: AC | PRN
  Administered 2021-05-31: 4 via INTRAVENOUS

## 2021-05-31 MED ORDER — AMBRISENTAN 5 MG PO TABS
10.0000 mg | ORAL_TABLET | Freq: Every day | ORAL | Status: DC
  Administered 2021-05-31 – 2021-06-05 (×6): 10 mg via ORAL
  Filled 2021-05-31 (×6): qty 2

## 2021-05-31 MED ORDER — ATORVASTATIN CALCIUM 10 MG PO TABS
20.0000 mg | ORAL_TABLET | Freq: Every day | ORAL | Status: DC
  Administered 2021-05-31 – 2021-06-05 (×6): 20 mg via ORAL
  Filled 2021-05-31 (×7): qty 2

## 2021-05-31 MED ORDER — TRAMADOL HCL 50 MG PO TABS: 50.0000 mg | ORAL_TABLET | Freq: Four times a day (QID) | ORAL | Status: DC | PRN

## 2021-05-31 MED ORDER — TADALAFIL 20 MG PO TABS
40.0000 mg | ORAL_TABLET | Freq: Every evening | ORAL | Status: DC
  Administered 2021-05-31 – 2021-06-04 (×5): 40 mg via ORAL
  Filled 2021-05-31 (×10): qty 2

## 2021-05-31 MED ORDER — LEVOTHYROXINE SODIUM 50 MCG PO TABS
50.0000 ug | ORAL_TABLET | Freq: Every day | ORAL | Status: DC
  Administered 2021-05-31 – 2021-06-05 (×6): 50 ug via ORAL
  Filled 2021-05-31 (×5): qty 1
  Filled 2021-05-31: qty 2

## 2021-05-31 MED ORDER — PANTOPRAZOLE SODIUM 40 MG IV SOLR
40.0000 mg | Freq: Two times a day (BID) | INTRAVENOUS | Status: DC
  Administered 2021-05-31 – 2021-06-03 (×8): 40 mg via INTRAVENOUS
  Filled 2021-05-31 (×9): qty 10

## 2021-05-31 MED ORDER — AMLODIPINE BESYLATE 5 MG PO TABS
5.0000 mg | ORAL_TABLET | Freq: Every day | ORAL | Status: DC
  Administered 2021-05-31: 5 mg via ORAL
  Filled 2021-05-31 (×2): qty 1

## 2021-05-31 MED ORDER — PANTOPRAZOLE SODIUM 40 MG PO TBEC: 40.0000 mg | DELAYED_RELEASE_TABLET | Freq: Every day | ORAL | Status: DC

## 2021-05-31 MED ORDER — TRAMADOL HCL 50 MG PO TABS
50.0000 mg | ORAL_TABLET | Freq: Two times a day (BID) | ORAL | Status: DC | PRN
  Administered 2021-05-31: 50 mg via ORAL
  Filled 2021-05-31: qty 1

## 2021-05-31 MED ORDER — PREDNISONE 10 MG PO TABS
5.0000 mg | ORAL_TABLET | Freq: Every day | ORAL | Status: DC
  Administered 2021-05-31 – 2021-06-05 (×6): 5 mg via ORAL
  Filled 2021-05-31 (×7): qty 1

## 2021-05-31 MED ORDER — FERROUS SULFATE 325 (65 FE) MG PO TABS
325.0000 mg | ORAL_TABLET | Freq: Every day | ORAL | Status: DC
  Administered 2021-05-31 – 2021-06-05 (×6): 325 mg via ORAL
  Filled 2021-05-31 (×7): qty 1

## 2021-05-31 MED ORDER — ACETAMINOPHEN 325 MG PO TABS
650.0000 mg | ORAL_TABLET | Freq: Four times a day (QID) | ORAL | Status: DC | PRN
  Administered 2021-05-31: 650 mg via ORAL
  Filled 2021-05-31: qty 2

## 2021-05-31 MED ORDER — TORSEMIDE 20 MG PO TABS
10.0000 mg | ORAL_TABLET | Freq: Every day | ORAL | Status: DC
  Administered 2021-05-31: 10 mg via ORAL
  Filled 2021-05-31 (×2): qty 1

## 2021-05-31 MED ORDER — TRAMADOL HCL 50 MG PO TABS
50.0000 mg | ORAL_TABLET | Freq: Three times a day (TID) | ORAL | Status: DC | PRN
  Administered 2021-05-31: 50 mg via ORAL
  Filled 2021-05-31: qty 1

## 2021-05-31 MED ORDER — FOLIC ACID 1 MG PO TABS
500.0000 ug | ORAL_TABLET | Freq: Every evening | ORAL | Status: DC
  Administered 2021-05-31 – 2021-06-04 (×5): 0.5 mg via ORAL
  Filled 2021-05-31 (×5): qty 1

## 2021-05-31 MED ORDER — ACETAMINOPHEN 650 MG RE SUPP: 650.0000 mg | Freq: Four times a day (QID) | RECTAL | Status: DC | PRN

## 2021-05-31 NOTE — Assessment & Plan Note (Signed)
Creatinine 1.6, stable.

## 2021-05-31 NOTE — ED Notes (Signed)
Pt requesting meds for pain. Secure chat sent to MD.

## 2021-05-31 NOTE — H&P (Signed)
History and Physical    Meghan Wise RJJ:[id number] DOB: 12-25-1935 DOA: 05/30/2021  DOS: the patient was seen and examined on 05/30/2021  PCP: Collene Leyden, MD   Patient coming from: Home  HPI: Meghan Wise is a 86 y.o. female with medical history significant of hypertension, hyperlipidemia, hypothyroidism, CKD stage IIIb, scleroderma, chronic diastolic CHF, pulmonary hypertension, chronic respiratory failure on 4 L oxygen, Admitted in November 2022 for dyspnea/chest pain with concern for PE.  D-dimer elevated, VQ scan with small nonsegmental area of decreased perfusion suspected within the left upper lobe, and Doppler showing age-indeterminate right popliteal vein DVT.  Discharged on Eliquis.  Seen at pulmonology clinic 4 days ago for worsening dyspnea and etiology was unclear. Concern for heart failure and worsening pulmonary hypertension.  Advised to increase dose of torsemide for 3 days and follow-up with pulmonary hypertension clinic at [facility].  Plan was to repeat high-resolution chest CT.  Seen at [facility] pulmonary hypertension clinic on 2/6 and labs revealed worsening anemia with hemoglobin 6.8.  She was sent to the ED for further evaluation.  In the ED, slightly tachypneic but vital signs otherwise stable.  Satting well on 4 L home oxygen.  Labs showing WBC 11.2.  Hemoglobin 7.4, was ranging between 9.1-10.2 in November 2022.  Creatinine 1.6, stable.  COVID and flu negative.  BNP 562.  No grossly bloody or melenic stool noted on rectal exam done done in the ED.  FOBT positive.  Chest x-ray not suggestive of pneumonia or pulmonary edema, stable chronic changes in the lung apices. 1 unit PRBCs given and GI consulted.  Patient reports worsening shortness of breath and cough for the past 3 weeks.  States she had a fever about 2 or 3 weeks ago when she was seen by her primary care physician and they did tests including chest x-ray and urinalysis which were normal.  She was placed on Z-Pak and  prednisone 20 mg daily but has continued to be symptomatic.  She was seen by her doctor at pulmonary hypertension clinic at [facility] yesterday and was told that her labs were showing worsening anemia.  She was also told that her pulmonary hypertension has become worse and that she would need right heart catheterization and also CT scan of her chest for evaluation of her lungs.  Patient reports intermittent episodes of dark stools for the past few weeks.  Denies any alcohol or NSAID use.  She uses 4 L home oxygen at rest and 5 L with exertion.  Review of Systems:  All systems reviewed and apart from history of presenting illness, are negative.  Past Medical History:  Diagnosis Date   Heart failure ([place])    HTN (hypertension)    Hyperlipidemia    Hypothyroidism    Kidney disease    Stage IV- Dr. Servando Salina -LOV 02-11-14   Osteoarthritis    Osteoporosis    Pulmonary hypertension (HCC)    Raynaud disease    reactive with cold and stress mostly fingers.   Scleroderma ([place])    lungs and kidneys   Shortness of breath dyspnea    with exertion-in Cardiac rehab program at [facility].    Past Surgical History:  Procedure Laterality Date   BREAST BIOPSY Bilateral    x    CARDIAC CATHETERIZATION     2'15 [facility]   CHOLECYSTECTOMY     CHOLECYSTECTOMY, LAPAROSCOPIC     COLONOSCOPY WITH PROPOFOL N/A 05/07/2014   Procedure: COLONOSCOPY WITH PROPOFOL;  Surgeon: Inda Castle, MD;  Location: [facility] ENDOSCOPY;  Service: Endoscopy;  Laterality: N/A;   ESOPHAGOGASTRODUODENOSCOPY (EGD) WITH PROPOFOL N/A 05/07/2014   Procedure: ESOPHAGOGASTRODUODENOSCOPY (EGD) WITH PROPOFOL;  Surgeon: Inda Castle, MD;  Location: [facility] ENDOSCOPY;  Service: Endoscopy;  Laterality: N/A;   HOT HEMOSTASIS N/A 05/07/2014   Procedure: HOT HEMOSTASIS (ARGON PLASMA COAGULATION/BICAP);  Surgeon: Inda Castle, MD;  Location: [facility];  Service: Endoscopy;  Laterality: N/A;  deuodenal bulb   SHOULDER ARTHROSCOPY W/ ROTATOR CUFF REPAIR Right       reports that she quit smoking about 34 years ago. Her smoking use included cigarettes. She has a 90.00 pack-year smoking history. She has never used smokeless tobacco. She reports current alcohol use. She reports that she does not use drugs.  Allergies  Allergen Reactions   Losartan Other (See Comments) and Cough    High calcium count and renal problems   Nsaids     Other reaction(s): Kidney Disorder   Ace Inhibitors Other (See Comments) and Cough    Dizziness and coughing    Sulfa Antibiotics Hives   Codeine Nausea Only   Heparin Anxiety and Other (See Comments)    Pt reports that they have a sensitivity towards heparin. Pt becomes depressed and anxious to the point of crying uncontrollably.     Family History  Problem Relation Age of Onset   Heart disease Mother    Heart disease Father    Cancer Other        husband--esophageal   Diabetes Son    Stroke Daughter    Breast cancer Neg Hx     Prior to Admission medications   Medication Sig Start Date End Date Taking? Authorizing Provider  Acetaminophen 500 MG capsule Take 1,000 mg by mouth every 6 (six) hours as needed for fever.   Yes [provider]  ambrisentan (LETAIRIS) 10 MG tablet Take 10 mg by mouth daily.    Yes [provider]  amLODipine (NORVASC) 5 MG tablet Take by mouth. 04/22/21  Yes [provider]  apixaban (ELIQUIS) 2.5 MG TABS tablet Take 1 tablet (2.5 mg total) by mouth 2 (two) times daily. 05/12/21  Yes Mannam, Praveen, MD  atorvastatin (LIPITOR) 20 MG tablet Take 20 mg by mouth daily.   Yes [provider]  Cyanocobalamin (VITAMIN B-12 ER PO) Take 1 tablet by mouth daily.   Yes [provider]  denosumab (PROLIA) 60 MG/ML SOSY injection Inject 60 mg into the skin every 6 (six) months.   Yes [provider]  folic acid (FOLVITE) 373 MCG tablet Take 400 mcg by mouth every evening.    Yes [provider]  levothyroxine (SYNTHROID, LEVOTHROID)  50 MCG tablet Take 50 mcg by mouth every morning.   Yes [provider]  loperamide (IMODIUM A-D) 2 MG tablet Take 4 mg by mouth 4 (four) times daily as needed for diarrhea or loose stools.    Yes [provider]  Melatonin 10 MG TABS Take 10 mg by mouth daily as needed (sleep).    Yes [provider]  metroNIDAZOLE (FLAGYL) 500 MG tablet Take 500 mg by mouth 2 (two) times daily.   Yes [provider]  omeprazole (PRILOSEC) 20 MG capsule Take 20 mg by mouth daily.   Yes [provider]  OXYGEN Inhale 2-4 L into the lungs continuous. 4 L with exertion 2 L at night   Yes [provider]  predniSONE (DELTASONE) 5 MG tablet Take 5 mg by mouth daily with breakfast.  Yes [provider]  tadalafil (ADCIRCA/CIALIS) 20 MG tablet Take 40 mg by mouth every evening.    Yes [provider]  torsemide (DEMADEX) 5 MG tablet Take 1 tablet (5 mg total) by mouth daily. Patient taking differently: Take 10 mg by mouth daily. 03/23/21  Yes Mannam, Praveen, MD  ferrous sulfate 324 MG TBEC Take 324 mg by mouth daily. Patient not taking: Reported on 05/30/2021    [provider]    Physical Exam: Vitals:   05/30/21 2345 05/31/21 0000 05/31/21 0002 05/31/21 0015  BP: (!) 153/59 (!) 150/59  (!) 142/62  Pulse: 77 74  76  Resp: 20 (!) 22  (!) 22  Temp:   98.8 F (37.1 C)   TempSrc:      SpO2: 100% 100%  100%  Weight:      Height:        Physical Exam Constitutional:      Appearance: She is ill-appearing.  HENT:     Head: Normocephalic and atraumatic.  Eyes:     Extraocular Movements: Extraocular movements intact.     Conjunctiva/sclera: Conjunctivae normal.  Cardiovascular:     Rate and Rhythm: Normal rate and regular rhythm.     Pulses: Normal pulses.  Pulmonary:     Effort: Pulmonary effort is normal. No respiratory distress.     Breath sounds: Normal breath sounds. No wheezing or rales.  Abdominal:     General: Bowel  sounds are normal. There is no distension.     Palpations: Abdomen is soft.     Tenderness: There is no abdominal tenderness.  Musculoskeletal:        General: No swelling or tenderness.     Cervical back: Normal range of motion and neck supple.  Skin:    General: Skin is warm and dry.  Neurological:     General: No focal deficit present.     Mental Status: She is alert and oriented to person, place, and time.     Labs on Admission: I have personally reviewed following labs and imaging studies  CBC: Recent Labs  Lab 05/30/21 1931 05/30/21 2213  WBC 11.2*  --   NEUTROABS 9.8*  --   HGB 7.4* 7.5*  HCT 23.8* 22.0*  MCV 96.7  --   PLT 445*  --    Basic Metabolic Panel: Recent Labs  Lab 05/30/21 1931 05/30/21 2213  NA 143 142  K 3.9 3.6  CL 107 107  CO2 23  --   GLUCOSE 184* 128*  BUN 48* 46*  CREATININE 1.65* 1.50*  CALCIUM 8.1*  --    GFR: Estimated Creatinine Clearance: 17.9 mL/min (A) (by C-G formula based on SCr of 1.5 mg/dL (H)). Liver Function Tests: Recent Labs  Lab 05/30/21 1931  AST 28  ALT 26  ALKPHOS 62  BILITOT 0.3  PROT 6.5  ALBUMIN 3.0*   No results for input(s): LIPASE, AMYLASE in the last 168 hours. No results for input(s): AMMONIA in the last 168 hours. Coagulation Profile: No results for input(s): INR, PROTIME in the last 168 hours. Cardiac Enzymes: No results for input(s): CKTOTAL, CKMB, CKMBINDEX, TROPONINI in the last 168 hours. BNP (last 3 results) No results for input(s): PROBNP in the last 8760 hours. HbA1C: No results for input(s): HGBA1C in the last 72 hours. CBG: No results for input(s): GLUCAP in the last 168 hours. Lipid Profile: No results for input(s): CHOL, HDL, LDLCALC, TRIG, CHOLHDL, LDLDIRECT in the last 72 hours. Thyroid Function Tests:  No results for input(s): TSH, T4TOTAL, FREET4, T3FREE, THYROIDAB in the last 72 hours. Anemia Panel: No results for input(s): VITAMINB12, FOLATE, FERRITIN, TIBC, IRON, RETICCTPCT in  the last 72 hours. Urine analysis:    Component Value Date/Time   COLORURINE YELLOW 03/14/2021 [place] 03/14/2021 1322   LABSPEC 1.013 03/14/2021 1322   PHURINE 5.0 03/14/2021 1322   GLUCOSEU NEGATIVE 03/14/2021 1322   HGBUR NEGATIVE 03/14/2021 1322   BILIRUBINUR NEGATIVE 03/14/2021 1322   KETONESUR NEGATIVE 03/14/2021 1322   PROTEINUR NEGATIVE 03/14/2021 1322   UROBILINOGEN 0.2 05/24/2012 1450   NITRITE NEGATIVE 03/14/2021 1322   LEUKOCYTESUR NEGATIVE 03/14/2021 1322    Radiological Exams on Admission: I have personally reviewed images DG Chest 2 View  Result Date: 05/30/2021 CLINICAL DATA:  Shortness of breath. EXAM: CHEST - 2 VIEW COMPARISON:  Chest x-ray 05/20/2021.  Chest CT 12/24/2020. FINDINGS: Scarring is again seen in the bilateral lung apices. There is a stable calcified granuloma in the left lung apex. There is no new focal lung infiltrate, pleural effusion or pneumothorax identified. Cardiomediastinal silhouette is within normal limits. No acute fractures are seen. IMPRESSION: No evidence for pneumonia or edema. Stable chronic changes in the lung apices. Electronically Signed   By: Ronney Asters M.D.   On: 05/30/2021 21:31    EKG: Pending at this time.  Assessment and Plan: * Dyspnea- (present on admission) Multifactorial in the setting of severe pulmonary hypertension, chronic diastolic CHF, and worsening anemia.  Recently seen by pulmonology and it was felt that she did not have interstitial lung disease but plan was to obtain high-resolution chest CT.  Seen at [facility] pulmonary hypertension clinic yesterday and was sent here for right heart catheterization given concern for worsening pulmonary hypertension.  Echo done in November 2022 showing EF 60 to 90%, grade 1 diastolic dysfunction, RV pressure overload/severely elevated pulmonary artery systolic pressure.  BNP elevated, however, no pulmonary edema on chest x-ray. She becomes dyspneic when speaking but  stable on 4 L home oxygen. -Chest high-resolution CT ordered.  Please consult cardiology in the morning regarding right heart catheterization.  Continue supplemental oxygen to keep oxygen saturation above 92%.  Symptomatic anemia- (present on admission) Acute blood loss anemia Patient is endorsing worsening dyspnea and intermittent episodes of melena over the past few weeks.  She is on Eliquis due to DVT/PE diagnosed in November 2022.  She has chronic anemia in the setting of CKD stage IIIb but hemoglobin now lower. Hemoglobin currently 7.4, was ranging between 9.1-10.2 in November 2022.  FOBT positive.  No grossly bloody or melenic stool noted on rectal exam.  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  Currently hemodynamically stable. -ED provider sent a message to Dr. Tarri Glenn from [facility] requesting consultation in the morning.  Hold Eliquis at this time.  Patient is receiving 1 unit PRBCs ordered in the ED, follow-up posttransfusion H&H.  Transfuse if hemoglobin less than 7.   Pulmonary hypertension ([place])- (present on admission) -Consult cardiology regarding right heart catheterization as mentioned above.  Continue tadalafil and ambrisertan.   Scleroderma ([place])- (present on admission) -Continue prednisone, outpatient rheumatology follow-up  Chronic diastolic CHF (congestive heart failure), NYHA class 4 ([place])- (present on admission) No pulmonary edema on imaging.  Stable on 4 L home oxygen. -Continue torsemide  Leukocytosis- (present on admission) Mild and likely reactive from steroid use.  Chest x-ray not suggestive of pneumonia.  Not endorsing any other infectious symptoms. -  Continue to monitor  History of venous thromboembolism -Hold Eliquis at this time given concern for acute GI bleed.  Chronic kidney disease, stage 3b ([place])- (present on admission) Creatinine 1.6, stable.  Hypothyroidism- (present on admission) -Continue  Synthroid  HTN (hypertension)- (present on admission) Stable. -Continue amlodipine   DVT prophylaxis:  Hold Eliquis given concern for acute GI bleed.  No SCDs given recent DVT. Code Status: DNR.  Discussed with the patient. Family Communication: No family available at this time. Consults called: GI Level of care: Telemetry bed Admission status: It is my clinical opinion that admission to INPATIENT is reasonable and necessary because of the expectation that this patient will require hospital care that crosses at least 2 midnights to treat this condition based on the medical complexity of the problems presented.  Given the aforementioned information, the predictability of an adverse outcome is felt to be significant.   Shela Leff, MD [facility] 05/31/2021, 3:19 AM

## 2021-05-31 NOTE — ED Notes (Signed)
Pt reports L shoulder pain radiating across back that began during blood transfusion last night. Tramadol given per [facility] not providing any relief. Secure chat sent to MD regarding same.

## 2021-05-31 NOTE — ED Notes (Signed)
Portable Xray at bedside.

## 2021-05-31 NOTE — ED Notes (Signed)
Palliative in to see patient.

## 2021-05-31 NOTE — Assessment & Plan Note (Signed)
No pulmonary edema on imaging.  Stable on 4 L home oxygen. -Continue torsemide

## 2021-05-31 NOTE — Progress Notes (Signed)
Pharmacy Consult for Pulmonary Hypertension Treatment   Indication - Continuation of prior to admission medication   Patient is 86 y.o.  with history of PAH on chronic Ambrisentan (Letairis) PTA and will be continued while hospitalized.   Continuing this medication order as an inpatient requires that monitoring parameters per REMS requirements must be met.  Chronic therapy is under the supervision of Rajagopal (MD name) who is enrolled in the REMS program and is being notified of continuation of therapy. A staff message in EPIC has been sent notifying the certified prescriber.  Per patient report has previously been educated on Hepatotoxicity . On admission pregnancy risk has been assessed and no monitoring required. Hepatic function has been evaluated. AST/ALT appropriate to continue medication at this time.  Hepatic Function Latest Ref Rng & Units 05/30/2021 03/12/2021 03/11/2021  Total Protein 6.5 - 8.1 g/dL 6.5 6.7 7.8  Albumin 3.5 - 5.0 g/dL 3.0(L) 3.6 4.2  AST 15 - 41 U/L 28 25 32  ALT 0 - 44 U/L _0 Alk Phosphatase 38 - 126 U/L 62 94 124  Total Bilirubin 0.3 - 1.2 mg/dL 0.3 0.7 0.6  Bilirubin, Direct 0.0 - 0.3 mg/dL - - -    If any question arise or pregnancy is identified during hospitalization, contact for bosentan and macitentan: [phone]; ambrisentan: [phone].  Thank for you allowing [place] to participate in the care of this patient.  Bertis Ruddy, PharmD Clinical Pharmacist ED Pharmacist Phone # [phone] 05/31/2021 8:28 AM

## 2021-05-31 NOTE — Progress Notes (Signed)
Brief Palliative Medicine Progress Note:  PMT consult received and chart reviewed. Full GOC completed with patient. Also provided updates and discussed GOC with her daughter/HCPOA/Linda - full note to follow:  Recommendations/Plan Continue current full scope medical treatment. Patient is open to all recommended tests/procedures at this time, including heart catheterization and additional blood transfusions if needed Continue DNR/DNI Scheduled in person follow up meeting with patient and daughter tomorrow 2/8 at 9:30a Daughter/Linda is very involved in patient's care and appreciates updates about patient's condition/plan of care PMT will continue to follow and support holistically  Thank you for allowing PMT to assist in the care of this patient.  Taquisha Phung M. Tamala Julian [facility] Palliative Medicine Team Team Phone: [phone] NO CHARGE

## 2021-05-31 NOTE — ED Notes (Signed)
Pt transported to [facility] 

## 2021-05-31 NOTE — Assessment & Plan Note (Addendum)
Multifactorial in the setting of severe pulmonary hypertension, chronic diastolic CHF, and worsening anemia.  Recently seen by pulmonology and it was felt that she did not have interstitial lung disease but plan was to obtain high-resolution chest CT.  Seen at [facility] pulmonary hypertension clinic yesterday and was sent here for right heart catheterization given concern for worsening pulmonary hypertension.  Echo done in November 2022 showing EF 60 to 67%, grade 1 diastolic dysfunction, RV pressure overload/severely elevated pulmonary artery systolic pressure.  BNP elevated, however, no pulmonary edema on chest x-ray. She becomes dyspneic when speaking but stable on 4 L home oxygen. -Chest high-resolution CT ordered.  Please consult cardiology in the morning regarding right heart catheterization.  Continue supplemental oxygen to keep oxygen saturation above 92%.

## 2021-05-31 NOTE — Assessment & Plan Note (Signed)
-  Continue Synthroid °

## 2021-05-31 NOTE — Assessment & Plan Note (Addendum)
Acute blood loss anemia Patient is endorsing worsening dyspnea and intermittent episodes of melena over the past few weeks.  She is on Eliquis due to DVT/PE diagnosed in November 2022.  She has chronic anemia in the setting of CKD stage IIIb but hemoglobin now lower. Hemoglobin currently 7.4, was ranging between 9.1-10.2 in November 2022.  FOBT positive.  No grossly bloody or melenic stool noted on rectal exam.  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  Currently hemodynamically stable. -ED provider sent a message to Dr. Tarri Glenn from [facility] requesting consultation in the morning.  Hold Eliquis at this time.  Patient is receiving 1 unit PRBCs ordered in the ED, follow-up posttransfusion H&H.  Transfuse if hemoglobin less than 7.

## 2021-05-31 NOTE — Assessment & Plan Note (Addendum)
Mild and likely reactive from steroid use.  Chest x-ray not suggestive of pneumonia.  Not endorsing any other infectious symptoms. -Continue to monitor

## 2021-05-31 NOTE — Assessment & Plan Note (Addendum)
-  Continue prednisone, outpatient rheumatology follow-up

## 2021-05-31 NOTE — Progress Notes (Signed)
Echocardiogram 2D Echocardiogram has been performed.  Johny Chess 05/31/2021, 1:15 PM

## 2021-05-31 NOTE — Consult Note (Signed)
Reason for Consult: Anemia and Melena Referring Physician: [facility]  [name] HPI: This is an 86 year old female with a PMH of severe pulmonary HTN, Scleroderma, bacterial overgrowth, HTN, CKD, and CHF admitted for worsening SOB.  Recent work up and the ER work up showed that she was anemic and heme positive.  Recently she reported having some melenic stools.  She as in the office last week and she did not report any melena, but she was being evaluated for a worsening of her SOB.  In the ER her HGB was found to be at 7.4 g/dL and her baseline is around 9-10 g/dL.  An EGD was performed on 05/07/2014 by Dr. Deatra Ina for complaints of dysphagia.  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  Past Medical History:  Diagnosis Date   Heart failure ([place])    HTN (hypertension)    Hyperlipidemia    Hypothyroidism    Kidney disease    Stage IV- Dr. Servando Salina -LOV 02-11-14   Osteoarthritis    Osteoporosis    Pulmonary hypertension ([facility])    Raynaud disease    reactive with cold and stress mostly fingers.   Scleroderma ([place])    lungs and kidneys   Shortness of breath dyspnea    with exertion-in Cardiac rehab program at [facility].    Past Surgical History:  Procedure Laterality Date   BREAST BIOPSY Bilateral    x    CARDIAC CATHETERIZATION     2'15 [facility]   CHOLECYSTECTOMY     CHOLECYSTECTOMY, LAPAROSCOPIC     COLONOSCOPY WITH PROPOFOL N/A 05/07/2014   Procedure: COLONOSCOPY WITH PROPOFOL;  Surgeon: Inda Castle, MD;  Location: [facility] ENDOSCOPY;  Service: Endoscopy;  Laterality: N/A;   ESOPHAGOGASTRODUODENOSCOPY (EGD) WITH PROPOFOL N/A 05/07/2014   Procedure: ESOPHAGOGASTRODUODENOSCOPY (EGD) WITH PROPOFOL;  Surgeon: Inda Castle, MD;  Location: [facility] ENDOSCOPY;  Service: Endoscopy;  Laterality: N/A;   HOT HEMOSTASIS N/A 05/07/2014   Procedure: HOT HEMOSTASIS (ARGON PLASMA COAGULATION/BICAP);  Surgeon: Inda Castle, MD;  Location: [facility];  Service: Endoscopy;  Laterality: N/A;  deuodenal bulb   SHOULDER ARTHROSCOPY W/ ROTATOR CUFF REPAIR Right     Family History  Problem Relation Age of Onset   Heart disease Mother    Heart disease Father    Cancer Other        husband--esophageal   Diabetes Son    Stroke Daughter    Breast cancer Neg Hx     Social History:  reports that she quit smoking about 34 years ago. Her smoking use included cigarettes. She has a 90.00 pack-year smoking history. She has never used smokeless tobacco. She reports current alcohol use. She reports that she does not use drugs.  Allergies:  Allergies  Allergen Reactions   Losartan Other (See Comments) and Cough    High calcium count and renal problems   Nsaids     Other reaction(s): Kidney Disorder   Ace Inhibitors Other (See Comments) and Cough    Dizziness and coughing    Sulfa Antibiotics Hives   Codeine Nausea Only   Heparin Anxiety and Other (See Comments)    Pt reports that they have a sensitivity towards heparin. Pt becomes depressed and anxious to the point of crying uncontrollably.     Medications: Scheduled:  ambrisentan  10 mg Oral Daily   amLODipine  5 mg Oral Daily   atorvastatin  20 mg Oral Daily  ferrous sulfate  325 mg Oral Daily   folic acid  950 mcg Oral QPM   levothyroxine  50 mcg Oral Q0600   pantoprazole (PROTONIX) IV  40 mg Intravenous Q12H   predniSONE  5 mg Oral Q breakfast   tadalafil  40 mg Oral QPM   torsemide  10 mg Oral Daily   vitamin B-12  1,000 mcg Oral Daily   Continuous:  Results for orders placed or performed during the hospital encounter of 05/30/21 (from the past 24 hour(s))  Resp Panel by RT-PCR (Flu A&B, Covid) Nasopharyngeal Swab     Status: None   Collection Time: 05/30/21  7:01 PM   Specimen: Nasopharyngeal Swab; Nasopharyngeal(NP) swabs in vial transport medium  Result Value Ref Range   SARS Coronavirus 2 by RT PCR NEGATIVE NEGATIVE   Influenza A by PCR NEGATIVE NEGATIVE    Influenza B by PCR NEGATIVE NEGATIVE  Comprehensive metabolic panel     Status: Abnormal   Collection Time: 05/30/21  7:31 PM  Result Value Ref Range   Sodium 143 135 - 145 mmol/L   Potassium 3.9 3.5 - 5.1 mmol/L   Chloride 107 98 - 111 mmol/L   CO2 23 22 - 32 mmol/L   Glucose, Bld 184 (H) 70 - 99 mg/dL   BUN 48 (H) 8 - 23 mg/dL   Creatinine, Ser 1.65 (H) 0.44 - 1.00 mg/dL   Calcium 8.1 (L) 8.9 - 10.3 mg/dL   Total Protein 6.5 6.5 - 8.1 g/dL   Albumin 3.0 (L) 3.5 - 5.0 g/dL   AST 28 15 - 41 U/L   ALT 26 0 - 44 U/L   Alkaline Phosphatase 62 38 - 126 U/L   Total Bilirubin 0.3 0.3 - 1.2 mg/dL   GFR, Estimated 31 (L) >60 mL/min   Anion gap 13 5 - 15  CBC with Differential     Status: Abnormal   Collection Time: 05/30/21  7:31 PM  Result Value Ref Range   WBC 11.2 (H) 4.0 - 10.5 K/uL   RBC 2.46 (L) 3.87 - 5.11 MIL/uL   Hemoglobin 7.4 (L) 12.0 - 15.0 g/dL   HCT 23.8 (L) 36.0 - 46.0 %   MCV 96.7 80.0 - 100.0 fL   MCH 30.1 26.0 - 34.0 pg   MCHC 31.1 30.0 - 36.0 g/dL   RDW 14.6 11.5 - 15.5 %   Platelets 445 (H) 150 - 400 K/uL   nRBC 0.2 0.0 - 0.2 %   Neutrophils Relative % 87 %   Neutro Abs 9.8 (H) 1.7 - 7.7 K/uL   Lymphocytes Relative 5 %   Lymphs Abs 0.5 (L) 0.7 - 4.0 K/uL   Monocytes Relative 7 %   Monocytes Absolute 0.8 0.1 - 1.0 K/uL   Eosinophils Relative 1 %   Eosinophils Absolute 0.1 0.0 - 0.5 K/uL   Basophils Relative 0 %   Basophils Absolute 0.0 0.0 - 0.1 K/uL   Immature Granulocytes 0 %   Abs Immature Granulocytes 0.05 0.00 - 0.07 K/uL  Brain natriuretic peptide     Status: Abnormal   Collection Time: 05/30/21  7:31 PM  Result Value Ref Range   B Natriuretic Peptide 562.0 (H) 0.0 - 100.0 pg/mL  Sample to Blood Bank     Status: None   Collection Time: 05/30/21  7:35 PM  Result Value Ref Range   Blood Bank Specimen SAMPLE AVAILABLE FOR TESTING    Sample Expiration      05/31/2021,2359 Performed at [facility]  [facility], [place] [address], [place], [place] [address]    Type and screen [place]     Status: None   Collection Time: 05/30/21  7:35 PM  Result Value Ref Range   ABO/RH(D) A POS    Antibody Screen NEG    Sample Expiration 06/02/2021,2359    Unit Number [id number]    Blood Component Type RED CELLS,LR    Unit division 00    Status of Unit ISSUED,FINAL    Transfusion Status OK TO TRANSFUSE    Crossmatch Result      Compatible Performed at [facility], [place] [address], [place], [place] [address]   ABO/Rh     Status: None   Collection Time: 05/30/21  7:50 PM  Result Value Ref Range   ABO/RH(D)      A POS Performed at [facility] [address] [place], [place] [address]   Prepare RBC (crossmatch)     Status: None   Collection Time: 05/30/21  9:07 PM  Result Value Ref Range   Order Confirmation      ORDER PROCESSED BY BLOOD BANK Performed at [facility], [place] [address] [place], [place] [address]   I-stat chem 8, ED (not at [facility] or [facility])     Status: Abnormal   Collection Time: 05/30/21 10:13 PM  Result Value Ref Range   Sodium 142 135 - 145 mmol/L   Potassium 3.6 3.5 - 5.1 mmol/L   Chloride 107 98 - 111 mmol/L   BUN 46 (H) 8 - 23 mg/dL   Creatinine, Ser 1.50 (H) 0.44 - 1.00 mg/dL   Glucose, Bld 128 (H) 70 - 99 mg/dL   Calcium, Ion 0.99 (L) 1.15 - 1.40 mmol/L   TCO2 25 22 - 32 mmol/L   Hemoglobin 7.5 (L) 12.0 - 15.0 g/dL   HCT 22.0 (L) 36.0 - 46.0 %  POC occult blood, ED Provider will collect     Status: Abnormal   Collection Time: 05/30/21 10:37 PM  Result Value Ref Range   Fecal Occult Bld POSITIVE (A) NEGATIVE  CBC     Status: Abnormal   Collection Time: 05/31/21  2:57 AM  Result Value Ref Range   WBC 10.5 4.0 - 10.5 K/uL   RBC 2.92 (L) 3.87 - 5.11 MIL/uL   Hemoglobin 8.9 (L) 12.0 - 15.0 g/dL   HCT 28.3 (L) 36.0 - 46.0 %   MCV 96.9 80.0 - 100.0 fL   MCH 30.5 26.0 - 34.0 pg   MCHC 31.4 30.0 - 36.0 g/dL   RDW 14.1 11.5 - 15.5 %   Platelets 412 (H) 150 - 400 K/uL   nRBC 0.2  0.0 - 0.2 %  Basic metabolic panel     Status: Abnormal   Collection Time: 05/31/21  2:57 AM  Result Value Ref Range   Sodium 145 135 - 145 mmol/L   Potassium 4.1 3.5 - 5.1 mmol/L   Chloride 110 98 - 111 mmol/L   CO2 24 22 - 32 mmol/L   Glucose, Bld 90 70 - 99 mg/dL   BUN 46 (H) 8 - 23 mg/dL   Creatinine, Ser 1.52 (H) 0.44 - 1.00 mg/dL   Calcium 8.0 (L) 8.9 - 10.3 mg/dL   GFR, Estimated 34 (L) >60 mL/min   Anion gap 11 5 - 15  Brain natriuretic peptide     Status: Abnormal   Collection Time: 05/31/21  2:57 AM  Result Value Ref Range  B Natriuretic Peptide 785.4 (H) 0.0 - 100.0 pg/mL  Magnesium     Status: None   Collection Time: 05/31/21  2:57 AM  Result Value Ref Range   Magnesium 2.4 1.7 - 2.4 mg/dL     DG Chest 2 View  Result Date: 05/30/2021 CLINICAL DATA:  Shortness of breath. EXAM: CHEST - 2 VIEW COMPARISON:  Chest x-ray 05/20/2021.  Chest CT 12/24/2020. FINDINGS: Scarring is again seen in the bilateral lung apices. There is a stable calcified granuloma in the left lung apex. There is no new focal lung infiltrate, pleural effusion or pneumothorax identified. Cardiomediastinal silhouette is within normal limits. No acute fractures are seen. IMPRESSION: No evidence for pneumonia or edema. Stable chronic changes in the lung apices. Electronically Signed   By: Ronney Asters M.D.   On: 05/30/2021 21:31   CT Chest High Resolution  Result Date: 05/31/2021 CLINICAL DATA:  86 year old female with history of hypoxemia. EXAM: CT CHEST WITHOUT CONTRAST TECHNIQUE: Multidetector CT imaging of the chest was performed following the standard protocol without intravenous contrast. High resolution imaging of the lungs, as well as inspiratory and expiratory imaging, was performed. RADIATION DOSE REDUCTION: This exam was performed according to the departmental dose-optimization program which includes automated exposure control, adjustment of the mA and/or kV according to patient size and/or use of  iterative reconstruction technique. COMPARISON:  Chest CT 12/24/2020. FINDINGS: Cardiovascular: Heart size is mildly enlarged. There is no significant pericardial fluid, thickening or pericardial calcification. There is aortic atherosclerosis, as well as atherosclerosis of the great vessels of the mediastinum and the coronary arteries, including calcified atherosclerotic plaque in the left main, left anterior descending, left circumflex and right coronary arteries. Calcifications of the aortic valve. Mediastinum/Nodes: No pathologically enlarged mediastinal or hilar lymph nodes. Please note that accurate exclusion of hilar adenopathy is limited on noncontrast CT scans. Esophagus is unremarkable in appearance. No axillary lymphadenopathy. Lungs/Pleura: Study is limited by considerable patient respiratory motion. With these limitations in mind, high-resolution images demonstrates some patchy areas of mild ground-glass attenuation and predominantly interlobular septal thickening throughout the lungs bilaterally, favored to reflect a background of interstitial pulmonary edema. No definitive regions of traction bronchiectasis or honeycombing are noted. There is some bilateral apical pleuroparenchymal thickening and architectural distortion, most compatible with areas of chronic post infectious or inflammatory scarring. No confluent consolidative airspace disease. No pleural effusions. No definite suspicious appearing pulmonary nodules or masses are noted. Inspiratory and expiratory imaging is considered nearly nondiagnostic, but suggests the presence of some mild air trapping. Upper Abdomen: Aortic atherosclerosis. Musculoskeletal: There are no aggressive appearing lytic or blastic lesions noted in the visualized portions of the skeleton. IMPRESSION: 1. Limited study demonstrating no definitive evidence to suggest interstitial lung disease. 2. There is cardiomegaly with evidence of interstitial pulmonary edema; imaging  findings suggestive of congestive heart failure, as above. 3. Aortic atherosclerosis, in addition to left main and three-vessel coronary artery disease. 4. There are calcifications of the aortic valve. Echocardiographic correlation for evaluation of potential valvular dysfunction may be warranted if clinically indicated. 5. Mild air trapping, suggesting small airways disease. Aortic Atherosclerosis (ICD10-I70.0). Electronically Signed   By: Vinnie Langton M.D.   On: 05/31/2021 04:47   DG Chest Port 1 View  Result Date: 05/31/2021 CLINICAL DATA:  86 year old female with history of shortness of breath. EXAM: PORTABLE CHEST 1 VIEW COMPARISON:  Chest x-ray 05/30/2021. FINDINGS: Mild diffuse interstitial prominence and diffuse peribronchial cuffing. Lung volumes are normal. No consolidative airspace disease. No pleural  effusions. No pneumothorax. Cephalization of the pulmonary vasculature. Heart size is mildly enlarged. The patient is rotated to the right on today's exam, resulting in distortion of the mediastinal contours and reduced diagnostic sensitivity and specificity for mediastinal pathology. Atherosclerotic calcifications in the thoracic aorta. IMPRESSION: 1. The appearance the chest suggests mild congestive heart failure, as above. Electronically Signed   By: Vinnie Langton M.D.   On: 05/31/2021 07:43    ROS:  As stated above in the HPI otherwise negative.  Blood pressure 128/72, pulse 69, temperature 98.8 F (37.1 C), resp. rate (!) 36, height _0  (1.473 m), weight 40 kg, SpO2 100 %.    PE: Gen: NAD, Alert and Oriented HEENT:  [place]/AT, EOMI Neck: Supple, no LAD Lungs: CTA Bilaterally CV: RRR without M/G/R ABD: Soft, NTND, +BS Ext: No C/C/E  Assessment/Plan: 1) Anemia. 2) Melena. 3) Severe pulmonary HTN.   The patient's severe respiratory status is the limiting factor.  In fact, when she was being worked up for her diarrhea secondary to bacterial overgrowth she declined an unsedated  FFS as she was told by a pulmonologist in the past that she cannot tolerate any procedures.  There is no optimal solution and the only path is to manage the situation with blood transfusions, iron infusions, continuing oral iron supplementation, and PPI.  Unless Pulmonary and Anesthesia clears her, and she consents, supportive care is recommended.  Plan: 1) As outlined above. 2) Agree with holding Eliquis.  It is unclear how long she can tolerate holding her anticoagulation.  Hagen Bohorquez D 05/31/2021, 10:33 AM

## 2021-05-31 NOTE — Progress Notes (Addendum)
PROGRESS NOTE                                                                                                                                                                                                             Patient Demographics:    Meghan Wise, is a 86 y.o. female, DOB - 08-Oct-1935, QZE:[id number]  Outpatient Primary MD for the patient is Collene Leyden, MD    LOS - 0  Admit date - 05/30/2021    Chief Complaint  Patient presents with   Abnormal Lab       Brief Narrative (HPI from H&P)  86 y.o. female with medical history significant of hypertension, hyperlipidemia, hypothyroidism, CKD stage IIIb, scleroderma, chronic diastolic CHF, pulmonary hypertension, chronic respiratory failure on 4 L oxygen, simply diagnosed with DVT and possible PE and was placed on Eliquis.  She claims that a few days ago she had dark-colored stool which was almost black in color, subsequently went to see a pulmonologist for her underlying pulmonary hypertension where she had blood work and was found to have hemoglobin of 6.8 and then asked to come to the ER at [facility] for further evaluation of GI bleed and acute anemia.   Subjective:    Meghan Wise today has, No headache, No chest pain, No abdominal pain - No Nausea, No new weakness tingling or numbness, no shortness of breath chills new but is having some left shoulder discomfort since last night   Assessment  & Plan :     Acute on chronic hypoxic respiratory failure brought on by anemia in the setting of underlying advanced pulmonary hypertension and chronic respiratory failure - she had melanotic stools, posttransfusion H&H appears stable, she uses between 3 to 5 L of nasal cannula oxygen at home, continue supportive care with oxygen, transfusion.  He also requested pulmonary to monitor the patient while she is here due to her underlying pulmonary hypertension.  2.  Acute upper GI  blood loss related anemia.  She was recently placed on Eliquis and had melanotic stools few days ago, she is s/p 1 packed RBC transfusion last night with stabilization of H&H, Eliquis held, IV PPI continue.  Case discussed with Dr. Benson Norway.  No invasive procedure right now due to her frail status.  Supportive care and hold Eliquis as long as we can for the  next 7 to 10 days.  3.  History of DVT in November 2022.  Eliquis held due to #2 above, will check lower extremity venous duplex and VQ scan.  If there is evidence of new clot she will require IVC filter.  Addendum 6 pm - Ven [place] - R.Pop Vein DVT ( also in Nov) could be old but with new L. Shoulder pain, VQ possible PE, ongoing GIB - low reserve and frail status - IR for IVC filter - consulted.   4.  Advanced pulmonary hypertension.  Between 3 to 5 L of nasal cannula oxygen at home.  Pulmonary consulted defer management to them.  5.  History of chronic diastolic CHF.  Last known EF in November 2022 is 65%.  Cardiology has been consulted, currently appears euvolemic, poor candidate for left or right heart cath due to multiple comorbidities and extremely frail status.  Continue home dose Demadex for now.  6.  Dyslipidemia.  On statin.  7.  Severe cachexia due to underlying pulmonary hypertension, scleroderma and advanced age.  Supportive care.  She is DNR and will involve palliative care also for goals of care.  8.  Hypertension.  On Norvasc.  9.  Hypothyroidism.  On Synthroid.  10.  History of scleroderma.  Supportive care.  11.  CKD stage IIIb.  Baseline creatinine around 1.6.  Follows with Dr. Marval Regal.  Monitor.  12.  Ongoing left shoulder pain.  Obtain x-ray to rule out fracture, supportive care for now.  EKG nonacute.  Echo pending.       Condition - Extremely Guarded  Family Communication  :  daughter [name] [phone] on 05/31/21  Code Status :  DNR  Consults  :  Cards, PCCM, GI, Pall Care  PUD Prophylaxis : PPI    Procedures  :     TTE  Leg [place]  VQ  CT High Res - 1. Limited study demonstrating no definitive evidence to suggest interstitial lung disease. 2. There is cardiomegaly with evidence of interstitial pulmonary edema; imaging findings suggestive of congestive heart failure, as above. 3. Aortic atherosclerosis, in addition to left main and three-vessel coronary artery disease. 4. There are calcifications of the aortic valve. Echocardiographic correlation for evaluation of potential valvular dysfunction may be warranted if clinically indicated. 5. Mild air trapping, suggesting small airways disease. Aortic Atherosclerosis      Disposition Plan  :    Status is: Inpatient Remains inpatient appropriate because: GI Bleed  DVT Prophylaxis  :    Lab Results  Component Value Date   PLT 412 (H) 05/31/2021    Diet :  Diet Order     None        Inpatient Medications  Scheduled Meds:  ambrisentan  10 mg Oral Daily   amLODipine  5 mg Oral Daily   atorvastatin  20 mg Oral Daily   ferrous sulfate  325 mg Oral Daily   folic acid  563 mcg Oral QPM   levothyroxine  50 mcg Oral Q0600   pantoprazole (PROTONIX) IV  40 mg Intravenous Q12H   predniSONE  5 mg Oral Q breakfast   tadalafil  40 mg Oral QPM   torsemide  10 mg Oral Daily   vitamin B-12  1,000 mcg Oral Daily   Continuous Infusions: PRN Meds:.acetaminophen **OR** acetaminophen, melatonin, morphine injection, traMADol  Antibiotics  :    Anti-infectives (From admission, onward)    None        Time Spent in minutes  30  Lala Lund M.D on 05/31/2021 at 9:55 AM  To page go to [email or web address]   [facility] -  Office  [phone]  See all Orders from today for further details    Objective:   Vitals:   05/31/21 0545 05/31/21 0600 05/31/21 0830 05/31/21 0915  BP: 140/78 134/80 (!) 134/59 (!) 137/58  Pulse: 84 85 75   Resp: 20 (!) 23 (!) 37   Temp:      TempSrc:      SpO2: 100% 100% 100%   Weight:       Height:        Wt Readings from Last 3 Encounters:  05/30/21 40 kg  05/27/21 40.6 kg  03/23/21 38.7 kg     Intake/Output Summary (Last 24 hours) at 05/31/2021 0955 Last data filed at 05/31/2021 0305 Gross per 24 hour  Intake 533.17 ml  Output --  Net 533.17 ml     Physical Exam  Awake Alert, No new F.N deficits, Normal affect [place].AT,PERRAL Supple Neck, No JVD,   Symmetrical Chest wall movement, Good air movement bilaterally, CTAB RRR,No Gallops,Rubs or new Murmurs,  +ve B.Sounds, Abd Soft, No tenderness,   No Cyanosis, Clubbing or edema     Data Review:    CBC Recent Labs  Lab 05/30/21 1931 05/30/21 2213 05/31/21 0257  WBC 11.2*  --  10.5  HGB 7.4* 7.5* 8.9*  HCT 23.8* 22.0* 28.3*  PLT 445*  --  412*  MCV 96.7  --  96.9  MCH 30.1  --  30.5  MCHC 31.1  --  31.4  RDW 14.6  --  14.1  LYMPHSABS 0.5*  --   --   MONOABS 0.8  --   --   EOSABS 0.1  --   --   BASOSABS 0.0  --   --     Electrolytes Recent Labs  Lab 05/30/21 1931 05/30/21 2213 05/31/21 0257  NA 143 142 145  K 3.9 3.6 4.1  CL 107 107 110  CO2 23  --  24  GLUCOSE 184* 128* 90  BUN 48* 46* 46*  CREATININE 1.65* 1.50* 1.52*  CALCIUM 8.1*  --  8.0*  AST 28  --   --   ALT 26  --   --   ALKPHOS 62  --   --   BILITOT 0.3  --   --   ALBUMIN 3.0*  --   --   MG  --   --  2.4  BNP 562.0*  --  785.4*    ------------------------------------------------------------------------------------------------------------------ No results for input(s): CHOL, HDL, LDLCALC, TRIG, CHOLHDL, LDLDIRECT in the last 72 hours.  No results found for: HGBA1C  No results for input(s): TSH, T4TOTAL, T3FREE, THYROIDAB in the last 72 hours.  Invalid input(s): FREET3 ------------------------------------------------------------------------------------------------------------------ ID Labs Recent Labs  Lab 05/30/21 1931 05/30/21 2213 05/31/21 0257  WBC 11.2*  --  10.5  PLT 445*  --  412*  CREATININE 1.65* 1.50*  1.52*   Cardiac Enzymes No results for input(s): CKMB, TROPONINI, MYOGLOBIN in the last 168 hours.  Invalid input(s): CK   Radiology Reports DG Chest 2 View  Result Date: 05/30/2021 CLINICAL DATA:  Shortness of breath. EXAM: CHEST - 2 VIEW COMPARISON:  Chest x-ray 05/20/2021.  Chest CT 12/24/2020. FINDINGS: Scarring is again seen in the bilateral lung apices. There is a stable calcified granuloma in the left lung apex. There is no new focal lung infiltrate, pleural effusion or pneumothorax identified. Cardiomediastinal silhouette is within normal limits.  No acute fractures are seen. IMPRESSION: No evidence for pneumonia or edema. Stable chronic changes in the lung apices. Electronically Signed   By: Ronney Asters M.D.   On: 05/30/2021 21:31   CT Chest High Resolution  Result Date: 05/31/2021 CLINICAL DATA:  86 year old female with history of hypoxemia. EXAM: CT CHEST WITHOUT CONTRAST TECHNIQUE: Multidetector CT imaging of the chest was performed following the standard protocol without intravenous contrast. High resolution imaging of the lungs, as well as inspiratory and expiratory imaging, was performed. RADIATION DOSE REDUCTION: This exam was performed according to the departmental dose-optimization program which includes automated exposure control, adjustment of the mA and/or kV according to patient size and/or use of iterative reconstruction technique. COMPARISON:  Chest CT 12/24/2020. FINDINGS: Cardiovascular: Heart size is mildly enlarged. There is no significant pericardial fluid, thickening or pericardial calcification. There is aortic atherosclerosis, as well as atherosclerosis of the great vessels of the mediastinum and the coronary arteries, including calcified atherosclerotic plaque in the left main, left anterior descending, left circumflex and right coronary arteries. Calcifications of the aortic valve. Mediastinum/Nodes: No pathologically enlarged mediastinal or hilar lymph nodes. Please  note that accurate exclusion of hilar adenopathy is limited on noncontrast CT scans. Esophagus is unremarkable in appearance. No axillary lymphadenopathy. Lungs/Pleura: Study is limited by considerable patient respiratory motion. With these limitations in mind, high-resolution images demonstrates some patchy areas of mild ground-glass attenuation and predominantly interlobular septal thickening throughout the lungs bilaterally, favored to reflect a background of interstitial pulmonary edema. No definitive regions of traction bronchiectasis or honeycombing are noted. There is some bilateral apical pleuroparenchymal thickening and architectural distortion, most compatible with areas of chronic post infectious or inflammatory scarring. No confluent consolidative airspace disease. No pleural effusions. No definite suspicious appearing pulmonary nodules or masses are noted. Inspiratory and expiratory imaging is considered nearly nondiagnostic, but suggests the presence of some mild air trapping. Upper Abdomen: Aortic atherosclerosis. Musculoskeletal: There are no aggressive appearing lytic or blastic lesions noted in the visualized portions of the skeleton. IMPRESSION: 1. Limited study demonstrating no definitive evidence to suggest interstitial lung disease. 2. There is cardiomegaly with evidence of interstitial pulmonary edema; imaging findings suggestive of congestive heart failure, as above. 3. Aortic atherosclerosis, in addition to left main and three-vessel coronary artery disease. 4. There are calcifications of the aortic valve. Echocardiographic correlation for evaluation of potential valvular dysfunction may be warranted if clinically indicated. 5. Mild air trapping, suggesting small airways disease. Aortic Atherosclerosis (ICD10-I70.0). Electronically Signed   By: Vinnie Langton M.D.   On: 05/31/2021 04:47   DG Chest Port 1 View  Result Date: 05/31/2021 CLINICAL DATA:  86 year old female with history of  shortness of breath. EXAM: PORTABLE CHEST 1 VIEW COMPARISON:  Chest x-ray 05/30/2021. FINDINGS: Mild diffuse interstitial prominence and diffuse peribronchial cuffing. Lung volumes are normal. No consolidative airspace disease. No pleural effusions. No pneumothorax. Cephalization of the pulmonary vasculature. Heart size is mildly enlarged. The patient is rotated to the right on today's exam, resulting in distortion of the mediastinal contours and reduced diagnostic sensitivity and specificity for mediastinal pathology. Atherosclerotic calcifications in the thoracic aorta. IMPRESSION: 1. The appearance the chest suggests mild congestive heart failure, as above. Electronically Signed   By: Vinnie Langton M.D.   On: 05/31/2021 07:43

## 2021-05-31 NOTE — Progress Notes (Signed)
Bilateral lower extremity venous duplex completed. Refer to "CV Proc" under chart review to view preliminary results.  05/31/2021 11:20 AM Kelby Aline., MHA, RVT, RDCS, RDMS

## 2021-05-31 NOTE — Consult Note (Signed)
NAME:  Meghan Wise, MRN:  [id number], DOB:  May 16, 1935, LOS: 0 ADMISSION DATE:  05/30/2021, CONSULTATION DATE:  05/31/2021 REFERRING MD:  Dr. Candiss Norse, CHIEF COMPLAINT:  Dyspnea    History of Present Illness:  Meghan Wise is a 86 y.o. female with a extensive PMH that includes but isn't limited to; Severe pulmonary hypertension, pulmonary and renal scleroderma, prior PE and DVT anticoagulated on Eliquis, chronic hypoxic respiratory failure on 4L [place], CKD stage IV, HLD, HTN, and CHF who presented to the ED evening of 2/6 for concern of low hemoglobin, increased SOB, and increased weakness. She was recently treated with Z-Pak and prednisone taper per her primary. Patient was seen by Dr. Marshell Garfinkel 2/3 for possible establishment of care with the [facility] office. At that time recommendations were made to increased diuretics and follow back up with primary pulmonologist at [facility]  Patient was then seen by primary pulmonology at [facility] morning of 2/6 and he recommended admission at that time but patient wished to come to [facility] because"I live in the area". He primary pulmonologist recommended high resolution CT (as did ours) and possible left heart cath to better characterize cardiac function. At [facility] patient was also seen with worsened anemia (hgb dropped from 8-6.8) in the setting of anticoagulation and new melena per patient.   On ED arrival patient was seen hemodynamically stable with mild tachypnea. Labwork on admit significant for hgb 7.4, WBC 11.2, BNP 562, creatinine 1.86, GRF 31. FOBT was positive and GI was consulted, anticoagulation was stopped and blood transfusion was ordered per GI. She was admitted per Hospitilist and pulmonary was consulted for additional care   Pertinent  Medical History  Pulmonary hypertension, pulmonary and renal scleroderma, prior PE and DVT anticoagulated on Eliquis, chronic hypoxic respiratory failure on 4L [place], CKD stage IV, HLD, HTN, and CHF  Significant  Hospital Events: Including procedures, antibiotic start and stop dates in addition to other pertinent events   2/6 admitted for worsened anemia, malaise, and SOB  Images  2/6 High resolution chest CT > Limited study demonstrating no definitive evidence to suggest interstitial lung disease. Mild air trapping, suggesting small airways diseaseCardiomegaly with evidence of interstitial pulmonary edema. Aortic atherosclerosis  Interim History / Subjective:  As above   Objective   Blood pressure 134/80, pulse 85, temperature 98.8 F (37.1 C), resp. rate (!) 23, height _0  (1.473 m), weight 40 kg, SpO2 100 %.        Intake/Output Summary (Last 24 hours) at 05/31/2021 0816 Last data filed at 05/31/2021 0305 Gross per 24 hour  Intake 533.17 ml  Output --  Net 533.17 ml   Filed Weights   05/30/21 1908  Weight: 40 kg    Examination: General: Acute on chronically thin ill appearing elderly female lying in bed, in NAD HEENT: [place]/AT, MM pink/moist, PERRL,  Neuro: Alert and oriented x3 CV: s1s2 regular rate and rhythm, no murmur, rubs, or gallops,  PULM:  Shallow respiration with tachypnea and observed pulmonary splinting, course breath sounds bilaterally, on 4L [place] GI: soft, bowel sounds active in all 4 quadrants, non-tender, non-distended Extremities: warm/dry, no edema  Skin: no rashes or lesions  Resolved Hospital Problem list     Assessment & Plan:  Pulmonary hypertension -Follow with Dr. Daine Gravel at [facility] pulmonary hypertension clinic. -She was started on Ambrisentan and tadalafil in March 2015.  Tyvaso was attempted in June 2021 but had to stop due to side effects in 2022 -On torsemide for diuresis  Pulmonary and renal scleroderma -For scleroderma she follows with Dr. Dossie Der, rheumatology.  She has been unable to tolerate CellCept and is on chronic prednisone at 5 mg. Prior PE and DVT anticoagulated on Eliquis -Hospitalized november 2022 for dyspnea, chest pain with concern for PE,  D-dimmer was elevated and VQ scan was intermediate. Positive right popliteal vein DVT.  Started on Eliquis for anticoagulation  Chronic hypoxic respiratory failure on 4L [place] P: Consult HF team  Repeat ECHO Check troponin  Needs right and maybe left heart cath prior to discharge  May need to consider adding oral prostacyclin for triple therapy coverage of PAH Re-check lower extremity dopplers Pulmonary hygiene  Resumption of anticoagulation per GI  May need to consider IVC filter  Strict intake and output   Best Practice (right click and "Reselect all SmartList Selections" daily)  Per primary   Labs   CBC: Recent Labs  Lab 05/30/21 1931 05/30/21 2213 05/31/21 0257  WBC 11.2*  --  10.5  NEUTROABS 9.8*  --   --   HGB 7.4* 7.5* 8.9*  HCT 23.8* 22.0* 28.3*  MCV 96.7  --  96.9  PLT 445*  --  412*    Basic Metabolic Panel: Recent Labs  Lab 05/30/21 1931 05/30/21 2213 05/31/21 0257  NA 143 142 145  K 3.9 3.6 4.1  CL 107 107 110  CO2 23  --  24  GLUCOSE 184* 128* 90  BUN 48* 46* 46*  CREATININE 1.65* 1.50* 1.52*  CALCIUM 8.1*  --  8.0*  MG  --   --  2.4   GFR: Estimated Creatinine Clearance: 17.7 mL/min (A) (by C-G formula based on SCr of 1.52 mg/dL (H)). Recent Labs  Lab 05/30/21 1931 05/31/21 0257  WBC 11.2* 10.5    Liver Function Tests: Recent Labs  Lab 05/30/21 1931  AST 28  ALT 26  ALKPHOS 62  BILITOT 0.3  PROT 6.5  ALBUMIN 3.0*   No results for input(s): LIPASE, AMYLASE in the last 168 hours. No results for input(s): AMMONIA in the last 168 hours.  ABG    Component Value Date/Time   HCO3 20.1 07/21/2016 1605   TCO2 25 05/30/2021 2213   ACIDBASEDEF 4.0 (H) 07/21/2016 1605   O2SAT 36.0 07/21/2016 1605     Coagulation Profile: No results for input(s): INR, PROTIME in the last 168 hours.  Cardiac Enzymes: No results for input(s): CKTOTAL, CKMB, CKMBINDEX, TROPONINI in the last 168 hours.  HbA1C: No results found for: HGBA1C  CBG: No  results for input(s): GLUCAP in the last 168 hours.  Review of Systems:   Please see the history of present illness. All other systems reviewed and are negative   Past Medical History:  She,  has a past medical history of Heart failure ([place]), HTN (hypertension), Hyperlipidemia, Hypothyroidism, Kidney disease, Osteoarthritis, Osteoporosis, Pulmonary hypertension ([place]), Raynaud disease, Scleroderma ([place]), and Shortness of breath dyspnea.   Surgical History:   Past Surgical History:  Procedure Laterality Date   BREAST BIOPSY Bilateral    x    CARDIAC CATHETERIZATION     2'15 [facility]   CHOLECYSTECTOMY     CHOLECYSTECTOMY, LAPAROSCOPIC     COLONOSCOPY WITH PROPOFOL N/A 05/07/2014   Procedure: COLONOSCOPY WITH PROPOFOL;  Surgeon: Inda Castle, MD;  Location: [facility] ENDOSCOPY;  Service: Endoscopy;  Laterality: N/A;   ESOPHAGOGASTRODUODENOSCOPY (EGD) WITH PROPOFOL N/A 05/07/2014   Procedure: ESOPHAGOGASTRODUODENOSCOPY (EGD) WITH PROPOFOL;  Surgeon: Inda Castle, MD;  Location: [facility] ENDOSCOPY;  Service: Endoscopy;  Laterality: N/A;   HOT HEMOSTASIS N/A 05/07/2014   Procedure: HOT HEMOSTASIS (ARGON PLASMA COAGULATION/BICAP);  Surgeon: Inda Castle, MD;  Location: [facility];  Service: Endoscopy;  Laterality: N/A;  deuodenal bulb   SHOULDER ARTHROSCOPY W/ ROTATOR CUFF REPAIR Right      Social History:   reports that she quit smoking about 34 years ago. Her smoking use included cigarettes. She has a 90.00 pack-year smoking history. She has never used smokeless tobacco. She reports current alcohol use. She reports that she does not use drugs.   Family History:  Her family history includes Cancer in an other family member; Diabetes in her son; Heart disease in her father and mother; Stroke in her daughter. There is no history of Breast cancer.   Allergies Allergies  Allergen Reactions   Losartan Other (See Comments) and Cough    High calcium count and renal problems   Nsaids     Other  reaction(s): Kidney Disorder   Ace Inhibitors Other (See Comments) and Cough    Dizziness and coughing    Sulfa Antibiotics Hives   Codeine Nausea Only   Heparin Anxiety and Other (See Comments)    Pt reports that they have a sensitivity towards heparin. Pt becomes depressed and anxious to the point of crying uncontrollably.      Home Medications  Prior to Admission medications   Medication Sig Start Date End Date Taking? Authorizing Provider  Acetaminophen 500 MG capsule Take 1,000 mg by mouth every 6 (six) hours as needed for fever.   Yes [provider]  ambrisentan (LETAIRIS) 10 MG tablet Take 10 mg by mouth daily.    Yes [provider]  amLODipine (NORVASC) 5 MG tablet Take by mouth. 04/22/21  Yes [provider]  apixaban (ELIQUIS) 2.5 MG TABS tablet Take 1 tablet (2.5 mg total) by mouth 2 (two) times daily. 05/12/21  Yes Mannam, Praveen, MD  atorvastatin (LIPITOR) 20 MG tablet Take 20 mg by mouth daily.   Yes [provider]  Cyanocobalamin (VITAMIN B-12 ER PO) Take 1 tablet by mouth daily.   Yes [provider]  denosumab (PROLIA) 60 MG/ML SOSY injection Inject 60 mg into the skin every 6 (six) months.   Yes [provider]  folic acid (FOLVITE) 206 MCG tablet Take 400 mcg by mouth every evening.    Yes [provider]  levothyroxine (SYNTHROID, LEVOTHROID) 50 MCG tablet Take 50 mcg by mouth every morning.   Yes [provider]  loperamide (IMODIUM A-D) 2 MG tablet Take 4 mg by mouth 4 (four) times daily as needed for diarrhea or loose stools.    Yes [provider]  Melatonin 10 MG TABS Take 10 mg by mouth daily as needed (sleep).    Yes [provider]  metroNIDAZOLE (FLAGYL) 500 MG tablet Take 500 mg by mouth 2 (two) times daily.   Yes [provider]  omeprazole (PRILOSEC) 20 MG capsule Take 20 mg by mouth daily.   Yes [provider]  OXYGEN Inhale 2-4 L into the lungs  continuous. 4 L with exertion 2 L at night   Yes [provider]  predniSONE (DELTASONE) 5 MG tablet Take 5 mg by mouth daily with breakfast.   Yes [provider]  tadalafil (ADCIRCA/CIALIS) 20 MG tablet Take 40 mg by mouth every evening.    Yes [provider]  torsemide (DEMADEX) 5 MG tablet Take 1 tablet (5 mg total) by mouth daily. Patient taking  differently: Take 10 mg by mouth daily. 03/23/21  Yes Mannam, Praveen, MD  ferrous sulfate 324 MG TBEC Take 324 mg by mouth daily. Patient not taking: Reported on 05/30/2021    [provider]     Critical care time: NA  Taeler Winning D. Kenton Kingfisher, NP-C [place] [facility] Personal contact information can be found on Amion  05/31/2021, 9:16 AM

## 2021-05-31 NOTE — ED Notes (Signed)
Pt assisted to [facility]. Pt tolerated poorly, reporting pain with breathing has gotten worse. MD notified via secure chat.

## 2021-05-31 NOTE — ED Notes (Signed)
Pt transported to vascular.

## 2021-05-31 NOTE — ED Notes (Signed)
Portable Xray at bedside. Per Xray tech, pt refusing Xray until she has gotten something for pain. Tramadol not yet verified by pharmacy. Will notify Xray when tramadol has been given.

## 2021-05-31 NOTE — ED Notes (Signed)
Pt now reporting "symptoms starting to fade." MD notified. Will hold Nitro and morphine for now.

## 2021-05-31 NOTE — Consult Note (Signed)
Consultation Note Date: 05/31/2021   Patient Name: Meghan Wise  DOB: Jun 10, 1935  MRN: [id number]  Age / Sex: 86 y.o., female  PCP: Collene Leyden, MD Referring Physician: Thurnell Lose, MD  Reason for Consultation: Establishing goals of care, "Pulm HTN, acute DVT, Now GI bleed, frail - GOC"  HPI/Patient Profile: 86 y.o. female  with past medical history of  hypertension, hyperlipidemia, hypothyroidism, CKD stage IIIb, scleroderma, chronic diastolic CHF, pulmonary hypertension, chronic respiratory failure on 4 L oxygen presented to ED from [facility] with complaints of increased dyspnea. She was evaluated at [facility] pulmonary hypertension clinic on 2/6 and labs revealed worsening anemia with hemoglobin 6.8 - she was advised to come to ED. Patient was admitted on 05/30/2021 with dyspnea (thought to be multifactorial in setting of severe pulmonary hypertension, CHF, and worsening anemia), symptomatic anemia, and pulmonary hypertension.  Clinical Assessment and Goals of Care: I have reviewed medical records including EPIC notes, labs, and imaging. Received report from primary RN - no acute concerns. Reports patient has dyspnea on exertion.   Went to visit patient at bedside - no family/visitors present. Patient was lying in bed awake, alert, oriented, and able to participate in conversation. No signs or non-verbal gestures of pain or discomfort noted. No respiratory distress, increased work of breathing, or secretions noted. Patient does have mild-moderate conversational dyspnea during visit today. She endorses pain in her shoulder and shortness of breath. She feels tramadol helps the pain.  Met with patient  to discuss diagnosis, prognosis, GOC, EOL wishes, disposition, and options.  I introduced Palliative Medicine as specialized medical care for people living with serious illness. It focuses on providing relief  from the symptoms and stress of a serious illness. The goal is to improve quality of life for both the patient and the family.  We discussed a brief life review of the patient as well as functional and nutritional status. Patient is retired. Her husband has passed away - they have 5 children. Prior to hospitalization, patient was living at a senior living [facility]. She was independent with ambulating and ADLs. She does not have any HH services see her. She reports her appetite as "good" but does tell me it fluctuates pending her breathing, stating "some days are better than others." Patient first started to notice a decline about 3 weeks ago.  We discussed patient's current illness and what it means in the larger context of patient's on-going co-morbidities.  Patient understands that CHF, CKD, pulmonary hypertension are progressive, non-curable diseases underlying the her current acute medical conditions. Patient has a clear understanding of her acute medical situation. Natural disease trajectory and expectations at EOL were discussed. I attempted to elicit values and goals of care important to the patient. The difference between aggressive medical intervention and comfort care was considered in light of the patient's goals of care. Patient is clear she is willing and open to receive all recommended tests/procedures recommended, including but not limited to: heart catheterization and additional blood transfusions as needed.  Hospice and Palliative Care services outpatient were explained and offered. Patient tells me that she has "learned a lot about the differences" between hospice and Palliative Care living in a senior living facility. She requests that outpatient Palliative Care follow her at discharge.  Advance directives, concepts specific to code status, artificial feeding and hydration, and rehospitalization were considered and discussed. Patient confirms DNR/DNI. She does have a Living  will and HCPOA. Advanced care planning is important to her. We discussed completing a MOST form; however, she is tired and would like her daughter/Meghan Wise to be present. Requested copies of advanced directives.  Discussed with patient the importance of continued conversation with each other and the medical providers regarding overall plan of care and treatment options, ensuring decisions are within the context of the patients values and GOCs.    Questions and concerns were addressed. The patient/family was encouraged to call with questions and/or concerns. PMT card was provided.  Requested RN provide dose of pain medication.  Called Meghan Wise to schedule time to complete MOST form. Provided updates on conversation/GOC as outlined above. Meghan Wise is supportive of patient's wishes. Will meet tomorrow at 9:30a for advanced directive review. Requested copy of HCPOA. Meghan Wise is also concerned patient has not had anything to eat today - noted no diet order was entered. Spoke with attending - soft diet ordered.   Primary Decision Maker: PATIENT    Recommendations/Plan Continue current full scope medical treatment. Patient is open to all recommended tests/procedures at this time, including heart catheterization and additional blood transfusions if needed Continue DNR/DNI Scheduled in person follow up meeting with patient and daughter tomorrow 2/8 at 9:30a Daughter/Meghan Wise is very involved in patient's care and appreciates updates about patient's condition/plan of care PMT will continue to follow and support holistically  Code Status/Advance Care Planning: DNR  Palliative Prophylaxis:  Aspiration, Bowel Regimen, Frequent Pain Assessment, Oral Care, and Turn Reposition  Additional Recommendations (Limitations, Scope, Preferences): Full Scope Treatment  Psycho-social/Spiritual:  Created space and opportunity for patient and family to express thoughts and feelings regarding patient's current medical  situation.  Emotional support and therapeutic listening provided.  Prognosis:  Unable to determine  Discharge Planning: To Be Determined      Primary Diagnoses: Present on Admission:  Symptomatic anemia  Chronic diastolic CHF (congestive heart failure), NYHA class 4 (HCC)  (Resolved) CKD (chronic kidney disease) stage 4, GFR 15-29 ml/min (HCC)  Scleroderma (HCC)  HTN (hypertension)  Hypothyroidism  Dyspnea  Pulmonary hypertension (HCC)  Chronic kidney disease, stage 3b (HCC)  Leukocytosis   I have reviewed the medical record, interviewed the patient and family, and examined the patient. The following aspects are pertinent.  Past Medical History:  Diagnosis Date   Heart failure ([place])    HTN (hypertension)    Hyperlipidemia    Hypothyroidism    Kidney disease    Stage IV- Dr. Servando Salina -LOV 02-11-14   Osteoarthritis    Osteoporosis    Pulmonary hypertension (HCC)    Raynaud disease    reactive with cold and stress mostly fingers.   Scleroderma ([place])    lungs and kidneys   Shortness of breath dyspnea    with exertion-in Cardiac rehab program at [facility].   Social History   Socioeconomic History   Marital status: Widowed    Spouse name: Not on file   Number of children: 5   Years of education: Not on file   Highest education level: Not on file  Occupational History   Occupation: Retired  Comment: Library Asst.  Tobacco Use   Smoking status: Former    Packs/day: 1.50    Years: 60.00    Pack years: 90.00    Types: Cigarettes    Quit date: 04/25/1987    Years since quitting: 34.1   Smokeless tobacco: Never  Vaping Use   Vaping Use: Never used  Substance and Sexual Activity   Alcohol use: Yes    Alcohol/week: 0.0 standard drinks    Comment: OCCASIONALLY- rare   Drug use: No   Sexual activity: Not on file  Other Topics Concern   Not on file  Social History Narrative   Not on file   Social Determinants of Health   Financial Resource Strain: Not on  file  Food Insecurity: Not on file  Transportation Needs: Not on file  Physical Activity: Not on file  Stress: Not on file  Social Connections: Not on file   Family History  Problem Relation Age of Onset   Heart disease Mother    Heart disease Father    Cancer Other        husband--esophageal   Diabetes Son    Stroke Daughter    Breast cancer Neg Hx    Scheduled Meds:  ambrisentan  10 mg Oral Daily   amLODipine  5 mg Oral Daily   atorvastatin  20 mg Oral Daily   ferrous sulfate  325 mg Oral Daily   folic acid  725 mcg Oral QPM   levothyroxine  50 mcg Oral Q0600   pantoprazole (PROTONIX) IV  40 mg Intravenous Q12H   predniSONE  5 mg Oral Q breakfast   tadalafil  40 mg Oral QPM   torsemide  10 mg Oral Daily   vitamin B-12  1,000 mcg Oral Daily   Continuous Infusions: PRN Meds:.acetaminophen **OR** acetaminophen, melatonin, morphine injection, traMADol Medications Prior to Admission:  Prior to Admission medications   Medication Sig Start Date End Date Taking? Authorizing Provider  Acetaminophen 500 MG capsule Take 1,000 mg by mouth every 6 (six) hours as needed for fever.   Yes [provider]  ambrisentan (LETAIRIS) 10 MG tablet Take 10 mg by mouth daily.    Yes [provider]  amLODipine (NORVASC) 5 MG tablet Take by mouth. 04/22/21  Yes [provider]  apixaban (ELIQUIS) 2.5 MG TABS tablet Take 1 tablet (2.5 mg total) by mouth 2 (two) times daily. 05/12/21  Yes Mannam, Praveen, MD  atorvastatin (LIPITOR) 20 MG tablet Take 20 mg by mouth daily.   Yes [provider]  Cyanocobalamin (VITAMIN B-12 ER PO) Take 1 tablet by mouth daily.   Yes [provider]  denosumab (PROLIA) 60 MG/ML SOSY injection Inject 60 mg into the skin every 6 (six) months.   Yes [provider]  folic acid (FOLVITE) 366 MCG tablet Take 400 mcg by mouth every evening.    Yes [provider]  levothyroxine (SYNTHROID, LEVOTHROID) 50 MCG  tablet Take 50 mcg by mouth every morning.   Yes [provider]  loperamide (IMODIUM A-D) 2 MG tablet Take 4 mg by mouth 4 (four) times daily as needed for diarrhea or loose stools.    Yes [provider]  Melatonin 10 MG TABS Take 10 mg by mouth daily as needed (sleep).    Yes [provider]  metroNIDAZOLE (FLAGYL) 500 MG tablet Take 500 mg by mouth 2 (two) times daily.   Yes [provider]  omeprazole (PRILOSEC) 20 MG capsule Take 20  mg by mouth daily.   Yes [provider]  OXYGEN Inhale 2-4 L into the lungs continuous. 4 L with exertion 2 L at night   Yes [provider]  predniSONE (DELTASONE) 5 MG tablet Take 5 mg by mouth daily with breakfast.   Yes [provider]  tadalafil (ADCIRCA/CIALIS) 20 MG tablet Take 40 mg by mouth every evening.    Yes [provider]  torsemide (DEMADEX) 5 MG tablet Take 1 tablet (5 mg total) by mouth daily. Patient taking differently: Take 10 mg by mouth daily. 03/23/21  Yes Mannam, Praveen, MD  ferrous sulfate 324 MG TBEC Take 324 mg by mouth daily. Patient not taking: Reported on 05/30/2021    [provider]   Allergies  Allergen Reactions   Losartan Other (See Comments) and Cough    High calcium count and renal problems   Nsaids     Other reaction(s): Kidney Disorder   Ace Inhibitors Other (See Comments) and Cough    Dizziness and coughing    Sulfa Antibiotics Hives   Codeine Nausea Only   Heparin Anxiety and Other (See Comments)    Pt reports that they have a sensitivity towards heparin. Pt becomes depressed and anxious to the point of crying uncontrollably.    Review of Systems  Constitutional:  Positive for activity change and fatigue. Negative for appetite change.  Respiratory:  Positive for chest tightness and shortness of breath.   Gastrointestinal:  Negative for nausea and vomiting.  Neurological:  Positive for weakness.   Physical Exam Vitals and  nursing note reviewed.  Constitutional:      General: She is not in acute distress.    Comments: Chronically ill and frail appearing  Pulmonary:     Effort: No respiratory distress.     Comments: Conversational dyspnea Skin:    General: Skin is warm and dry.  Neurological:     Mental Status: She is alert and oriented to person, place, and time.  Psychiatric:        Attention and Perception: Attention normal.        Behavior: Behavior is cooperative.        Cognition and Memory: Cognition and memory normal.    Vital Signs: BP (!) 120/56 (BP Location: Left Arm)    Pulse 76    Temp 98.1 F (36.7 C) (Oral)    Resp (!) 23    Ht _0  (1.473 m)    Wt 40 kg    SpO2 98%    BMI 18.43 kg/m  Pain Scale: 0-10   Pain Score: 5    SpO2: SpO2: 98 % O2 Device:SpO2: 98 % O2 Flow Rate: .O2 Flow Rate (L/min): 4 L/min  IO: Intake/output summary:  Intake/Output Summary (Last 24 hours) at 05/31/2021 1748 Last data filed at 05/31/2021 0305 Gross per 24 hour  Intake 533.17 ml  Output --  Net 533.17 ml    LBM:   Baseline Weight: Weight: 40 kg Most recent weight: Weight: 40 kg     Palliative Assessment/Data: PPS 50%     Time In: 1750 Time Out: 1920 Time Total: 90 minutes  Greater than 50%  of this time was spent counseling and coordinating care related to the above assessment and plan.  Signed by: Lin Landsman, NP   Please contact Palliative Medicine Team phone at [phone] for questions and concerns.  For individual provider: See Shea Evans

## 2021-05-31 NOTE — Assessment & Plan Note (Addendum)
Stable. -Continue amlodipine

## 2021-05-31 NOTE — Assessment & Plan Note (Signed)
-  Consult cardiology regarding right heart catheterization as mentioned above.  Continue tadalafil and ambrisertan.

## 2021-05-31 NOTE — ED Notes (Signed)
Pulmonology at bedside assessing pt at this time.

## 2021-05-31 NOTE — Assessment & Plan Note (Signed)
-  Hold Eliquis at this time given concern for acute GI bleed.

## 2021-05-31 NOTE — ED Notes (Signed)
Per Dr. Candiss Norse, give 1 SL Nitro and if pain not improved give 2 mg IV Morphine.

## 2021-06-01 ENCOUNTER — Encounter (HOSPITAL_COMMUNITY)
Admission: EM | Disposition: A | Discharge status: Home Health | Payer: Self-pay | Source: Home / Self Care | Attending: Internal Medicine

## 2021-06-01 ENCOUNTER — Inpatient Hospital Stay (HOSPITAL_COMMUNITY): Payer: Medicare PPO

## 2021-06-01 DIAGNOSIS — N1832 Chronic kidney disease, stage 3b: Secondary | ICD-10-CM | POA: Diagnosis not present

## 2021-06-01 DIAGNOSIS — I480 Paroxysmal atrial fibrillation: Secondary | ICD-10-CM

## 2021-06-01 DIAGNOSIS — Z86718 Personal history of other venous thrombosis and embolism: Secondary | ICD-10-CM

## 2021-06-01 DIAGNOSIS — L8990 Pressure ulcer of unspecified site, unspecified stage: Secondary | ICD-10-CM | POA: Insufficient documentation

## 2021-06-01 DIAGNOSIS — I272 Pulmonary hypertension, unspecified: Secondary | ICD-10-CM

## 2021-06-01 DIAGNOSIS — I5032 Chronic diastolic (congestive) heart failure: Secondary | ICD-10-CM | POA: Diagnosis not present

## 2021-06-01 HISTORY — PX: RIGHT HEART CATH: CATH118263

## 2021-06-01 LAB — CBC WITH DIFFERENTIAL/PLATELET
Abs Immature Granulocytes: 0.04 10*3/uL (ref 0.00–0.07)
Basophils Absolute: 0 10*3/uL (ref 0.0–0.1)
Basophils Relative: 0 %
Eosinophils Absolute: 0.1 10*3/uL (ref 0.0–0.5)
Eosinophils Relative: 1 %
HCT: 23.1 % — ABNORMAL LOW (ref 36.0–46.0)
Hemoglobin: 7.3 g/dL — ABNORMAL LOW (ref 12.0–15.0)
Immature Granulocytes: 0 %
Lymphocytes Relative: 6 %
Lymphs Abs: 0.6 10*3/uL — ABNORMAL LOW (ref 0.7–4.0)
MCH: 29.9 pg (ref 26.0–34.0)
MCHC: 31.6 g/dL (ref 30.0–36.0)
MCV: 94.7 fL (ref 80.0–100.0)
Monocytes Absolute: 1.1 10*3/uL — ABNORMAL HIGH (ref 0.1–1.0)
Monocytes Relative: 11 %
Neutro Abs: 8.1 10*3/uL — ABNORMAL HIGH (ref 1.7–7.7)
Neutrophils Relative %: 82 %
Platelets: 339 10*3/uL (ref 150–400)
RBC: 2.44 MIL/uL — ABNORMAL LOW (ref 3.87–5.11)
RDW: 14.6 % (ref 11.5–15.5)
WBC: 9.9 10*3/uL (ref 4.0–10.5)
nRBC: 0.2 % (ref 0.0–0.2)

## 2021-06-01 LAB — COMPREHENSIVE METABOLIC PANEL
ALT: 242 U/L — ABNORMAL HIGH (ref 0–44)
AST: 359 U/L — ABNORMAL HIGH (ref 15–41)
Albumin: 2.5 g/dL — ABNORMAL LOW (ref 3.5–5.0)
Alkaline Phosphatase: 210 U/L — ABNORMAL HIGH (ref 38–126)
Anion gap: 12 (ref 5–15)
BUN: 41 mg/dL — ABNORMAL HIGH (ref 8–23)
CO2: 22 mmol/L (ref 22–32)
Calcium: 7.3 mg/dL — ABNORMAL LOW (ref 8.9–10.3)
Chloride: 103 mmol/L (ref 98–111)
Creatinine, Ser: 1.69 mg/dL — ABNORMAL HIGH (ref 0.44–1.00)
GFR, Estimated: 30 mL/min — ABNORMAL LOW (ref >60)
Glucose, Bld: 97 mg/dL (ref 70–99)
Potassium: 4 mmol/L (ref 3.5–5.1)
Sodium: 137 mmol/L (ref 135–145)
Total Bilirubin: 0.8 mg/dL (ref 0.3–1.2)
Total Protein: 5.6 g/dL — ABNORMAL LOW (ref 6.5–8.1)

## 2021-06-01 LAB — POCT I-STAT EG7
Acid-base deficit: 2 mmol/L (ref 0.0–2.0)
Acid-base deficit: 2 mmol/L (ref 0.0–2.0)
Bicarbonate: 21.4 mmol/L (ref 20.0–28.0)
Bicarbonate: 21.4 mmol/L (ref 20.0–28.0)
Calcium, Ion: 0.96 mmol/L — ABNORMAL LOW (ref 1.15–1.40)
Calcium, Ion: 0.96 mmol/L — ABNORMAL LOW (ref 1.15–1.40)
HCT: 30 % — ABNORMAL LOW (ref 36.0–46.0)
HCT: 30 % — ABNORMAL LOW (ref 36.0–46.0)
Hemoglobin: 10.2 g/dL — ABNORMAL LOW (ref 12.0–15.0)
Hemoglobin: 10.2 g/dL — ABNORMAL LOW (ref 12.0–15.0)
O2 Saturation: 60 %
O2 Saturation: 61 %
Potassium: 3.7 mmol/L (ref 3.5–5.1)
Potassium: 3.7 mmol/L (ref 3.5–5.1)
Sodium: 140 mmol/L (ref 135–145)
Sodium: 140 mmol/L (ref 135–145)
TCO2: 22 mmol/L (ref 22–32)
TCO2: 22 mmol/L (ref 22–32)
pCO2, Ven: 32.6 mmHg — ABNORMAL LOW (ref 44.0–60.0)
pCO2, Ven: 33 mmHg — ABNORMAL LOW (ref 44.0–60.0)
pH, Ven: 7.421 (ref 7.250–7.430)
pH, Ven: 7.426 (ref 7.250–7.430)
pO2, Ven: 30 mmHg — CL (ref 32.0–45.0)
pO2, Ven: 31 mmHg — CL (ref 32.0–45.0)

## 2021-06-01 LAB — PREPARE RBC (CROSSMATCH)

## 2021-06-01 LAB — TSH: TSH: 3.596 u[IU]/mL (ref 0.350–4.500)

## 2021-06-01 LAB — TROPONIN I (HIGH SENSITIVITY)
Troponin I (High Sensitivity): 27 ng/L — ABNORMAL HIGH (ref <18)
Troponin I (High Sensitivity): 104 ng/L (ref <18)

## 2021-06-01 LAB — BRAIN NATRIURETIC PEPTIDE: B Natriuretic Peptide: 1227.1 pg/mL — ABNORMAL HIGH (ref 0.0–100.0)

## 2021-06-01 LAB — MAGNESIUM: Magnesium: 2.3 mg/dL (ref 1.7–2.4)

## 2021-06-01 LAB — LACTIC ACID, PLASMA
Lactic Acid, Venous: 1.7 mmol/L (ref 0.5–1.9)
Lactic Acid, Venous: 1.6 mmol/L (ref 0.5–1.9)

## 2021-06-01 SURGERY — RIGHT HEART CATH

## 2021-06-01 MED ORDER — HEPARIN (PORCINE) IN NACL 1000-0.9 UT/500ML-% IV SOLN
INTRAVENOUS | Status: DC | PRN
  Administered 2021-06-01: 500 mL

## 2021-06-01 MED ORDER — DIGOXIN 0.25 MG/ML IJ SOLN: 0.2500 mg | Freq: Once | INTRAMUSCULAR | Status: DC

## 2021-06-01 MED ORDER — HEPARIN (PORCINE) IN NACL 1000-0.9 UT/500ML-% IV SOLN
INTRAVENOUS | Status: AC
  Filled 2021-06-01: qty 500

## 2021-06-01 MED ORDER — SODIUM CHLORIDE 0.9% FLUSH: 3.0000 mL | INTRAVENOUS | Status: DC | PRN

## 2021-06-01 MED ORDER — AMIODARONE HCL IN DEXTROSE 360-4.14 MG/200ML-% IV SOLN
60.0000 mg/h | INTRAVENOUS | Status: DC
  Administered 2021-06-01: 60 mg/h via INTRAVENOUS
  Filled 2021-06-01: qty 200

## 2021-06-01 MED ORDER — FUROSEMIDE 10 MG/ML IJ SOLN
40.0000 mg | Freq: Once | INTRAMUSCULAR | Status: AC
  Administered 2021-06-01: 40 mg via INTRAVENOUS

## 2021-06-01 MED ORDER — AMIODARONE LOAD VIA INFUSION
150.0000 mg | Freq: Once | INTRAVENOUS | Status: AC
  Administered 2021-06-01: 150 mg via INTRAVENOUS
  Filled 2021-06-01: qty 83.34

## 2021-06-01 MED ORDER — METOPROLOL TARTRATE 5 MG/5ML IV SOLN
5.0000 mg | Freq: Once | INTRAVENOUS | Status: AC
  Administered 2021-06-01: 5 mg via INTRAVENOUS

## 2021-06-01 MED ORDER — FUROSEMIDE 10 MG/ML IJ SOLN
INTRAMUSCULAR | Status: AC
  Filled 2021-06-01: qty 4

## 2021-06-01 MED ORDER — SODIUM CHLORIDE 0.9 % IV SOLN: INTRAVENOUS | Status: DC

## 2021-06-01 MED ORDER — LIDOCAINE HCL (PF) 1 % IJ SOLN
INTRAMUSCULAR | Status: AC
  Filled 2021-06-01: qty 30

## 2021-06-01 MED ORDER — AMIODARONE HCL IN DEXTROSE 360-4.14 MG/200ML-% IV SOLN
30.0000 mg/h | INTRAVENOUS | Status: DC
  Filled 2021-06-01: qty 200

## 2021-06-01 MED ORDER — AMIODARONE HCL IN DEXTROSE 360-4.14 MG/200ML-% IV SOLN
INTRAVENOUS | Status: AC
  Filled 2021-06-01: qty 200

## 2021-06-01 MED ORDER — SODIUM CHLORIDE 0.9% FLUSH
3.0000 mL | Freq: Two times a day (BID) | INTRAVENOUS | Status: DC
  Administered 2021-06-02 – 2021-06-03 (×4): 3 mL via INTRAVENOUS

## 2021-06-01 MED ORDER — METOPROLOL TARTRATE 5 MG/5ML IV SOLN
INTRAVENOUS | Status: AC
  Filled 2021-06-01: qty 5

## 2021-06-01 MED ORDER — LIDOCAINE HCL (PF) 1 % IJ SOLN
INTRAMUSCULAR | Status: DC | PRN
  Administered 2021-06-01 (×2): 2 mL

## 2021-06-01 MED ORDER — POTASSIUM CHLORIDE CRYS ER 20 MEQ PO TBCR
20.0000 meq | EXTENDED_RELEASE_TABLET | Freq: Once | ORAL | Status: AC
  Administered 2021-06-01: 20 meq via ORAL
  Filled 2021-06-01: qty 1

## 2021-06-01 MED ORDER — SODIUM CHLORIDE 0.9 % IV SOLN: 250.0000 mL | INTRAVENOUS | Status: DC | PRN

## 2021-06-01 MED ORDER — SODIUM CHLORIDE 0.9% IV SOLUTION: Freq: Once | INTRAVENOUS | Status: DC

## 2021-06-01 SURGICAL SUPPLY — 9 items
CATH BALLN WEDGE 5F 110CM (CATHETERS) ×1 IMPLANT
KIT HEART LEFT (KITS) ×2 IMPLANT
PACK CARDIAC CATHETERIZATION (CUSTOM PROCEDURE TRAY) ×2 IMPLANT
PROTECTION STATION PRESSURIZED (MISCELLANEOUS) ×2
SHEATH GLIDE SLENDER 4/5FR (SHEATH) ×1 IMPLANT
STATION PROTECTION PRESSURIZED (MISCELLANEOUS) IMPLANT
TRANSDUCER W/STOPCOCK (MISCELLANEOUS) ×2 IMPLANT
WIRE EMERALD 3MM-J .025X260CM (WIRE) ×1 IMPLANT
WIRE MICROINTRODUCER 60CM (WIRE) ×1 IMPLANT

## 2021-06-01 NOTE — Progress Notes (Signed)
PROGRESS NOTE                                                                                                                                                                                                             Patient Demographics:    Meghan Wise, is a 86 y.o. female, DOB - 04/30/1935, XGK:[id number]  Outpatient Primary MD for the patient is Collene Leyden, MD    LOS - 1  Admit date - 05/30/2021    Chief Complaint  Patient presents with   Abnormal Lab       Brief Narrative (HPI from H&P)   86 y.o. female with medical history significant of hypertension, hyperlipidemia, hypothyroidism, CKD stage IIIb, scleroderma, chronic diastolic CHF, pulmonary hypertension, chronic respiratory failure on 4 L oxygen, simply diagnosed with DVT and possible PE and was placed on Eliquis.  She claims that a few days ago she had dark-colored stool which was almost black in color, subsequently went to see a pulmonologist for her underlying pulmonary hypertension where she had blood work and was found to have hemoglobin of 6.8 and then asked to come to the ER at [facility] for further evaluation of GI bleed and acute anemia.  As well she was noted to have A-fib with RVR on 06/01/2021.   Subjective:    Meghan Wise today report some dyspnea palpitation overnight, she had noted to be in A-fib with RVR.  She converted back to normal sinus rhythm this a.m. without intervention.     Assessment  & Plan :    Acute on chronic hypoxic respiratory failure -Known underlying pulmonary hypertension, chronic respiratory failure on 5 L nasal cannula -Has worsened due to anemia in the setting of acute GI bleed.  -PCCM . cardiology input greatly appreciated, plan for right heart cath today to evaluate for etiology like volume overload versus worsening pulmonary hypertension. -No evidence of ILD on CT chest  Acute upper GI blood loss related anemia.    -She was recently placed on Eliquis and had melanotic stools few days ago, she is s/p 1 packed RBC transfusion last night with stabilization of H&H, Eliquis held, IV PPI continue.  Case discussed with Dr. Benson Norway.  No invasive procedure right now due to her frail status.  Supportive care and hold Eliquis as long as we can for the next 7  to 10 days. -Pain did drop to 7.3 today, will give 1 unit PRBC.  History of DVT in November 2022.  With current DVT, likely PE - Eliquis held due GI bleed . -She has equivocal VQ scan which may indicate a new PE, or extremity Dopplers showing right popliteal vein DVT. -At this point cannot do anticoagulation due to GI bleed, IR consulted for IVC filter.   Advanced pulmonary hypertension.   - Between 3 to 5 L of nasal cannula oxygen at home.  Pulmonary consulted defer management to them. -For right cardiac cath today.  A-fib with RVR -Back to normal sinus rhythm this morning, will continue to monitor without intervention, no amiodarone  due to elevated LFTs.  Transaminitis -Sudden elevation overnight, most likely due to low blood pressure, continue to trend.  History of chronic diastolic CHF.  Last known EF in November 2022 is 65%.  Cardiology has been consulted, currently appears euvolemic, poor candidate for left or right heart cath due to multiple comorbidities and extremely frail status.  Continue home dose Demadex for now. -Plan for right cardiac cath today to assess volume status  Dyslipidemia.  On statin.  Severe protein calorie malnutrition due to underlying pulmonary hypertension, scleroderma and advanced age.  Supportive care.  She is DNR and will involve palliative care also for goals of care.  Hypertension.  On Norvasc.  Hypothyroidism.  On Synthroid.  History of scleroderma.  Supportive care.   CKD stage IIIb.  Baseline creatinine around 1.6.  Follows with Dr. Marval Regal.  Monitor.   Ongoing left shoulder pain.  Obtain x-ray to rule out  fracture, supportive care for now.  EKG nonacute.  Echo pending.       Condition - Extremely Guarded  Family Communication  :  D/W patient, none at bedside  Code Status :  DNR  Consults  :  Cards, PCCM, GI, Pall Care  PUD Prophylaxis : PPI   Procedures  :     TTE  Leg [place]  VQ  CT High Res - 1. Limited study demonstrating no definitive evidence to suggest interstitial lung disease. 2. There is cardiomegaly with evidence of interstitial pulmonary edema; imaging findings suggestive of congestive heart failure, as above. 3. Aortic atherosclerosis, in addition to left main and three-vessel coronary artery disease. 4. There are calcifications of the aortic valve. Echocardiographic correlation for evaluation of potential valvular dysfunction may be warranted if clinically indicated. 5. Mild air trapping, suggesting small airways disease. Aortic Atherosclerosis      Disposition Plan  :    Status is: Inpatient Remains inpatient appropriate because: GI Bleed  DVT Prophylaxis  :    Lab Results  Component Value Date   PLT 339 06/01/2021    Diet :  Diet Order             Diet NPO time specified Except for: Sips with Meds  Diet effective midnight           Diet NPO time specified Except for: Sips with Meds  Diet effective now                    Inpatient Medications  Scheduled Meds:  sodium chloride   Intravenous Once   ambrisentan  10 mg Oral Daily   amLODipine  5 mg Oral Daily   atorvastatin  20 mg Oral Daily   ferrous sulfate  325 mg Oral Daily   folic acid  921 mcg Oral QPM   levothyroxine  50  mcg Oral Q0600   metoprolol tartrate       pantoprazole (PROTONIX) IV  40 mg Intravenous Q12H   predniSONE  5 mg Oral Q breakfast   tadalafil  40 mg Oral QPM   torsemide  10 mg Oral Daily   vitamin B-12  1,000 mcg Oral Daily   Continuous Infusions:  sodium chloride 10 mL/hr at 06/01/21 0847   amiodarone 60 mg/hr (06/01/21 0958)   Followed by   amiodarone     PRN  Meds:.acetaminophen **OR** acetaminophen, melatonin, morphine injection, traMADol  Antibiotics  :    Anti-infectives (From admission, onward)    None         Phillips Climes M.D on 06/01/2021 at 12:47 PM  To page go to [email or web address]   [facility] -  Office  [phone]  See all Orders from today for further details    Objective:   Vitals:   06/01/21 0749 06/01/21 0754 06/01/21 0923 06/01/21 1200  BP:  (!) 104/56 127/61 (!) 115/59  Pulse: 75 68 72 62  Resp: (!) _0 Temp:    98.4 F (36.9 C)  TempSrc:    Oral  SpO2: 98% 99% 98%   Weight:      Height:        Wt Readings from Last 3 Encounters:  05/30/21 40 kg  05/27/21 40.6 kg  03/23/21 38.7 kg     Intake/Output Summary (Last 24 hours) at 06/01/2021 1247 Last data filed at 06/01/2021 2094 Gross per 24 hour  Intake 217.5 ml  Output --  Net 217.5 ml     Physical Exam  Awake Alert, Oriented X 3, No new F.N deficits, Normal affect, frail  Symmetrical Chest wall movement, Good air movement bilaterally, CTAB RRR,No Gallops,Rubs or new Murmurs, No Parasternal Heave +ve B.Sounds, Abd Soft, No tenderness, No rebound - guarding or rigidity. No Cyanosis, Clubbing or edema, No new Rash or bruise       Data Review:    CBC Recent Labs  Lab 05/30/21 1931 05/30/21 2213 05/31/21 0257 05/31/21 0920 05/31/21 1937 06/01/21 0133  WBC 11.2*  --  10.5 12.6* 12.5* 9.9  HGB 7.4* 7.5* 8.9* 7.4* 8.1* 7.3*  HCT 23.8* 22.0* 28.3* 23.2* 26.1* 23.1*  PLT 445*  --  412* 363 382 339  MCV 96.7  --  96.9 95.5 94.9 94.7  MCH 30.1  --  30.5 30.5 29.5 29.9  MCHC 31.1  --  31.4 31.9 31.0 31.6  RDW 14.6  --  14.1 14.1 14.5 14.6  LYMPHSABS 0.5*  --   --   --   --  0.6*  MONOABS 0.8  --   --   --   --  1.1*  EOSABS 0.1  --   --   --   --  0.1  BASOSABS 0.0  --   --   --   --  0.0    Electrolytes Recent Labs  Lab 05/30/21 1931 05/30/21 2213 05/31/21 0257 06/01/21 0133 06/01/21 0622 06/01/21 1130  NA 143  142 145 137  --   --   K 3.9 3.6 4.1 4.0  --   --   CL 107 107 110 103  --   --   CO2 23  --  24 22  --   --   GLUCOSE 184* 128* 90 97  --   --   BUN 48* 46* 46* 41*  --   --   CREATININE 1.65* 1.50*  1.52* 1.69*  --   --   CALCIUM 8.1*  --  8.0* 7.3*  --   --   AST 28  --   --  359*  --   --   ALT 26  --   --  242*  --   --   ALKPHOS 62  --   --  210*  --   --   BILITOT 0.3  --   --  0.8  --   --   ALBUMIN 3.0*  --   --  2.5*  --   --   MG  --   --  2.4 2.3  --   --   LATICACIDVEN  --   --   --   --  1.7 1.6  TSH  --   --   --   --  3.596  --   BNP 562.0*  --  785.4* 1,227.1*  --   --     ------------------------------------------------------------------------------------------------------------------ No results for input(s): CHOL, HDL, LDLCALC, TRIG, CHOLHDL, LDLDIRECT in the last 72 hours.  No results found for: HGBA1C  Recent Labs    06/01/21 0622  TSH 3.596   ------------------------------------------------------------------------------------------------------------------ ID Labs Recent Labs  Lab 05/30/21 1931 05/30/21 2213 05/31/21 0257 05/31/21 0920 05/31/21 1937 06/01/21 0133 06/01/21 0622 06/01/21 1130  WBC 11.2*  --  10.5 12.6* 12.5* 9.9  --   --   PLT 445*  --  412* 363 382 339  --   --   LATICACIDVEN  --   --   --   --   --   --  1.7 1.6  CREATININE 1.65* 1.50* 1.52*  --   --  1.69*  --   --    Cardiac Enzymes No results for input(s): CKMB, TROPONINI, MYOGLOBIN in the last 168 hours.  Invalid input(s): CK   Radiology Reports DG Chest 2 View  Result Date: 05/30/2021 CLINICAL DATA:  Shortness of breath. EXAM: CHEST - 2 VIEW COMPARISON:  Chest x-ray 05/20/2021.  Chest CT 12/24/2020. FINDINGS: Scarring is again seen in the bilateral lung apices. There is a stable calcified granuloma in the left lung apex. There is no new focal lung infiltrate, pleural effusion or pneumothorax identified. Cardiomediastinal silhouette is within normal limits. No acute  fractures are seen. IMPRESSION: No evidence for pneumonia or edema. Stable chronic changes in the lung apices. Electronically Signed   By: Ronney Asters M.D.   On: 05/30/2021 21:31   CT Chest High Resolution  Result Date: 05/31/2021 CLINICAL DATA:  86 year old female with history of hypoxemia. EXAM: CT CHEST WITHOUT CONTRAST TECHNIQUE: Multidetector CT imaging of the chest was performed following the standard protocol without intravenous contrast. High resolution imaging of the lungs, as well as inspiratory and expiratory imaging, was performed. RADIATION DOSE REDUCTION: This exam was performed according to the departmental dose-optimization program which includes automated exposure control, adjustment of the mA and/or kV according to patient size and/or use of iterative reconstruction technique. COMPARISON:  Chest CT 12/24/2020. FINDINGS: Cardiovascular: Heart size is mildly enlarged. There is no significant pericardial fluid, thickening or pericardial calcification. There is aortic atherosclerosis, as well as atherosclerosis of the great vessels of the mediastinum and the coronary arteries, including calcified atherosclerotic plaque in the left main, left anterior descending, left circumflex and right coronary arteries. Calcifications of the aortic valve. Mediastinum/Nodes: No pathologically enlarged mediastinal or hilar lymph nodes. Please note that accurate exclusion of hilar adenopathy is limited on noncontrast CT scans. Esophagus  is unremarkable in appearance. No axillary lymphadenopathy. Lungs/Pleura: Study is limited by considerable patient respiratory motion. With these limitations in mind, high-resolution images demonstrates some patchy areas of mild ground-glass attenuation and predominantly interlobular septal thickening throughout the lungs bilaterally, favored to reflect a background of interstitial pulmonary edema. No definitive regions of traction bronchiectasis or honeycombing are noted. There is  some bilateral apical pleuroparenchymal thickening and architectural distortion, most compatible with areas of chronic post infectious or inflammatory scarring. No confluent consolidative airspace disease. No pleural effusions. No definite suspicious appearing pulmonary nodules or masses are noted. Inspiratory and expiratory imaging is considered nearly nondiagnostic, but suggests the presence of some mild air trapping. Upper Abdomen: Aortic atherosclerosis. Musculoskeletal: There are no aggressive appearing lytic or blastic lesions noted in the visualized portions of the skeleton. IMPRESSION: 1. Limited study demonstrating no definitive evidence to suggest interstitial lung disease. 2. There is cardiomegaly with evidence of interstitial pulmonary edema; imaging findings suggestive of congestive heart failure, as above. 3. Aortic atherosclerosis, in addition to left main and three-vessel coronary artery disease. 4. There are calcifications of the aortic valve. Echocardiographic correlation for evaluation of potential valvular dysfunction may be warranted if clinically indicated. 5. Mild air trapping, suggesting small airways disease. Aortic Atherosclerosis (ICD10-I70.0). Electronically Signed   By: Vinnie Langton M.D.   On: 05/31/2021 04:47   NM Pulmonary Perf and Vent  Result Date: 05/31/2021 CLINICAL DATA:  Back pain, chest pain, shortness of breath EXAM: NUCLEAR MEDICINE PERFUSION LUNG SCAN TECHNIQUE: Perfusion images were obtained in multiple projections after intravenous injection of radiopharmaceutical. Ventilation scans intentionally deferred if perfusion scan and chest x-ray adequate for interpretation during COVID 19 epidemic. RADIOPHARMACEUTICALS:  Four mCi Tc-35mMAA IV COMPARISON:  Chest radiograph done earlier today FINDINGS: There is small linear wedge-shaped area of decreased tracer uptake in the posterior right upper lung fields. No other focal abnormality is seen. Evaluation is limited without  ventilation images. IMPRESSION: There is small linear wedge shaped area of decreased uptake in the posterior right upper lung fields. Study is indeterminate to evaluate for pulmonary embolism. If there is continued clinical suspicion for pulmonary embolism CT pulmonary angiogram and possibly venous Doppler examination of lower extremities may be considered. Electronically Signed   By: PElmer PickerM.D.   On: 05/31/2021 16:06   DG CHEST PORT 1 VIEW  Result Date: 06/01/2021 CLINICAL DATA:  Shortness of breath and tachycardia EXAM: PORTABLE CHEST 1 VIEW COMPARISON:  Yesterday FINDINGS: Cardiomegaly and vascular pedicle widening. Chronic interstitial coarsening and reticulation. Recent high resolution chest CT. No effusion or pneumothorax. IMPRESSION: Cardiomegaly and vascular congestion/interstitial edema. No new abnormality. Electronically Signed   By: JJorje GuildM.D.   On: 06/01/2021 06:01   DG Chest Port 1 View  Result Date: 05/31/2021 CLINICAL DATA:  86year old female with history of shortness of breath. EXAM: PORTABLE CHEST 1 VIEW COMPARISON:  Chest x-ray 05/30/2021. FINDINGS: Mild diffuse interstitial prominence and diffuse peribronchial cuffing. Lung volumes are normal. No consolidative airspace disease. No pleural effusions. No pneumothorax. Cephalization of the pulmonary vasculature. Heart size is mildly enlarged. The patient is rotated to the right on today's exam, resulting in distortion of the mediastinal contours and reduced diagnostic sensitivity and specificity for mediastinal pathology. Atherosclerotic calcifications in the thoracic aorta. IMPRESSION: 1. The appearance the chest suggests mild congestive heart failure, as above. Electronically Signed   By: DVinnie LangtonM.D.   On: 05/31/2021 07:43   DG Shoulder Left Port  Result Date: 05/31/2021 CLINICAL DATA:  Left shoulder pain EXAM: LEFT SHOULDER COMPARISON:  None. FINDINGS: There is no evidence of fracture or dislocation. No  significant arthropathy or other focal bone abnormality. Soft tissues are unremarkable. IMPRESSION: No acute osseous abnormality Electronically Signed   By: Yetta Glassman M.D.   On: 05/31/2021 10:37   VAS [place] LOWER EXTREMITY VENOUS (DVT)  Result Date: 05/31/2021  Lower Venous DVT Study Patient Name:  DEVORY MCKINZIE  Date of Exam:   05/31/2021 Medical Rec #: [id number]          Accession #:    [id number] Date of Birth: 1935-12-18           Patient Gender: F Patient Age:   61 years Exam Location:  [facility] Procedure:      VAS [place] LOWER EXTREMITY VENOUS (DVT) Referring Phys: Loree Fee HARRIS --------------------------------------------------------------------------------  Indications: Edema.  Comparison Study: 03/11/2021 bilateral lower extremity venous duplex- age                   indeterminate DVT right popliteal vein. Performing Technologist: Maudry Mayhew MHA, RDMS, RVT, RDCS  Examination Guidelines: A complete evaluation includes B-mode imaging, spectral Doppler, color Doppler, and power Doppler as needed of all accessible portions of each vessel. Bilateral testing is considered an integral part of a complete examination. Limited examinations for reoccurring indications may be performed as noted. The reflux portion of the exam is performed with the patient in reverse Trendelenburg.  +---------+---------------+---------+-----------+----------+-----------------+  RIGHT     Compressibility Phasicity Spontaneity Properties Thrombus Aging     +---------+---------------+---------+-----------+----------+-----------------+  CFV       Full            Yes       Yes                                       +---------+---------------+---------+-----------+----------+-----------------+  SFJ       Full                                                                +---------+---------------+---------+-----------+----------+-----------------+  FV Prox   Full                                                                 +---------+---------------+---------+-----------+----------+-----------------+  FV Mid    Full                                                                +---------+---------------+---------+-----------+----------+-----------------+  FV Distal Full                                                                +---------+---------------+---------+-----------+----------+-----------------+  PFV       Full                                                                +---------+---------------+---------+-----------+----------+-----------------+  POP       Partial         Yes       Yes                    Age Indeterminate  +---------+---------------+---------+-----------+----------+-----------------+  PTV       Full                                                                +---------+---------------+---------+-----------+----------+-----------------+  PERO      Full                                                                +---------+---------------+---------+-----------+----------+-----------------+   +---------+---------------+---------+-----------+----------+--------------+  LEFT      Compressibility Phasicity Spontaneity Properties Thrombus Aging  +---------+---------------+---------+-----------+----------+--------------+  CFV       Full            Yes       Yes                                    +---------+---------------+---------+-----------+----------+--------------+  SFJ       Full                                                             +---------+---------------+---------+-----------+----------+--------------+  FV Prox   Full                                                             +---------+---------------+---------+-----------+----------+--------------+  FV Mid    Full                                                             +---------+---------------+---------+-----------+----------+--------------+  FV Distal Full                                                              +---------+---------------+---------+-----------+----------+--------------+  PFV       Full                                                             +---------+---------------+---------+-----------+----------+--------------+  POP       Full            Yes       Yes                                    +---------+---------------+---------+-----------+----------+--------------+  PTV       Full                                                             +---------+---------------+---------+-----------+----------+--------------+  PERO      Full                                                             +---------+---------------+---------+-----------+----------+--------------+     Summary: RIGHT: - Findings consistent with age indeterminate deep vein thrombosis involving the right popliteal vein. - No cystic structure found in the popliteal fossa.  LEFT: - Findings appear essentially unchanged compared to previous examination. - There is no evidence of deep vein thrombosis in the lower extremity.  - No cystic structure found in the popliteal fossa.  *See table(s) above for measurements and observations. Electronically signed by Servando Snare MD on 05/31/2021 at 2:34:50 PM.    Final    ECHOCARDIOGRAM LIMITED  Result Date: 05/31/2021    ECHOCARDIOGRAM LIMITED REPORT   Patient Name:   ERISHA PAUGH Date of Exam: 05/31/2021 Medical Rec #:  [id number]         Height:       58.0 in Accession #:    [id number]        Weight:       88.2 lb Date of Birth:  [birth year]          BSA:          1.286 m Patient Age:    86 years          BP:           128/72 mmHg Patient Gender: F                 HR:           65 bpm. Exam Location:  Inpatient Procedure: Limited Echo, Limited Color Doppler and Cardiac Doppler Indications:    dyspnea  History:        Patient has prior history of Echocardiogram examinations, most                 recent 03/12/2021. Raynaud's. Scleroderma. Chronic kidney                 disease., Signs/Symptoms:Dyspnea;  Risk Factors:Hypertension.  Sonographer:  Johny Chess RDCS Referring Phys: Pearl  1. Left ventricular ejection fraction, by estimation, is 60 to 65%. The left ventricle has normal function. The left ventricle has no regional wall motion abnormalities. Left ventricular diastolic parameters are consistent with Grade II diastolic dysfunction (pseudonormalization). Elevated left atrial pressure. There is the interventricular septum is flattened in systole, consistent with right ventricular pressure overload.  2. Right ventricular systolic function is normal. There is severely elevated pulmonary artery systolic pressure. The estimated right ventricular systolic pressure is 57.9 mmHg.  3. The mitral valve is normal in structure. No evidence of mitral valve regurgitation. No evidence of mitral stenosis.  4. Tricuspid valve regurgitation is moderate to severe.  5. The aortic valve is normal in structure. Aortic valve regurgitation is mild. No aortic stenosis is present.  6. The inferior vena cava is normal in size with greater than 50% respiratory variability, suggesting right atrial pressure of 3 mmHg. FINDINGS  Left Ventricle: Left ventricular ejection fraction, by estimation, is 60 to 65%. The left ventricle has normal function. The left ventricle has no regional wall motion abnormalities. The left ventricular internal cavity size was normal in size. There is  no left ventricular hypertrophy. The interventricular septum is flattened in systole, consistent with right ventricular pressure overload. Left ventricular diastolic parameters are consistent with Grade II diastolic dysfunction (pseudonormalization). Elevated left atrial pressure. Right Ventricle: No increase in right ventricular wall thickness. Right ventricular systolic function is normal. There is severely elevated pulmonary artery systolic pressure. The tricuspid regurgitant velocity is 4.01 m/s, and with an assumed right  atrial pressure of 3 mmHg, the estimated right ventricular systolic pressure is 72.8 mmHg. Left Atrium: Left atrial size was normal in size. Right Atrium: Right atrial size was normal in size. Pericardium: There is no evidence of pericardial effusion. Mitral Valve: The mitral valve is normal in structure. There is mild thickening of the mitral valve leaflet(s). No evidence of mitral valve stenosis. Tricuspid Valve: The tricuspid valve is normal in structure. Tricuspid valve regurgitation is moderate to severe. No evidence of tricuspid stenosis. Aortic Valve: The aortic valve is normal in structure. Aortic valve regurgitation is mild. Aortic regurgitation PHT measures 503 msec. No aortic stenosis is present. Pulmonic Valve: The pulmonic valve was normal in structure. Pulmonic valve regurgitation is trivial. No evidence of pulmonic stenosis. Aorta: The aortic root is normal in size and structure. Venous: The inferior vena cava is normal in size with greater than 50% respiratory variability, suggesting right atrial pressure of 3 mmHg. IAS/Shunts: No atrial level shunt detected by color flow Doppler. LEFT VENTRICLE PLAX 2D LVIDd:         4.06 cm Diastology LVIDs:         2.18 cm LV e' medial:    5.55 cm/s LV PW:         1.10 cm LV E/e' medial:  15.2 LV IVS:        0.75 cm LV e' lateral:   5.33 cm/s                        LV E/e' lateral: 15.9  LEFT ATRIUM         Index LA diam:    3.20 cm 2.49 cm/m  AORTIC VALVE AI PHT:      503 msec  AORTA Ao Asc diam: 3.10 cm MITRAL VALVE               TRICUSPID  VALVE MV Area (PHT): 3.53 cm    TR Peak grad:   64.3 mmHg MV Decel Time: 215 msec    TR Vmax:        401.00 cm/s MV E velocity: 84.50 cm/s MV A velocity: 85.10 cm/s MV E/A ratio:  0.99 Mihai Croitoru MD Electronically signed by Sanda Klein MD Signature Date/Time: 05/31/2021/1:23:45 PM    Final

## 2021-06-01 NOTE — Consult Note (Signed)
Chief Complaint: Patient was seen in consultation today for retrievable inferior vena cava filter placement Chief Complaint  Patient presents with   Abnormal Lab   at the request of Dr Bernita Raisin   Supervising Physician: Corrie Mckusick  Patient Status: [facility] - In-pt  History of Present Illness: Meghan Wise is a 86 y.o. female   Hx Pulm HTN; Pulm and Renal scleroderma; chronic resp failure CKD stage 4; HLD; HTN; CHF Hx PE/DVT 02/2021 and on eliquis Presented for anemia; sob and weakness Work up revealing GI Bleed Eliquis stopped  Dr Vaughan Browner note:  DVT, likely PE She has equivocal VQ scan which may indicate a new PE.  Options are limited due to GI bleed We are holding the Eliquis and she is due for IVC filter   NM Pulm study yesterday:  IMPRESSION: There is small linear wedge shaped area of decreased uptake in the posterior right upper lung fields. Study is indeterminate to evaluate for pulmonary embolism. If there is continued clinical suspicion for pulmonary embolism CT pulmonary angiogram and possibly venous Doppler examination of lower extremities may be considered  Doppler study yesterday  Summary: RIGHT: - Findings consistent with age indeterminate deep vein thrombosis involving the right popliteal vein. - No cystic structure found in the popliteal fossa. LEFT: - Findings appear essentially unchanged compared to previous examination. - There is no evidence of deep vein thrombosis in the lower extremity. - No cystic structure found in the popliteal fossa.  Request made for IVC filter placement per Dr Vaughan Browner Discussed with Dr Henrene Dodge procedure Planned for 2/9---pt is for cardiac cath sometime today  Past Medical History:  Diagnosis Date   Heart failure ([place])    HTN (hypertension)    Hyperlipidemia    Hypothyroidism    Kidney disease    Stage IV- Dr. Servando Salina -LOV 02-11-14   Osteoarthritis    Osteoporosis    Pulmonary hypertension ([place])     Raynaud disease    reactive with cold and stress mostly fingers.   Scleroderma ([place])    lungs and kidneys   Shortness of breath dyspnea    with exertion-in Cardiac rehab program at [facility].    Past Surgical History:  Procedure Laterality Date   BREAST BIOPSY Bilateral    x    CARDIAC CATHETERIZATION     2'15 [facility]   CHOLECYSTECTOMY     CHOLECYSTECTOMY, LAPAROSCOPIC     COLONOSCOPY WITH PROPOFOL N/A 05/07/2014   Procedure: COLONOSCOPY WITH PROPOFOL;  Surgeon: Inda Castle, MD;  Location: [facility] ENDOSCOPY;  Service: Endoscopy;  Laterality: N/A;   ESOPHAGOGASTRODUODENOSCOPY (EGD) WITH PROPOFOL N/A 05/07/2014   Procedure: ESOPHAGOGASTRODUODENOSCOPY (EGD) WITH PROPOFOL;  Surgeon: Inda Castle, MD;  Location: [facility] ENDOSCOPY;  Service: Endoscopy;  Laterality: N/A;   HOT HEMOSTASIS N/A 05/07/2014   Procedure: HOT HEMOSTASIS (ARGON PLASMA COAGULATION/BICAP);  Surgeon: Inda Castle, MD;  Location: [facility];  Service: Endoscopy;  Laterality: N/A;  deuodenal bulb   SHOULDER ARTHROSCOPY W/ ROTATOR CUFF REPAIR Right     Allergies: Losartan, Nsaids, Ace inhibitors, Sulfa antibiotics, Codeine, and Heparin  Medications: Prior to Admission medications   Medication Sig Start Date End Date Taking? Authorizing Provider  Acetaminophen 500 MG capsule Take 1,000 mg by mouth every 6 (six) hours as needed for fever.   Yes [provider]  ambrisentan (LETAIRIS) 10 MG tablet Take 10 mg by mouth daily.    Yes [provider]  amLODipine (NORVASC) 5 MG tablet Take by mouth. 04/22/21  Yes [provider]  apixaban (ELIQUIS) 2.5 MG TABS tablet Take 1 tablet (2.5 mg total) by mouth 2 (two) times daily. 05/12/21  Yes Mannam, Praveen, MD  atorvastatin (LIPITOR) 20 MG tablet Take 20 mg by mouth daily.   Yes [provider]  Cyanocobalamin (VITAMIN B-12 ER PO) Take 1 tablet by mouth daily.   Yes [provider]  denosumab (PROLIA) 60 MG/ML SOSY injection Inject 60 mg into  the skin every 6 (six) months.   Yes [provider]  folic acid (FOLVITE) 852 MCG tablet Take 400 mcg by mouth every evening.    Yes [provider]  levothyroxine (SYNTHROID, LEVOTHROID) 50 MCG tablet Take 50 mcg by mouth every morning.   Yes [provider]  loperamide (IMODIUM A-D) 2 MG tablet Take 4 mg by mouth 4 (four) times daily as needed for diarrhea or loose stools.    Yes [provider]  Melatonin 10 MG TABS Take 10 mg by mouth daily as needed (sleep).    Yes [provider]  metroNIDAZOLE (FLAGYL) 500 MG tablet Take 500 mg by mouth 2 (two) times daily.   Yes [provider]  omeprazole (PRILOSEC) 20 MG capsule Take 20 mg by mouth daily.   Yes [provider]  OXYGEN Inhale 2-4 L into the lungs continuous. 4 L with exertion 2 L at night   Yes [provider]  predniSONE (DELTASONE) 5 MG tablet Take 5 mg by mouth daily with breakfast.   Yes [provider]  tadalafil (ADCIRCA/CIALIS) 20 MG tablet Take 40 mg by mouth every evening.    Yes [provider]  torsemide (DEMADEX) 5 MG tablet Take 1 tablet (5 mg total) by mouth daily. Patient taking differently: Take 10 mg by mouth daily. 03/23/21  Yes Mannam, Praveen, MD  ferrous sulfate 324 MG TBEC Take 324 mg by mouth daily. Patient not taking: Reported on 05/30/2021    [provider]     Family History  Problem Relation Age of Onset   Heart disease Mother    Heart disease Father    Cancer Other        husband--esophageal   Diabetes Son    Stroke Daughter    Breast cancer Neg Hx     Social History   Socioeconomic History   Marital status: Widowed    Spouse name: Not on file   Number of children: 5   Years of education: Not on file   Highest education level: Not on file  Occupational History   Occupation: Retired    Comment: Library Asst.  Tobacco Use   Smoking status: Former    Packs/day: 1.50    Years: 60.00    Pack  years: 90.00    Types: Cigarettes    Quit date: 04/25/1987    Years since quitting: 34.1   Smokeless tobacco: Never  Vaping Use   Vaping Use: Never used  Substance and Sexual Activity   Alcohol use: Yes    Alcohol/week: 0.0 standard drinks    Comment: OCCASIONALLY- rare   Drug use: No   Sexual activity: Not on file  Other Topics Concern   Not on file  Social History Narrative   Not on file   Social Determinants of Health   Financial Resource Strain: Not on file  Food Insecurity: Not on file  Transportation Needs: Not on file  Physical Activity: Not on file  Stress: Not on file  Social Connections: Not on file  Review of Systems: A 12 point ROS discussed and pertinent positives are indicated in the HPI above.  All other systems are negative.  Review of Systems  Constitutional:  Negative for activity change, fatigue and fever.  HENT:  Positive for hearing loss.   Respiratory:  Positive for shortness of breath.   Cardiovascular:  Negative for chest pain.  Psychiatric/Behavioral:  Negative for behavioral problems and confusion.    Vital Signs: BP 127/61    Pulse 72    Temp 98.1 F (36.7 C) (Oral)    Resp 20    Ht 4' 10" (1.473 m)    Wt 88 lb 2.9 oz (40 kg)    SpO2 98%    BMI 18.43 kg/m   Physical Exam Vitals reviewed.  HENT:     Mouth/Throat:     Mouth: Mucous membranes are moist.  Cardiovascular:     Rate and Rhythm: Normal rate and regular rhythm.     Heart sounds: Normal heart sounds.  Pulmonary:     Effort: Pulmonary effort is normal.     Breath sounds: Normal breath sounds.  Abdominal:     Palpations: Abdomen is soft.  Musculoskeletal:        General: Normal range of motion.     Right lower leg: No edema.     Left lower leg: No edema.  Skin:    General: Skin is warm.  Neurological:     Mental Status: She is alert and oriented to person, place, and time.  Psychiatric:        Behavior: Behavior normal.    Imaging: DG Chest 2 View  Result Date:  05/30/2021 CLINICAL DATA:  Shortness of breath. EXAM: CHEST - 2 VIEW COMPARISON:  Chest x-ray 05/20/2021.  Chest CT 12/24/2020. FINDINGS: Scarring is again seen in the bilateral lung apices. There is a stable calcified granuloma in the left lung apex. There is no new focal lung infiltrate, pleural effusion or pneumothorax identified. Cardiomediastinal silhouette is within normal limits. No acute fractures are seen. IMPRESSION: No evidence for pneumonia or edema. Stable chronic changes in the lung apices. Electronically Signed   By: Ronney Asters M.D.   On: 05/30/2021 21:31   DG Chest 2 View  Result Date: 05/20/2021 CLINICAL DATA:  Cough and shortness of breath.  COPD. EXAM: CHEST - 2 VIEW COMPARISON:  Chest radiograph 03/10/2021, chest CT 12/24/2020 FINDINGS: Cardiac silhouette is at the upper limits of normal size, unchanged. Calcification is again seen within the aortic arch. Moderate hyperinflation. There is again bilateral interstitial thickening chronic scarring. Cystic emphysematous changes are again seen. No definite pleural effusion is seen. Mild multilevel degenerative disc changes of the thoracic spine. IMPRESSION: Chronic changes of interstitial lung disease and emphysema. No definite acute lung process. Electronically Signed   By: Yvonne Kendall M.D.   On: 05/20/2021 18:38   CT Chest High Resolution  Result Date: 05/31/2021 CLINICAL DATA:  86 year old female with history of hypoxemia. EXAM: CT CHEST WITHOUT CONTRAST TECHNIQUE: Multidetector CT imaging of the chest was performed following the standard protocol without intravenous contrast. High resolution imaging of the lungs, as well as inspiratory and expiratory imaging, was performed. RADIATION DOSE REDUCTION: This exam was performed according to the departmental dose-optimization program which includes automated exposure control, adjustment of the mA and/or kV according to patient size and/or use of iterative reconstruction technique. COMPARISON:   Chest CT 12/24/2020. FINDINGS: Cardiovascular: Heart size is mildly enlarged. There is no significant pericardial fluid, thickening or pericardial calcification.  There is aortic atherosclerosis, as well as atherosclerosis of the great vessels of the mediastinum and the coronary arteries, including calcified atherosclerotic plaque in the left main, left anterior descending, left circumflex and right coronary arteries. Calcifications of the aortic valve. Mediastinum/Nodes: No pathologically enlarged mediastinal or hilar lymph nodes. Please note that accurate exclusion of hilar adenopathy is limited on noncontrast CT scans. Esophagus is unremarkable in appearance. No axillary lymphadenopathy. Lungs/Pleura: Study is limited by considerable patient respiratory motion. With these limitations in mind, high-resolution images demonstrates some patchy areas of mild ground-glass attenuation and predominantly interlobular septal thickening throughout the lungs bilaterally, favored to reflect a background of interstitial pulmonary edema. No definitive regions of traction bronchiectasis or honeycombing are noted. There is some bilateral apical pleuroparenchymal thickening and architectural distortion, most compatible with areas of chronic post infectious or inflammatory scarring. No confluent consolidative airspace disease. No pleural effusions. No definite suspicious appearing pulmonary nodules or masses are noted. Inspiratory and expiratory imaging is considered nearly nondiagnostic, but suggests the presence of some mild air trapping. Upper Abdomen: Aortic atherosclerosis. Musculoskeletal: There are no aggressive appearing lytic or blastic lesions noted in the visualized portions of the skeleton. IMPRESSION: 1. Limited study demonstrating no definitive evidence to suggest interstitial lung disease. 2. There is cardiomegaly with evidence of interstitial pulmonary edema; imaging findings suggestive of congestive heart failure,  as above. 3. Aortic atherosclerosis, in addition to left main and three-vessel coronary artery disease. 4. There are calcifications of the aortic valve. Echocardiographic correlation for evaluation of potential valvular dysfunction may be warranted if clinically indicated. 5. Mild air trapping, suggesting small airways disease. Aortic Atherosclerosis (ICD10-I70.0). Electronically Signed   By: Vinnie Langton M.D.   On: 05/31/2021 04:47   NM Pulmonary Perf and Vent  Result Date: 05/31/2021 CLINICAL DATA:  Back pain, chest pain, shortness of breath EXAM: NUCLEAR MEDICINE PERFUSION LUNG SCAN TECHNIQUE: Perfusion images were obtained in multiple projections after intravenous injection of radiopharmaceutical. Ventilation scans intentionally deferred if perfusion scan and chest x-ray adequate for interpretation during COVID 19 epidemic. RADIOPHARMACEUTICALS:  Four mCi Tc-12mMAA IV COMPARISON:  Chest radiograph done earlier today FINDINGS: There is small linear wedge-shaped area of decreased tracer uptake in the posterior right upper lung fields. No other focal abnormality is seen. Evaluation is limited without ventilation images. IMPRESSION: There is small linear wedge shaped area of decreased uptake in the posterior right upper lung fields. Study is indeterminate to evaluate for pulmonary embolism. If there is continued clinical suspicion for pulmonary embolism CT pulmonary angiogram and possibly venous Doppler examination of lower extremities may be considered. Electronically Signed   By: PElmer PickerM.D.   On: 05/31/2021 16:06   DG CHEST PORT 1 VIEW  Result Date: 06/01/2021 CLINICAL DATA:  Shortness of breath and tachycardia EXAM: PORTABLE CHEST 1 VIEW COMPARISON:  Yesterday FINDINGS: Cardiomegaly and vascular pedicle widening. Chronic interstitial coarsening and reticulation. Recent high resolution chest CT. No effusion or pneumothorax. IMPRESSION: Cardiomegaly and vascular congestion/interstitial  edema. No new abnormality. Electronically Signed   By: JJorje GuildM.D.   On: 06/01/2021 06:01   DG Chest Port 1 View  Result Date: 05/31/2021 CLINICAL DATA:  86year old female with history of shortness of breath. EXAM: PORTABLE CHEST 1 VIEW COMPARISON:  Chest x-ray 05/30/2021. FINDINGS: Mild diffuse interstitial prominence and diffuse peribronchial cuffing. Lung volumes are normal. No consolidative airspace disease. No pleural effusions. No pneumothorax. Cephalization of the pulmonary vasculature. Heart size is mildly enlarged. The patient is rotated to the  right on today's exam, resulting in distortion of the mediastinal contours and reduced diagnostic sensitivity and specificity for mediastinal pathology. Atherosclerotic calcifications in the thoracic aorta. IMPRESSION: 1. The appearance the chest suggests mild congestive heart failure, as above. Electronically Signed   By: Vinnie Langton M.D.   On: 05/31/2021 07:43   DG Shoulder Left Port  Result Date: 05/31/2021 CLINICAL DATA:  Left shoulder pain EXAM: LEFT SHOULDER COMPARISON:  None. FINDINGS: There is no evidence of fracture or dislocation. No significant arthropathy or other focal bone abnormality. Soft tissues are unremarkable. IMPRESSION: No acute osseous abnormality Electronically Signed   By: Yetta Glassman M.D.   On: 05/31/2021 10:37   VAS [place] LOWER EXTREMITY VENOUS (DVT)  Result Date: 05/31/2021  Lower Venous DVT Study Patient Name:  SHONTE BEUTLER  Date of Exam:   05/31/2021 Medical Rec #: [id number]          Accession #:    [id number] Date of Birth: February 17, 1936           Patient Gender: F Patient Age:   18 years Exam Location:  [facility] Procedure:      VAS [place] LOWER EXTREMITY VENOUS (DVT) Referring Phys: Loree Fee HARRIS --------------------------------------------------------------------------------  Indications: Edema.  Comparison Study: 03/11/2021 bilateral lower extremity venous duplex- age                   indeterminate  DVT right popliteal vein. Performing Technologist: Maudry Mayhew MHA, RDMS, RVT, RDCS  Examination Guidelines: A complete evaluation includes B-mode imaging, spectral Doppler, color Doppler, and power Doppler as needed of all accessible portions of each vessel. Bilateral testing is considered an integral part of a complete examination. Limited examinations for reoccurring indications may be performed as noted. The reflux portion of the exam is performed with the patient in reverse Trendelenburg.  +---------+---------------+---------+-----------+----------+-----------------+  RIGHT     Compressibility Phasicity Spontaneity Properties Thrombus Aging     +---------+---------------+---------+-----------+----------+-----------------+  CFV       Full            Yes       Yes                                       +---------+---------------+---------+-----------+----------+-----------------+  SFJ       Full                                                                +---------+---------------+---------+-----------+----------+-----------------+  FV Prox   Full                                                                +---------+---------------+---------+-----------+----------+-----------------+  FV Mid    Full                                                                +---------+---------------+---------+-----------+----------+-----------------+  FV Distal Full                                                                +---------+---------------+---------+-----------+----------+-----------------+  PFV       Full                                                                +---------+---------------+---------+-----------+----------+-----------------+  POP       Partial         Yes       Yes                    Age Indeterminate  +---------+---------------+---------+-----------+----------+-----------------+  PTV       Full                                                                 +---------+---------------+---------+-----------+----------+-----------------+  PERO      Full                                                                +---------+---------------+---------+-----------+----------+-----------------+   +---------+---------------+---------+-----------+----------+--------------+  LEFT      Compressibility Phasicity Spontaneity Properties Thrombus Aging  +---------+---------------+---------+-----------+----------+--------------+  CFV       Full            Yes       Yes                                    +---------+---------------+---------+-----------+----------+--------------+  SFJ       Full                                                             +---------+---------------+---------+-----------+----------+--------------+  FV Prox   Full                                                             +---------+---------------+---------+-----------+----------+--------------+  FV Mid    Full                                                             +---------+---------------+---------+-----------+----------+--------------+  FV Distal Full                                                             +---------+---------------+---------+-----------+----------+--------------+  PFV       Full                                                             +---------+---------------+---------+-----------+----------+--------------+  POP       Full            Yes       Yes                                    +---------+---------------+---------+-----------+----------+--------------+  PTV       Full                                                             +---------+---------------+---------+-----------+----------+--------------+  PERO      Full                                                             +---------+---------------+---------+-----------+----------+--------------+     Summary: RIGHT: - Findings consistent with age indeterminate deep vein thrombosis involving the right  popliteal vein. - No cystic structure found in the popliteal fossa.  LEFT: - Findings appear essentially unchanged compared to previous examination. - There is no evidence of deep vein thrombosis in the lower extremity.  - No cystic structure found in the popliteal fossa.  *See table(s) above for measurements and observations. Electronically signed by Servando Snare MD on 05/31/2021 at 2:34:50 PM.    Final    ECHOCARDIOGRAM LIMITED  Result Date: 05/31/2021    ECHOCARDIOGRAM LIMITED REPORT   Patient Name:   Meghan Wise Date of Exam: 05/31/2021 Medical Rec #:  [id number]         Height:       58.0 in Accession #:    [id number]        Weight:       88.2 lb Date of Birth:  11-24-1935          BSA:          1.286 m Patient Age:    89 years          BP:           128/72 mmHg Patient Gender: F                 HR:           65 bpm. Exam Location:  Inpatient Procedure: Limited Echo, Limited Color Doppler and Cardiac Doppler Indications:  dyspnea  History:        Patient has prior history of Echocardiogram examinations, most                 recent 03/12/2021. Raynaud's. Scleroderma. Chronic kidney                 disease., Signs/Symptoms:Dyspnea; Risk Factors:Hypertension.  Sonographer:    Johny Chess RDCS Referring Phys: [place]  1. Left ventricular ejection fraction, by estimation, is 60 to 65%. The left ventricle has normal function. The left ventricle has no regional wall motion abnormalities. Left ventricular diastolic parameters are consistent with Grade II diastolic dysfunction (pseudonormalization). Elevated left atrial pressure. There is the interventricular septum is flattened in systole, consistent with right ventricular pressure overload.  2. Right ventricular systolic function is normal. There is severely elevated pulmonary artery systolic pressure. The estimated right ventricular systolic pressure is 58.6 mmHg.  3. The mitral valve is normal in structure. No evidence of mitral  valve regurgitation. No evidence of mitral stenosis.  4. Tricuspid valve regurgitation is moderate to severe.  5. The aortic valve is normal in structure. Aortic valve regurgitation is mild. No aortic stenosis is present.  6. The inferior vena cava is normal in size with greater than 50% respiratory variability, suggesting right atrial pressure of 3 mmHg. FINDINGS  Left Ventricle: Left ventricular ejection fraction, by estimation, is 60 to 65%. The left ventricle has normal function. The left ventricle has no regional wall motion abnormalities. The left ventricular internal cavity size was normal in size. There is  no left ventricular hypertrophy. The interventricular septum is flattened in systole, consistent with right ventricular pressure overload. Left ventricular diastolic parameters are consistent with Grade II diastolic dysfunction (pseudonormalization). Elevated left atrial pressure. Right Ventricle: No increase in right ventricular wall thickness. Right ventricular systolic function is normal. There is severely elevated pulmonary artery systolic pressure. The tricuspid regurgitant velocity is 4.01 m/s, and with an assumed right atrial pressure of 3 mmHg, the estimated right ventricular systolic pressure is 82.5 mmHg. Left Atrium: Left atrial size was normal in size. Right Atrium: Right atrial size was normal in size. Pericardium: There is no evidence of pericardial effusion. Mitral Valve: The mitral valve is normal in structure. There is mild thickening of the mitral valve leaflet(s). No evidence of mitral valve stenosis. Tricuspid Valve: The tricuspid valve is normal in structure. Tricuspid valve regurgitation is moderate to severe. No evidence of tricuspid stenosis. Aortic Valve: The aortic valve is normal in structure. Aortic valve regurgitation is mild. Aortic regurgitation PHT measures 503 msec. No aortic stenosis is present. Pulmonic Valve: The pulmonic valve was normal in structure. Pulmonic valve  regurgitation is trivial. No evidence of pulmonic stenosis. Aorta: The aortic root is normal in size and structure. Venous: The inferior vena cava is normal in size with greater than 50% respiratory variability, suggesting right atrial pressure of 3 mmHg. IAS/Shunts: No atrial level shunt detected by color flow Doppler. LEFT VENTRICLE PLAX 2D LVIDd:         4.06 cm Diastology LVIDs:         2.18 cm LV e' medial:    5.55 cm/s LV PW:         1.10 cm LV E/e' medial:  15.2 LV IVS:        0.75 cm LV e' lateral:   5.33 cm/s  LV E/e' lateral: 15.9  LEFT ATRIUM         Index LA diam:    3.20 cm 2.49 cm/m  AORTIC VALVE AI PHT:      503 msec  AORTA Ao Asc diam: 3.10 cm MITRAL VALVE               TRICUSPID VALVE MV Area (PHT): 3.53 cm    TR Peak grad:   64.3 mmHg MV Decel Time: 215 msec    TR Vmax:        401.00 cm/s MV E velocity: 84.50 cm/s MV A velocity: 85.10 cm/s MV E/A ratio:  0.99 Mihai Croitoru MD Electronically signed by Sanda Klein MD Signature Date/Time: 05/31/2021/1:23:45 PM    Final     Labs:  CBC: Recent Labs    05/31/21 0257 05/31/21 0920 05/31/21 1937 06/01/21 0133  WBC 10.5 12.6* 12.5* 9.9  HGB 8.9* 7.4* 8.1* 7.3*  HCT 28.3* 23.2* 26.1* 23.1*  PLT 412* 363 382 339    COAGS: No results for input(s): INR, APTT in the last 8760 hours.  BMP: Recent Labs    03/15/21 0327 05/30/21 1931 05/30/21 2213 05/31/21 0257 06/01/21 0133  NA 143 143 142 145 137  K 3.8 3.9 3.6 4.1 4.0  CL 103 107 107 110 103  CO2 27 23  --  24 22  GLUCOSE 70 184* 128* 90 97  BUN 88* 48* 46* 46* 41*  CALCIUM 8.2* 8.1*  --  8.0* 7.3*  CREATININE 2.04* 1.65* 1.50* 1.52* 1.69*  GFRNONAA 24* 31*  --  34* 30*    LIVER FUNCTION TESTS: Recent Labs    03/11/21 1424 03/12/21 0459 05/30/21 1931 06/01/21 0133  BILITOT 0.6 0.7 0.3 0.8  AST 32 25 28 359*  ALT _0 242*  ALKPHOS 124 94 62 210*  PROT 7.8 6.7 6.5 5.6*  ALBUMIN 4.2 3.6 3.0* 2.5*    TUMOR MARKERS: No results for  input(s): AFPTM, CEA, CA199, CHROMGRNA in the last 8760 hours.  Assessment and Plan:  Scheduled for retrievable Inferior vena cava filter placement in IR Planned for 2/9--- pt is scheduled for heart cath today Risks and benefits discussed with the patient including, but not limited to bleeding, infection, contrast induced renal failure, filter fracture or migration which can lead to emergency surgery or even death, strut penetration with damage or irritation to adjacent structures and caval thrombosis.  All of the patient's questions were answered, patient is agreeable to proceed. Consent signed and in chart.   Thank you for this interesting consult.  I greatly enjoyed meeting Meghan Wise and look forward to participating in their care.  A copy of this report was sent to the requesting provider on this date.  Electronically Signed: Lavonia Drafts, PA-C 06/01/2021, 10:50 AM   I spent a total of 40 Minutes    in face to face in clinical consultation, greater than 50% of which was counseling/coordinating care for IVC filter

## 2021-06-01 NOTE — Progress Notes (Signed)
NAME:  Meghan Wise, MRN:  [id number], DOB:  1936-01-02, LOS: 1 ADMISSION DATE:  05/30/2021, CONSULTATION DATE:  05/31/2021 REFERRING MD:  Dr. Candiss Norse, CHIEF COMPLAINT:  Dyspnea    History of Present Illness:  Meghan Wise is a 86 y.o. female with a extensive PMH that includes but isn't limited to; Severe pulmonary hypertension, pulmonary and renal scleroderma, prior PE and DVT anticoagulated on Eliquis, chronic hypoxic respiratory failure on 4L [place], CKD stage IV, HLD, HTN, and CHF who presented to the ED evening of 2/6 for concern of low hemoglobin, increased SOB, and increased weakness. She was recently treated with Z-Pak and prednisone taper per her primary. Patient was seen by Dr. Marshell Garfinkel 2/3 for possible establishment of care with the [facility] office. At that time recommendations were made to increased diuretics and follow back up with primary pulmonologist at [facility]  Patient was then seen by primary pulmonology at [facility] morning of 2/6 and he recommended admission at that time but patient wished to come to [facility] because"I live in the area". He primary pulmonologist recommended high resolution CT (as did ours) and possible left heart cath to better characterize cardiac function. At [facility] patient was also seen with worsened anemia (hgb dropped from 8-6.8) in the setting of anticoagulation and new melena per patient.   On ED arrival patient was seen hemodynamically stable with mild tachypnea. Labwork on admit significant for hgb 7.4, WBC 11.2, BNP 562, creatinine 1.86, GRF 31. FOBT was positive and GI was consulted, anticoagulation was stopped and blood transfusion was ordered per GI. She was admitted per Hospitilist and pulmonary was consulted for additional care   Pertinent  Medical History  Pulmonary hypertension, pulmonary and renal scleroderma, prior PE and DVT anticoagulated on Eliquis, chronic hypoxic respiratory failure on 4L [place], CKD stage IV, HLD, HTN, and CHF  Significant  Hospital Events: Including procedures, antibiotic start and stop dates in addition to other pertinent events   2/6 admitted for worsened anemia, malaise, and SOB  Images  2/6 High resolution chest CT > Limited study demonstrating no definitive evidence to suggest interstitial lung disease. Mild air trapping, suggesting small airways disease. Cardiomegaly with evidence of interstitial pulmonary edema. Aortic atherosclerosis  Interim History / Subjective:   She had an episode of atrial fibrillation in the morning which converted back to normal sinus rhythm.  Getting her second unit of blood.  Continues to be dyspneic  Objective   Blood pressure 127/61, pulse 72, temperature 98.1 F (36.7 C), temperature source Oral, resp. rate 20, height _0  (1.473 m), weight 40 kg, SpO2 98 %.       No intake or output data in the 24 hours ending 06/01/21 1000  Filed Weights   05/30/21 1908  Weight: 40 kg    Examination: Blood pressure 127/61, pulse 72, temperature 98.1 F (36.7 C), temperature source Oral, resp. rate 20, height _1  (1.473 m), weight 40 kg, SpO2 98 %. Gen:      No acute distress, frail HEENT:  EOMI, sclera anicteric Neck:     No masses; no thyromegaly Lungs:    Clear to auscultation bilaterally; normal respiratory effort CV:         Regular rate and rhythm; no murmurs Abd:      + bowel sounds; soft, non-tender; no palpable masses, no distension Ext:    No edema; adequate peripheral perfusion Skin:      Warm and dry; no rash Neuro: alert and oriented x 3  Resolved Hospital Problem list     Assessment & Plan:  GI bleed in the setting of anticoagulation Holding Eliquis, transfuse and follow CBC Follow CBC and transfuse as needed   Pulmonary hypertension, heart failure New onset atrial fibrillation There is concern for other etiology including worsening pulmonary hypertension or left heart failure as her dyspnea has preceded onset of melena.  There is no evidence of  interstitial lung disease on CT   Scheduled for right heart catheterization. If there is worsening pulmonary hypertension then she may need oral prostacyclin which needs to be done as an outpatient.  She has not tolerated Tyvaso in the past   DVT, likely PE She has equivocal VQ scan which may indicate a new PE.  Options are limited due to GI bleed We are holding the Eliquis and she is due for IVC filter  Goals of care Appreciate input from palliative care.  I discussed with Meghan Wise and her daughter at bedside today.  Agree with DNR  Best Practice (right click and "Reselect all SmartList Selections" daily)  Per primary   Critical care time: NA   Marshell Garfinkel MD [facility] See Amion for pager  If no response to pager , please call [phone] until 7pm After 7:00 pm call Elink  [phone] 06/01/2021, 10:01 AM

## 2021-06-01 NOTE — Plan of Care (Signed)

## 2021-06-01 NOTE — Significant Event (Signed)
Patient's nurse notified me that patient became short of breath and tachycardic.  On exam at bedside patient is not in acute distress monitor shows A-fib with RVR.  Blood pressure initially was 120/80 with pulse of 130/min.  Temperature 97.8 respiration 24/min.  EKG shows A-fib with RVR.  I reviewed patient's labs today with the hemoglobin of 7.3 LFTs elevated at 359 and 242 and total bilirubin of 0.8.  Which is new.  I have ordered 1 dose of metoprolol 5 mg IV.  Heart rate did not improve blood pressure was in the low 38S with systolic in the 90 to at times dropping to the 88.  At this point I discussed with Dr. Irish Lack cardiologist who advised to go ahead and transfuse PRBC, which patient is about to receive, which as per cardiology may improve her heart rate.  They will be seeing patient in consult.  Gean Birchwood

## 2021-06-01 NOTE — H&P (View-Only) (Signed)
Cardiology Consultation:   Patient ID: Meghan Wise MRN: [id number]; DOB: 1935/04/30  Admit date: 05/30/2021 Date of Consult: 06/01/2021  PCP:  Collene Leyden, MD   [facility] Providers Cardiologist:  None        Patient Profile:   Meghan Wise is a 86 y.o. female with a hx of pulmonary hypertension who is being seen 06/01/2021 for the evaluation of atrial fibrillation at the request of Dr. Hal Hope.  History of Present Illness:   Meghan Wise has a history of hypertension, hyperlipidemia, hypothyroidism and stage IIIb chronic kidney disease.  She is followed at [facility] for her pulmonary hypertension.  She has been on Eliquis for DVT/PE.  She was noted to have dark-colored stools.  She was found to be anemic with a hemoglobin less than 7.  She has no known history of atrial fibrillation.  Several hours ago, she was found to be in atrial fibrillation with rapid ventricular response.  Blood pressure limited use of AV nodal blocking agents.  Her renal function limited the use of digoxin.  Amiodarone was considered but her LFTs were increased.  She was scheduled to receive blood.  She can feel palpitations with the atrial fibrillation.  Currently, she feels well.   Past Medical History:  Diagnosis Date   Heart failure ([place])    HTN (hypertension)    Hyperlipidemia    Hypothyroidism    Kidney disease    Stage IV- Dr. Servando Salina -LOV 02-11-14   Osteoarthritis    Osteoporosis    Pulmonary hypertension (HCC)    Raynaud disease    reactive with cold and stress mostly fingers.   Scleroderma ([place])    lungs and kidneys   Shortness of breath dyspnea    with exertion-in Cardiac rehab program at [facility].    Past Surgical History:  Procedure Laterality Date   BREAST BIOPSY Bilateral    x    CARDIAC CATHETERIZATION     2'15 [facility]   CHOLECYSTECTOMY     CHOLECYSTECTOMY, LAPAROSCOPIC     COLONOSCOPY WITH PROPOFOL N/A 05/07/2014   Procedure: COLONOSCOPY WITH PROPOFOL;  Surgeon: Inda Castle, MD;  Location: [facility] ENDOSCOPY;  Service: Endoscopy;  Laterality: N/A;   ESOPHAGOGASTRODUODENOSCOPY (EGD) WITH PROPOFOL N/A 05/07/2014   Procedure: ESOPHAGOGASTRODUODENOSCOPY (EGD) WITH PROPOFOL;  Surgeon: Inda Castle, MD;  Location: [facility] ENDOSCOPY;  Service: Endoscopy;  Laterality: N/A;   HOT HEMOSTASIS N/A 05/07/2014   Procedure: HOT HEMOSTASIS (ARGON PLASMA COAGULATION/BICAP);  Surgeon: Inda Castle, MD;  Location: [facility];  Service: Endoscopy;  Laterality: N/A;  deuodenal bulb   SHOULDER ARTHROSCOPY W/ ROTATOR CUFF REPAIR Right        Inpatient Medications: Scheduled Meds:  sodium chloride   Intravenous Once   ambrisentan  10 mg Oral Daily   amiodarone  150 mg Intravenous Once   amLODipine  5 mg Oral Daily   atorvastatin  20 mg Oral Daily   ferrous sulfate  325 mg Oral Daily   folic acid  562 mcg Oral QPM   levothyroxine  50 mcg Oral Q0600   metoprolol tartrate       pantoprazole (PROTONIX) IV  40 mg Intravenous Q12H   predniSONE  5 mg Oral Q breakfast   tadalafil  40 mg Oral QPM   torsemide  10 mg Oral Daily   vitamin B-12  1,000 mcg Oral Daily   Continuous Infusions:  sodium chloride 10 mL/hr at 06/01/21 0847   amiodarone     Followed by  amiodarone     PRN Meds: acetaminophen **OR** acetaminophen, melatonin, morphine injection, traMADol  Allergies:    Allergies  Allergen Reactions   Losartan Other (See Comments) and Cough    High calcium count and renal problems   Nsaids     Other reaction(s): Kidney Disorder   Ace Inhibitors Other (See Comments) and Cough    Dizziness and coughing    Sulfa Antibiotics Hives   Codeine Nausea Only   Heparin Anxiety and Other (See Comments)    Pt reports that they have a sensitivity towards heparin. Pt becomes depressed and anxious to the point of crying uncontrollably.     Social History:   Social History   Socioeconomic History   Marital status: Widowed    Spouse name: Not on file   Number of children:  5   Years of education: Not on file   Highest education level: Not on file  Occupational History   Occupation: Retired    Comment: Library Asst.  Tobacco Use   Smoking status: Former    Packs/day: 1.50    Years: 60.00    Pack years: 90.00    Types: Cigarettes    Quit date: 04/25/1987    Years since quitting: 34.1   Smokeless tobacco: Never  Vaping Use   Vaping Use: Never used  Substance and Sexual Activity   Alcohol use: Yes    Alcohol/week: 0.0 standard drinks    Comment: OCCASIONALLY- rare   Drug use: No   Sexual activity: Not on file  Other Topics Concern   Not on file  Social History Narrative   Not on file   Social Determinants of Health   Financial Resource Strain: Not on file  Food Insecurity: Not on file  Transportation Needs: Not on file  Physical Activity: Not on file  Stress: Not on file  Social Connections: Not on file  Intimate Partner Violence: Not on file    Family History:    Family History  Problem Relation Age of Onset   Heart disease Mother    Heart disease Father    Cancer Other        husband--esophageal   Diabetes Son    Stroke Daughter    Breast cancer Neg Hx      ROS:  Please see the history of present illness.  Palpitations with the atrial fibrillation All other ROS reviewed and negative.     Physical Exam/Data:   Vitals:   06/01/21 0716 06/01/21 0736 06/01/21 0749 06/01/21 0754  BP: 94/75 103/68  (!) 104/56  Pulse:  (!) 134 75 68  Resp: (!) 22 (!) 22 (!) 25 19  Temp:  98.1 F (36.7 C)    TempSrc:  Oral    SpO2:  98% 98% 99%  Weight:      Height:       No intake or output data in the 24 hours ending 06/01/21 0849 Last 3 Weights 05/30/2021 05/27/2021 03/23/2021  Weight (lbs) 88 lb 2.9 oz 89 lb 6.4 oz 85 lb 6.4 oz  Weight (kg) 40 kg 40.552 kg 38.737 kg     Body mass index is 18.43 kg/m.  General:  Well nourished, well developed, in no acute distress, frail HEENT: normal Neck: no JVD Vascular: No carotid bruits; Distal  pulses 2+ bilaterally Cardiac:  normal S1, S2; RRR; 2 out of 6 systolic murmur, RV heave Lungs:  clear to auscultation bilaterally, no wheezing, rhonchi or rales  Abd: soft, nontender, no hepatomegaly  Ext:  tr LE edema Musculoskeletal:  No deformities, BUE and BLE strength normal and equal Skin: warm and dry  Neuro:  CNs 2-12 intact, no focal abnormalities noted Psych:  Normal affect   EKG:  The EKG was personally reviewed and demonstrates: Currently normal sinus rhythm Telemetry:  Telemetry was personally reviewed and demonstrates: Atrial fibrillation with rapid ventricular response, lateral ST depression  Relevant CV Studies: Normal LV function/RV function.  Elevated RV systolic pressure.  Significant tricuspid regurgitation  Laboratory Data:  High Sensitivity Troponin:   Recent Labs  Lab 05/31/21 0920 06/01/21 0622  TROPONINIHS 24* 27*     Chemistry Recent Labs  Lab 05/30/21 1931 05/30/21 2213 05/31/21 0257 06/01/21 0133  NA 143 142 145 137  K 3.9 3.6 4.1 4.0  CL 107 107 110 103  CO2 23  --  24 22  GLUCOSE 184* 128* 90 97  BUN 48* 46* 46* 41*  CREATININE 1.65* 1.50* 1.52* 1.69*  CALCIUM 8.1*  --  8.0* 7.3*  MG  --   --  2.4 2.3  GFRNONAA 31*  --  34* 30*  ANIONGAP 13  --  11 12    Recent Labs  Lab 05/30/21 1931 06/01/21 0133  PROT 6.5 5.6*  ALBUMIN 3.0* 2.5*  AST 28 359*  ALT 26 242*  ALKPHOS 62 210*  BILITOT 0.3 0.8   Lipids No results for input(s): CHOL, TRIG, HDL, LABVLDL, LDLCALC, CHOLHDL in the last 168 hours.  Hematology Recent Labs  Lab 05/31/21 0920 05/31/21 1937 06/01/21 0133  WBC 12.6* 12.5* 9.9  RBC 2.43* 2.75* 2.44*  HGB 7.4* 8.1* 7.3*  HCT 23.2* 26.1* 23.1*  MCV 95.5 94.9 94.7  MCH 30.5 29.5 29.9  MCHC 31.9 31.0 31.6  RDW 14.1 14.5 14.6  PLT 363 382 339   Thyroid  Recent Labs  Lab 06/01/21 0622  TSH 3.596    BNP Recent Labs  Lab 05/30/21 1931 05/31/21 0257 06/01/21 0133  BNP 562.0* 785.4* 1,227.1*    DDimer No  results for input(s): DDIMER in the last 168 hours.   Radiology/Studies:  DG Chest 2 View  Result Date: 05/30/2021 CLINICAL DATA:  Shortness of breath. EXAM: CHEST - 2 VIEW COMPARISON:  Chest x-ray 05/20/2021.  Chest CT 12/24/2020. FINDINGS: Scarring is again seen in the bilateral lung apices. There is a stable calcified granuloma in the left lung apex. There is no new focal lung infiltrate, pleural effusion or pneumothorax identified. Cardiomediastinal silhouette is within normal limits. No acute fractures are seen. IMPRESSION: No evidence for pneumonia or edema. Stable chronic changes in the lung apices. Electronically Signed   By: Ronney Asters M.D.   On: 05/30/2021 21:31   CT Chest High Resolution  Result Date: 05/31/2021 CLINICAL DATA:  86 year old female with history of hypoxemia. EXAM: CT CHEST WITHOUT CONTRAST TECHNIQUE: Multidetector CT imaging of the chest was performed following the standard protocol without intravenous contrast. High resolution imaging of the lungs, as well as inspiratory and expiratory imaging, was performed. RADIATION DOSE REDUCTION: This exam was performed according to the departmental dose-optimization program which includes automated exposure control, adjustment of the mA and/or kV according to patient size and/or use of iterative reconstruction technique. COMPARISON:  Chest CT 12/24/2020. FINDINGS: Cardiovascular: Heart size is mildly enlarged. There is no significant pericardial fluid, thickening or pericardial calcification. There is aortic atherosclerosis, as well as atherosclerosis of the great vessels of the mediastinum and the coronary arteries, including calcified atherosclerotic plaque in the left main, left anterior descending, left  circumflex and right coronary arteries. Calcifications of the aortic valve. Mediastinum/Nodes: No pathologically enlarged mediastinal or hilar lymph nodes. Please note that accurate exclusion of hilar adenopathy is limited on  noncontrast CT scans. Esophagus is unremarkable in appearance. No axillary lymphadenopathy. Lungs/Pleura: Study is limited by considerable patient respiratory motion. With these limitations in mind, high-resolution images demonstrates some patchy areas of mild ground-glass attenuation and predominantly interlobular septal thickening throughout the lungs bilaterally, favored to reflect a background of interstitial pulmonary edema. No definitive regions of traction bronchiectasis or honeycombing are noted. There is some bilateral apical pleuroparenchymal thickening and architectural distortion, most compatible with areas of chronic post infectious or inflammatory scarring. No confluent consolidative airspace disease. No pleural effusions. No definite suspicious appearing pulmonary nodules or masses are noted. Inspiratory and expiratory imaging is considered nearly nondiagnostic, but suggests the presence of some mild air trapping. Upper Abdomen: Aortic atherosclerosis. Musculoskeletal: There are no aggressive appearing lytic or blastic lesions noted in the visualized portions of the skeleton. IMPRESSION: 1. Limited study demonstrating no definitive evidence to suggest interstitial lung disease. 2. There is cardiomegaly with evidence of interstitial pulmonary edema; imaging findings suggestive of congestive heart failure, as above. 3. Aortic atherosclerosis, in addition to left main and three-vessel coronary artery disease. 4. There are calcifications of the aortic valve. Echocardiographic correlation for evaluation of potential valvular dysfunction may be warranted if clinically indicated. 5. Mild air trapping, suggesting small airways disease. Aortic Atherosclerosis (ICD10-I70.0). Electronically Signed   By: Vinnie Langton M.D.   On: 05/31/2021 04:47   NM Pulmonary Perf and Vent  Result Date: 05/31/2021 CLINICAL DATA:  Back pain, chest pain, shortness of breath EXAM: NUCLEAR MEDICINE PERFUSION LUNG SCAN  TECHNIQUE: Perfusion images were obtained in multiple projections after intravenous injection of radiopharmaceutical. Ventilation scans intentionally deferred if perfusion scan and chest x-ray adequate for interpretation during COVID 19 epidemic. RADIOPHARMACEUTICALS:  Four mCi Tc-14mMAA IV COMPARISON:  Chest radiograph done earlier today FINDINGS: There is small linear wedge-shaped area of decreased tracer uptake in the posterior right upper lung fields. No other focal abnormality is seen. Evaluation is limited without ventilation images. IMPRESSION: There is small linear wedge shaped area of decreased uptake in the posterior right upper lung fields. Study is indeterminate to evaluate for pulmonary embolism. If there is continued clinical suspicion for pulmonary embolism CT pulmonary angiogram and possibly venous Doppler examination of lower extremities may be considered. Electronically Signed   By: PElmer PickerM.D.   On: 05/31/2021 16:06   DG CHEST PORT 1 VIEW  Result Date: 06/01/2021 CLINICAL DATA:  Shortness of breath and tachycardia EXAM: PORTABLE CHEST 1 VIEW COMPARISON:  Yesterday FINDINGS: Cardiomegaly and vascular pedicle widening. Chronic interstitial coarsening and reticulation. Recent high resolution chest CT. No effusion or pneumothorax. IMPRESSION: Cardiomegaly and vascular congestion/interstitial edema. No new abnormality. Electronically Signed   By: JJorje GuildM.D.   On: 06/01/2021 06:01   DG Chest Port 1 View  Result Date: 05/31/2021 CLINICAL DATA:  86year old female with history of shortness of breath. EXAM: PORTABLE CHEST 1 VIEW COMPARISON:  Chest x-ray 05/30/2021. FINDINGS: Mild diffuse interstitial prominence and diffuse peribronchial cuffing. Lung volumes are normal. No consolidative airspace disease. No pleural effusions. No pneumothorax. Cephalization of the pulmonary vasculature. Heart size is mildly enlarged. The patient is rotated to the right on today's exam,  resulting in distortion of the mediastinal contours and reduced diagnostic sensitivity and specificity for mediastinal pathology. Atherosclerotic calcifications in the thoracic aorta. IMPRESSION: 1. The appearance  the chest suggests mild congestive heart failure, as above. Electronically Signed   By: Vinnie Langton M.D.   On: 05/31/2021 07:43   DG Shoulder Left Port  Result Date: 05/31/2021 CLINICAL DATA:  Left shoulder pain EXAM: LEFT SHOULDER COMPARISON:  None. FINDINGS: There is no evidence of fracture or dislocation. No significant arthropathy or other focal bone abnormality. Soft tissues are unremarkable. IMPRESSION: No acute osseous abnormality Electronically Signed   By: Yetta Glassman M.D.   On: 05/31/2021 10:37   VAS [place] LOWER EXTREMITY VENOUS (DVT)  Result Date: 05/31/2021  Lower Venous DVT Study Patient Name:  Meghan Wise  Date of Exam:   05/31/2021 Medical Rec #: [id number]          Accession #:    [id number] Date of Birth: [birth year]           Patient Gender: F Patient Age:   72 years Exam Location:  [facility] Procedure:      VAS [place] LOWER EXTREMITY VENOUS (DVT) Referring Phys: Loree Fee HARRIS --------------------------------------------------------------------------------  Indications: Edema.  Comparison Study: 03/11/2021 bilateral lower extremity venous duplex- age                   indeterminate DVT right popliteal vein. Performing Technologist: Maudry Mayhew MHA, RDMS, RVT, RDCS  Examination Guidelines: A complete evaluation includes B-mode imaging, spectral Doppler, color Doppler, and power Doppler as needed of all accessible portions of each vessel. Bilateral testing is considered an integral part of a complete examination. Limited examinations for reoccurring indications may be performed as noted. The reflux portion of the exam is performed with the patient in reverse Trendelenburg.  +---------+---------------+---------+-----------+----------+-----------------+  RIGHT      Compressibility Phasicity Spontaneity Properties Thrombus Aging     +---------+---------------+---------+-----------+----------+-----------------+  CFV       Full            Yes       Yes                                       +---------+---------------+---------+-----------+----------+-----------------+  SFJ       Full                                                                +---------+---------------+---------+-----------+----------+-----------------+  FV Prox   Full                                                                +---------+---------------+---------+-----------+----------+-----------------+  FV Mid    Full                                                                +---------+---------------+---------+-----------+----------+-----------------+  FV Distal Full                                                                +---------+---------------+---------+-----------+----------+-----------------+  PFV       Full                                                                +---------+---------------+---------+-----------+----------+-----------------+  POP       Partial         Yes       Yes                    Age Indeterminate  +---------+---------------+---------+-----------+----------+-----------------+  PTV       Full                                                                +---------+---------------+---------+-----------+----------+-----------------+  PERO      Full                                                                +---------+---------------+---------+-----------+----------+-----------------+   +---------+---------------+---------+-----------+----------+--------------+  LEFT      Compressibility Phasicity Spontaneity Properties Thrombus Aging  +---------+---------------+---------+-----------+----------+--------------+  CFV       Full            Yes       Yes                                     +---------+---------------+---------+-----------+----------+--------------+  SFJ       Full                                                             +---------+---------------+---------+-----------+----------+--------------+  FV Prox   Full                                                             +---------+---------------+---------+-----------+----------+--------------+  FV Mid    Full                                                             +---------+---------------+---------+-----------+----------+--------------+  FV Distal Full                                                             +---------+---------------+---------+-----------+----------+--------------+  PFV       Full                                                             +---------+---------------+---------+-----------+----------+--------------+  POP       Full            Yes       Yes                                    +---------+---------------+---------+-----------+----------+--------------+  PTV       Full                                                             +---------+---------------+---------+-----------+----------+--------------+  PERO      Full                                                             +---------+---------------+---------+-----------+----------+--------------+     Summary: RIGHT: - Findings consistent with age indeterminate deep vein thrombosis involving the right popliteal vein. - No cystic structure found in the popliteal fossa.  LEFT: - Findings appear essentially unchanged compared to previous examination. - There is no evidence of deep vein thrombosis in the lower extremity.  - No cystic structure found in the popliteal fossa.  *See table(s) above for measurements and observations. Electronically signed by Servando Snare MD on 05/31/2021 at 2:34:50 PM.    Final    ECHOCARDIOGRAM LIMITED  Result Date: 05/31/2021    ECHOCARDIOGRAM LIMITED REPORT   Patient Name:   Meghan Wise Date of Exam: 05/31/2021  Medical Rec #:  [id number]         Height:       58.0 in Accession #:    [id number]        Weight:       88.2 lb Date of Birth:  1935-06-25          BSA:          1.286 m Patient Age:    62 years          BP:           128/72 mmHg Patient Gender: F                 HR:           65 bpm. Exam Location:  Inpatient Procedure: Limited Echo, Limited Color Doppler and Cardiac Doppler Indications:    dyspnea  History:        Patient has prior history of Echocardiogram examinations, most                 recent 03/12/2021. Raynaud's. Scleroderma. Chronic kidney                 disease., Signs/Symptoms:Dyspnea; Risk Factors:Hypertension.  Sonographer:  Johny Chess RDCS Referring Phys: [place]  1. Left ventricular ejection fraction, by estimation, is 60 to 65%. The left ventricle has normal function. The left ventricle has no regional wall motion abnormalities. Left ventricular diastolic parameters are consistent with Grade II diastolic dysfunction (pseudonormalization). Elevated left atrial pressure. There is the interventricular septum is flattened in systole, consistent with right ventricular pressure overload.  2. Right ventricular systolic function is normal. There is severely elevated pulmonary artery systolic pressure. The estimated right ventricular systolic pressure is 99.7 mmHg.  3. The mitral valve is normal in structure. No evidence of mitral valve regurgitation. No evidence of mitral stenosis.  4. Tricuspid valve regurgitation is moderate to severe.  5. The aortic valve is normal in structure. Aortic valve regurgitation is mild. No aortic stenosis is present.  6. The inferior vena cava is normal in size with greater than 50% respiratory variability, suggesting right atrial pressure of 3 mmHg. FINDINGS  Left Ventricle: Left ventricular ejection fraction, by estimation, is 60 to 65%. The left ventricle has normal function. The left ventricle has no regional wall motion abnormalities.  The left ventricular internal cavity size was normal in size. There is  no left ventricular hypertrophy. The interventricular septum is flattened in systole, consistent with right ventricular pressure overload. Left ventricular diastolic parameters are consistent with Grade II diastolic dysfunction (pseudonormalization). Elevated left atrial pressure. Right Ventricle: No increase in right ventricular wall thickness. Right ventricular systolic function is normal. There is severely elevated pulmonary artery systolic pressure. The tricuspid regurgitant velocity is 4.01 m/s, and with an assumed right atrial pressure of 3 mmHg, the estimated right ventricular systolic pressure is 18.2 mmHg. Left Atrium: Left atrial size was normal in size. Right Atrium: Right atrial size was normal in size. Pericardium: There is no evidence of pericardial effusion. Mitral Valve: The mitral valve is normal in structure. There is mild thickening of the mitral valve leaflet(s). No evidence of mitral valve stenosis. Tricuspid Valve: The tricuspid valve is normal in structure. Tricuspid valve regurgitation is moderate to severe. No evidence of tricuspid stenosis. Aortic Valve: The aortic valve is normal in structure. Aortic valve regurgitation is mild. Aortic regurgitation PHT measures 503 msec. No aortic stenosis is present. Pulmonic Valve: The pulmonic valve was normal in structure. Pulmonic valve regurgitation is trivial. No evidence of pulmonic stenosis. Aorta: The aortic root is normal in size and structure. Venous: The inferior vena cava is normal in size with greater than 50% respiratory variability, suggesting right atrial pressure of 3 mmHg. IAS/Shunts: No atrial level shunt detected by color flow Doppler. LEFT VENTRICLE PLAX 2D LVIDd:         4.06 cm Diastology LVIDs:         2.18 cm LV e' medial:    5.55 cm/s LV PW:         1.10 cm LV E/e' medial:  15.2 LV IVS:        0.75 cm LV e' lateral:   5.33 cm/s                        LV  E/e' lateral: 15.9  LEFT ATRIUM         Index LA diam:    3.20 cm 2.49 cm/m  AORTIC VALVE AI PHT:      503 msec  AORTA Ao Asc diam: 3.10 cm MITRAL VALVE  TRICUSPID VALVE MV Area (PHT): 3.53 cm    TR Peak grad:   64.3 mmHg MV Decel Time: 215 msec    TR Vmax:        401.00 cm/s MV E velocity: 84.50 cm/s MV A velocity: 85.10 cm/s MV E/A ratio:  0.99 Mihai Croitoru MD Electronically signed by Sanda Klein MD Signature Date/Time: 05/31/2021/1:23:45 PM    Final      Assessment and Plan:   Atrial fibrillation: Back in NSR.  BP limits rate control meds.  Concern about LFTs makes use of amiodarone more difficult.  Renal dysfunction makes use of digoxin more difficult.  Hopefully, with blood transfusion, decrease stress on the heart will decrease incidence of atrial fibrillation.  Long-term obviously given the GI bleed, she is not a great candidate for anticoagulation. Discussed with Dr. Aundra Dubin.  Plan is for right heart cath later today to better characterize her pulmonary hypertension.  The patient is also comfortable trying to minimize any medications that she is taking.  We will continue to watch on telemetry.   Risk Assessment/Risk Scores:          CHA2DS2-VASc Score = 4   This indicates a 4.8% annual risk of stroke. The patient's score is based upon: CHF History: 1 HTN History: 0 Diabetes History: 0 Stroke History: 0 Vascular Disease History: 0 Age Score: 2 Gender Score: 1         For questions or updates, please contact [place] Please consult [email or web address] for contact info under    Signed, Larae Grooms, MD  06/01/2021 8:49 AM

## 2021-06-01 NOTE — Consult Note (Signed)
Cardiology Consultation:   Patient ID: Meghan Wise MRN: [id number]; DOB: [birth year]  Admit date: 05/30/2021 Date of Consult: 06/01/2021  PCP:  Collene Leyden, MD   [place] Providers Cardiologist:  None        Patient Profile:   Meghan Wise is a 86 y.o. female with a hx of pulmonary hypertension who is being seen 06/01/2021 for the evaluation of atrial fibrillation at the request of Dr. Hal Hope.  History of Present Illness:   [name] has a history of hypertension, hyperlipidemia, hypothyroidism and stage IIIb chronic kidney disease.  She is followed at [facility] for her pulmonary hypertension.  She has been on Eliquis for DVT/PE.  She was noted to have dark-colored stools.  She was found to be anemic with a hemoglobin less than 7.  She has no known history of atrial fibrillation.  Several hours ago, she was found to be in atrial fibrillation with rapid ventricular response.  Blood pressure limited use of AV nodal blocking agents.  Her renal function limited the use of digoxin.  Amiodarone was considered but her LFTs were increased.  She was scheduled to receive blood.  She can feel palpitations with the atrial fibrillation.  Currently, she feels well.   Past Medical History:  Diagnosis Date   Heart failure ([place])    HTN (hypertension)    Hyperlipidemia    Hypothyroidism    Kidney disease    Stage IV- Dr. Servando Salina -LOV 02-11-14   Osteoarthritis    Osteoporosis    Pulmonary hypertension (HCC)    Raynaud disease    reactive with cold and stress mostly fingers.   Scleroderma ([place])    lungs and kidneys   Shortness of breath dyspnea    with exertion-in Cardiac rehab program at [facility].    Past Surgical History:  Procedure Laterality Date   BREAST BIOPSY Bilateral    x    CARDIAC CATHETERIZATION     2'15 [facility]   CHOLECYSTECTOMY     CHOLECYSTECTOMY, LAPAROSCOPIC     COLONOSCOPY WITH PROPOFOL N/A 05/07/2014   Procedure: COLONOSCOPY WITH PROPOFOL;  Surgeon: Inda Castle, MD;  Location: [facility] ENDOSCOPY;  Service: Endoscopy;  Laterality: N/A;   ESOPHAGOGASTRODUODENOSCOPY (EGD) WITH PROPOFOL N/A 05/07/2014   Procedure: ESOPHAGOGASTRODUODENOSCOPY (EGD) WITH PROPOFOL;  Surgeon: Inda Castle, MD;  Location: [facility] ENDOSCOPY;  Service: Endoscopy;  Laterality: N/A;   HOT HEMOSTASIS N/A 05/07/2014   Procedure: HOT HEMOSTASIS (ARGON PLASMA COAGULATION/BICAP);  Surgeon: Inda Castle, MD;  Location: [facility];  Service: Endoscopy;  Laterality: N/A;  deuodenal bulb   SHOULDER ARTHROSCOPY W/ ROTATOR CUFF REPAIR Right        Inpatient Medications: Scheduled Meds:  sodium chloride   Intravenous Once   ambrisentan  10 mg Oral Daily   amiodarone  150 mg Intravenous Once   amLODipine  5 mg Oral Daily   atorvastatin  20 mg Oral Daily   ferrous sulfate  325 mg Oral Daily   folic acid  292 mcg Oral QPM   levothyroxine  50 mcg Oral Q0600   metoprolol tartrate       pantoprazole (PROTONIX) IV  40 mg Intravenous Q12H   predniSONE  5 mg Oral Q breakfast   tadalafil  40 mg Oral QPM   torsemide  10 mg Oral Daily   vitamin B-12  1,000 mcg Oral Daily   Continuous Infusions:  sodium chloride 10 mL/hr at 06/01/21 0847   amiodarone     Followed by  amiodarone     PRN Meds: acetaminophen **OR** acetaminophen, melatonin, morphine injection, traMADol  Allergies:    Allergies  Allergen Reactions   Losartan Other (See Comments) and Cough    High calcium count and renal problems   Nsaids     Other reaction(s): Kidney Disorder   Ace Inhibitors Other (See Comments) and Cough    Dizziness and coughing    Sulfa Antibiotics Hives   Codeine Nausea Only   Heparin Anxiety and Other (See Comments)    Pt reports that they have a sensitivity towards heparin. Pt becomes depressed and anxious to the point of crying uncontrollably.     Social History:   Social History   Socioeconomic History   Marital status: Widowed    Spouse name: Not on file   Number of children:  5   Years of education: Not on file   Highest education level: Not on file  Occupational History   Occupation: Retired    Comment: Library Asst.  Tobacco Use   Smoking status: Former    Packs/day: 1.50    Years: 60.00    Pack years: 90.00    Types: Cigarettes    Quit date: 04/25/1987    Years since quitting: 34.1   Smokeless tobacco: Never  Vaping Use   Vaping Use: Never used  Substance and Sexual Activity   Alcohol use: Yes    Alcohol/week: 0.0 standard drinks    Comment: OCCASIONALLY- rare   Drug use: No   Sexual activity: Not on file  Other Topics Concern   Not on file  Social History Narrative   Not on file   Social Determinants of Health   Financial Resource Strain: Not on file  Food Insecurity: Not on file  Transportation Needs: Not on file  Physical Activity: Not on file  Stress: Not on file  Social Connections: Not on file  Intimate Partner Violence: Not on file    Family History:    Family History  Problem Relation Age of Onset   Heart disease Mother    Heart disease Father    Cancer Other        husband--esophageal   Diabetes Son    Stroke Daughter    Breast cancer Neg Hx      ROS:  Please see the history of present illness.  Palpitations with the atrial fibrillation All other ROS reviewed and negative.     Physical Exam/Data:   Vitals:   06/01/21 0716 06/01/21 0736 06/01/21 0749 06/01/21 0754  BP: 94/75 103/68  (!) 104/56  Pulse:  (!) 134 75 68  Resp: (!) 22 (!) 22 (!) 25 19  Temp:  98.1 F (36.7 C)    TempSrc:  Oral    SpO2:  98% 98% 99%  Weight:      Height:       No intake or output data in the 24 hours ending 06/01/21 0849 Last 3 Weights 05/30/2021 05/27/2021 03/23/2021  Weight (lbs) 88 lb 2.9 oz 89 lb 6.4 oz 85 lb 6.4 oz  Weight (kg) 40 kg 40.552 kg 38.737 kg     Body mass index is 18.43 kg/m.  General:  Well nourished, well developed, in no acute distress, frail HEENT: normal Neck: no JVD Vascular: No carotid bruits; Distal  pulses 2+ bilaterally Cardiac:  normal S1, S2; RRR; 2 out of 6 systolic murmur, RV heave Lungs:  clear to auscultation bilaterally, no wheezing, rhonchi or rales  Abd: soft, nontender, no hepatomegaly  Ext:  tr LE edema Musculoskeletal:  No deformities, BUE and BLE strength normal and equal Skin: warm and dry  Neuro:  CNs 2-12 intact, no focal abnormalities noted Psych:  Normal affect   EKG:  The EKG was personally reviewed and demonstrates: Currently normal sinus rhythm Telemetry:  Telemetry was personally reviewed and demonstrates: Atrial fibrillation with rapid ventricular response, lateral ST depression  Relevant CV Studies: Normal LV function/RV function.  Elevated RV systolic pressure.  Significant tricuspid regurgitation  Laboratory Data:  High Sensitivity Troponin:   Recent Labs  Lab 05/31/21 0920 06/01/21 0622  TROPONINIHS 24* 27*     Chemistry Recent Labs  Lab 05/30/21 1931 05/30/21 2213 05/31/21 0257 06/01/21 0133  NA 143 142 145 137  K 3.9 3.6 4.1 4.0  CL 107 107 110 103  CO2 23  --  24 22  GLUCOSE 184* 128* 90 97  BUN 48* 46* 46* 41*  CREATININE 1.65* 1.50* 1.52* 1.69*  CALCIUM 8.1*  --  8.0* 7.3*  MG  --   --  2.4 2.3  GFRNONAA 31*  --  34* 30*  ANIONGAP 13  --  11 12    Recent Labs  Lab 05/30/21 1931 06/01/21 0133  PROT 6.5 5.6*  ALBUMIN 3.0* 2.5*  AST 28 359*  ALT 26 242*  ALKPHOS 62 210*  BILITOT 0.3 0.8   Lipids No results for input(s): CHOL, TRIG, HDL, LABVLDL, LDLCALC, CHOLHDL in the last 168 hours.  Hematology Recent Labs  Lab 05/31/21 0920 05/31/21 1937 06/01/21 0133  WBC 12.6* 12.5* 9.9  RBC 2.43* 2.75* 2.44*  HGB 7.4* 8.1* 7.3*  HCT 23.2* 26.1* 23.1*  MCV 95.5 94.9 94.7  MCH 30.5 29.5 29.9  MCHC 31.9 31.0 31.6  RDW 14.1 14.5 14.6  PLT 363 382 339   Thyroid  Recent Labs  Lab 06/01/21 0622  TSH 3.596    BNP Recent Labs  Lab 05/30/21 1931 05/31/21 0257 06/01/21 0133  BNP 562.0* 785.4* 1,227.1*    DDimer No  results for input(s): DDIMER in the last 168 hours.   Radiology/Studies:  DG Chest 2 View  Result Date: 05/30/2021 CLINICAL DATA:  Shortness of breath. EXAM: CHEST - 2 VIEW COMPARISON:  Chest x-ray 05/20/2021.  Chest CT 12/24/2020. FINDINGS: Scarring is again seen in the bilateral lung apices. There is a stable calcified granuloma in the left lung apex. There is no new focal lung infiltrate, pleural effusion or pneumothorax identified. Cardiomediastinal silhouette is within normal limits. No acute fractures are seen. IMPRESSION: No evidence for pneumonia or edema. Stable chronic changes in the lung apices. Electronically Signed   By: Ronney Asters M.D.   On: 05/30/2021 21:31   CT Chest High Resolution  Result Date: 05/31/2021 CLINICAL DATA:  86 year old female with history of hypoxemia. EXAM: CT CHEST WITHOUT CONTRAST TECHNIQUE: Multidetector CT imaging of the chest was performed following the standard protocol without intravenous contrast. High resolution imaging of the lungs, as well as inspiratory and expiratory imaging, was performed. RADIATION DOSE REDUCTION: This exam was performed according to the departmental dose-optimization program which includes automated exposure control, adjustment of the mA and/or kV according to patient size and/or use of iterative reconstruction technique. COMPARISON:  Chest CT 12/24/2020. FINDINGS: Cardiovascular: Heart size is mildly enlarged. There is no significant pericardial fluid, thickening or pericardial calcification. There is aortic atherosclerosis, as well as atherosclerosis of the great vessels of the mediastinum and the coronary arteries, including calcified atherosclerotic plaque in the left main, left anterior descending, left  circumflex and right coronary arteries. Calcifications of the aortic valve. Mediastinum/Nodes: No pathologically enlarged mediastinal or hilar lymph nodes. Please note that accurate exclusion of hilar adenopathy is limited on  noncontrast CT scans. Esophagus is unremarkable in appearance. No axillary lymphadenopathy. Lungs/Pleura: Study is limited by considerable patient respiratory motion. With these limitations in mind, high-resolution images demonstrates some patchy areas of mild ground-glass attenuation and predominantly interlobular septal thickening throughout the lungs bilaterally, favored to reflect a background of interstitial pulmonary edema. No definitive regions of traction bronchiectasis or honeycombing are noted. There is some bilateral apical pleuroparenchymal thickening and architectural distortion, most compatible with areas of chronic post infectious or inflammatory scarring. No confluent consolidative airspace disease. No pleural effusions. No definite suspicious appearing pulmonary nodules or masses are noted. Inspiratory and expiratory imaging is considered nearly nondiagnostic, but suggests the presence of some mild air trapping. Upper Abdomen: Aortic atherosclerosis. Musculoskeletal: There are no aggressive appearing lytic or blastic lesions noted in the visualized portions of the skeleton. IMPRESSION: 1. Limited study demonstrating no definitive evidence to suggest interstitial lung disease. 2. There is cardiomegaly with evidence of interstitial pulmonary edema; imaging findings suggestive of congestive heart failure, as above. 3. Aortic atherosclerosis, in addition to left main and three-vessel coronary artery disease. 4. There are calcifications of the aortic valve. Echocardiographic correlation for evaluation of potential valvular dysfunction may be warranted if clinically indicated. 5. Mild air trapping, suggesting small airways disease. Aortic Atherosclerosis (ICD10-I70.0). Electronically Signed   By: Vinnie Langton M.D.   On: 05/31/2021 04:47   NM Pulmonary Perf and Vent  Result Date: 05/31/2021 CLINICAL DATA:  Back pain, chest pain, shortness of breath EXAM: NUCLEAR MEDICINE PERFUSION LUNG SCAN  TECHNIQUE: Perfusion images were obtained in multiple projections after intravenous injection of radiopharmaceutical. Ventilation scans intentionally deferred if perfusion scan and chest x-ray adequate for interpretation during COVID 19 epidemic. RADIOPHARMACEUTICALS:  Four mCi Tc-14mMAA IV COMPARISON:  Chest radiograph done earlier today FINDINGS: There is small linear wedge-shaped area of decreased tracer uptake in the posterior right upper lung fields. No other focal abnormality is seen. Evaluation is limited without ventilation images. IMPRESSION: There is small linear wedge shaped area of decreased uptake in the posterior right upper lung fields. Study is indeterminate to evaluate for pulmonary embolism. If there is continued clinical suspicion for pulmonary embolism CT pulmonary angiogram and possibly venous Doppler examination of lower extremities may be considered. Electronically Signed   By: PElmer PickerM.D.   On: 05/31/2021 16:06   DG CHEST PORT 1 VIEW  Result Date: 06/01/2021 CLINICAL DATA:  Shortness of breath and tachycardia EXAM: PORTABLE CHEST 1 VIEW COMPARISON:  Yesterday FINDINGS: Cardiomegaly and vascular pedicle widening. Chronic interstitial coarsening and reticulation. Recent high resolution chest CT. No effusion or pneumothorax. IMPRESSION: Cardiomegaly and vascular congestion/interstitial edema. No new abnormality. Electronically Signed   By: JJorje GuildM.D.   On: 06/01/2021 06:01   DG Chest Port 1 View  Result Date: 05/31/2021 CLINICAL DATA:  86year old female with history of shortness of breath. EXAM: PORTABLE CHEST 1 VIEW COMPARISON:  Chest x-ray 05/30/2021. FINDINGS: Mild diffuse interstitial prominence and diffuse peribronchial cuffing. Lung volumes are normal. No consolidative airspace disease. No pleural effusions. No pneumothorax. Cephalization of the pulmonary vasculature. Heart size is mildly enlarged. The patient is rotated to the right on today's exam,  resulting in distortion of the mediastinal contours and reduced diagnostic sensitivity and specificity for mediastinal pathology. Atherosclerotic calcifications in the thoracic aorta. IMPRESSION: 1. The appearance  the chest suggests mild congestive heart failure, as above. Electronically Signed   By: Vinnie Langton M.D.   On: 05/31/2021 07:43   DG Shoulder Left Port  Result Date: 05/31/2021 CLINICAL DATA:  Left shoulder pain EXAM: LEFT SHOULDER COMPARISON:  None. FINDINGS: There is no evidence of fracture or dislocation. No significant arthropathy or other focal bone abnormality. Soft tissues are unremarkable. IMPRESSION: No acute osseous abnormality Electronically Signed   By: Yetta Glassman M.D.   On: 05/31/2021 10:37   VAS [place] LOWER EXTREMITY VENOUS (DVT)  Result Date: 05/31/2021  Lower Venous DVT Study Patient Name:  Meghan Wise  Date of Exam:   05/31/2021 Medical Rec #: [id number]          Accession #:    [id number] Date of Birth: [birth year]           Patient Gender: F Patient Age:   72 years Exam Location:  [facility] Procedure:      VAS [place] LOWER EXTREMITY VENOUS (DVT) Referring Phys: Loree Fee HARRIS --------------------------------------------------------------------------------  Indications: Edema.  Comparison Study: 03/11/2021 bilateral lower extremity venous duplex- age                   indeterminate DVT right popliteal vein. Performing Technologist: Maudry Mayhew MHA, RDMS, RVT, RDCS  Examination Guidelines: A complete evaluation includes B-mode imaging, spectral Doppler, color Doppler, and power Doppler as needed of all accessible portions of each vessel. Bilateral testing is considered an integral part of a complete examination. Limited examinations for reoccurring indications may be performed as noted. The reflux portion of the exam is performed with the patient in reverse Trendelenburg.  +---------+---------------+---------+-----------+----------+-----------------+  RIGHT      Compressibility Phasicity Spontaneity Properties Thrombus Aging     +---------+---------------+---------+-----------+----------+-----------------+  CFV       Full            Yes       Yes                                       +---------+---------------+---------+-----------+----------+-----------------+  SFJ       Full                                                                +---------+---------------+---------+-----------+----------+-----------------+  FV Prox   Full                                                                +---------+---------------+---------+-----------+----------+-----------------+  FV Mid    Full                                                                +---------+---------------+---------+-----------+----------+-----------------+  FV Distal Full                                                                +---------+---------------+---------+-----------+----------+-----------------+  PFV       Full                                                                +---------+---------------+---------+-----------+----------+-----------------+  POP       Partial         Yes       Yes                    Age Indeterminate  +---------+---------------+---------+-----------+----------+-----------------+  PTV       Full                                                                +---------+---------------+---------+-----------+----------+-----------------+  PERO      Full                                                                +---------+---------------+---------+-----------+----------+-----------------+   +---------+---------------+---------+-----------+----------+--------------+  LEFT      Compressibility Phasicity Spontaneity Properties Thrombus Aging  +---------+---------------+---------+-----------+----------+--------------+  CFV       Full            Yes       Yes                                     +---------+---------------+---------+-----------+----------+--------------+  SFJ       Full                                                             +---------+---------------+---------+-----------+----------+--------------+  FV Prox   Full                                                             +---------+---------------+---------+-----------+----------+--------------+  FV Mid    Full                                                             +---------+---------------+---------+-----------+----------+--------------+  FV Distal Full                                                             +---------+---------------+---------+-----------+----------+--------------+  PFV       Full                                                             +---------+---------------+---------+-----------+----------+--------------+  POP       Full            Yes       Yes                                    +---------+---------------+---------+-----------+----------+--------------+  PTV       Full                                                             +---------+---------------+---------+-----------+----------+--------------+  PERO      Full                                                             +---------+---------------+---------+-----------+----------+--------------+     Summary: RIGHT: - Findings consistent with age indeterminate deep vein thrombosis involving the right popliteal vein. - No cystic structure found in the popliteal fossa.  LEFT: - Findings appear essentially unchanged compared to previous examination. - There is no evidence of deep vein thrombosis in the lower extremity.  - No cystic structure found in the popliteal fossa.  *See table(s) above for measurements and observations. Electronically signed by Servando Snare MD on 05/31/2021 at 2:34:50 PM.    Final    ECHOCARDIOGRAM LIMITED  Result Date: 05/31/2021    ECHOCARDIOGRAM LIMITED REPORT   Patient Name:   Meghan Wise Date of Exam: 05/31/2021  Medical Rec #:  [id number]         Height:       58.0 in Accession #:    [id number]        Weight:       88.2 lb Date of Birth:  1935-06-25          BSA:          1.286 m Patient Age:    62 years          BP:           128/72 mmHg Patient Gender: F                 HR:           65 bpm. Exam Location:  Inpatient Procedure: Limited Echo, Limited Color Doppler and Cardiac Doppler Indications:    dyspnea  History:        Patient has prior history of Echocardiogram examinations, most                 recent 03/12/2021. Raynaud's. Scleroderma. Chronic kidney                 disease., Signs/Symptoms:Dyspnea; Risk Factors:Hypertension.  Sonographer:  Johny Chess RDCS Referring Phys: [place]  1. Left ventricular ejection fraction, by estimation, is 60 to 65%. The left ventricle has normal function. The left ventricle has no regional wall motion abnormalities. Left ventricular diastolic parameters are consistent with Grade II diastolic dysfunction (pseudonormalization). Elevated left atrial pressure. There is the interventricular septum is flattened in systole, consistent with right ventricular pressure overload.  2. Right ventricular systolic function is normal. There is severely elevated pulmonary artery systolic pressure. The estimated right ventricular systolic pressure is 99.7 mmHg.  3. The mitral valve is normal in structure. No evidence of mitral valve regurgitation. No evidence of mitral stenosis.  4. Tricuspid valve regurgitation is moderate to severe.  5. The aortic valve is normal in structure. Aortic valve regurgitation is mild. No aortic stenosis is present.  6. The inferior vena cava is normal in size with greater than 50% respiratory variability, suggesting right atrial pressure of 3 mmHg. FINDINGS  Left Ventricle: Left ventricular ejection fraction, by estimation, is 60 to 65%. The left ventricle has normal function. The left ventricle has no regional wall motion abnormalities.  The left ventricular internal cavity size was normal in size. There is  no left ventricular hypertrophy. The interventricular septum is flattened in systole, consistent with right ventricular pressure overload. Left ventricular diastolic parameters are consistent with Grade II diastolic dysfunction (pseudonormalization). Elevated left atrial pressure. Right Ventricle: No increase in right ventricular wall thickness. Right ventricular systolic function is normal. There is severely elevated pulmonary artery systolic pressure. The tricuspid regurgitant velocity is 4.01 m/s, and with an assumed right atrial pressure of 3 mmHg, the estimated right ventricular systolic pressure is 18.2 mmHg. Left Atrium: Left atrial size was normal in size. Right Atrium: Right atrial size was normal in size. Pericardium: There is no evidence of pericardial effusion. Mitral Valve: The mitral valve is normal in structure. There is mild thickening of the mitral valve leaflet(s). No evidence of mitral valve stenosis. Tricuspid Valve: The tricuspid valve is normal in structure. Tricuspid valve regurgitation is moderate to severe. No evidence of tricuspid stenosis. Aortic Valve: The aortic valve is normal in structure. Aortic valve regurgitation is mild. Aortic regurgitation PHT measures 503 msec. No aortic stenosis is present. Pulmonic Valve: The pulmonic valve was normal in structure. Pulmonic valve regurgitation is trivial. No evidence of pulmonic stenosis. Aorta: The aortic root is normal in size and structure. Venous: The inferior vena cava is normal in size with greater than 50% respiratory variability, suggesting right atrial pressure of 3 mmHg. IAS/Shunts: No atrial level shunt detected by color flow Doppler. LEFT VENTRICLE PLAX 2D LVIDd:         4.06 cm Diastology LVIDs:         2.18 cm LV e' medial:    5.55 cm/s LV PW:         1.10 cm LV E/e' medial:  15.2 LV IVS:        0.75 cm LV e' lateral:   5.33 cm/s                        LV  E/e' lateral: 15.9  LEFT ATRIUM         Index LA diam:    3.20 cm 2.49 cm/m  AORTIC VALVE AI PHT:      503 msec  AORTA Ao Asc diam: 3.10 cm MITRAL VALVE  TRICUSPID VALVE MV Area (PHT): 3.53 cm    TR Peak grad:   64.3 mmHg MV Decel Time: 215 msec    TR Vmax:        401.00 cm/s MV E velocity: 84.50 cm/s MV A velocity: 85.10 cm/s MV E/A ratio:  0.99 Mihai Croitoru MD Electronically signed by Sanda Klein MD Signature Date/Time: 05/31/2021/1:23:45 PM    Final      Assessment and Plan:   Atrial fibrillation: Back in NSR.  BP limits rate control meds.  Concern about LFTs makes use of amiodarone more difficult.  Renal dysfunction makes use of digoxin more difficult.  Hopefully, with blood transfusion, decrease stress on the heart will decrease incidence of atrial fibrillation.  Long-term obviously given the GI bleed, she is not a great candidate for anticoagulation. Discussed with Dr. Aundra Dubin.  Plan is for right heart cath later today to better characterize her pulmonary hypertension.  The patient is also comfortable trying to minimize any medications that she is taking.  We will continue to watch on telemetry.   Risk Assessment/Risk Scores:          CHA2DS2-VASc Score = 4   This indicates a 4.8% annual risk of stroke. The patient's score is based upon: CHF History: 1 HTN History: 0 Diabetes History: 0 Stroke History: 0 Vascular Disease History: 0 Age Score: 2 Gender Score: 1         For questions or updates, please contact [place] Please consult [email or web address] for contact info under    Signed, Larae Grooms, MD  06/01/2021 8:49 AM

## 2021-06-01 NOTE — Consult Note (Addendum)
Advanced Heart Failure Team Consult Note   Primary Physician: Collene Leyden, MD PCP-Cardiologist:  None PAH: Dr Daine Gravel, Rob Hickman   Reason for Consultation: Pulmonary Hypertension  HPI:    Meghan Wise is seen today for evaluation of pulmonary hypertension at the request of  Dr Waldron Labs.   [name] is a a 86 year old with a history of PAH,  scleroderma, CTD, chronic respiratory failure on 4-5 liters oxygen, CKD Stage IV, hypothyroidism, AVMs duodenum, GERD, DVT RLE, and PE.   Followed at [facility] for [place] hypertension.   PFT 08/12/12: TLC 108% pred, FVC 105% pred, FEV1 114% pred, FEV1/FVC 73%, FEF25-75% 59% pred, DLCO 48% pred. 2. TTE 09/26/12: LVEF 60-65%, grade 1 LVDD. Mild AR. Trivial MR. Mild biatrial enlargement. RV mildly enlarged. RVSP estimated at 44 mmHg. IVC normal in size with normal respiratory collapse. 3. CT chest 12/12/12: Bilateral irregular pulmonary nodules are all stable. Interstitial thickening most evident at the lung bases. Areas of coarse reticular scarring mostly in anterior upper l9obes and at apices are stable. Borderline mediastinal adenopathy.  4. TTE 05/05/13: LVEF > 55%, grade 1 LVDD, septal flattening. Severely enlarged RV, moderate dysfunction, RA moderately enlarged, IVC normal in size with abnormal respiratory collapse. Moderate TR, TRjet 4.1 m/s, RVSP 75 mmHg. 5. RHC 05/29/13: RA 13, RV 68/15, PA 72/28 (mean 45), PCWP 7, CO 3.5, CI 2.4, PVR 9.5 WU. No significant response to nitric oxide. 6. CT chest 02/23/15 [facility]): 1. Overall the appearance the chest is very similar to the prior examination, with slight progression of disease, as detailed above. Given the mid to upper lung predominance of the nodules and apparent perilymphatic distribution, these findings, in association with some chronic mediastinal and hilar lymphadenopathy is most favored to represent sarcoidosis. Clinical correlation is recommended. No imaging findings to suggest interstitial  lung disease at this Time. Air trapping, indicative of small airways disease. Atherosclerosis, including three-vessel coronary artery disease. Assessment for potential risk factor modification, dietary therapy or pharmacologic therapy may be warranted, if clinically indicated. - RHC 01/11/18: RA 8, RV 72/5, PA 72/30 (mean 42), PCWP 14, AVO2 diff 3.3 vol%, CO 4.7, CI 3.4, PVR 6.0 WU. - CT ILD protocol 01/11/18: Redemonstrated basilar predominant groundglass and septal thickening with scattered irregular pulmonary nodules which are stable to minimally progressed in the interval. Findings have been present over several years with only mild progression and are favored to be secondary to patient's underlying connective tissue disease. Stable hilar and mediastinal lymphadenopathy which is also favored to be secondary to underlying autoimmune process. New minimal left lower lobe groundglass opacities which may represent superimposed aspiration.  On 05/30/21 she was seen at [facility] follow up. Progressive shortness of breath. Hgb 6.8 with recommendations for admit however she wanted to come back to [facility].    Current PAH regimen- adcirca + letaris. Intolerant tyvaso due to side effects.   Presented to ED 2/7 with worsening shortness of breath and melena.  Hgb 7.4. FOBT +. GI consulted. Eliquis held. Pulmonary consulted. Recommended RHC to further assess hemodynamics. Echo repeated.  EF 60-65%, grade II DD, RV normal , RVSP 67  Earlier today went  in A fib RVR. Given 5 mg metoprolol. Converted to NSR. Cardiology consulted. Plan to start amio drip. Hgb 7.3 today --> given 1UPRBCs. Lower extremity doppler with RLE DVT, indeterminate age.    Review of Systems: [y] = yes, _0  = no   General: Weight gain _1 ; Weight loss _2 ;  Anorexia _0 ; Fatigue [ Y]; Fever _1 ; Chills _2 ; Weakness [Y ]  Cardiac: Chest pain/pressure _3 ; Resting SOB _4 ; Exertional SOB [ Y]; Orthopnea _5 ; Pedal Edema _6 ; Palpitations [  ]; Syncope _7 ; Presyncope _8 ; Paroxysmal nocturnal dyspnea_9   Pulmonary: Cough _10 ; Wheezing_11 ; Hemoptysis_12 ; Sputum _13 ; Snoring _14   GI: Vomiting_15 ; Dysphagia_16 ; Melena_17 ; Hematochezia _18 ; Heartburn_19 ; Abdominal pain _20 ; Constipation _21 ; Diarrhea _22 ; BRBPR _23   GU: Hematuria_24 ; Dysuria _25 ; Nocturia_26   Vascular: Pain in legs with walking _27 ; Pain in feet with lying flat _28 ; Non-healing sores _29 ; Stroke _30 ; TIA _31 ; Slurred speech _32 ;  Neuro: Headaches_33 ; Vertigo_34 ; Seizures_35 ; Paresthesias_36 ;Blurred vision _37 ; Diplopia _38 ; Vision changes _39   Ortho/Skin: Arthritis _40 ; Joint pain _41 ; Muscle pain _42 ; Joint swelling [ Y]; Back Pain [Y ]; Rash _43   Psych: Depression_44 ; Anxiety_45   Heme: Bleeding problems _46 ; Clotting disorders _47 ; Anemia [Y ]  Endocrine: Diabetes _48 ; Thyroid dysfunction[Y ]  Home Medications Prior to Admission medications   Medication Sig Start Date End Date Taking? Authorizing Provider  Acetaminophen 500 MG capsule Take 1,000 mg by mouth every 6 (six) hours as needed for fever.   Yes [provider]  ambrisentan (LETAIRIS) 10 MG tablet Take 10 mg by mouth daily.    Yes [provider]  amLODipine (NORVASC) 5 MG tablet Take by mouth. 04/22/21  Yes [provider]  apixaban (ELIQUIS) 2.5 MG TABS tablet Take 1 tablet (2.5 mg total) by mouth 2 (two) times daily. 05/12/21  Yes Mannam, Praveen, MD  atorvastatin (LIPITOR) 20 MG tablet Take 20 mg by mouth daily.   Yes [provider]  Cyanocobalamin (VITAMIN B-12 ER PO) Take 1 tablet by mouth daily.   Yes [provider]  denosumab (PROLIA) 60 MG/ML SOSY injection Inject 60 mg into the skin every 6 (six) months.   Yes [provider]  folic acid (FOLVITE) 893 MCG tablet Take 400 mcg by mouth every evening.    Yes [provider]  levothyroxine (SYNTHROID, LEVOTHROID) 50 MCG tablet Take 50 mcg by mouth every morning.   Yes [provider]  loperamide (IMODIUM A-D) 2 MG tablet Take 4 mg by mouth 4 (four) times daily as needed for diarrhea or loose stools.    Yes [provider]  Melatonin 10 MG TABS Take 10 mg by mouth daily as needed (sleep).    Yes [provider]  metroNIDAZOLE (FLAGYL) 500 MG tablet Take 500 mg by mouth 2 (two) times daily.   Yes [provider]  omeprazole (PRILOSEC) 20 MG capsule Take 20 mg by mouth daily.   Yes [provider]  OXYGEN Inhale 2-4 L into the lungs continuous. 4 L with exertion 2 L at night   Yes [provider]  predniSONE (DELTASONE) 5 MG tablet Take 5 mg by mouth daily with breakfast.   Yes [provider]  tadalafil (ADCIRCA/CIALIS) 20 MG tablet Take 40 mg by mouth every evening.    Yes [provider]  torsemide (DEMADEX) 5 MG tablet Take 1 tablet (5 mg total) by mouth daily. Patient taking differently: Take 10 mg by mouth daily. 03/23/21  Yes Mannam, Praveen, MD  ferrous sulfate 324 MG TBEC Take 324 mg by mouth daily. Patient  not taking: Reported on 05/30/2021    [provider]    Past Medical History: Past Medical History:  Diagnosis Date   Heart failure ([place])    HTN (hypertension)    Hyperlipidemia    Hypothyroidism    Kidney disease    Stage IV- Dr. Servando Salina -LOV 02-11-14   Osteoarthritis    Osteoporosis    Pulmonary hypertension ([place])    Raynaud disease    reactive with cold and stress mostly fingers.   Scleroderma ([place])    lungs and kidneys   Shortness of breath dyspnea    with exertion-in Cardiac rehab program at [facility].    Past Surgical History: Past Surgical History:  Procedure Laterality Date   BREAST BIOPSY Bilateral    x    CARDIAC CATHETERIZATION     2'15 [facility]   CHOLECYSTECTOMY     CHOLECYSTECTOMY, LAPAROSCOPIC     COLONOSCOPY WITH PROPOFOL N/A 05/07/2014   Procedure: COLONOSCOPY WITH PROPOFOL;  Surgeon: Inda Castle, MD;  Location: [facility] ENDOSCOPY;  Service: Endoscopy;   Laterality: N/A;   ESOPHAGOGASTRODUODENOSCOPY (EGD) WITH PROPOFOL N/A 05/07/2014   Procedure: ESOPHAGOGASTRODUODENOSCOPY (EGD) WITH PROPOFOL;  Surgeon: Inda Castle, MD;  Location: [facility] ENDOSCOPY;  Service: Endoscopy;  Laterality: N/A;   HOT HEMOSTASIS N/A 05/07/2014   Procedure: HOT HEMOSTASIS (ARGON PLASMA COAGULATION/BICAP);  Surgeon: Inda Castle, MD;  Location: [facility];  Service: Endoscopy;  Laterality: N/A;  deuodenal bulb   SHOULDER ARTHROSCOPY W/ ROTATOR CUFF REPAIR Right     Family History: Family History  Problem Relation Age of Onset   Heart disease Mother    Heart disease Father    Cancer Other        husband--esophageal   Diabetes Son    Stroke Daughter    Breast cancer Neg Hx     Social History: Social History   Socioeconomic History   Marital status: Widowed    Spouse name: Not on file   Number of children: 5   Years of education: Not on file   Highest education level: Not on file  Occupational History   Occupation: Retired    Comment: Library Asst.  Tobacco Use   Smoking status: Former    Packs/day: 1.50    Years: 60.00    Pack years: 90.00    Types: Cigarettes    Quit date: 04/25/1987    Years since quitting: 34.1   Smokeless tobacco: Never  Vaping Use   Vaping Use: Never used  Substance and Sexual Activity   Alcohol use: Yes    Alcohol/week: 0.0 standard drinks    Comment: OCCASIONALLY- rare   Drug use: No   Sexual activity: Not on file  Other Topics Concern   Not on file  Social History Narrative   Not on file   Social Determinants of Health   Financial Resource Strain: Not on file  Food Insecurity: Not on file  Transportation Needs: Not on file  Physical Activity: Not on file  Stress: Not on file  Social Connections: Not on file    Allergies:  Allergies  Allergen Reactions   Losartan Other (See Comments) and Cough    High calcium count and renal problems   Nsaids     Other reaction(s): Kidney Disorder   Ace Inhibitors  Other (See Comments) and Cough    Dizziness and coughing    Sulfa Antibiotics Hives   Codeine Nausea Only   Heparin Anxiety and Other (See Comments)    Pt reports that  they have a sensitivity towards heparin. Pt becomes depressed and anxious to the point of crying uncontrollably.     Objective:    Vital Signs:   Temp:  [97.8 F (36.6 C)-99.1 F (37.3 C)] 98.1 F (36.7 C) (02/08 0736) Pulse Rate:  [20-138] 68 (02/08 0754) Resp:  [17-36] 19 (02/08 0754) BP: (85-137)/(49-77) 104/56 (02/08 0754) SpO2:  [91 %-100 %] 99 % (02/08 0754)    Weight change: Filed Weights   05/30/21 1908  Weight: 40 kg    Intake/Output:  No intake or output data in the 24 hours ending 06/01/21 0857    Physical Exam    General:  Thin, frail. No resp difficulty HEENT: normal Neck: supple. JVP 7-8 Carotids 2+ bilat; no bruits. No lymphadenopathy or thyromegaly appreciated. Cor: PMI nondisplaced. Regular rate & rhythm. No rubs, gallops or murmurs. Lungs: clear on 5 Abdomen: soft, nontender, nondistended. No hepatosplenomegaly. No bruits or masses. Good bowel sounds. Extremities: no cyanosis, clubbing, rash, edema Neuro: alert & orientedx3, cranial nerves grossly intact. moves all 4 extremities w/o difficulty. Affect pleasant   Telemetry   Afib RVR -->SR 60-70s   EKG    EKG this am A fib RVR 144 bpm  Labs   Basic Metabolic Panel: Recent Labs  Lab 05/30/21 1931 05/30/21 2213 05/31/21 0257 06/01/21 0133  NA 143 142 145 137  K 3.9 3.6 4.1 4.0  CL 107 107 110 103  CO2 23  --  24 22  GLUCOSE 184* 128* 90 97  BUN 48* 46* 46* 41*  CREATININE 1.65* 1.50* 1.52* 1.69*  CALCIUM 8.1*  --  8.0* 7.3*  MG  --   --  2.4 2.3    Liver Function Tests: Recent Labs  Lab 05/30/21 1931 06/01/21 0133  AST 28 359*  ALT 26 242*  ALKPHOS 62 210*  BILITOT 0.3 0.8  PROT 6.5 5.6*  ALBUMIN 3.0* 2.5*   No results for input(s): LIPASE, AMYLASE in the last 168 hours. No results for input(s): AMMONIA  in the last 168 hours.  CBC: Recent Labs  Lab 05/30/21 1931 05/30/21 2213 05/31/21 0257 05/31/21 0920 05/31/21 1937 06/01/21 0133  WBC 11.2*  --  10.5 12.6* 12.5* 9.9  NEUTROABS 9.8*  --   --   --   --  8.1*  HGB 7.4* 7.5* 8.9* 7.4* 8.1* 7.3*  HCT 23.8* 22.0* 28.3* 23.2* 26.1* 23.1*  MCV 96.7  --  96.9 95.5 94.9 94.7  PLT 445*  --  412* 363 382 339    Cardiac Enzymes: No results for input(s): CKTOTAL, CKMB, CKMBINDEX, TROPONINI in the last 168 hours.  BNP: BNP (last 3 results) Recent Labs    05/30/21 1931 05/31/21 0257 06/01/21 0133  BNP 562.0* 785.4* 1,227.1*    ProBNP (last 3 results) No results for input(s): PROBNP in the last 8760 hours.   CBG: No results for input(s): GLUCAP in the last 168 hours.  Coagulation Studies: No results for input(s): LABPROT, INR in the last 72 hours.   Imaging   NM Pulmonary Perf and Vent  Result Date: 05/31/2021 CLINICAL DATA:  Back pain, chest pain, shortness of breath EXAM: NUCLEAR MEDICINE PERFUSION LUNG SCAN TECHNIQUE: Perfusion images were obtained in multiple projections after intravenous injection of radiopharmaceutical. Ventilation scans intentionally deferred if perfusion scan and chest x-ray adequate for interpretation during COVID 19 epidemic. RADIOPHARMACEUTICALS:  Four mCi Tc-22mMAA IV COMPARISON:  Chest radiograph done earlier today FINDINGS: There is small linear wedge-shaped area of decreased tracer uptake  in the posterior right upper lung fields. No other focal abnormality is seen. Evaluation is limited without ventilation images. IMPRESSION: There is small linear wedge shaped area of decreased uptake in the posterior right upper lung fields. Study is indeterminate to evaluate for pulmonary embolism. If there is continued clinical suspicion for pulmonary embolism CT pulmonary angiogram and possibly venous Doppler examination of lower extremities may be considered. Electronically Signed   By: Elmer Picker M.D.    On: 05/31/2021 16:06   DG CHEST PORT 1 VIEW  Result Date: 06/01/2021 CLINICAL DATA:  Shortness of breath and tachycardia EXAM: PORTABLE CHEST 1 VIEW COMPARISON:  Yesterday FINDINGS: Cardiomegaly and vascular pedicle widening. Chronic interstitial coarsening and reticulation. Recent high resolution chest CT. No effusion or pneumothorax. IMPRESSION: Cardiomegaly and vascular congestion/interstitial edema. No new abnormality. Electronically Signed   By: Jorje Guild M.D.   On: 06/01/2021 06:01   DG Shoulder Left Port  Result Date: 05/31/2021 CLINICAL DATA:  Left shoulder pain EXAM: LEFT SHOULDER COMPARISON:  None. FINDINGS: There is no evidence of fracture or dislocation. No significant arthropathy or other focal bone abnormality. Soft tissues are unremarkable. IMPRESSION: No acute osseous abnormality Electronically Signed   By: Yetta Glassman M.D.   On: 05/31/2021 10:37   VAS [place] LOWER EXTREMITY VENOUS (DVT)  Result Date: 05/31/2021  Lower Venous DVT Study Patient Name:  BRANDY ZUBA  Date of Exam:   05/31/2021 Medical Rec #: [id number]          Accession #:    [id number] Date of Birth: May 29, 1935           Patient Gender: F Patient Age:   85 years Exam Location:  [facility] Procedure:      VAS [place] LOWER EXTREMITY VENOUS (DVT) Referring Phys: Loree Fee HARRIS --------------------------------------------------------------------------------  Indications: Edema.  Comparison Study: 03/11/2021 bilateral lower extremity venous duplex- age                   indeterminate DVT right popliteal vein. Performing Technologist: Maudry Mayhew MHA, RDMS, RVT, RDCS  Examination Guidelines: A complete evaluation includes B-mode imaging, spectral Doppler, color Doppler, and power Doppler as needed of all accessible portions of each vessel. Bilateral testing is considered an integral part of a complete examination. Limited examinations for reoccurring indications may be performed as noted. The reflux portion of  the exam is performed with the patient in reverse Trendelenburg.  +---------+---------------+---------+-----------+----------+-----------------+  RIGHT     Compressibility Phasicity Spontaneity Properties Thrombus Aging     +---------+---------------+---------+-----------+----------+-----------------+  CFV       Full            Yes       Yes                                       +---------+---------------+---------+-----------+----------+-----------------+  SFJ       Full                                                                +---------+---------------+---------+-----------+----------+-----------------+  FV Prox   Full                                                                +---------+---------------+---------+-----------+----------+-----------------+  FV Mid    Full                                                                +---------+---------------+---------+-----------+----------+-----------------+  FV Distal Full                                                                +---------+---------------+---------+-----------+----------+-----------------+  PFV       Full                                                                +---------+---------------+---------+-----------+----------+-----------------+  POP       Partial         Yes       Yes                    Age Indeterminate  +---------+---------------+---------+-----------+----------+-----------------+  PTV       Full                                                                +---------+---------------+---------+-----------+----------+-----------------+  PERO      Full                                                                +---------+---------------+---------+-----------+----------+-----------------+   +---------+---------------+---------+-----------+----------+--------------+  LEFT      Compressibility Phasicity Spontaneity Properties Thrombus Aging   +---------+---------------+---------+-----------+----------+--------------+  CFV       Full            Yes       Yes                                    +---------+---------------+---------+-----------+----------+--------------+  SFJ       Full                                                             +---------+---------------+---------+-----------+----------+--------------+  FV Prox   Full                                                             +---------+---------------+---------+-----------+----------+--------------+  FV Mid    Full                                                             +---------+---------------+---------+-----------+----------+--------------+  FV Distal Full                                                             +---------+---------------+---------+-----------+----------+--------------+  PFV       Full                                                             +---------+---------------+---------+-----------+----------+--------------+  POP       Full            Yes       Yes                                    +---------+---------------+---------+-----------+----------+--------------+  PTV       Full                                                             +---------+---------------+---------+-----------+----------+--------------+  PERO      Full                                                             +---------+---------------+---------+-----------+----------+--------------+     Summary: RIGHT: - Findings consistent with age indeterminate deep vein thrombosis involving the right popliteal vein. - No cystic structure found in the popliteal fossa.  LEFT: - Findings appear essentially unchanged compared to previous examination. - There is no evidence of deep vein thrombosis in the lower extremity.  - No cystic structure found in the popliteal fossa.  *See table(s) above for measurements and observations. Electronically signed by Servando Snare MD on 05/31/2021 at 2:34:50 PM.     Final    ECHOCARDIOGRAM LIMITED  Result Date: 05/31/2021    ECHOCARDIOGRAM LIMITED REPORT   Patient Name:   KLARYSSA FAUTH Date of Exam: 05/31/2021 Medical Rec #:  [id number]         Height:       58.0 in Accession #:    [id number]        Weight:       88.2 lb Date of Birth:  November 27, 1935          BSA:          1.286 m Patient Age:    53 years  BP:           128/72 mmHg Patient Gender: F                 HR:           65 bpm. Exam Location:  Inpatient Procedure: Limited Echo, Limited Color Doppler and Cardiac Doppler Indications:    dyspnea  History:        Patient has prior history of Echocardiogram examinations, most                 recent 03/12/2021. Raynaud's. Scleroderma. Chronic kidney                 disease., Signs/Symptoms:Dyspnea; Risk Factors:Hypertension.  Sonographer:    Johny Chess RDCS Referring Phys: Horace  1. Left ventricular ejection fraction, by estimation, is 60 to 65%. The left ventricle has normal function. The left ventricle has no regional wall motion abnormalities. Left ventricular diastolic parameters are consistent with Grade II diastolic dysfunction (pseudonormalization). Elevated left atrial pressure. There is the interventricular septum is flattened in systole, consistent with right ventricular pressure overload.  2. Right ventricular systolic function is normal. There is severely elevated pulmonary artery systolic pressure. The estimated right ventricular systolic pressure is 71.6 mmHg.  3. The mitral valve is normal in structure. No evidence of mitral valve regurgitation. No evidence of mitral stenosis.  4. Tricuspid valve regurgitation is moderate to severe.  5. The aortic valve is normal in structure. Aortic valve regurgitation is mild. No aortic stenosis is present.  6. The inferior vena cava is normal in size with greater than 50% respiratory variability, suggesting right atrial pressure of 3 mmHg. FINDINGS  Left Ventricle: Left  ventricular ejection fraction, by estimation, is 60 to 65%. The left ventricle has normal function. The left ventricle has no regional wall motion abnormalities. The left ventricular internal cavity size was normal in size. There is  no left ventricular hypertrophy. The interventricular septum is flattened in systole, consistent with right ventricular pressure overload. Left ventricular diastolic parameters are consistent with Grade II diastolic dysfunction (pseudonormalization). Elevated left atrial pressure. Right Ventricle: No increase in right ventricular wall thickness. Right ventricular systolic function is normal. There is severely elevated pulmonary artery systolic pressure. The tricuspid regurgitant velocity is 4.01 m/s, and with an assumed right atrial pressure of 3 mmHg, the estimated right ventricular systolic pressure is 96.7 mmHg. Left Atrium: Left atrial size was normal in size. Right Atrium: Right atrial size was normal in size. Pericardium: There is no evidence of pericardial effusion. Mitral Valve: The mitral valve is normal in structure. There is mild thickening of the mitral valve leaflet(s). No evidence of mitral valve stenosis. Tricuspid Valve: The tricuspid valve is normal in structure. Tricuspid valve regurgitation is moderate to severe. No evidence of tricuspid stenosis. Aortic Valve: The aortic valve is normal in structure. Aortic valve regurgitation is mild. Aortic regurgitation PHT measures 503 msec. No aortic stenosis is present. Pulmonic Valve: The pulmonic valve was normal in structure. Pulmonic valve regurgitation is trivial. No evidence of pulmonic stenosis. Aorta: The aortic root is normal in size and structure. Venous: The inferior vena cava is normal in size with greater than 50% respiratory variability, suggesting right atrial pressure of 3 mmHg. IAS/Shunts: No atrial level shunt detected by color flow Doppler. LEFT VENTRICLE PLAX 2D LVIDd:         4.06 cm Diastology LVIDs:          2.18  cm LV e' medial:    5.55 cm/s LV PW:         1.10 cm LV E/e' medial:  15.2 LV IVS:        0.75 cm LV e' lateral:   5.33 cm/s                        LV E/e' lateral: 15.9  LEFT ATRIUM         Index LA diam:    3.20 cm 2.49 cm/m  AORTIC VALVE AI PHT:      503 msec  AORTA Ao Asc diam: 3.10 cm MITRAL VALVE               TRICUSPID VALVE MV Area (PHT): 3.53 cm    TR Peak grad:   64.3 mmHg MV Decel Time: 215 msec    TR Vmax:        401.00 cm/s MV E velocity: 84.50 cm/s MV A velocity: 85.10 cm/s MV E/A ratio:  0.99 Mihai Croitoru MD Electronically signed by Sanda Klein MD Signature Date/Time: 05/31/2021/1:23:45 PM    Final      Medications:     Current Medications:  sodium chloride   Intravenous Once   ambrisentan  10 mg Oral Daily   amiodarone  150 mg Intravenous Once   amLODipine  5 mg Oral Daily   atorvastatin  20 mg Oral Daily   ferrous sulfate  325 mg Oral Daily   folic acid  488 mcg Oral QPM   levothyroxine  50 mcg Oral Q0600   metoprolol tartrate       pantoprazole (PROTONIX) IV  40 mg Intravenous Q12H   predniSONE  5 mg Oral Q breakfast   tadalafil  40 mg Oral QPM   torsemide  10 mg Oral Daily   vitamin B-12  1,000 mcg Oral Daily    Infusions:  sodium chloride 10 mL/hr at 06/01/21 0847   amiodarone     Followed by   amiodarone        Patient Profile   Ms  Meghan Wise is a a 86 year old with a history of PAH,  scleroderma, CTD, chronic respiratory failure on 4-5 liters oxygen, CKD Stage IV, hypothyroidism, AVMs duodenum, GERD, DVT RLE, and PE.   Assessment/Plan  PAH H/O scelroderma. WHO Group I Followed at [facility] by Dr Daine Gravel. She has been on adicirca and letaris.  Failed Tyvaso due to side effects.  - Plan RHC today to assess hemodynamics.  - Plan to adjust diuretics post cath.   2. Symptomatic Anemia , GI bleed  H/O AVM. Hgb 7.3 today.  Given 1UPRBCs  - On PPI  -GI following.   3. Chronic Respiratory Failure On 4 liters at rest and 5 liters  liters with  exertion.   4. New Onset A fib -Developed A fib earlier today. Now back in NSR.  -Not on anticoagulant with GI bleed.   5. DVT. RLE -Previously diagnosed. Eliquis stopped with GI bleed.  -[place] R DVT- indeterminate age. R popliteal vein -Not on anticoagulant due to GI bleed.   6.CKD Stage IIIb -Creatinine baseline 1.8-2  7. Elevated LFTs  8. DNR/DNI -GOC- Palliative Care following.     Length of Stay: 1  Amy Clegg, NP  06/01/2021, 8:57 AM  Advanced Heart Failure Team Pager [phone] (M-F; 7a - 5p)  Please contact [facility] for night-coverage after hours (4p -7a ) and weekends on [email or web address]  Patient seen with  NP, agree with the above note.   History as outlined above.   RHC done today: RHC Procedural Findings: Hemodynamics (mmHg) RA mean 11 RV 57/13 PA 57/16, mean 33 PCWP mean 10 Oxygen saturations: PA 60% AO 96% Cardiac Output (Fick) 4.79  Cardiac Index (Fick) 3.72 PVR 4.8 WU  General: NAD, thin/frail Neck: JVP 8-9 cm, no thyromegaly or thyroid nodule.  Lungs: Mild crackles at bases. CV: Nondisplaced PMI.  Heart regular S1/S2, no S3/S4, no murmur.  No peripheral edema.  No carotid bruit.  Normal pedal pulses.  Abdomen: Soft, nontender, no hepatosplenomegaly, no distention.  Skin: Intact without lesions or rashes.  Neurologic: Alert and oriented x 3.  Psych: Normal affect. Extremities: No clubbing or cyanosis.  HEENT: Normal.   Assessment/Plan: 1. Pulmonary hypertension/RV failure: Moderate pulmonary arterial hypertension by RHC with PVR 4.8 WU.  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  She has been Opsumit and tadalafil at home, did not tolerate Tyvaso.  Per Dr. Matilde Bash review of recent high resolution CT chest, no significant ILD was present.  Echo on my review showed EF 60-65% with D-shaped septum, moderate RV dilation, mild RV dysfunction.  RHC showed normal PCWP, mildly elevated RA pressure. She is relatively well-compensated.   - Continue home Opsumit and tadalafil. - Consider Malvin Johns as outpatient.  - I will give Lasix 40 mg IV x 1 then back to po torsemide tomorrow.  2. Chronic hypoxemic respiratory failure: She is on 4-5 L home oxygen.  Per Dr. Matilde Bash review of high resolution CT chest, she does not have significant pulmonary fibrosis.  ?Hypoxemia due to pulmonary hypertension only.  3. Atrial fibrillation: Paroxysmal, she went into AF with RVR this morning, now on amiodarone gtt and back in NSR.  - Unable to anticoagulate with active GI bleeding.  - Mildly elevated LFTs, unrelated to amiodarone.  Would continue amiodarone gtt for now, would try to avoid long-term amiodarone.  4. GI bleeding: Melena, h/o AVMs.  She has had blood this admission.  - Eliquis on hold.  - GI following.  5. PE: H/o DVT/PE, on Eliquis at home.  This admission, V/Q scan equivocal for PE.  - Off Eliquis with GI bleeding.  - Planning for IVC filter.  6. CKD: Stage 3.  Follow closely.   Loralie Champagne 06/01/2021 3:00 PM

## 2021-06-01 NOTE — Progress Notes (Signed)
Daily Progress Note   Patient Name: Meghan Wise       Date: 06/01/2021 DOB: 01-05-1936  Age: 86 y.o. MRN#: [id number] Attending Physician: Albertine Patricia, MD Primary Care Physician: Collene Leyden, MD Admit Date: 05/30/2021  Reason for Consultation/Follow-up: Establishing goals of care, "Pulm HTN, acute DVT, Now GI bleed, frail - GOC"  Subjective: Chart review performed - noted overnight/morning events. Received report from primary RN - no acute concerns. RN confirms patient is scheduled for cardiac cath today around 10:30a.  9:30 AM Went to visit patient at bedside for scheduled meeting with patient and her daughter/HCPOA/Meghan Wise. Patient was lying in bed awake, alert, oriented, and able to participate in conversation. No signs or non-verbal gestures of pain or discomfort noted. No respiratory distress, increased work of breathing, or secretions noted. Patient denies pain; endorses shortness of breath.  Emotional support provided. Patient is agreeable for discussion of MOST form today. Introduced, reviewed, and partially completed MOST form with patient and daughter. As patient/daughter were discussing Section E, patient became increasingly short of breath. RN was present at bedside - vitals were taken. After patient sat on edge of bed for several minutes she reported feeling better. I remained at bedside for continued assessment to ensure she remained stable during this time. Patient requested completion of MOST form tomorrow. Scheduled follow up visit tomorrow at 9:30a. She is hopeful cardiac cath today can provide insight into her situation.  Made copy of HCPOA document.   All questions and concerns addressed. Encouraged to call with questions and/or concerns. PMT card provided.   Length  of Stay: 1  Current Medications: Scheduled Meds:   sodium chloride   Intravenous Once   ambrisentan  10 mg Oral Daily   amiodarone  150 mg Intravenous Once   amLODipine  5 mg Oral Daily   atorvastatin  20 mg Oral Daily   ferrous sulfate  325 mg Oral Daily   folic acid  157 mcg Oral QPM   levothyroxine  50 mcg Oral Q0600   metoprolol tartrate       pantoprazole (PROTONIX) IV  40 mg Intravenous Q12H   predniSONE  5 mg Oral Q breakfast   tadalafil  40 mg Oral QPM   torsemide  10 mg Oral Daily   vitamin B-12  1,000 mcg Oral Daily  Continuous Infusions:  sodium chloride 10 mL/hr at 06/01/21 0847   amiodarone     Followed by   amiodarone      PRN Meds: acetaminophen **OR** acetaminophen, melatonin, morphine injection, traMADol  Physical Exam Vitals and nursing note reviewed.  Constitutional:      General: She is not in acute distress.    Comments: Chronically ill and frail appearing  Pulmonary:     Effort: No respiratory distress.     Comments: Conversational dyspnea Skin:    General: Skin is warm and dry.  Neurological:     Mental Status: She is alert and oriented to person, place, and time.  Psychiatric:        Attention and Perception: Attention normal.        Behavior: Behavior is cooperative.        Cognition and Memory: Cognition and memory normal.            Vital Signs: BP (!) 104/56    Pulse 68    Temp 98.1 F (36.7 C) (Oral)    Resp 19    Ht _0  (1.473 m)    Wt 40 kg    SpO2 99%    BMI 18.43 kg/m  SpO2: SpO2: 99 % O2 Device: O2 Device: High Flow Nasal Cannula O2 Flow Rate: O2 Flow Rate (L/min): 6 L/min  Intake/output summary: No intake or output data in the 24 hours ending 06/01/21 0927 LBM:   Baseline Weight: Weight: 40 kg Most recent weight: Weight: 40 kg       Palliative Assessment/Data: PPS 50%    Flowsheet Rows    Flowsheet Row Most Recent Value  Intake Tab   Referral Department Hospitalist  Unit at Time of Referral ER  Palliative  Care Primary Diagnosis Pulmonary  Date Notified 05/31/21  Reason for referral Clarify Goals of Care  Date of Admission 05/30/21  Date first seen by Palliative Care 05/31/21  # of days Palliative referral response time 0 Day(s)  # of days IP prior to Palliative referral 1  Clinical Assessment   Psychosocial & Spiritual Assessment   Palliative Care Outcomes   Patient/Family meeting held? Yes  Who was at the meeting? patient, daughter  Palliative Care Outcomes Improved pain interventions, Clarified goals of care, Counseled regarding hospice, Provided advance care planning, Provided psychosocial or spiritual support, Linked to palliative care logitudinal support  Patient/Family wishes: Interventions discontinued/not started  Mechanical Ventilation, Trach       Patient Active Problem List   Diagnosis Date Noted   Pressure injury of skin 06/01/2021   Symptomatic anemia 05/31/2021   Chronic kidney disease, stage 3b ([place]) 05/31/2021   History of venous thromboembolism 05/31/2021   Leukocytosis 05/31/2021   CHF (congestive heart failure) ([place]) 03/11/2021   DVT (deep venous thrombosis) ([place]) 03/11/2021   Pleuritic pain 03/10/2021   ILD (interstitial lung disease) ([place]) 12/27/2018   Pulmonary hypertension ([place]) 12/27/2018   Dyspnea 04/04/2018   SOB (shortness of breath) 04/02/2018   Scleroderma ([place]) 04/02/2018   PNA (pneumonia) 07/22/2016   Acute on chronic respiratory failure with hypoxia ([place]) 07/21/2016   Community acquired pneumonia 07/21/2016   Anemia of chronic disease 07/21/2016   AVM (arteriovenous malformation) of duodenum, acquired 05/07/2014   Chronic diastolic CHF (congestive heart failure), NYHA class 4 ([place]) 05/28/2012   HTN (hypertension)    Raynaud disease    Hypothyroidism     Palliative Care Assessment & Plan   Patient Profile: 86 y.o. female  with past  medical history of  hypertension, hyperlipidemia, hypothyroidism, CKD stage IIIb, scleroderma, chronic  diastolic CHF, pulmonary hypertension, chronic respiratory failure on 4 L oxygen presented to ED from [facility] with complaints of increased dyspnea. She was evaluated at [facility] pulmonary hypertension clinic on 2/6 and labs revealed worsening anemia with hemoglobin 6.8 - she was advised to come to ED. Patient was admitted on 05/30/2021 with dyspnea (thought to be multifactorial in setting of severe pulmonary hypertension, CHF, and worsening anemia), symptomatic anemia, and pulmonary hypertension.  Assessment: Acute on chronic respiratory failure Acute upper GI blood loss anemia Advanced pulmonary hypertension Atrial fibrillation with RVR Tramsaminitis Chronic diastolic CHF Severe protein calorie malnutrition AKI on CKD 3b History of scleroderma  Recommendations/Plan: Continue current full scope medical treatment. Patient is open to all recommended tests/procedures at this time, including heart catheterization today and additional blood transfusions if needed Continue DNR/DNI - durable DNR form completed and placed in shadow chart Obtained HCPOA. Copy of durable DNR and HCPOA was made and will be scanned into Vynca/ACP tab Introduced and reviewed MOST form - will return tomorrow 2/9 to finalize. She has indicated today wishes for the following: DNR/DNI, Limited additional interventions, Determine use or limitation of antibiotics when infection occurs. Form left at bedside for patient/family review and discussion PMT will continue to follow and support holistically   Goals of Care and Additional Recommendations: Limitations on Scope of Treatment: Full Scope Treatment and No Tracheostomy  Code Status:    Code Status Orders  (From admission, onward)           Start     Ordered   05/31/21 0243  Do not attempt resuscitation (DNR)  Continuous       Question Answer Comment  In the event of cardiac or respiratory ARREST Do not call a code blue   In the event of cardiac or  respiratory ARREST Do not perform Intubation, CPR, defibrillation or ACLS   In the event of cardiac or respiratory ARREST Use medication by any route, position, wound care, and other measures to relive pain and suffering. May use oxygen, suction and manual treatment of airway obstruction as needed for comfort.      05/31/21 0242           Code Status History     Date Active Date Inactive Code Status Order ID Comments User Context   05/31/2021 0214 05/31/2021 0242 Full Code [id number]  Shela Leff, MD ED   03/11/2021 2254 03/15/2021 1814 Full Code [id number]  Rise Patience, MD ED   04/03/2018 0021 04/04/2018 2000 Full Code [id number]  Bethena Roys, MD Inpatient   07/21/2016 2030 07/22/2016 1220 Full Code [id number]  Theodis Blaze, MD Inpatient   05/24/2012 1127 05/28/2012 1946 Full Code [id number]  Elvera Lennox, MD Inpatient       Prognosis:  Unable to determine  Discharge Planning: To Be Determined  Care plan was discussed with primary RN, Dr. Waldron Labs, patient, patient's daughter  Thank you for allowing the Palliative Medicine Team to assist in the care of this patient.   Total Time 70 minutes Prolonged Time Billed  yes       Greater than 50%  of this time was spent counseling and coordinating care related to the above assessment and plan.  Lin Landsman, NP  Please contact Palliative Medicine Team phone at [phone] for questions and concerns.

## 2021-06-01 NOTE — Evaluation (Signed)
Occupational Therapy Evaluation Patient Details Name: Meghan Wise MRN: [id number] DOB: 11-22-1935 Today's Date: 06/01/2021   History of Present Illness Pt. is a 86 y.o. presentibg to [facility] on 2/6 with chief complaints of worsening SOB over the past 3 weeks, anemia. On 2/8 pt. had episode of SOB and tachycardic, with EKG showing A-fib with RVR, which she then received PRBC. PMH significant for hypertension, hyperlipidemia, hypothyroidism, CKD stage IIIb, scleroderma, chronic diastolic CHF, pulmonary hypertension, chronic respiratory failure on 4 L oxygen (5L for exertion), Admitted in November 2022 for dyspnea/chest pain with concern for PE.   Clinical Impression   Pt presents with decline in function and safety with ADLs and ADL mobility with impaired strength, balance and endurance. PTA pt lived in an [place] apartment on 3rd floor, was Ind with ADLs/selfcare, IADLs, light meal prep, was driving and did not use an AD for walking (reports that she has RW but does not use it). Pt currently required min A with LB ADLs and min guard A with mobility HHA. Pt has A/E and DME at home. Pt would benefit from acute OT services to address impairments to maximize level of function and safety     Recommendations for follow up therapy are one component of a multi-disciplinary discharge planning process, led by the attending physician.  Recommendations may be updated based on patient status, additional functional criteria and insurance authorization.   Follow Up Recommendations  Home health OT    Assistance Recommended at Discharge Intermittent Supervision/Assistance  Patient can return home with the following A little help with bathing/dressing/bathroom;A little help with walking and/or transfers;Assistance with cooking/housework;Assist for transportation    Functional Status Assessment  Patient has had a recent decline in their functional status and demonstrates the ability to make significant improvements in  function in a reasonable and predictable amount of time.  Equipment Recommendations  None recommended by OT    Recommendations for Other Services       Precautions / Restrictions Precautions Precautions: Fall Precaution Comments: pt with age indeterminate R popliteal vein DVT and possible PE - not an anticoagulation candidate, per orders appropriate for pt to ambulate in room and per RN pt appropriate for OOB and has been mobilizing to/from [facility] this admission. Plan for IVC filter placement 2/9, limited eval to transfer-level only until this. Restrictions Weight Bearing Restrictions: No      Mobility Bed Mobility Overal bed mobility: Needs Assistance Bed Mobility: Supine to Sit, Sit to Supine     Supine to sit: Min guard Sit to supine: Min guard   General bed mobility comments: no physical assist required    Transfers Overall transfer level: Needs assistance Equipment used: 1 person hand held assist Transfers: Sit to/from Stand Sit to Stand: Min guard           General transfer comment: 2-3 steps to [facility], chair and back to bed      Balance Overall balance assessment: Needs assistance Sitting-balance support: Feet supported, No upper extremity supported Sitting balance-Leahy Scale: Fair     Standing balance support: Single extremity supported, No upper extremity supported, During functional activity Standing balance-Leahy Scale: Fair                             ADL either performed or assessed with clinical judgement   ADL Overall ADL's : Needs assistance/impaired Eating/Feeding: Independent;Sitting   Grooming: Wash/dry hands;Wash/dry face;Min guard;Standing   Upper Body Bathing: Set up;Supervision/  safety;Sitting   Lower Body Bathing: Minimal assistance   Upper Body Dressing : Set up;Supervision/safety;Sitting   Lower Body Dressing: Minimal assistance   Toilet Transfer: Min guard;Ambulation;BSC/3in1   Toileting- Water quality scientist and  Hygiene: Min guard;Sit to/from stand       Functional mobility during ADLs: Min guard       Vision Baseline Vision/History: 1 Wears glasses Ability to See in Adequate Light: 0 Adequate Patient Visual Report: No change from baseline       Perception     Praxis      Pertinent Vitals/Pain Pain Assessment Pain Assessment: No/denies pain Pain Score: 2  Pain Location: L chest Pain Descriptors / Indicators: Discomfort Pain Intervention(s): Monitored during session, Repositioned     Hand Dominance Right   Extremity/Trunk Assessment Upper Extremity Assessment Upper Extremity Assessment: Generalized weakness   Lower Extremity Assessment Lower Extremity Assessment: Defer to PT evaluation   Cervical / Trunk Assessment Cervical / Trunk Assessment: Kyphotic   Communication Communication Communication: No difficulties   Cognition Arousal/Alertness: Awake/alert Behavior During Therapy: WFL for tasks assessed/performed Overall Cognitive Status: Within Functional Limits for tasks assessed                                       General Comments       Exercises     Shoulder Instructions      Home Living Family/patient expects to be discharged to:: Private residence Living Arrangements: Alone   Type of Home: Independent living facility Home Access: Level entry     Home Layout: One level     Bathroom Shower/Tub: Walk-in Psychologist, prison and probation services: Standard     Home Equipment: Conservation officer, nature (2 wheels);Grab bars - tub/shower;Grab bars - toilet;Shower seat;Adaptive equipment Adaptive Equipment: Reacher        Prior Functioning/Environment Prior Level of Function : Independent/Modified Independent;Driving             Mobility Comments: has RW but does not use, admits to furniture surfing ADLs Comments: Ind with ADLs/selfcare, simple meal prep        OT Problem List: Decreased strength;Impaired balance (sitting and/or standing);Decreased  activity tolerance;Decreased knowledge of use of DME or AE      OT Treatment/Interventions: Self-care/ADL training;Patient/family education;Therapeutic activities;DME and/or AE instruction    OT Goals(Current goals can be found in the care plan section) Acute Rehab OT Goals Patient Stated Goal: go home OT Goal Formulation: With patient Time For Goal Achievement: 06/15/21 Potential to Achieve Goals: Good ADL Goals Pt Will Perform Grooming: with set-up;with supervision;standing Pt Will Perform Upper Body Bathing: with set-up;with modified independence;sitting Pt Will Perform Lower Body Bathing: with min guard assist;with supervision Pt Will Perform Upper Body Dressing: with set-up;with modified independence;sitting Pt Will Perform Lower Body Dressing: with min guard assist;with supervision Pt Will Transfer to Toilet: with supervision;with modified independence;ambulating Pt Will Perform Toileting - Clothing Manipulation and hygiene: with supervision;with modified independence;sit to/from stand Pt Will Perform Tub/Shower Transfer: with min guard assist;with supervision;ambulating;shower seat Additional ADL Goal #1: Pt will verbalize and demo 3 energy conservation techiques for ADLs/selfcare tasks  OT Frequency: Min 2X/week    Co-evaluation              AM-PAC OT "6 Clicks" Daily Activity     Outcome Measure Help from another person eating meals?: None Help from another person taking care of personal grooming?: A Little Help  from another person toileting, which includes using toliet, bedpan, or urinal?: A Little Help from another person bathing (including washing, rinsing, drying)?: A Little Help from another person to put on and taking off regular upper body clothing?: None Help from another person to put on and taking off regular lower body clothing?: A Little 6 Click Score: 20   End of Session Equipment Utilized During Treatment: Gait belt  Activity Tolerance: Patient limited  by fatigue Patient left: in bed;with call bell/phone within reach;with bed alarm set  OT Visit Diagnosis: Unsteadiness on feet (R26.81);Muscle weakness (generalized) (M62.81)                Time: 5790-3833 OT Time Calculation (min): 27 min Charges:  OT General Charges $OT Visit: 1 Visit OT Evaluation $OT Eval Moderate Complexity: 1 Mod OT Treatments $Therapeutic Activity: 8-22 mins    Britt Bottom 06/01/2021, 1:50 PM

## 2021-06-01 NOTE — Progress Notes (Signed)
On arrival to cath lab holding, [place] 1, IV  Amiodarone infusing into rfa PIV at 33.3cc/hr=65m/hour, no signs of distress, no c/o pain, safety maintained, Kindle given to pt at her request

## 2021-06-01 NOTE — Progress Notes (Signed)
2694 Pt called and stated she was having difficulty breathing. Placed pt on side of the bed. Pt oxygen saturation was 93 to 94%. VS  taken and pt HR was ranging from 130s to 170s and RR in 30s. On call provider notified .who ordered STAT chest x- ray and EKG. Provider arrived  at the bedside and ordered 5 mg of  Lopressor IV once. STAT order received to give 1 unit of PRBC. Pt daughter was called with updates.

## 2021-06-01 NOTE — Interval H&P Note (Signed)
History and Physical Interval Note:  06/01/2021 1:33 PM  Meghan Wise  has presented today for surgery, with the diagnosis of pulm htn.  The various methods of treatment have been discussed with the patient and family. After consideration of risks, benefits and other options for treatment, the patient has consented to  Procedure(s): RIGHT HEART CATH (N/A) as a surgical intervention.  The patient's history has been reviewed, patient examined, no change in status, stable for surgery.  I have reviewed the patient's chart and labs.  Questions were answered to the patient's satisfaction.     [name]

## 2021-06-01 NOTE — Progress Notes (Signed)
°   06/01/21 0515  Assess: MEWS Score  Temp 97.8 F (36.6 C)  BP 123/66  Pulse Rate (!) 138  ECG Heart Rate (!) 139  Resp (!) 35  Level of Consciousness Alert  SpO2 99 %  O2 Device HFNC  Patient Activity (if Appropriate) In bed  O2 Flow Rate (L/min) 6 L/min  Assess: if the MEWS score is Yellow or Red  Were vital signs taken at a resting state? Yes  Focused Assessment Change from prior assessment (see assessment flowsheet)  Early Detection of Sepsis Score *See Row Information* High  MEWS guidelines implemented *See Row Information* Yes  Treat  MEWS Interventions Escalated (See documentation below)  Pain Scale 0-10  Pain Score 4  Pain Type Acute pain  Pain Location Generalized  Take Vital Signs  Increase Vital Sign Frequency  Red: Q 1hr X 4 then Q 4hr X 4, if remains red, continue Q 4hrs  Escalate  MEWS: Escalate Red: discuss with charge nurse/RN and provider, consider discussing with RRT  Notify: Charge Nurse/RN  Name of Charge Nurse/RN Notified Christopher, RN  Date Charge Nurse/RN Notified 06/01/21  Time Charge Nurse/RN Notified 0515  Notify: Provider  Provider Name/Title Hal Hope, MD  Date Provider Notified 06/01/21  Time Provider Notified [phone]  Notification Type Page  Notification Reason Change in status  Provider response At bedside;See new orders;No new orders  Date of Provider Response 09/10/21  Time of Provider Response [phone]

## 2021-06-01 NOTE — Evaluation (Signed)
Physical Therapy Evaluation Patient Details Name: Meghan Wise MRN: [id number] DOB: 1935/05/26 Today's Date: 06/01/2021  History of Present Illness  Pt. is a 86 y.o. presenting to [facility] on 2/6 with chief complaints of worsening SOB over the past 3 weeks, anemia. On 2/8 pt. had episode of SOB and tachycardic, with EKG showing A-fib with RVR, which she then received PRBC. LE US shows age indeterminate deep vein thrombosis  involving the right popliteal vein, possible PE but unable to be on anticoagulation, plan for IVC filter and cardiac cath. PMH significant for hypertension, hyperlipidemia, hypothyroidism, CKD stage IIIb, scleroderma, chronic diastolic CHF, pulmonary hypertension, chronic respiratory failure on 4 L oxygen (5L for exertion), Admitted in November 2022 for dyspnea/chest pain with concern for PE.   Clinical Impression  Pt presents with generalized weakness, decreased balance, endurance, SOB, and DOE. She was able to perform bed mobility and come to sitting with min A x1 and stand with min guard. She is able to reach out of her BOS in sitting and side step in multiple directions while standing. Intermittent rest breaks throughout the session to maintain proper 02 saturation. She would benefit from skilled PT to prevent further loss of function. Then home health PT to build activity tolerance, improve balance and strength deficts once she receives IVC filter and heart cath.  PT to follow acutely as able. PT to progress mobility as tolerated, and will continue to follow acutely.       Recommendations for follow up therapy are one component of a multi-disciplinary discharge planning process, led by the attending physician.  Recommendations may be updated based on patient status, additional functional criteria and insurance authorization.  Follow Up Recommendations Home health PT    Assistance Recommended at Discharge    Patient can return home with the following  A little help with  walking and/or transfers;A little help with bathing/dressing/bathroom;Assistance with cooking/housework;Assist for transportation    Equipment Recommendations    Recommendations for Other Services       Functional Status Assessment Patient has had a recent decline in their functional status and demonstrates the ability to make significant improvements in function in a reasonable and predictable amount of time.     Precautions / Restrictions Precautions Precautions: Fall Precaution Comments: pt with age indeterminate R popliteal vein DVT and possible PE - not an anticoagulation candidate, per orders appropriate for pt to ambulate in room and per RN pt appropriate for OOB and has been mobilizing to/from [facility] this admission. Plan for IVC filter placement 2/9, limited eval to transfer-level only until this. Restrictions Weight Bearing Restrictions: No      Mobility  Bed Mobility Overal bed mobility: Needs Assistance Bed Mobility: Supine to Sit     Supine to sit: Min guard, Min assist Sit to supine: Min guard   General bed mobility comments: Min A for LE control and scooting during transfer    Transfers Overall transfer level: Needs assistance   Transfers: Sit to/from Stand Sit to Stand: Min guard           General transfer comment: Able to come to stand and self steady    Ambulation/Gait                  Stairs            Wheelchair Mobility    Modified Rankin (Stroke Patients Only)       Balance Overall balance assessment: Needs assistance Sitting-balance support: Feet supported, No upper extremity  supported Sitting balance-Leahy Scale: Fair Sitting balance - Comments: Pt. able to reach min out of BOS in mulitple directions and correct to sitting upright   Standing balance support: No upper extremity supported Standing balance-Leahy Scale: Fair Standing balance comment: Pt. able to come to stand and self steady. She does require a moment to catch  her breath after the transition.             High level balance activites: Side stepping High Level Balance Comments: Pt. able to tolerate 2-3 lateral steps in each direction with min guard             Pertinent Vitals/Pain      Home Living Family/patient expects to be discharged to:: Private residence Living Arrangements: Alone   Type of Home: Independent living facility Home Access: Level entry       Home Layout: One level Home Equipment: Rolling Walker (2 wheels);Grab bars - tub/shower;Grab bars - toilet;Shower seat;Adaptive equipment      Prior Function Prior Level of Function : Independent/Modified Independent;Driving             Mobility Comments: has RW but does not use, admits to furniture surfing ADLs Comments: Ind with ADLs/selfcare, simple meal prep     Hand Dominance   Dominant Hand: Right    Extremity/Trunk Assessment   Upper Extremity Assessment Upper Extremity Assessment: Generalized weakness    Lower Extremity Assessment Lower Extremity Assessment: Defer to PT evaluation    Cervical / Trunk Assessment Cervical / Trunk Assessment: Kyphotic  Communication   Communication: No difficulties  Cognition Arousal/Alertness: Awake/alert Behavior During Therapy: Anxious Overall Cognitive Status: Within Functional Limits for tasks assessed                                 General Comments: Pt. anxious to move due to history of SOB and DOE        General Comments      Exercises     Assessment/Plan    PT Assessment Patient needs continued PT services  PT Problem List Decreased strength;Decreased mobility;Decreased activity tolerance;Decreased balance       PT Treatment Interventions DME instruction;Therapeutic exercise;Gait training;Balance training;Neuromuscular re-education;Stair training;Functional mobility training;Therapeutic activities;Patient/family education    PT Goals (Current goals can be found in the Care  Plan section)  Acute Rehab PT Goals Patient Stated Goal: Return to home PT Goal Formulation: With patient Time For Goal Achievement: 06/15/21 Potential to Achieve Goals: Fair    Frequency Min 3X/week     Co-evaluation               AM-PAC PT "6 Clicks" Mobility  Outcome Measure Help needed turning from your back to your side while in a flat bed without using bedrails?: None Help needed moving from lying on your back to sitting on the side of a flat bed without using bedrails?: A Little Help needed moving to and from a bed to a chair (including a wheelchair)?: A Little Help needed standing up from a chair using your arms (e.g., wheelchair or bedside chair)?: A Little Help needed to walk in hospital room?: A Lot Help needed climbing 3-5 steps with a railing? : Total 6 Click Score: 16    End of Session   Activity Tolerance: Patient limited by fatigue Patient left: in bed;with call bell/phone within reach;with bed alarm set Nurse Communication: Mobility status PT Visit Diagnosis: Muscle weakness (generalized) (M62.81);Other abnormalities  of gait and mobility (R26.89);Difficulty in walking, not elsewhere classified (R26.2) (SOB, DOE)    Time: 5397-6734 PT Time Calculation (min) (ACUTE ONLY): 21 min   Charges:   PT Evaluation $PT Eval Low Complexity: 1 Low          Thermon Leyland, SPT Acute Rehab Services   Thermon Leyland 06/01/2021, 3:04 PM

## 2021-06-01 NOTE — Care Management (Signed)
Transition of Care [facility]) Screening Note   Patient Details  Name: BEANCA KIESTER Date of Birth: 22-Jan-1936   Transition of Care [facility]) CM/SW Contact:    Carles Collet, RN Phone Number: 06/01/2021, 8:21 AM    Transition of Care Department [facility]) has reviewed patient and we will continue to monitor patient advancement through interdisciplinary progression rounds. If new patient transition needs arise, please place a TOC consult.

## 2021-06-02 ENCOUNTER — Encounter (HOSPITAL_COMMUNITY): Payer: Self-pay | Admitting: Cardiology

## 2021-06-02 ENCOUNTER — Inpatient Hospital Stay (HOSPITAL_COMMUNITY): Payer: Medicare PPO

## 2021-06-02 DIAGNOSIS — F419 Anxiety disorder, unspecified: Secondary | ICD-10-CM

## 2021-06-02 DIAGNOSIS — R0600 Dyspnea, unspecified: Secondary | ICD-10-CM

## 2021-06-02 DIAGNOSIS — I272 Pulmonary hypertension, unspecified: Secondary | ICD-10-CM | POA: Diagnosis not present

## 2021-06-02 HISTORY — PX: IR IVC FILTER PLMT / S&I /IMG GUID/MOD SED: IMG701

## 2021-06-02 LAB — CBC WITH DIFFERENTIAL/PLATELET
Abs Immature Granulocytes: 0.03 10*3/uL (ref 0.00–0.07)
Basophils Absolute: 0 10*3/uL (ref 0.0–0.1)
Basophils Relative: 0 %
Eosinophils Absolute: 0.3 10*3/uL (ref 0.0–0.5)
Eosinophils Relative: 3 %
HCT: 30.8 % — ABNORMAL LOW (ref 36.0–46.0)
Hemoglobin: 10.1 g/dL — ABNORMAL LOW (ref 12.0–15.0)
Immature Granulocytes: 0 %
Lymphocytes Relative: 8 %
Lymphs Abs: 0.6 10*3/uL — ABNORMAL LOW (ref 0.7–4.0)
MCH: 28.2 pg (ref 26.0–34.0)
MCHC: 32.8 g/dL (ref 30.0–36.0)
MCV: 86 fL (ref 80.0–100.0)
Monocytes Absolute: 0.9 10*3/uL (ref 0.1–1.0)
Monocytes Relative: 10 %
Neutro Abs: 6.4 10*3/uL (ref 1.7–7.7)
Neutrophils Relative %: 79 %
Platelets: 356 10*3/uL (ref 150–400)
RBC: 3.58 MIL/uL — ABNORMAL LOW (ref 3.87–5.11)
RDW: 21.9 % — ABNORMAL HIGH (ref 11.5–15.5)
WBC: 8.2 10*3/uL (ref 4.0–10.5)
nRBC: 0 % (ref 0.0–0.2)

## 2021-06-02 LAB — BPAM RBC
Blood Product Expiration Date: 202302232359
Blood Product Expiration Date: 202303012359
ISSUE DATE / TIME: 202302062313
ISSUE DATE / TIME: 202302080618
Unit Type and Rh: 6200
Unit Type and Rh: 6200

## 2021-06-02 LAB — TYPE AND SCREEN
ABO/RH(D): A POS
Antibody Screen: NEGATIVE
Unit division: 0
Unit division: 0

## 2021-06-02 LAB — COMPREHENSIVE METABOLIC PANEL
ALT: 155 U/L — ABNORMAL HIGH (ref 0–44)
AST: 109 U/L — ABNORMAL HIGH (ref 15–41)
Albumin: 2.4 g/dL — ABNORMAL LOW (ref 3.5–5.0)
Alkaline Phosphatase: 176 U/L — ABNORMAL HIGH (ref 38–126)
Anion gap: 14 (ref 5–15)
BUN: 52 mg/dL — ABNORMAL HIGH (ref 8–23)
CO2: 21 mmol/L — ABNORMAL LOW (ref 22–32)
Calcium: 7.1 mg/dL — ABNORMAL LOW (ref 8.9–10.3)
Chloride: 105 mmol/L (ref 98–111)
Creatinine, Ser: 1.96 mg/dL — ABNORMAL HIGH (ref 0.44–1.00)
GFR, Estimated: 25 mL/min — ABNORMAL LOW (ref >60)
Glucose, Bld: 91 mg/dL (ref 70–99)
Potassium: 3.4 mmol/L — ABNORMAL LOW (ref 3.5–5.1)
Sodium: 140 mmol/L (ref 135–145)
Total Bilirubin: 0.5 mg/dL (ref 0.3–1.2)
Total Protein: 5.7 g/dL — ABNORMAL LOW (ref 6.5–8.1)

## 2021-06-02 LAB — BRAIN NATRIURETIC PEPTIDE: B Natriuretic Peptide: 1361.6 pg/mL — ABNORMAL HIGH (ref 0.0–100.0)

## 2021-06-02 LAB — MAGNESIUM: Magnesium: 2.3 mg/dL (ref 1.7–2.4)

## 2021-06-02 MED ORDER — AMIODARONE LOAD VIA INFUSION
150.0000 mg | Freq: Once | INTRAVENOUS | Status: AC
  Administered 2021-06-02: 150 mg via INTRAVENOUS
  Filled 2021-06-02: qty 83.34

## 2021-06-02 MED ORDER — POTASSIUM CHLORIDE CRYS ER 20 MEQ PO TBCR
30.0000 meq | EXTENDED_RELEASE_TABLET | Freq: Four times a day (QID) | ORAL | Status: AC
  Administered 2021-06-02 (×2): 30 meq via ORAL
  Filled 2021-06-02 (×2): qty 1

## 2021-06-02 MED ORDER — MIDAZOLAM HCL 2 MG/2ML IJ SOLN
INTRAMUSCULAR | Status: AC | PRN
  Administered 2021-06-02: .5 mg via INTRAVENOUS

## 2021-06-02 MED ORDER — AMIODARONE HCL IN DEXTROSE 360-4.14 MG/200ML-% IV SOLN
30.0000 mg/h | INTRAVENOUS | Status: DC
  Administered 2021-06-02: 30 mg/h via INTRAVENOUS
  Filled 2021-06-02 (×2): qty 200

## 2021-06-02 MED ORDER — AMIODARONE HCL IN DEXTROSE 360-4.14 MG/200ML-% IV SOLN
60.0000 mg/h | INTRAVENOUS | Status: AC
  Administered 2021-06-02 (×2): 60 mg/h via INTRAVENOUS
  Filled 2021-06-02: qty 200

## 2021-06-02 MED ORDER — MIDAZOLAM HCL 2 MG/2ML IJ SOLN
INTRAMUSCULAR | Status: AC
  Filled 2021-06-02: qty 2

## 2021-06-02 MED ORDER — ALPRAZOLAM 0.5 MG PO TABS
0.2500 mg | ORAL_TABLET | Freq: Three times a day (TID) | ORAL | Status: DC | PRN
  Administered 2021-06-02: 0.25 mg via ORAL
  Filled 2021-06-02 (×2): qty 1

## 2021-06-02 MED ORDER — FENTANYL CITRATE (PF) 100 MCG/2ML IJ SOLN
INTRAMUSCULAR | Status: AC
  Filled 2021-06-02: qty 2

## 2021-06-02 MED ORDER — IOHEXOL 300 MG/ML  SOLN: 100.0000 mL | Freq: Once | INTRAMUSCULAR | Status: DC | PRN

## 2021-06-02 MED ORDER — LIDOCAINE HCL 1 % IJ SOLN
INTRAMUSCULAR | Status: AC
  Filled 2021-06-02: qty 20

## 2021-06-02 MED ORDER — LOPERAMIDE HCL 2 MG PO CAPS
2.0000 mg | ORAL_CAPSULE | ORAL | Status: DC | PRN
  Administered 2021-06-02 – 2021-06-03 (×2): 2 mg via ORAL
  Filled 2021-06-02 (×2): qty 1

## 2021-06-02 NOTE — Progress Notes (Addendum)
Daily Progress Note   Patient Name: Meghan Wise       Date: 06/02/2021 DOB: March 08, 1936  Age: 86 y.o. MRN#: [id number] Attending Physician: Albertine Patricia, MD Primary Care Physician: Collene Leyden, MD Admit Date: 05/30/2021  Reason for Consultation/Follow-up: Establishing goals of care, "Pulm HTN, acute DVT, Now GI bleed, frail - GOC"  Subjective: Chart review performed. Received report from primary RN - no acute concerns. Discussed patient's recent episode of afib with RVR - she's now on amio drip and back in NSR.   Went to visit patient at bedside - daughter/Linda present. Patient was sitting on edge of bed with complaints of shortness of breath. Per bedside monitor, she was in NSR with HR in 70s, O2 mid 90s, with RR getting as high as 34. She was on 4L O2 [place] - increased to 6L for comfort. Patient endorses feeling very anxious - discussed how this could attribute to her dyspnea. She is agreeable to try medication to assist with anxiety. Patient also requests to get up and sit in chair - assisted her to chair - when visit was over left with daughter at bedside and call bell in reach. Patient/family report a negative interaction with a staff member - stated I would speak with unit leadership to see if staff could be switched per their request.  Discussed results of recent heart cath and lab work.   Reviewed outpatient palliative care with daughter per her request. Patient would like to continue conversation when she feels better.  All questions and concerns addressed. Encouraged to call with questions and/or concerns. PMT card previously provided.  Spoke with Fowler Surveyor, quantity about patient/family concerns - she will address. Informed RN that oxygen now at 6L and patient sitting up in  chair. Requested she provide dose of xanax after it's ordered.  Discussed symptom management with Dr. Waldron Labs.   Length of Stay: 2  Current Medications: Scheduled Meds:   sodium chloride   Intravenous Once   ambrisentan  10 mg Oral Daily   atorvastatin  20 mg Oral Daily   ferrous sulfate  325 mg Oral Daily   folic acid  944 mcg Oral QPM   levothyroxine  50 mcg Oral Q0600   lidocaine       pantoprazole (PROTONIX) IV  40 mg Intravenous Q12H  potassium chloride  30 mEq Oral Q6H   predniSONE  5 mg Oral Q breakfast   sodium chloride flush  3 mL Intravenous Q12H   tadalafil  40 mg Oral QPM   vitamin B-12  1,000 mcg Oral Daily    Continuous Infusions:  sodium chloride     amiodarone 60 mg/hr (06/02/21 1130)   Followed by   amiodarone      PRN Meds: sodium chloride, acetaminophen **OR** acetaminophen, ALPRAZolam, iohexol, melatonin, morphine injection, sodium chloride flush, traMADol  Physical Exam Vitals and nursing note reviewed.  Constitutional:      General: She is not in acute distress.    Comments: Chronically ill and frail appearing  Pulmonary:     Effort: No respiratory distress.     Comments: Conversational dyspnea Skin:    General: Skin is warm and dry.  Neurological:     Mental Status: She is alert and oriented to person, place, and time.     Motor: Weakness present.  Psychiatric:        Attention and Perception: Attention normal.        Behavior: Behavior is cooperative.        Cognition and Memory: Cognition and memory normal.            Vital Signs: BP (!) 132/57 (BP Location: Left Arm)    Pulse (!) 109    Temp 98.6 F (37 C) (Oral)    Resp (!) 25    Ht _0  (1.473 m)    Wt 40 kg    SpO2 97%    BMI 18.43 kg/m  SpO2: SpO2: 97 % O2 Device: O2 Device: Nasal Cannula O2 Flow Rate: O2 Flow Rate (L/min): 4 L/min  Intake/output summary:  Intake/Output Summary (Last 24 hours) at 06/02/2021 1417 Last data filed at 06/02/2021 1100 Gross per 24 hour  Intake  559.38 ml  Output 300 ml  Net 259.38 ml   LBM:   Baseline Weight: Weight: 40 kg Most recent weight: Weight: 40 kg       Palliative Assessment/Data: PPS 50-60%    Flowsheet Rows    Flowsheet Row Most Recent Value  Intake Tab   Referral Department Hospitalist  Unit at Time of Referral ER  Palliative Care Primary Diagnosis Pulmonary  Date Notified 05/31/21  Reason for referral Clarify Goals of Care  Date of Admission 05/30/21  Date first seen by Palliative Care 05/31/21  # of days Palliative referral response time 0 Day(s)  # of days IP prior to Palliative referral 1  Clinical Assessment   Psychosocial & Spiritual Assessment   Palliative Care Outcomes   Patient/Family meeting held? Yes  Who was at the meeting? patient, daughter  Palliative Care Outcomes Improved pain interventions, Clarified goals of care, Counseled regarding hospice, Provided advance care planning, Provided psychosocial or spiritual support, Linked to palliative care logitudinal support  Patient/Family wishes: Interventions discontinued/not started  Mechanical Ventilation, Trach       Patient Active Problem List   Diagnosis Date Noted   Pressure injury of skin 06/01/2021   Symptomatic anemia 05/31/2021   Chronic kidney disease, stage 3b ([place]) 05/31/2021   History of venous thromboembolism 05/31/2021   Leukocytosis 05/31/2021   CHF (congestive heart failure) ([place]) 03/11/2021   DVT (deep venous thrombosis) ([place]) 03/11/2021   Pleuritic pain 03/10/2021   ILD (interstitial lung disease) ([place]) 12/27/2018   Pulmonary hypertension ([place]) 12/27/2018   Dyspnea 04/04/2018   SOB (shortness of breath) 04/02/2018   Scleroderma ([place])  04/02/2018   PNA (pneumonia) 07/22/2016   Acute on chronic respiratory failure with hypoxia ([place]) 07/21/2016   Community acquired pneumonia 07/21/2016   Anemia of chronic disease 07/21/2016   AVM (arteriovenous malformation) of duodenum, acquired 05/07/2014   Chronic diastolic CHF  (congestive heart failure), NYHA class 4 ([place]) 05/28/2012   HTN (hypertension)    Raynaud disease    Hypothyroidism     Palliative Care Assessment & Plan   Patient Profile: 86 y.o. female  with past medical history of  hypertension, hyperlipidemia, hypothyroidism, CKD stage IIIb, scleroderma, chronic diastolic CHF, pulmonary hypertension, chronic respiratory failure on 4 L oxygen presented to ED from [facility] with complaints of increased dyspnea. She was evaluated at [facility] pulmonary hypertension clinic on 2/6 and labs revealed worsening anemia with hemoglobin 6.8 - she was advised to come to ED. Patient was admitted on 05/30/2021 with dyspnea (thought to be multifactorial in setting of severe pulmonary hypertension, CHF, and worsening anemia), symptomatic anemia, and pulmonary hypertension.  Assessment: Acute on chronic respiratory failure Acute upper GI blood loss anemia Advanced pulmonary hypertension Atrial fibrillation with RVR Tramsaminitis Chronic diastolic CHF Severe protein calorie malnutrition AKI on CKD 3b History of scleroderma  Recommendations/Plan: Continue current full scope medical treatment Continue DNR/DNI as previously documented Xanax 0.68m TID PRN anxiety Will finalize MOST form and continue discussion on outpatient Palliative care at a later date when patient is feeling better per her request PMT will continue to follow and support holistically  **ADDENDUM** Notified by RN: "This pt said she has bacterial overgrowth in her gut that causes diarrhea and that she normally takes imodium at home for this." -imodum 275mPO PRN for loose stools   Goals of Care and Additional Recommendations: Limitations on Scope of Treatment: Full Scope Treatment  Code Status:    Code Status Orders  (From admission, onward)           Start     Ordered   05/31/21 0243  Do not attempt resuscitation (DNR)  Continuous       Question Answer Comment  In the event of  cardiac or respiratory ARREST Do not call a code blue   In the event of cardiac or respiratory ARREST Do not perform Intubation, CPR, defibrillation or ACLS   In the event of cardiac or respiratory ARREST Use medication by any route, position, wound care, and other measures to relive pain and suffering. May use oxygen, suction and manual treatment of airway obstruction as needed for comfort.      05/31/21 0242           Code Status History     Date Active Date Inactive Code Status Order ID Comments User Context   05/31/2021 0214 05/31/2021 0242 Full Code [id number] [id number] ED   03/11/2021 2254 03/15/2021 1814 Full Code [id number] PatienceMD ED   04/03/2018 0021 04/04/2018 2000 Full Code [id number] [id number] Inpatient   07/21/2016 2030 07/22/2016 1220 Full Code [id number] BlazeMD Inpatient   05/24/2012 1127 05/28/2012 1946 Full Code [id number] LennoxMD Inpatient       Prognosis:  Unable to determine  Discharge Planning: To Be Determined  Care plan was discussed with primary RN, unit assistant director, Dr. ElWaldron Labspatient, patient's daughter  Thank you for allowing the Palliative Medicine Team to assist in the care of this patient.   Total Time 37 minutes Prolonged Time Billed  no  Greater than 50%  of this time was spent counseling and coordinating care related to the above assessment and plan.  Lin Landsman, NP  Please contact Palliative Medicine Team phone at [phone] for questions and concerns.

## 2021-06-02 NOTE — Progress Notes (Signed)
Patient ID: Meghan Wise, female   DOB: June 19, 1935, 86 y.o.   MRN: [id number]     Advanced Heart Failure Rounding Note  PCP-Cardiologist: None   Subjective:    Patient is in NSR today.  She was taken off amiodarone.  Hgb stable at 10.1, no overt bleeding. Creatinine up to 1.96 after 1 dose Lasix 40 mg IV yesterday.   She is on her baseline 4L home oxygen.   RHC Procedural Findings: Hemodynamics (mmHg) RA mean 11 RV 57/13 PA 57/16, mean 33 PCWP mean 10 Oxygen saturations: PA 60% AO 96% Cardiac Output (Fick) 4.79  Cardiac Index (Fick) 3.72 PVR 4.8 WU   Objective:   Weight Range: 40 kg Body mass index is 18.43 kg/m.   Vital Signs:   Temp:  [98.2 F (36.8 C)-98.6 F (37 C)] 98.6 F (37 C) (02/08 2335) Pulse Rate:  [59-72] 70 (02/08 2335) Resp:  [19-27] 20 (02/08 2335) BP: (74-138)/(32-79) 138/70 (02/08 2335) SpO2:  [91 %-98 %] 97 % (02/08 2335)    Weight change: Filed Weights   05/30/21 1908  Weight: 40 kg    Intake/Output:   Intake/Output Summary (Last 24 hours) at 06/02/2021 0821 Last data filed at 06/01/2021 1700 Gross per 24 hour  Intake 773.88 ml  Output 300 ml  Net 473.88 ml      Physical Exam    General:  Frail-appearing HEENT: Normal Neck: Supple. JVP 8-9. Carotids 2+ bilat; no bruits. No lymphadenopathy or thyromegaly appreciated. Cor: PMI nondisplaced. Regular rate & rhythm. No rubs, gallops or murmurs. Lungs: Clear Abdomen: Soft, nontender, nondistended. No hepatosplenomegaly. No bruits or masses. Good bowel sounds. Extremities: No cyanosis, clubbing, rash, edema Neuro: Alert & orientedx3, cranial nerves grossly intact. moves all 4 extremities w/o difficulty. Affect pleasant   Telemetry   NSR 80s, personally reviewed  Labs    CBC Recent Labs    06/01/21 0133 06/01/21 1415 06/01/21 1418 06/02/21 0445  WBC 9.9  --   --  8.2  NEUTROABS 8.1*  --   --  6.4  HGB 7.3*   < > 10.2* 10.1*  HCT 23.1*   < > 30.0* 30.8*  MCV 94.7  --    --  86.0  PLT 339  --   --  356   < > = values in this interval not displayed.   Basic Metabolic Panel Recent Labs    06/01/21 0133 06/01/21 1415 06/01/21 1418 06/02/21 0445  NA 137   < > 140 140  K 4.0   < > 3.7 3.4*  CL 103  --   --  105  CO2 22  --   --  21*  GLUCOSE 97  --   --  91  BUN 41*  --   --  52*  CREATININE 1.69*  --   --  1.96*  CALCIUM 7.3*  --   --  7.1*  MG 2.3  --   --  2.3   < > = values in this interval not displayed.   Liver Function Tests Recent Labs    06/01/21 0133 06/02/21 0445  AST 359* 109*  ALT 242* 155*  ALKPHOS 210* 176*  BILITOT 0.8 0.5  PROT 5.6* 5.7*  ALBUMIN 2.5* 2.4*   No results for input(s): LIPASE, AMYLASE in the last 72 hours. Cardiac Enzymes No results for input(s): CKTOTAL, CKMB, CKMBINDEX, TROPONINI in the last 72 hours.  BNP: BNP (last 3 results) Recent Labs    05/31/21 0257 06/01/21 0133  06/02/21 0445  BNP 785.4* 1,227.1* 1,361.6*    ProBNP (last 3 results) No results for input(s): PROBNP in the last 8760 hours.   D-Dimer No results for input(s): DDIMER in the last 72 hours. Hemoglobin A1C No results for input(s): HGBA1C in the last 72 hours. Fasting Lipid Panel No results for input(s): CHOL, HDL, LDLCALC, TRIG, CHOLHDL, LDLDIRECT in the last 72 hours. Thyroid Function Tests Recent Labs    06/01/21 0622  TSH 3.596    Other results:   Imaging    CARDIAC CATHETERIZATION  Result Date: 06/02/2021 1. Moderate pulmonary arterial hypertension with PVR 4.8 WU. 2. Normal PCWP. 3. Mildly elevated RA and RVEDP. 4. Preserved cardiac output. Continue home PH medications.  Will give 1 dose of Lasix 40 mg IV.     Medications:     Scheduled Medications:  sodium chloride   Intravenous Once   ambrisentan  10 mg Oral Daily   atorvastatin  20 mg Oral Daily   ferrous sulfate  325 mg Oral Daily   folic acid  916 mcg Oral QPM   levothyroxine  50 mcg Oral Q0600   pantoprazole (PROTONIX) IV  40 mg Intravenous Q12H    predniSONE  5 mg Oral Q breakfast   sodium chloride flush  3 mL Intravenous Q12H   tadalafil  40 mg Oral QPM   vitamin B-12  1,000 mcg Oral Daily    Infusions:  sodium chloride      PRN Medications: sodium chloride, acetaminophen **OR** acetaminophen, melatonin, morphine injection, sodium chloride flush, traMADol   Assessment/Plan   1. Pulmonary hypertension/RV failure: Moderate pulmonary arterial hypertension by RHC with PVR 4.8 WU.  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  She has been Opsumit and tadalafil at home, did not tolerate Tyvaso.  Per Dr. Matilde Bash review of recent high resolution CT chest, no significant ILD was present.  Echo on my review showed EF 60-65% with D-shaped septum, moderate RV dilation, mild RV dysfunction.  RHC on 2/8 showed normal PCWP, mildly elevated RA pressure. She is relatively well-compensated. She did get a dose of IV Lasix yesterday, creatinine up to 1.96. - Continue home Opsumit and tadalafil. - Consider Malvin Johns as outpatient.  - Hold diuretics today, hopefully back to po torsemide tomorrow.  2. Chronic hypoxemic respiratory failure: She is on 4-5 L home oxygen.  Per Dr. Matilde Bash review of high resolution CT chest, she does not have significant pulmonary fibrosis.  ?Hypoxemia due to pulmonary hypertension only. She is currently on her baseline 4L home oxygen.  3. Atrial fibrillation: Paroxysmal, she went into AF with RVR on the am of 2/8, started on amiodarone gtt and converted back to NSR. Amiodarone has been stopped.  - Unable to anticoagulate with active GI bleeding. Would aim to restart Eliquis in a week or so if no further bleeding.   - Mildly elevated LFTs, unrelated to amiodarone and trending down.  Amiodarone can be used if needed though would try to avoid long-term with lung disease.    4. GI bleeding: Melena, h/o AVMs.  She has had blood this admission. Hgb up to 10.1.  Deemed to high risk for endoscopy by GI.  -  Eliquis on hold, would probably aim to hold a week or so and try to restart (indication for PE/DVT and atrial fibrillation).  5. PE: H/o DVT/PE, on Eliquis at home.  This admission, V/Q scan equivocal for PE.  - Off Eliquis with GI bleeding.  - Planning for  IVC filter today.  6. AKI on CKD Stage 3: Creatinine mildly higher at 1.96 today, keep diuretic on hold.   Length of Stay: 2  Loralie Champagne, MD  06/02/2021, 8:21 AM  Advanced Heart Failure Team Pager [phone] (M-F; 7a - 5p)  Please contact [facility] for night-coverage after hours (5p -7a ) and weekends on [email or web address]

## 2021-06-02 NOTE — Plan of Care (Signed)

## 2021-06-02 NOTE — Progress Notes (Signed)
NAME:  Meghan HALFHILL, MRN:  [id number], DOB:  25-Dec-1935, LOS: 2 ADMISSION DATE:  05/30/2021, CONSULTATION DATE:  05/31/2021 REFERRING MD:  Dr. Candiss Norse, CHIEF COMPLAINT:  Dyspnea    History of Present Illness:  Meghan Wise is a 86 y.o. female with a extensive PMH that includes but isn't limited to; Severe pulmonary hypertension, pulmonary and renal scleroderma, prior PE and DVT anticoagulated on Eliquis, chronic hypoxic respiratory failure on 4L [place], CKD stage IV, HLD, HTN, and CHF who presented to the ED evening of 2/6 for concern of low hemoglobin, increased SOB, and increased weakness. She was recently treated with Z-Pak and prednisone taper per her primary. Patient was seen by Dr. Marshell Garfinkel 2/3 for possible establishment of care with the [facility] office. At that time recommendations were made to increased diuretics and follow back up with primary pulmonologist at [facility]  Patient was then seen by primary pulmonology at [facility] morning of 2/6 and he recommended admission at that time but patient wished to come to [facility] because"I live in the area". He primary pulmonologist recommended high resolution CT (as did ours) and possible left heart cath to better characterize cardiac function. At [facility] patient was also seen with worsened anemia (hgb dropped from 8-6.8) in the setting of anticoagulation and new melena per patient.   On ED arrival patient was seen hemodynamically stable with mild tachypnea. Labwork on admit significant for hgb 7.4, WBC 11.2, BNP 562, creatinine 1.86, GRF 31. FOBT was positive and GI was consulted, anticoagulation was stopped and blood transfusion was ordered per GI. She was admitted per Hospitilist and pulmonary was consulted for additional care   Pertinent  Medical History  Pulmonary hypertension, pulmonary and renal scleroderma, prior PE and DVT anticoagulated on Eliquis, chronic hypoxic respiratory failure on 4L [place], CKD stage IV, HLD, HTN, and CHF  Significant  Hospital Events: Including procedures, antibiotic start and stop dates in addition to other pertinent events   2/6 admitted for worsened anemia, malaise, and SOB 2/8 66m Lasix. Heart cath   2/9 back in afib RVR. Getting amio. Was feeling better before afib rvr recurred. Holding diuresis with Cr bump   Images  2/6 High resolution chest CT > Limited study demonstrating no definitive evidence to suggest interstitial lung disease. Mild air trapping, suggesting small airways disease. Cardiomegaly with evidence of interstitial pulmonary edema. Aortic atherosclerosis 2/8 RHC- moderate PA HTN with PVR 4.8. Normal PCWP. Mildly elevated RA, RVEDP. Preserved CO   Interim History / Subjective:   S/p IVC filter placement this morning  Back in Afib RVR after case   HF restarting IV amio   Hgb stable at 10  Cr bump tp 1.96 after receiving lasix 414mIV yesterday    Feeling very uncomfortable with recurring Afib RVR   Objective   Blood pressure (!) 132/57, pulse (!) 109, temperature 98.6 F (37 C), temperature source Oral, resp. rate (!) 25, height _0  (1.473 m), weight 40 kg, SpO2 97 %.        Intake/Output Summary (Last 24 hours) at 06/02/2021 1053 Last data filed at 06/01/2021 1700 Gross per 24 hour  Intake 556.38 ml  Output 300 ml  Net 256.38 ml    Filed Weights   05/30/21 1908  Weight: 40 kg    Examination:  Gen:  Chronically and acutely ill appearing elderly F seated in bed in mild distress  HEENT:  NCAT pink mm anicteric sclera  Lungs: symmetrical chest expansion, incr RR. No adventitious  sounds  CV:  irir s1s2 cap refill brisk  Abd:  Soft ndnt + bowel sounds  Ext: No acute joint deformity    Skin:  c/d/w no rash  Neuro: AAOx3 following commands    Resolved Hospital Problem list     Assessment & Plan:   GIB in setting of anticoagulation P -holding eliquis -trend H/H, transfuse as needed   Acute on chronic respiratory failure with hypoxia  Pulmonary  Hypertension Chronic diastolic HF  New onset Afib RVR  Dyspnea preceded by melena.  There is no evidence of interstitial lung disease on CT RHC with moderate PAH, normal PCWP, Mildly elevated RA and RVEDP  Diuresed 2/8  P -IV amio -K goal >4 mag goal > 2 -cont home opsumit, tadalafil -consider outpt oral prostacyclin. Has not tolerated tyvaso previously  -cont O2 as needed -IS, flutter, mobility   DVT likely PE She has equivocal VQ scan which may indicate a new PE.  Options are limited due to GI bleed S/p IVC filter placement 2/9 P -holding eliquis   AKI on CKD 3 P -holding diuresis 2/9 per cards  Elevated LFTs -trend on amio   Hypokalemia -replace, goal>4  Goals of Care DNR status Appreciate input from palliative care.   DNR  Best Practice (right click and "Reselect all SmartList Selections" daily)  Per primary   Critical care time: NA   Eliseo Gum MSN, AGACNP-BC [place] for pager  06/02/2021, 10:53 AM

## 2021-06-02 NOTE — Procedures (Signed)
Pre procedural Dx: DVT with contraindication to anticoagulation Post Procedural Dx: Same  Successful placement of an infrarenal IVC filter via the right internal jugular vein.  EBL: Trace Complications: None immediate  Given patient's advanced age and multiple medical comorbidities, the IVC filter will be considered a permanent device and she will NOT be actively followed by the interventional radiology service for retrieval.   Ronny Bacon, MD Pager #: [phone]

## 2021-06-02 NOTE — Progress Notes (Signed)
Pt in afib rhythm upon returning from procedure, with erratic rates up to 140s and 150. Pt said she feels "heart racing." RN spoke with PA face-to-face. PA spoke with MD, who ordered amio bolus and gtt. RN started [employer] and is continuing to monitor.   Pt back in NSR with rates in 70s. Pt said she is feeling better. RN continuing to monitor.

## 2021-06-02 NOTE — Care Management Important Message (Signed)
Important Message  Patient Details  Name: Meghan Wise MRN: [id number] Date of Birth: February 20, 1936   Medicare Important Message Given:  Yes     [name] 06/02/2021, 3:39 PM

## 2021-06-02 NOTE — Progress Notes (Signed)
PROGRESS NOTE                                                                                                                                                                                                             Patient Demographics:    Meghan Wise, is a 86 y.o. female, DOB - 12-23-1935, WFU:[id number]  Outpatient Primary MD for the patient is Collene Leyden, MD    LOS - 2  Admit date - 05/30/2021    Chief Complaint  Patient presents with   Abnormal Lab       Brief Narrative (HPI from H&P)   86 y.o. female with medical history significant of hypertension, hyperlipidemia, hypothyroidism, CKD stage IIIb, scleroderma, chronic diastolic CHF, pulmonary hypertension, chronic respiratory failure on 4 L oxygen, simply diagnosed with DVT and possible PE and was placed on Eliquis.  She claims that a few days ago she had dark-colored stool which was almost black in color, subsequently went to see a pulmonologist for her underlying pulmonary hypertension where she had blood work and was found to have hemoglobin of 6.8 and then asked to come to the ER at [facility] for further evaluation of GI bleed and acute anemia.  As well she was noted to have A-fib with RVR on 06/01/2021.  Where she did convert to NSR without any intervention, she had a right heart cath on 2/8   Subjective:    Meghan Wise today reports dyspnea at baseline, no fever, no chills, no palpitation, she maintained normal sinus rhythm overnight.     Assessment  & Plan :    Acute on chronic hypoxic respiratory failure -Known underlying pulmonary hypertension, chronic respiratory failure on 5 L nasal cannula -Has worsened due to anemia in the setting of acute GI bleed.  -PCCM . cardiology input greatly appreciated. -He is currently back at her baseline of 4 L nasal cannula.  -No evidence of ILD on CT chest  Acute upper GI blood loss related anemia.   -She was  recently placed on Eliquis and had melanotic stools few days ago, she is s/p 1 packed RBC transfusion last night with stabilization of H&H, Eliquis held, IV PPI continue.  Case discussed with Dr. Benson Norway.  No invasive procedure right now due to her frail status.  Supportive care and hold Eliquis as long as we can  for the next 7 to 10 days. -Received 1 unit PRBC 2/8, hemoglobin stable posttransfusion, continue to monitor daily.    History of DVT in November 2022.  With current DVT, likely PE - Eliquis held due GI bleed . -She has equivocal VQ scan which may indicate a new PE, or extremity Dopplers showing right popliteal vein DVT. -At this point cannot do anticoagulation due to GI bleed, plan for IVC filter today.  Advanced pulmonary hypertension.   - Between 3 to 5 L of nasal cannula oxygen at home.  -Right heart cath 2/8, management.  PCCM and advanced CHF team . - She has been Opsumit and tadalafil at home, did not tolerate Tyvaso.  A-fib with RVR -Back to normal sinus rhythm, no anticoagulation due to GI bleed. -With the round bolus yesterday, recurrent A-fib then can reinitiate.   Transaminitis -Sudden elevation overnight, most likely due to low blood pressure, trending down.  History of chronic diastolic CHF.  Last known EF in November 2022 is 65%.  Cardiology has been consulted, currently appears euvolemic, poor candidate for left or right heart cath due to multiple comorbidities and extremely frail status.  Continue home dose Demadex for now. -Appears to be well compensated on right heart cath, diuresis per cardiology.  Dyslipidemia.  On statin.  Severe protein calorie malnutrition due to underlying pulmonary hypertension, scleroderma and advanced age.  Supportive care.  She is DNR and will involve palliative care also for goals of care.  Hypertension.  On Norvasc.  Hypothyroidism.  On Synthroid.  History of scleroderma.  Supportive care.  AKI on CKD stage IIIb.  Baseline  creatinine around 1.6.  Follows with Dr. Marval Regal.  Thinning up to 1.96 today, continue to monitor and hold diuresis.   Ongoing left shoulder pain.   -X-ray with no acute findings  Hypokalemia -Replete, target potassium > 4 due to A-fib      Condition - Extremely Guarded  Family Communication  :  D/W patient, none at bedside  Code Status :  DNR  Consults  :  Cards, PCCM, GI, Pall Care  PUD Prophylaxis : PPI   Procedures  :     TTE  Leg [place]  VQ  CT High Res - 1. Limited study demonstrating no definitive evidence to suggest interstitial lung disease. 2. There is cardiomegaly with evidence of interstitial pulmonary edema; imaging findings suggestive of congestive heart failure, as above. 3. Aortic atherosclerosis, in addition to left main and three-vessel coronary artery disease. 4. There are calcifications of the aortic valve. Echocardiographic correlation for evaluation of potential valvular dysfunction may be warranted if clinically indicated. 5. Mild air trapping, suggesting small airways disease. Aortic Atherosclerosis      Disposition Plan  :    Status is: Inpatient Remains inpatient appropriate because: GI Bleed  DVT Prophylaxis  :    Lab Results  Component Value Date   PLT 356 06/02/2021    Diet :  Diet Order             Diet NPO time specified Except for: Sips with Meds  Diet effective midnight                    Inpatient Medications  Scheduled Meds:  sodium chloride   Intravenous Once   ambrisentan  10 mg Oral Daily   atorvastatin  20 mg Oral Daily   ferrous sulfate  325 mg Oral Daily   folic acid  921 mcg Oral QPM   levothyroxine  50 mcg Oral Q0600   lidocaine       pantoprazole (PROTONIX) IV  40 mg Intravenous Q12H   predniSONE  5 mg Oral Q breakfast   sodium chloride flush  3 mL Intravenous Q12H   tadalafil  40 mg Oral QPM   vitamin B-12  1,000 mcg Oral Daily   Continuous Infusions:  sodium chloride     PRN Meds:.sodium chloride,  acetaminophen **OR** acetaminophen, iohexol, melatonin, morphine injection, sodium chloride flush, traMADol  Antibiotics  :    Anti-infectives (From admission, onward)    None         Phillips Climes M.D on 06/02/2021 at 10:04 AM  To page go to [email or web address]   [facility] -  Office  [phone]  See all Orders from today for further details    Objective:   Vitals:   06/02/21 0930 06/02/21 0935 06/02/21 0945 06/02/21 0950  BP: 127/69 125/60 122/71 (!) 132/57  Pulse: (!) 136 91 (!) 112 (!) 109  Resp: (!) 31 (!) 29 (!) 29 (!) 25  Temp:      TempSrc:      SpO2: 98% 98% 97% 97%  Weight:      Height:        Wt Readings from Last 3 Encounters:  05/30/21 40 kg  05/27/21 40.6 kg  03/23/21 38.7 kg     Intake/Output Summary (Last 24 hours) at 06/02/2021 1004 Last data filed at 06/01/2021 1700 Gross per 24 hour  Intake 556.38 ml  Output 300 ml  Net 256.38 ml     Physical Exam  Awake Alert, Oriented X 3,, frail, thin appearing Symmetrical Chest wall movement, Good air movement bilaterally, CTAB RRR,No Gallops,Rubs or new Murmurs, No Parasternal Heave +ve B.Sounds, Abd Soft, No tenderness, No rebound - guarding or rigidity. No Cyanosis, Clubbing or edema, No new Rash or bruise        Data Review:    CBC Recent Labs  Lab 05/30/21 1931 05/30/21 2213 05/31/21 0257 05/31/21 0920 05/31/21 1937 06/01/21 0133 06/01/21 1415 06/01/21 1418 06/02/21 0445  WBC 11.2*  --  10.5 12.6* 12.5* 9.9  --   --  8.2  HGB 7.4*   < > 8.9* 7.4* 8.1* 7.3* 10.2* 10.2* 10.1*  HCT 23.8*   < > 28.3* 23.2* 26.1* 23.1* 30.0* 30.0* 30.8*  PLT 445*  --  412* 363 382 339  --   --  356  MCV 96.7  --  96.9 95.5 94.9 94.7  --   --  86.0  MCH 30.1  --  30.5 30.5 29.5 29.9  --   --  28.2  MCHC 31.1  --  31.4 31.9 31.0 31.6  --   --  32.8  RDW 14.6  --  14.1 14.1 14.5 14.6  --   --  21.9*  LYMPHSABS 0.5*  --   --   --   --  0.6*  --   --  0.6*  MONOABS 0.8  --   --   --   --  1.1*   --   --  0.9  EOSABS 0.1  --   --   --   --  0.1  --   --  0.3  BASOSABS 0.0  --   --   --   --  0.0  --   --  0.0   < > = values in this interval not displayed.    Electrolytes Recent Labs  Lab 05/30/21 1931 05/30/21  2213 05/31/21 0257 06/01/21 0133 06/01/21 0622 06/01/21 1130 06/01/21 1415 06/01/21 1418 06/02/21 0445  NA 143 142 145 137  --   --  140 140 140  K 3.9 3.6 4.1 4.0  --   --  3.7 3.7 3.4*  CL 107 107 110 103  --   --   --   --  105  CO2 23  --  24 22  --   --   --   --  21*  GLUCOSE 184* 128* 90 97  --   --   --   --  91  BUN 48* 46* 46* 41*  --   --   --   --  52*  CREATININE 1.65* 1.50* 1.52* 1.69*  --   --   --   --  1.96*  CALCIUM 8.1*  --  8.0* 7.3*  --   --   --   --  7.1*  AST 28  --   --  359*  --   --   --   --  109*  ALT 26  --   --  242*  --   --   --   --  155*  ALKPHOS 62  --   --  210*  --   --   --   --  176*  BILITOT 0.3  --   --  0.8  --   --   --   --  0.5  ALBUMIN 3.0*  --   --  2.5*  --   --   --   --  2.4*  MG  --   --  2.4 2.3  --   --   --   --  2.3  LATICACIDVEN  --   --   --   --  1.7 1.6  --   --   --   TSH  --   --   --   --  3.596  --   --   --   --   BNP 562.0*  --  785.4* 1,227.1*  --   --   --   --  1,361.6*    ------------------------------------------------------------------------------------------------------------------ No results for input(s): CHOL, HDL, LDLCALC, TRIG, CHOLHDL, LDLDIRECT in the last 72 hours.  No results found for: HGBA1C  Recent Labs    06/01/21 0622  TSH 3.596   ------------------------------------------------------------------------------------------------------------------ ID Labs Recent Labs  Lab 05/30/21 1931 05/30/21 2213 05/31/21 0257 05/31/21 0920 05/31/21 1937 06/01/21 0133 06/01/21 0622 06/01/21 1130 06/02/21 0445  WBC 11.2*  --  10.5 12.6* 12.5* 9.9  --   --  8.2  PLT 445*  --  412* 363 382 339  --   --  356  LATICACIDVEN  --   --   --   --   --   --  1.7 1.6  --   CREATININE  1.65* 1.50* 1.52*  --   --  1.69*  --   --  1.96*   Cardiac Enzymes No results for input(s): CKMB, TROPONINI, MYOGLOBIN in the last 168 hours.  Invalid input(s): CK   Radiology Reports DG Chest 2 View  Result Date: 05/30/2021 CLINICAL DATA:  Shortness of breath. EXAM: CHEST - 2 VIEW COMPARISON:  Chest x-ray 05/20/2021.  Chest CT 12/24/2020. FINDINGS: Scarring is again seen in the bilateral lung apices. There is a stable calcified granuloma in the left lung apex. There is no new focal lung infiltrate, pleural effusion or pneumothorax  identified. Cardiomediastinal silhouette is within normal limits. No acute fractures are seen. IMPRESSION: No evidence for pneumonia or edema. Stable chronic changes in the lung apices. Electronically Signed   By: Ronney Asters M.D.   On: 05/30/2021 21:31   CARDIAC CATHETERIZATION  Result Date: 06/02/2021 1. Moderate pulmonary arterial hypertension with PVR 4.8 WU. 2. Normal PCWP. 3. Mildly elevated RA and RVEDP. 4. Preserved cardiac output. Continue home PH medications.  Will give 1 dose of Lasix 40 mg IV.   CT Chest High Resolution  Result Date: 05/31/2021 CLINICAL DATA:  86 year old female with history of hypoxemia. EXAM: CT CHEST WITHOUT CONTRAST TECHNIQUE: Multidetector CT imaging of the chest was performed following the standard protocol without intravenous contrast. High resolution imaging of the lungs, as well as inspiratory and expiratory imaging, was performed. RADIATION DOSE REDUCTION: This exam was performed according to the departmental dose-optimization program which includes automated exposure control, adjustment of the mA and/or kV according to patient size and/or use of iterative reconstruction technique. COMPARISON:  Chest CT 12/24/2020. FINDINGS: Cardiovascular: Heart size is mildly enlarged. There is no significant pericardial fluid, thickening or pericardial calcification. There is aortic atherosclerosis, as well as atherosclerosis of the great  vessels of the mediastinum and the coronary arteries, including calcified atherosclerotic plaque in the left main, left anterior descending, left circumflex and right coronary arteries. Calcifications of the aortic valve. Mediastinum/Nodes: No pathologically enlarged mediastinal or hilar lymph nodes. Please note that accurate exclusion of hilar adenopathy is limited on noncontrast CT scans. Esophagus is unremarkable in appearance. No axillary lymphadenopathy. Lungs/Pleura: Study is limited by considerable patient respiratory motion. With these limitations in mind, high-resolution images demonstrates some patchy areas of mild ground-glass attenuation and predominantly interlobular septal thickening throughout the lungs bilaterally, favored to reflect a background of interstitial pulmonary edema. No definitive regions of traction bronchiectasis or honeycombing are noted. There is some bilateral apical pleuroparenchymal thickening and architectural distortion, most compatible with areas of chronic post infectious or inflammatory scarring. No confluent consolidative airspace disease. No pleural effusions. No definite suspicious appearing pulmonary nodules or masses are noted. Inspiratory and expiratory imaging is considered nearly nondiagnostic, but suggests the presence of some mild air trapping. Upper Abdomen: Aortic atherosclerosis. Musculoskeletal: There are no aggressive appearing lytic or blastic lesions noted in the visualized portions of the skeleton. IMPRESSION: 1. Limited study demonstrating no definitive evidence to suggest interstitial lung disease. 2. There is cardiomegaly with evidence of interstitial pulmonary edema; imaging findings suggestive of congestive heart failure, as above. 3. Aortic atherosclerosis, in addition to left main and three-vessel coronary artery disease. 4. There are calcifications of the aortic valve. Echocardiographic correlation for evaluation of potential valvular dysfunction may  be warranted if clinically indicated. 5. Mild air trapping, suggesting small airways disease. Aortic Atherosclerosis (ICD10-I70.0). Electronically Signed   By: Vinnie Langton M.D.   On: 05/31/2021 04:47   NM Pulmonary Perf and Vent  Result Date: 05/31/2021 CLINICAL DATA:  Back pain, chest pain, shortness of breath EXAM: NUCLEAR MEDICINE PERFUSION LUNG SCAN TECHNIQUE: Perfusion images were obtained in multiple projections after intravenous injection of radiopharmaceutical. Ventilation scans intentionally deferred if perfusion scan and chest x-ray adequate for interpretation during COVID 19 epidemic. RADIOPHARMACEUTICALS:  Four mCi Tc-35mMAA IV COMPARISON:  Chest radiograph done earlier today FINDINGS: There is small linear wedge-shaped area of decreased tracer uptake in the posterior right upper lung fields. No other focal abnormality is seen. Evaluation is limited without ventilation images. IMPRESSION: There is small linear wedge shaped area  of decreased uptake in the posterior right upper lung fields. Study is indeterminate to evaluate for pulmonary embolism. If there is continued clinical suspicion for pulmonary embolism CT pulmonary angiogram and possibly venous Doppler examination of lower extremities may be considered. Electronically Signed   By: Elmer Picker M.D.   On: 05/31/2021 16:06   DG CHEST PORT 1 VIEW  Result Date: 06/01/2021 CLINICAL DATA:  Shortness of breath and tachycardia EXAM: PORTABLE CHEST 1 VIEW COMPARISON:  Yesterday FINDINGS: Cardiomegaly and vascular pedicle widening. Chronic interstitial coarsening and reticulation. Recent high resolution chest CT. No effusion or pneumothorax. IMPRESSION: Cardiomegaly and vascular congestion/interstitial edema. No new abnormality. Electronically Signed   By: Jorje Guild M.D.   On: 06/01/2021 06:01   DG Chest Port 1 View  Result Date: 05/31/2021 CLINICAL DATA:  86 year old female with history of shortness of breath. EXAM: PORTABLE  CHEST 1 VIEW COMPARISON:  Chest x-ray 05/30/2021. FINDINGS: Mild diffuse interstitial prominence and diffuse peribronchial cuffing. Lung volumes are normal. No consolidative airspace disease. No pleural effusions. No pneumothorax. Cephalization of the pulmonary vasculature. Heart size is mildly enlarged. The patient is rotated to the right on today's exam, resulting in distortion of the mediastinal contours and reduced diagnostic sensitivity and specificity for mediastinal pathology. Atherosclerotic calcifications in the thoracic aorta. IMPRESSION: 1. The appearance the chest suggests mild congestive heart failure, as above. Electronically Signed   By: Vinnie Langton M.D.   On: 05/31/2021 07:43   DG Shoulder Left Port  Result Date: 05/31/2021 CLINICAL DATA:  Left shoulder pain EXAM: LEFT SHOULDER COMPARISON:  None. FINDINGS: There is no evidence of fracture or dislocation. No significant arthropathy or other focal bone abnormality. Soft tissues are unremarkable. IMPRESSION: No acute osseous abnormality Electronically Signed   By: Yetta Glassman M.D.   On: 05/31/2021 10:37   VAS [place] LOWER EXTREMITY VENOUS (DVT)  Result Date: 05/31/2021  Lower Venous DVT Study Patient Name:  SYMIAH NOWOTNY  Date of Exam:   05/31/2021 Medical Rec #: [id number]          Accession #:    [id number] Date of Birth: 04/04/1936           Patient Gender: F Patient Age:   44 years Exam Location:  [facility] Procedure:      VAS [place] LOWER EXTREMITY VENOUS (DVT) Referring Phys: Loree Fee HARRIS --------------------------------------------------------------------------------  Indications: Edema.  Comparison Study: 03/11/2021 bilateral lower extremity venous duplex- age                   indeterminate DVT right popliteal vein. Performing Technologist: Maudry Mayhew MHA, RDMS, RVT, RDCS  Examination Guidelines: A complete evaluation includes B-mode imaging, spectral Doppler, color Doppler, and power Doppler as needed of all  accessible portions of each vessel. Bilateral testing is considered an integral part of a complete examination. Limited examinations for reoccurring indications may be performed as noted. The reflux portion of the exam is performed with the patient in reverse Trendelenburg.  +---------+---------------+---------+-----------+----------+-----------------+  RIGHT     Compressibility Phasicity Spontaneity Properties Thrombus Aging     +---------+---------------+---------+-----------+----------+-----------------+  CFV       Full            Yes       Yes                                       +---------+---------------+---------+-----------+----------+-----------------+  SFJ  Full                                                                +---------+---------------+---------+-----------+----------+-----------------+  FV Prox   Full                                                                +---------+---------------+---------+-----------+----------+-----------------+  FV Mid    Full                                                                +---------+---------------+---------+-----------+----------+-----------------+  FV Distal Full                                                                +---------+---------------+---------+-----------+----------+-----------------+  PFV       Full                                                                +---------+---------------+---------+-----------+----------+-----------------+  POP       Partial         Yes       Yes                    Age Indeterminate  +---------+---------------+---------+-----------+----------+-----------------+  PTV       Full                                                                +---------+---------------+---------+-----------+----------+-----------------+  PERO      Full                                                                +---------+---------------+---------+-----------+----------+-----------------+    +---------+---------------+---------+-----------+----------+--------------+  LEFT      Compressibility Phasicity Spontaneity Properties Thrombus Aging  +---------+---------------+---------+-----------+----------+--------------+  CFV       Full            Yes       Yes                                    +---------+---------------+---------+-----------+----------+--------------+  SFJ       Full                                                             +---------+---------------+---------+-----------+----------+--------------+  FV Prox   Full                                                             +---------+---------------+---------+-----------+----------+--------------+  FV Mid    Full                                                             +---------+---------------+---------+-----------+----------+--------------+  FV Distal Full                                                             +---------+---------------+---------+-----------+----------+--------------+  PFV       Full                                                             +---------+---------------+---------+-----------+----------+--------------+  POP       Full            Yes       Yes                                    +---------+---------------+---------+-----------+----------+--------------+  PTV       Full                                                             +---------+---------------+---------+-----------+----------+--------------+  PERO      Full                                                             +---------+---------------+---------+-----------+----------+--------------+     Summary: RIGHT: - Findings consistent with age indeterminate deep vein thrombosis involving the right popliteal vein. - No cystic structure found in the popliteal fossa.  LEFT: - Findings appear essentially unchanged compared to previous examination. - There is no evidence of deep vein thrombosis in the lower extremity.  - No cystic structure  found in the popliteal fossa.  *See table(s) above for measurements and observations. Electronically signed by Servando Snare MD on 05/31/2021 at 2:34:50 PM.    Final    ECHOCARDIOGRAM LIMITED  Result Date: 05/31/2021    ECHOCARDIOGRAM LIMITED REPORT   Patient Name:   JAQUETTA CURRIER Date of Exam: 05/31/2021 Medical Rec #:  [id number]         Height:       58.0 in Accession #:    [id number]        Weight:       88.2 lb Date of Birth:  1935/06/05          BSA:          1.286 m Patient Age:    60 years          BP:           128/72 mmHg Patient Gender: F                 HR:           65 bpm. Exam Location:  Inpatient Procedure: Limited Echo, Limited Color Doppler and Cardiac Doppler Indications:    dyspnea  History:        Patient has prior history of Echocardiogram examinations, most                 recent 03/12/2021. Raynaud's. Scleroderma. Chronic kidney                 disease., Signs/Symptoms:Dyspnea; Risk Factors:Hypertension.  Sonographer:    Johny Chess RDCS Referring Phys: [place]  1. Left ventricular ejection fraction, by estimation, is 60 to 65%. The left ventricle has normal function. The left ventricle has no regional wall motion abnormalities. Left ventricular diastolic parameters are consistent with Grade II diastolic dysfunction (pseudonormalization). Elevated left atrial pressure. There is the interventricular septum is flattened in systole, consistent with right ventricular pressure overload.  2. Right ventricular systolic function is normal. There is severely elevated pulmonary artery systolic pressure. The estimated right ventricular systolic pressure is 64.3 mmHg.  3. The mitral valve is normal in structure. No evidence of mitral valve regurgitation. No evidence of mitral stenosis.  4. Tricuspid valve regurgitation is moderate to severe.  5. The aortic valve is normal in structure. Aortic valve regurgitation is mild. No aortic stenosis is present.  6. The inferior  vena cava is normal in size with greater than 50% respiratory variability, suggesting right atrial pressure of 3 mmHg. FINDINGS  Left Ventricle: Left ventricular ejection fraction, by estimation, is 60 to 65%. The left ventricle has normal function. The left ventricle has no regional wall motion abnormalities. The left ventricular internal cavity size was normal in size. There is  no left ventricular hypertrophy. The interventricular septum is flattened in systole, consistent with right ventricular pressure overload. Left ventricular diastolic parameters are consistent with Grade II diastolic dysfunction (pseudonormalization). Elevated left atrial pressure. Right Ventricle: No increase in right ventricular wall thickness. Right ventricular systolic function is normal. There is severely elevated pulmonary artery systolic pressure. The tricuspid regurgitant velocity is 4.01 m/s, and with an assumed right atrial pressure of 3 mmHg, the estimated right ventricular systolic pressure is 83.8 mmHg. Left Atrium: Left atrial size was normal in size. Right Atrium: Right atrial size was normal in size. Pericardium: There is no evidence of pericardial effusion. Mitral Valve: The mitral valve is normal in structure. There is mild thickening of the mitral  valve leaflet(s). No evidence of mitral valve stenosis. Tricuspid Valve: The tricuspid valve is normal in structure. Tricuspid valve regurgitation is moderate to severe. No evidence of tricuspid stenosis. Aortic Valve: The aortic valve is normal in structure. Aortic valve regurgitation is mild. Aortic regurgitation PHT measures 503 msec. No aortic stenosis is present. Pulmonic Valve: The pulmonic valve was normal in structure. Pulmonic valve regurgitation is trivial. No evidence of pulmonic stenosis. Aorta: The aortic root is normal in size and structure. Venous: The inferior vena cava is normal in size with greater than 50% respiratory variability, suggesting right atrial  pressure of 3 mmHg. IAS/Shunts: No atrial level shunt detected by color flow Doppler. LEFT VENTRICLE PLAX 2D LVIDd:         4.06 cm Diastology LVIDs:         2.18 cm LV e' medial:    5.55 cm/s LV PW:         1.10 cm LV E/e' medial:  15.2 LV IVS:        0.75 cm LV e' lateral:   5.33 cm/s                        LV E/e' lateral: 15.9  LEFT ATRIUM         Index LA diam:    3.20 cm 2.49 cm/m  AORTIC VALVE AI PHT:      503 msec  AORTA Ao Asc diam: 3.10 cm MITRAL VALVE               TRICUSPID VALVE MV Area (PHT): 3.53 cm    TR Peak grad:   64.3 mmHg MV Decel Time: 215 msec    TR Vmax:        401.00 cm/s MV E velocity: 84.50 cm/s MV A velocity: 85.10 cm/s MV E/A ratio:  0.99 Mihai Croitoru MD Electronically signed by Sanda Klein MD Signature Date/Time: 05/31/2021/1:23:45 PM    Final

## 2021-06-02 NOTE — Progress Notes (Signed)
Was called to bedside by RN. Pt just returned from getting IVC filter. Back in Afib w/ RVR 140s. SBP 120s. D/w Dr. Aundra Dubin. Restart IV amio.  Supp K (3.4) Mg ok 2.3  Will order daily CMETs to monitor hepatic function.   Lyda Jester, PA-C 06/02/2021

## 2021-06-03 DIAGNOSIS — I5032 Chronic diastolic (congestive) heart failure: Secondary | ICD-10-CM | POA: Diagnosis not present

## 2021-06-03 DIAGNOSIS — R0600 Dyspnea, unspecified: Secondary | ICD-10-CM | POA: Diagnosis not present

## 2021-06-03 LAB — COMPREHENSIVE METABOLIC PANEL
ALT: 112 U/L — ABNORMAL HIGH (ref 0–44)
AST: 58 U/L — ABNORMAL HIGH (ref 15–41)
Albumin: 2.3 g/dL — ABNORMAL LOW (ref 3.5–5.0)
Alkaline Phosphatase: 175 U/L — ABNORMAL HIGH (ref 38–126)
Anion gap: 12 (ref 5–15)
BUN: 45 mg/dL — ABNORMAL HIGH (ref 8–23)
CO2: 20 mmol/L — ABNORMAL LOW (ref 22–32)
Calcium: 7 mg/dL — ABNORMAL LOW (ref 8.9–10.3)
Chloride: 108 mmol/L (ref 98–111)
Creatinine, Ser: 1.81 mg/dL — ABNORMAL HIGH (ref 0.44–1.00)
GFR, Estimated: 27 mL/min — ABNORMAL LOW (ref >60)
Glucose, Bld: 95 mg/dL (ref 70–99)
Potassium: 4.2 mmol/L (ref 3.5–5.1)
Sodium: 140 mmol/L (ref 135–145)
Total Bilirubin: 0.5 mg/dL (ref 0.3–1.2)
Total Protein: 5.6 g/dL — ABNORMAL LOW (ref 6.5–8.1)

## 2021-06-03 LAB — MAGNESIUM: Magnesium: 2.1 mg/dL (ref 1.7–2.4)

## 2021-06-03 LAB — CBC WITH DIFFERENTIAL/PLATELET
Abs Immature Granulocytes: 0.04 10*3/uL (ref 0.00–0.07)
Basophils Absolute: 0 10*3/uL (ref 0.0–0.1)
Basophils Relative: 0 %
Eosinophils Absolute: 0.2 10*3/uL (ref 0.0–0.5)
Eosinophils Relative: 2 %
HCT: 29.4 % — ABNORMAL LOW (ref 36.0–46.0)
Hemoglobin: 9.3 g/dL — ABNORMAL LOW (ref 12.0–15.0)
Immature Granulocytes: 1 %
Lymphocytes Relative: 8 %
Lymphs Abs: 0.6 10*3/uL — ABNORMAL LOW (ref 0.7–4.0)
MCH: 27.7 pg (ref 26.0–34.0)
MCHC: 31.6 g/dL (ref 30.0–36.0)
MCV: 87.5 fL (ref 80.0–100.0)
Monocytes Absolute: 0.7 10*3/uL (ref 0.1–1.0)
Monocytes Relative: 10 %
Neutro Abs: 5.5 10*3/uL (ref 1.7–7.7)
Neutrophils Relative %: 79 %
Platelets: 309 10*3/uL (ref 150–400)
RBC: 3.36 MIL/uL — ABNORMAL LOW (ref 3.87–5.11)
RDW: 21.7 % — ABNORMAL HIGH (ref 11.5–15.5)
WBC: 6.9 10*3/uL (ref 4.0–10.5)
nRBC: 0 % (ref 0.0–0.2)

## 2021-06-03 LAB — BRAIN NATRIURETIC PEPTIDE: B Natriuretic Peptide: 1405 pg/mL — ABNORMAL HIGH (ref 0.0–100.0)

## 2021-06-03 MED ORDER — AMIODARONE HCL 200 MG PO TABS
200.0000 mg | ORAL_TABLET | Freq: Two times a day (BID) | ORAL | Status: DC
  Administered 2021-06-03 – 2021-06-05 (×5): 200 mg via ORAL
  Filled 2021-06-03 (×5): qty 1

## 2021-06-03 MED ORDER — TORSEMIDE 20 MG PO TABS
10.0000 mg | ORAL_TABLET | Freq: Every day | ORAL | Status: DC
  Administered 2021-06-03 – 2021-06-05 (×3): 10 mg via ORAL
  Filled 2021-06-03 (×3): qty 1

## 2021-06-03 NOTE — Progress Notes (Signed)
PROGRESS NOTE                                                                                                                                                                                                             Patient Demographics:    Meghan Wise, is a 86 y.o. female, DOB - 08/06/1935, GPC:[id number]  Outpatient Primary MD for the patient is Collene Leyden, MD    LOS - 3  Admit date - 05/30/2021    Chief Complaint  Patient presents with   Abnormal Lab       Brief Narrative (HPI from H&P)    86 y.o. female with medical history significant of hypertension, hyperlipidemia, hypothyroidism, CKD stage IIIb, scleroderma, chronic diastolic CHF, pulmonary hypertension, chronic respiratory failure on 4 L oxygen, simply diagnosed with DVT and possible PE and was placed on Eliquis.  She claims that a few days ago she had dark-colored stool which was almost black in color, subsequently went to see a pulmonologist for her underlying pulmonary hypertension where she had blood work and was found to have hemoglobin of 6.8 and then asked to come to the ER at [facility] for further evaluation of GI bleed and acute anemia.  As well she was noted to have A-fib with RVR on 06/01/2021.  Where she did convert to NSR without any intervention, she had a right heart cath on 2/8   Subjective:    Meghan Wise today denies any complaints, reports her dyspnea at baseline, she had a good night sleep. -She went into A-fib with RVR yesterday where she was started on amnio drip.   Assessment  & Plan :    Acute on chronic hypoxic respiratory failure -Known underlying pulmonary hypertension, chronic respiratory failure on 5 L nasal cannula -Has worsened due to anemia in the setting of acute GI bleed.  -PCCM . cardiology input greatly appreciated. -She is currently back at her baseline of 4 L nasal cannula.  -No evidence of ILD on CT chest  Acute  GI blood loss related anemia.   -She was recently placed on Eliquis and had melanotic stools few days ago, she is s/p 1 packed RBC transfusion last night with stabilization of H&H, Eliquis held, IV PPI continue.  Case discussed with Dr. Benson Norway.  No invasive procedure right now due to her frail status.  Supportive care and hold Eliquis as long as we can for the next 7 days. -Received 1 unit PRBC 2/8, hemoglobin stable posttransfusion, continue to monitor daily.  10.1>>9.3 overnight.  History of DVT in November 2022.  With current DVT, likely PE - Eliquis held due GI bleed . -She has equivocal VQ scan which may indicate a new PE, or extremity Dopplers showing right popliteal vein DVT. -At this point cannot do anticoagulation due to GI bleed, s/p IVC filter 2/9  Advanced pulmonary hypertension.   - Between 3 to 5 L of nasal cannula oxygen at home.  -Right heart cath 2/8, management.  PCCM and advanced CHF team . - She has been Opsumit and tadalafil at home, did not tolerate Tyvaso.  A-fib with RVR - no anticoagulation due to GI bleed. -She is again in A-fib with RVR overnight, resumed on heparin GTT, currently transition to oral, management per CHF team.  Transaminitis -Sudden elevation overnight, most likely due to low blood pressure, trending down.  History of chronic diastolic CHF.  Last known EF in November 2022 is 65%.  Cardiology has been consulted, currently appears euvolemic, poor candidate for left or right heart cath due to multiple comorbidities and extremely frail status.  Continue home dose Demadex for now. -Appears to be well compensated on right heart cath, diuresis per cardiology.  On home dose torsemide as her creatinine has been improving.  Dyslipidemia.  On statin.  Severe protein calorie malnutrition due to underlying pulmonary hypertension, scleroderma and advanced age.  Supportive care.  She is DNR and will involve palliative care also for goals of care.  Hypertension.   On Norvasc.  Hypothyroidism.  On Synthroid.  History of scleroderma.  Supportive care.  AKI on CKD stage IIIb.  Baseline creatinine around 1.6.  Follows with Dr. Marval Regal.  Thinning up to 1.96 today, continue to monitor and hold diuresis.   Ongoing left shoulder pain.   -X-ray with no acute findings  Hypokalemia -Replete, target potassium > 4 due to A-fib      Condition - Extremely Guarded  Family Communication  :  D/W patient, and D/W daughter at bedside  Code Status :  DNR  Consults  :  Cards, PCCM, GI, Pall Care  PUD Prophylaxis : PPI   Procedures  :     TTE  Leg [place]  VQ  CT High Res - 1. Limited study demonstrating no definitive evidence to suggest interstitial lung disease. 2. There is cardiomegaly with evidence of interstitial pulmonary edema; imaging findings suggestive of congestive heart failure, as above. 3. Aortic atherosclerosis, in addition to left main and three-vessel coronary artery disease. 4. There are calcifications of the aortic valve. Echocardiographic correlation for evaluation of potential valvular dysfunction may be warranted if clinically indicated. 5. Mild air trapping, suggesting small airways disease. Aortic Atherosclerosis      Disposition Plan  :    Status is: Inpatient Remains inpatient appropriate because: GI Bleed  DVT Prophylaxis  :    Lab Results  Component Value Date   PLT 309 06/03/2021    Diet :  Diet Order             Diet regular Room service appropriate? Yes; Fluid consistency: Thin  Diet effective now                    Inpatient Medications  Scheduled Meds:  sodium chloride   Intravenous Once   ambrisentan  10 mg Oral Daily   amiodarone  200 mg Oral BID   atorvastatin  20 mg Oral Daily   ferrous sulfate  325 mg Oral Daily   folic acid  242 mcg Oral QPM   levothyroxine  50 mcg Oral Q0600   pantoprazole (PROTONIX) IV  40 mg Intravenous Q12H   predniSONE  5 mg Oral Q breakfast   sodium chloride flush   3 mL Intravenous Q12H   tadalafil  40 mg Oral QPM   torsemide  10 mg Oral Daily   vitamin B-12  1,000 mcg Oral Daily   Continuous Infusions:  sodium chloride     PRN Meds:.sodium chloride, acetaminophen **OR** acetaminophen, ALPRAZolam, iohexol, loperamide, melatonin, morphine injection, sodium chloride flush, traMADol  Antibiotics  :    Anti-infectives (From admission, onward)    None         Phillips Climes M.D on 06/03/2021 at 9:29 AM  To page go to [email or web address]   [facility] -  Office  [phone]  See all Orders from today for further details    Objective:   Vitals:   06/02/21 1939 06/02/21 2324 06/03/21 0300 06/03/21 0811  BP: (!) 141/56 (!) 108/50 113/62 (!) 143/78  Pulse: 64 65 60 68  Resp: (!) _0 Temp: 98.1 F (36.7 C) 97.7 F (36.5 C) 97.7 F (36.5 C) 97.6 F (36.4 C)  TempSrc: Oral Oral Oral Oral  SpO2: 100% 99% 100% 93%  Weight:      Height:        Wt Readings from Last 3 Encounters:  05/30/21 40 kg  05/27/21 40.6 kg  03/23/21 38.7 kg     Intake/Output Summary (Last 24 hours) at 06/03/2021 0929 Last data filed at 06/02/2021 1100 Gross per 24 hour  Intake 3 ml  Output --  Net 3 ml     Physical Exam  Awake Alert, Oriented X 3, frail Symmetrical Chest wall movement, Good air movement bilaterally RRR,No Gallops,Rubs or new Murmurs, No Parasternal Heave +ve B.Sounds, Abd Soft, No tenderness, No rebound - guarding or rigidity. No Cyanosis, Clubbing or edema, No new Rash or bruise         Data Review:    CBC Recent Labs  Lab 05/30/21 1931 05/30/21 2213 05/31/21 0920 05/31/21 1937 06/01/21 0133 06/01/21 1415 06/01/21 1418 06/02/21 0445 06/03/21 0058  WBC 11.2*   < > 12.6* 12.5* 9.9  --   --  8.2 6.9  HGB 7.4*   < > 7.4* 8.1* 7.3* 10.2* 10.2* 10.1* 9.3*  HCT 23.8*   < > 23.2* 26.1* 23.1* 30.0* 30.0* 30.8* 29.4*  PLT 445*   < > 363 382 339  --   --  356 309  MCV 96.7   < > 95.5 94.9 94.7  --   --  86.0  87.5  MCH 30.1   < > 30.5 29.5 29.9  --   --  28.2 27.7  MCHC 31.1   < > 31.9 31.0 31.6  --   --  32.8 31.6  RDW 14.6   < > 14.1 14.5 14.6  --   --  21.9* 21.7*  LYMPHSABS 0.5*  --   --   --  0.6*  --   --  0.6* 0.6*  MONOABS 0.8  --   --   --  1.1*  --   --  0.9 0.7  EOSABS 0.1  --   --   --  0.1  --   --  0.3 0.2  BASOSABS 0.0  --   --   --  0.0  --   --  0.0 0.0   < > = values in this interval not displayed.    Electrolytes Recent Labs  Lab 05/30/21 1931 05/30/21 2213 05/31/21 0257 06/01/21 0133 06/01/21 0622 06/01/21 1130 06/01/21 1415 06/01/21 1418 06/02/21 0445 06/03/21 0058  NA 143 142 145 137  --   --  140 140 140 140  K 3.9 3.6 4.1 4.0  --   --  3.7 3.7 3.4* 4.2  CL 107 107 110 103  --   --   --   --  105 108  CO2 23  --  24 22  --   --   --   --  21* 20*  GLUCOSE 184* 128* 90 97  --   --   --   --  91 95  BUN 48* 46* 46* 41*  --   --   --   --  52* 45*  CREATININE 1.65* 1.50* 1.52* 1.69*  --   --   --   --  1.96* 1.81*  CALCIUM 8.1*  --  8.0* 7.3*  --   --   --   --  7.1* 7.0*  AST 28  --   --  359*  --   --   --   --  109* 58*  ALT 26  --   --  242*  --   --   --   --  155* 112*  ALKPHOS 62  --   --  210*  --   --   --   --  176* 175*  BILITOT 0.3  --   --  0.8  --   --   --   --  0.5 0.5  ALBUMIN 3.0*  --   --  2.5*  --   --   --   --  2.4* 2.3*  MG  --   --  2.4 2.3  --   --   --   --  2.3 2.1  LATICACIDVEN  --   --   --   --  1.7 1.6  --   --   --   --   TSH  --   --   --   --  3.596  --   --   --   --   --   BNP 562.0*  --  785.4* 1,227.1*  --   --   --   --  1,361.6* 1,405.0*    ------------------------------------------------------------------------------------------------------------------ No results for input(s): CHOL, HDL, LDLCALC, TRIG, CHOLHDL, LDLDIRECT in the last 72 hours.  No results found for: HGBA1C  Recent Labs    06/01/21 0622  TSH 3.596    ------------------------------------------------------------------------------------------------------------------ ID Labs Recent Labs  Lab 05/30/21 2213 05/31/21 0257 05/31/21 0920 05/31/21 1937 06/01/21 0133 06/01/21 0622 06/01/21 1130 06/02/21 0445 06/03/21 0058  WBC  --  10.5 12.6* 12.5* 9.9  --   --  8.2 6.9  PLT  --  412* 363 382 339  --   --  356 309  LATICACIDVEN  --   --   --   --   --  1.7 1.6  --   --   CREATININE 1.50* 1.52*  --   --  1.69*  --   --  1.96* 1.81*   Cardiac Enzymes No results for input(s): CKMB, TROPONINI, MYOGLOBIN in the last 168 hours.  Invalid input(s): CK   Radiology Reports DG Chest 2 View  Result  Date: 05/30/2021 CLINICAL DATA:  Shortness of breath. EXAM: CHEST - 2 VIEW COMPARISON:  Chest x-ray 05/20/2021.  Chest CT 12/24/2020. FINDINGS: Scarring is again seen in the bilateral lung apices. There is a stable calcified granuloma in the left lung apex. There is no new focal lung infiltrate, pleural effusion or pneumothorax identified. Cardiomediastinal silhouette is within normal limits. No acute fractures are seen. IMPRESSION: No evidence for pneumonia or edema. Stable chronic changes in the lung apices. Electronically Signed   By: Ronney Asters M.D.   On: 05/30/2021 21:31   IR IVC FILTER PLMT / S&I Burke Keels GUID/MOD SED  Result Date: 06/02/2021 INDICATION: History of scleroderma with associated interstitial lung disease. Persistent DVT despite anticoagulation. Additionally, patient has experienced lower GI bleeding as a sequela of anticoagulation As such, patient presents today for IVC filter placement for caval interruption purposes. Given patient's advanced age and multiple medical comorbidities, the IVC filter will be considered a permanent device and she will NOT be actively followed by the interventional radiology service for retrieval. EXAM: ULTRASOUND GUIDANCE FOR VASCULAR ACCESS IVC CATHETERIZATION AND VENOGRAM IVC FILTER INSERTION COMPARISON:  CT  abdomen and pelvis-05/25/2012 MEDICATIONS: None. ANESTHESIA/SEDATION: Moderate (conscious) sedation was employed during this procedure as administered by the Interventional Radiology RN. A total of Versed 0.5 mg was administered intravenously. Moderate Sedation Time: 10 minutes. The patient's level of consciousness and vital signs were monitored continuously by radiology nursing throughout the procedure under my direct supervision. CONTRAST:  None, CO2 was utilized for the examination given patient's chronic renal insufficiency. FLUOROSCOPY TIME:  1 minute, 6 seconds (7 mGy) COMPLICATIONS: None immediate PROCEDURE: Informed consent was obtained from the patient following explanation of the procedure, risks, benefits and alternatives. The patient understands, agrees and consents for the procedure. All questions were addressed. A time out was performed prior to the initiation of the procedure. Maximal barrier sterile technique utilized including caps, mask, sterile gowns, sterile gloves, large sterile drape, hand hygiene, and Betadine prep. Under sterile condition and local anesthesia, right internal jugular venous access was performed with ultrasound. An ultrasound image was saved and sent to PACS. Over a guidewire, the IVC filter delivery sheath and inner dilator were advanced into the IVC just above the IVC bifurcation. CO2 injection was performed for an IVC venogram. Through the delivery sheath, a retrievable Denali IVC filter was deployed below the level of the renal veins and above the IVC bifurcation. Limited post deployment venacavagram was performed. The delivery sheath was removed and hemostasis was obtained with manual compression. A dressing was applied. The patient tolerated the procedure well without immediate post procedural complication. FINDINGS: The IVC is patent. No evidence of thrombus, stenosis, or occlusion. No variant venous anatomy. Successful placement of the IVC filter below the level of the  renal veins. IMPRESSION: Successful ultrasound and fluoroscopically guided placement of an infrarenal retrievable IVC filter via right jugular approach. PLAN: Due to patient related comorbidities and/or clinical necessity, this IVC filter should be considered a permanent device. This patient will not be actively followed for future filter retrieval. Electronically Signed   By: Sandi Mariscal M.D.   On: 06/02/2021 10:24   CARDIAC CATHETERIZATION  Result Date: 06/02/2021 1. Moderate pulmonary arterial hypertension with PVR 4.8 WU. 2. Normal PCWP. 3. Mildly elevated RA and RVEDP. 4. Preserved cardiac output. Continue home PH medications.  Will give 1 dose of Lasix 40 mg IV.   CT Chest High Resolution  Result Date: 05/31/2021 CLINICAL DATA:  86 year old female with history of  hypoxemia. EXAM: CT CHEST WITHOUT CONTRAST TECHNIQUE: Multidetector CT imaging of the chest was performed following the standard protocol without intravenous contrast. High resolution imaging of the lungs, as well as inspiratory and expiratory imaging, was performed. RADIATION DOSE REDUCTION: This exam was performed according to the departmental dose-optimization program which includes automated exposure control, adjustment of the mA and/or kV according to patient size and/or use of iterative reconstruction technique. COMPARISON:  Chest CT 12/24/2020. FINDINGS: Cardiovascular: Heart size is mildly enlarged. There is no significant pericardial fluid, thickening or pericardial calcification. There is aortic atherosclerosis, as well as atherosclerosis of the great vessels of the mediastinum and the coronary arteries, including calcified atherosclerotic plaque in the left main, left anterior descending, left circumflex and right coronary arteries. Calcifications of the aortic valve. Mediastinum/Nodes: No pathologically enlarged mediastinal or hilar lymph nodes. Please note that accurate exclusion of hilar adenopathy is limited on noncontrast CT  scans. Esophagus is unremarkable in appearance. No axillary lymphadenopathy. Lungs/Pleura: Study is limited by considerable patient respiratory motion. With these limitations in mind, high-resolution images demonstrates some patchy areas of mild ground-glass attenuation and predominantly interlobular septal thickening throughout the lungs bilaterally, favored to reflect a background of interstitial pulmonary edema. No definitive regions of traction bronchiectasis or honeycombing are noted. There is some bilateral apical pleuroparenchymal thickening and architectural distortion, most compatible with areas of chronic post infectious or inflammatory scarring. No confluent consolidative airspace disease. No pleural effusions. No definite suspicious appearing pulmonary nodules or masses are noted. Inspiratory and expiratory imaging is considered nearly nondiagnostic, but suggests the presence of some mild air trapping. Upper Abdomen: Aortic atherosclerosis. Musculoskeletal: There are no aggressive appearing lytic or blastic lesions noted in the visualized portions of the skeleton. IMPRESSION: 1. Limited study demonstrating no definitive evidence to suggest interstitial lung disease. 2. There is cardiomegaly with evidence of interstitial pulmonary edema; imaging findings suggestive of congestive heart failure, as above. 3. Aortic atherosclerosis, in addition to left main and three-vessel coronary artery disease. 4. There are calcifications of the aortic valve. Echocardiographic correlation for evaluation of potential valvular dysfunction may be warranted if clinically indicated. 5. Mild air trapping, suggesting small airways disease. Aortic Atherosclerosis (ICD10-I70.0). Electronically Signed   By: Vinnie Langton M.D.   On: 05/31/2021 04:47   NM Pulmonary Perf and Vent  Result Date: 05/31/2021 CLINICAL DATA:  Back pain, chest pain, shortness of breath EXAM: NUCLEAR MEDICINE PERFUSION LUNG SCAN TECHNIQUE: Perfusion  images were obtained in multiple projections after intravenous injection of radiopharmaceutical. Ventilation scans intentionally deferred if perfusion scan and chest x-ray adequate for interpretation during COVID 19 epidemic. RADIOPHARMACEUTICALS:  Four mCi Tc-51mMAA IV COMPARISON:  Chest radiograph done earlier today FINDINGS: There is small linear wedge-shaped area of decreased tracer uptake in the posterior right upper lung fields. No other focal abnormality is seen. Evaluation is limited without ventilation images. IMPRESSION: There is small linear wedge shaped area of decreased uptake in the posterior right upper lung fields. Study is indeterminate to evaluate for pulmonary embolism. If there is continued clinical suspicion for pulmonary embolism CT pulmonary angiogram and possibly venous Doppler examination of lower extremities may be considered. Electronically Signed   By: PElmer PickerM.D.   On: 05/31/2021 16:06   DG CHEST PORT 1 VIEW  Result Date: 06/01/2021 CLINICAL DATA:  Shortness of breath and tachycardia EXAM: PORTABLE CHEST 1 VIEW COMPARISON:  Yesterday FINDINGS: Cardiomegaly and vascular pedicle widening. Chronic interstitial coarsening and reticulation. Recent high resolution chest CT. No effusion or pneumothorax.  IMPRESSION: Cardiomegaly and vascular congestion/interstitial edema. No new abnormality. Electronically Signed   By: Jorje Guild M.D.   On: 06/01/2021 06:01   DG Chest Port 1 View  Result Date: 05/31/2021 CLINICAL DATA:  86 year old female with history of shortness of breath. EXAM: PORTABLE CHEST 1 VIEW COMPARISON:  Chest x-ray 05/30/2021. FINDINGS: Mild diffuse interstitial prominence and diffuse peribronchial cuffing. Lung volumes are normal. No consolidative airspace disease. No pleural effusions. No pneumothorax. Cephalization of the pulmonary vasculature. Heart size is mildly enlarged. The patient is rotated to the right on today's exam, resulting in distortion of  the mediastinal contours and reduced diagnostic sensitivity and specificity for mediastinal pathology. Atherosclerotic calcifications in the thoracic aorta. IMPRESSION: 1. The appearance the chest suggests mild congestive heart failure, as above. Electronically Signed   By: Vinnie Langton M.D.   On: 05/31/2021 07:43   DG Shoulder Left Port  Result Date: 05/31/2021 CLINICAL DATA:  Left shoulder pain EXAM: LEFT SHOULDER COMPARISON:  None. FINDINGS: There is no evidence of fracture or dislocation. No significant arthropathy or other focal bone abnormality. Soft tissues are unremarkable. IMPRESSION: No acute osseous abnormality Electronically Signed   By: Yetta Glassman M.D.   On: 05/31/2021 10:37   VAS [place] LOWER EXTREMITY VENOUS (DVT)  Result Date: 05/31/2021  Lower Venous DVT Study Patient Name:  DORISSA STINNETTE  Date of Exam:   05/31/2021 Medical Rec #: [id number]          Accession #:    [id number] Date of Birth: 1936-02-27           Patient Gender: F Patient Age:   52 years Exam Location:  [facility] Procedure:      VAS [place] LOWER EXTREMITY VENOUS (DVT) Referring Phys: Loree Fee HARRIS --------------------------------------------------------------------------------  Indications: Edema.  Comparison Study: 03/11/2021 bilateral lower extremity venous duplex- age                   indeterminate DVT right popliteal vein. Performing Technologist: Maudry Mayhew MHA, RDMS, RVT, RDCS  Examination Guidelines: A complete evaluation includes B-mode imaging, spectral Doppler, color Doppler, and power Doppler as needed of all accessible portions of each vessel. Bilateral testing is considered an integral part of a complete examination. Limited examinations for reoccurring indications may be performed as noted. The reflux portion of the exam is performed with the patient in reverse Trendelenburg.  +---------+---------------+---------+-----------+----------+-----------------+  RIGHT      Compressibility Phasicity Spontaneity Properties Thrombus Aging     +---------+---------------+---------+-----------+----------+-----------------+  CFV       Full            Yes       Yes                                       +---------+---------------+---------+-----------+----------+-----------------+  SFJ       Full                                                                +---------+---------------+---------+-----------+----------+-----------------+  FV Prox   Full                                                                +---------+---------------+---------+-----------+----------+-----------------+  FV Mid    Full                                                                +---------+---------------+---------+-----------+----------+-----------------+  FV Distal Full                                                                +---------+---------------+---------+-----------+----------+-----------------+  PFV       Full                                                                +---------+---------------+---------+-----------+----------+-----------------+  POP       Partial         Yes       Yes                    Age Indeterminate  +---------+---------------+---------+-----------+----------+-----------------+  PTV       Full                                                                +---------+---------------+---------+-----------+----------+-----------------+  PERO      Full                                                                +---------+---------------+---------+-----------+----------+-----------------+   +---------+---------------+---------+-----------+----------+--------------+  LEFT      Compressibility Phasicity Spontaneity Properties Thrombus Aging  +---------+---------------+---------+-----------+----------+--------------+  CFV       Full            Yes       Yes                                     +---------+---------------+---------+-----------+----------+--------------+  SFJ       Full                                                             +---------+---------------+---------+-----------+----------+--------------+  FV Prox   Full                                                             +---------+---------------+---------+-----------+----------+--------------+  FV Mid    Full                                                             +---------+---------------+---------+-----------+----------+--------------+  FV Distal Full                                                             +---------+---------------+---------+-----------+----------+--------------+  PFV       Full                                                             +---------+---------------+---------+-----------+----------+--------------+  POP       Full            Yes       Yes                                    +---------+---------------+---------+-----------+----------+--------------+  PTV       Full                                                             +---------+---------------+---------+-----------+----------+--------------+  PERO      Full                                                             +---------+---------------+---------+-----------+----------+--------------+     Summary: RIGHT: - Findings consistent with age indeterminate deep vein thrombosis involving the right popliteal vein. - No cystic structure found in the popliteal fossa.  LEFT: - Findings appear essentially unchanged compared to previous examination. - There is no evidence of deep vein thrombosis in the lower extremity.  - No cystic structure found in the popliteal fossa.  *See table(s) above for measurements and observations. Electronically signed by Servando Snare MD on 05/31/2021 at 2:34:50 PM.    Final    ECHOCARDIOGRAM LIMITED  Result Date: 05/31/2021    ECHOCARDIOGRAM LIMITED REPORT   Patient Name:   STEPHANIEMARIE STOFFEL Date of Exam: 05/31/2021  Medical Rec #:  [id number]         Height:       58.0 in Accession #:    [id number]        Weight:       88.2 lb Date of Birth:  1935/11/14          BSA:          1.286 m Patient Age:    22 years  BP:           128/72 mmHg Patient Gender: F                 HR:           65 bpm. Exam Location:  Inpatient Procedure: Limited Echo, Limited Color Doppler and Cardiac Doppler Indications:    dyspnea  History:        Patient has prior history of Echocardiogram examinations, most                 recent 03/12/2021. Raynaud's. Scleroderma. Chronic kidney                 disease., Signs/Symptoms:Dyspnea; Risk Factors:Hypertension.  Sonographer:    Johny Chess RDCS Referring Phys: [place]  1. Left ventricular ejection fraction, by estimation, is 60 to 65%. The left ventricle has normal function. The left ventricle has no regional wall motion abnormalities. Left ventricular diastolic parameters are consistent with Grade II diastolic dysfunction (pseudonormalization). Elevated left atrial pressure. There is the interventricular septum is flattened in systole, consistent with right ventricular pressure overload.  2. Right ventricular systolic function is normal. There is severely elevated pulmonary artery systolic pressure. The estimated right ventricular systolic pressure is 24.0 mmHg.  3. The mitral valve is normal in structure. No evidence of mitral valve regurgitation. No evidence of mitral stenosis.  4. Tricuspid valve regurgitation is moderate to severe.  5. The aortic valve is normal in structure. Aortic valve regurgitation is mild. No aortic stenosis is present.  6. The inferior vena cava is normal in size with greater than 50% respiratory variability, suggesting right atrial pressure of 3 mmHg. FINDINGS  Left Ventricle: Left ventricular ejection fraction, by estimation, is 60 to 65%. The left ventricle has normal function. The left ventricle has no regional wall motion abnormalities.  The left ventricular internal cavity size was normal in size. There is  no left ventricular hypertrophy. The interventricular septum is flattened in systole, consistent with right ventricular pressure overload. Left ventricular diastolic parameters are consistent with Grade II diastolic dysfunction (pseudonormalization). Elevated left atrial pressure. Right Ventricle: No increase in right ventricular wall thickness. Right ventricular systolic function is normal. There is severely elevated pulmonary artery systolic pressure. The tricuspid regurgitant velocity is 4.01 m/s, and with an assumed right atrial pressure of 3 mmHg, the estimated right ventricular systolic pressure is 97.3 mmHg. Left Atrium: Left atrial size was normal in size. Right Atrium: Right atrial size was normal in size. Pericardium: There is no evidence of pericardial effusion. Mitral Valve: The mitral valve is normal in structure. There is mild thickening of the mitral valve leaflet(s). No evidence of mitral valve stenosis. Tricuspid Valve: The tricuspid valve is normal in structure. Tricuspid valve regurgitation is moderate to severe. No evidence of tricuspid stenosis. Aortic Valve: The aortic valve is normal in structure. Aortic valve regurgitation is mild. Aortic regurgitation PHT measures 503 msec. No aortic stenosis is present. Pulmonic Valve: The pulmonic valve was normal in structure. Pulmonic valve regurgitation is trivial. No evidence of pulmonic stenosis. Aorta: The aortic root is normal in size and structure. Venous: The inferior vena cava is normal in size with greater than 50% respiratory variability, suggesting right atrial pressure of 3 mmHg. IAS/Shunts: No atrial level shunt detected by color flow Doppler. LEFT VENTRICLE PLAX 2D LVIDd:         4.06 cm Diastology LVIDs:         2.18 cm  LV e' medial:    5.55 cm/s LV PW:         1.10 cm LV E/e' medial:  15.2 LV IVS:        0.75 cm LV e' lateral:   5.33 cm/s                        LV  E/e' lateral: 15.9  LEFT ATRIUM         Index LA diam:    3.20 cm 2.49 cm/m  AORTIC VALVE AI PHT:      503 msec  AORTA Ao Asc diam: 3.10 cm MITRAL VALVE               TRICUSPID VALVE MV Area (PHT): 3.53 cm    TR Peak grad:   64.3 mmHg MV Decel Time: 215 msec    TR Vmax:        401.00 cm/s MV E velocity: 84.50 cm/s MV A velocity: 85.10 cm/s MV E/A ratio:  0.99 Mihai Croitoru MD Electronically signed by Sanda Klein MD Signature Date/Time: 05/31/2021/1:23:45 PM    Final

## 2021-06-03 NOTE — Progress Notes (Signed)
Patient ID: Meghan Wise, female   DOB: 08-11-1935, 86 y.o.   MRN: [id number]     Advanced Heart Failure Rounding Note  PCP-Cardiologist: None   Subjective:    Recurrent AF/RVR yesterday, amiodarone gtt restarted.  She is in NSR today.  Hgb 10.1 => 9.3, no overt bleeding. Creatinine 1.96 => 1.81 (diuretic held yesterday).   She is on her baseline 4L home oxygen.   RHC Procedural Findings: Hemodynamics (mmHg) RA mean 11 RV 57/13 PA 57/16, mean 33 PCWP mean 10 Oxygen saturations: PA 60% AO 96% Cardiac Output (Fick) 4.79  Cardiac Index (Fick) 3.72 PVR 4.8 WU   Objective:   Weight Range: 40 kg Body mass index is 18.43 kg/m.   Vital Signs:   Temp:  [97.6 F (36.4 C)-98.7 F (37.1 C)] 97.6 F (36.4 C) (02/10 0811) Pulse Rate:  [60-136] 68 (02/10 0811) Resp:  [14-31] 20 (02/10 0811) BP: (108-143)/(50-79) 143/78 (02/10 0811) SpO2:  [93 %-100 %] 93 % (02/10 0811) Last BM Date: 06/02/21  Weight change: Filed Weights   05/30/21 1908  Weight: 40 kg    Intake/Output:   Intake/Output Summary (Last 24 hours) at 06/03/2021 0837 Last data filed at 06/02/2021 1100 Gross per 24 hour  Intake 3 ml  Output --  Net 3 ml      Physical Exam    General: Frail.  Neck: JVP 8-9 cm, no thyromegaly or thyroid nodule.  Lungs: Mild crackles at bases.  CV: Nondisplaced PMI.  Heart regular S1/S2, no S3/S4, no murmur.  No peripheral edema.   Abdomen: Soft, nontender, no hepatosplenomegaly, no distention.  Skin: Intact without lesions or rashes.  Neurologic: Alert and oriented x 3.  Psych: Normal affect. Extremities: No clubbing or cyanosis.  HEENT: Normal.    Telemetry   NSR 60s, personally reviewed  Labs    CBC Recent Labs    06/02/21 0445 06/03/21 0058  WBC 8.2 6.9  NEUTROABS 6.4 5.5  HGB 10.1* 9.3*  HCT 30.8* 29.4*  MCV 86.0 87.5  PLT 356 633   Basic Metabolic Panel Recent Labs    06/02/21 0445 06/03/21 0058  NA 140 140  K 3.4* 4.2  CL 105 108  CO2  21* 20*  GLUCOSE 91 95  BUN 52* 45*  CREATININE 1.96* 1.81*  CALCIUM 7.1* 7.0*  MG 2.3 2.1   Liver Function Tests Recent Labs    06/02/21 0445 06/03/21 0058  AST 109* 58*  ALT 155* 112*  ALKPHOS 176* 175*  BILITOT 0.5 0.5  PROT 5.7* 5.6*  ALBUMIN 2.4* 2.3*   No results for input(s): LIPASE, AMYLASE in the last 72 hours. Cardiac Enzymes No results for input(s): CKTOTAL, CKMB, CKMBINDEX, TROPONINI in the last 72 hours.  BNP: BNP (last 3 results) Recent Labs    06/01/21 0133 06/02/21 0445 06/03/21 0058  BNP 1,227.1* 1,361.6* 1,405.0*    ProBNP (last 3 results) No results for input(s): PROBNP in the last 8760 hours.   D-Dimer No results for input(s): DDIMER in the last 72 hours. Hemoglobin A1C No results for input(s): HGBA1C in the last 72 hours. Fasting Lipid Panel No results for input(s): CHOL, HDL, LDLCALC, TRIG, CHOLHDL, LDLDIRECT in the last 72 hours. Thyroid Function Tests Recent Labs    06/01/21 0622  TSH 3.596    Other results:   Imaging    IR IVC FILTER PLMT / S&I Burke Keels GUID/MOD SED  Result Date: 06/02/2021 INDICATION: History of scleroderma with associated interstitial lung disease. Persistent DVT despite anticoagulation.  Additionally, patient has experienced lower GI bleeding as a sequela of anticoagulation As such, patient presents today for IVC filter placement for caval interruption purposes. Given patient's advanced age and multiple medical comorbidities, the IVC filter will be considered a permanent device and she will NOT be actively followed by the interventional radiology service for retrieval. EXAM: ULTRASOUND GUIDANCE FOR VASCULAR ACCESS IVC CATHETERIZATION AND VENOGRAM IVC FILTER INSERTION COMPARISON:  CT abdomen and pelvis-05/25/2012 MEDICATIONS: None. ANESTHESIA/SEDATION: Moderate (conscious) sedation was employed during this procedure as administered by the Interventional Radiology RN. A total of Versed 0.5 mg was administered  intravenously. Moderate Sedation Time: 10 minutes. The patient's level of consciousness and vital signs were monitored continuously by radiology nursing throughout the procedure under my direct supervision. CONTRAST:  None, CO2 was utilized for the examination given patient's chronic renal insufficiency. FLUOROSCOPY TIME:  1 minute, 6 seconds (7 mGy) COMPLICATIONS: None immediate PROCEDURE: Informed consent was obtained from the patient following explanation of the procedure, risks, benefits and alternatives. The patient understands, agrees and consents for the procedure. All questions were addressed. A time out was performed prior to the initiation of the procedure. Maximal barrier sterile technique utilized including caps, mask, sterile gowns, sterile gloves, large sterile drape, hand hygiene, and Betadine prep. Under sterile condition and local anesthesia, right internal jugular venous access was performed with ultrasound. An ultrasound image was saved and sent to PACS. Over a guidewire, the IVC filter delivery sheath and inner dilator were advanced into the IVC just above the IVC bifurcation. CO2 injection was performed for an IVC venogram. Through the delivery sheath, a retrievable Denali IVC filter was deployed below the level of the renal veins and above the IVC bifurcation. Limited post deployment venacavagram was performed. The delivery sheath was removed and hemostasis was obtained with manual compression. A dressing was applied. The patient tolerated the procedure well without immediate post procedural complication. FINDINGS: The IVC is patent. No evidence of thrombus, stenosis, or occlusion. No variant venous anatomy. Successful placement of the IVC filter below the level of the renal veins. IMPRESSION: Successful ultrasound and fluoroscopically guided placement of an infrarenal retrievable IVC filter via right jugular approach. PLAN: Due to patient related comorbidities and/or clinical necessity, this  IVC filter should be considered a permanent device. This patient will not be actively followed for future filter retrieval. Electronically Signed   By: Sandi Mariscal M.D.   On: 06/02/2021 10:24     Medications:     Scheduled Medications:  sodium chloride   Intravenous Once   ambrisentan  10 mg Oral Daily   amiodarone  200 mg Oral BID   atorvastatin  20 mg Oral Daily   ferrous sulfate  325 mg Oral Daily   folic acid  672 mcg Oral QPM   levothyroxine  50 mcg Oral Q0600   pantoprazole (PROTONIX) IV  40 mg Intravenous Q12H   predniSONE  5 mg Oral Q breakfast   sodium chloride flush  3 mL Intravenous Q12H   tadalafil  40 mg Oral QPM   vitamin B-12  1,000 mcg Oral Daily    Infusions:  sodium chloride      PRN Medications: sodium chloride, acetaminophen **OR** acetaminophen, ALPRAZolam, iohexol, loperamide, melatonin, morphine injection, sodium chloride flush, traMADol   Assessment/Plan   1. Pulmonary hypertension/RV failure: Moderate pulmonary arterial hypertension by RHC with PVR 4.8 WU.  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  She has been Opsumit and tadalafil at home, did not  tolerate Tyvaso.  Per Dr. Matilde Bash review of recent high resolution CT chest, no significant ILD was present.  Echo on my review showed EF 60-65% with D-shaped septum, moderate RV dilation, mild RV dysfunction.  RHC on 2/8 showed normal PCWP, mildly elevated RA pressure. Suspect mild RV failure by exam.  - Continue home Opsumit and tadalafil. - Consider Uptravi as outpatient.  - Restart home torsemide 10 mg daily with lower creatinine today.   2. Chronic hypoxemic respiratory failure: She is on 4-5 L home oxygen.  Per Dr. Matilde Bash review of high resolution CT chest, she does not have significant pulmonary fibrosis.  ?Hypoxemia due to pulmonary hypertension only. She is currently on her baseline 4L home oxygen.  3. Atrial fibrillation: Paroxysmal, she went into AF with RVR on the am of  2/8, started on amiodarone gtt and converted back to NSR. Amiodarone stopped, recurrent of AF/RVR 2/9 and now back in NSR on amiodarone gtt.  - Stop amiodarone gtt, start amiodarone 200 mg bid.  With recurrent AF, would continue for at least a month or so as outpatient.  Could consider more long-term as per pulmonary, she does not seem to have significant parenchymal lung disease.   - Unable to anticoagulate with active GI bleeding. Would aim to restart Eliquis in a week or so if no further bleeding.   - Mildly elevated LFTs, unrelated to amiodarone and trending down.   4. GI bleeding: Melena, h/o AVMs.  She has had blood this admission. Hgb 10.1 => 9.3  Deemed too high risk for endoscopy by GI.  - Eliquis on hold, would probably aim to hold a week or so and try to restart (indication for PE/DVT and atrial fibrillation).  5. PE: H/o DVT/PE, on Eliquis at home.  This admission, V/Q scan equivocal for PE. She had IVC filter placed 2/9.  - Off Eliquis with GI bleeding.  6. AKI on CKD Stage 3: Creatinine trending down.  - Restarting torsemide today.  7. Deconditioning: Out of bed, PT.   If no further overt bleeding, suspect she can go home over weekend.   Length of Stay: 3  Loralie Champagne, MD  06/03/2021, 8:37 AM  Advanced Heart Failure Team Pager [phone] (M-F; 7a - 5p)  Please contact [facility] for night-coverage after hours (5p -7a ) and weekends on [email or web address]

## 2021-06-03 NOTE — Progress Notes (Signed)
Physical Therapy Treatment Patient Details Name: Meghan Wise MRN: [id number] DOB: 07-11-1935 Today's Date: 06/03/2021   History of Present Illness Pt. is a 86 y.o. presentibg to Meghan Wise on 2/6 with chief complaints of worsening SOB over the past 3 weeks, anemia. On 2/8 pt. had episode of SOB and tachycardic, with EKG showing A-fib with RVR. s/p Meghan Wise placement 06/02/21. PMH significant for HTN, hypothyroidism, CKD stage IIIb, scleroderma, chronic diastolic CHF, pulmonary HTN, chronic respiratory failure on 4 L oxygen (5L for exertion), Admitted in November 2022 for dyspnea/chest pain with concern for PE.    PT Comments    Patient progressing well s/p Meghan Wise placement yesterday. Eager to mobilize and get OOB. Requires Min guard assist for all mobility and need for RW due to mild instability/balance, which pt is aware of. Noted to have 2-3/4 DOE with activity requiring 2 standing rest breaks. Encouraged continued walking this weekend and up to chair for all meals.  Will follow.   Recommendations for follow up therapy are one component of a multi-disciplinary discharge planning process, led by the attending physician.  Recommendations may be updated based on patient status, additional functional criteria and insurance authorization.  Follow Up Recommendations  Home health PT     Assistance Recommended at Discharge    Patient can return home with the following A little help with walking and/or transfers;A little help with bathing/dressing/bathroom;Assistance with cooking/housework;Assist for transportation   Equipment Recommendations  Rolling walker (2 wheels) (pending improvement)    Recommendations for Other Services       Precautions / Restrictions Precautions Precautions: Fall;Other (comment) Precaution Comments: watch 02 Restrictions Weight Bearing Restrictions: No     Mobility  Bed Mobility Overal bed mobility: Needs Assistance Bed Mobility: Supine to Sit, Sit to  Supine     Supine to sit: Supervision, HOB elevated Sit to supine: Supervision, HOB elevated   General bed mobility comments: Assist to manage lines but no physical assist.    Transfers Overall transfer level: Needs assistance Equipment used: Rolling walker (2 wheels), None Transfers: Sit to/from Stand Sit to Stand: Min assist, Min guard           General transfer comment: Min A to steady ins tanding on first attempt progressing to supervision to stand from toilet-, stood from EOB x1, from toilet x1.    Ambulation/Gait Ambulation/Gait assistance: Min guard Gait Distance (Feet): 150 Feet (+ 15' + 15') Assistive device: Rolling walker (2 wheels), None Gait Pattern/deviations: Step-through pattern, Decreased stride length, Drifts right/left Gait velocity: good speed Gait velocity interpretation: 1.31 - 2.62 ft/sec, indicative of limited community ambulator   General Gait Details: Slow, mildly unsteady gait with use of RW; 2 standing rest breaks with 2-3/4 DOE. VSS on 6L/min 02 [name].   Stairs             Wheelchair Mobility    Modified Rankin (Stroke Patients Only)       Balance Overall balance assessment: Needs assistance Sitting-balance support: Feet supported, No upper extremity supported Sitting balance-Leahy Scale: Good Sitting balance - Comments: Able to perform pericare without difficulty   Standing balance support: During functional activity Standing balance-Leahy Scale: Fair                              Cognition Arousal/Alertness: Awake/alert Behavior During Therapy: WFL for tasks assessed/performed Overall Cognitive Status: Within Functional Limits for tasks assessed  General Comments: Eager to move        Exercises      General Comments General comments (skin integrity, edema, etc.): VSS, IV site dry and covered. Meghan wound dressing at neck dry and covered 6L o2 [name]      Pertinent  Vitals/Pain Pain Assessment Pain Assessment: No/denies pain    Home Living                          Prior Function            PT Goals (current goals can now be found in the care plan section) Progress towards PT goals: Progressing toward goals    Frequency    Min 3X/week      PT Plan Current plan remains appropriate    Co-evaluation              AM-PAC PT "6 Clicks" Mobility   Outcome Measure  Help needed turning from your back to your side while in a flat bed without using bedrails?: None Help needed moving from lying on your back to sitting on the side of a flat bed without using bedrails?: A Little Help needed moving to and from a bed to a chair (including a wheelchair)?: A Little Help needed standing up from a chair using your arms (e.g., wheelchair or bedside chair)?: A Little Help needed to walk in hospital room?: A Little Help needed climbing 3-5 steps with a railing? : A Lot 6 Click Score: 18    End of Session Equipment Utilized During Treatment: Gait belt Activity Tolerance: Patient tolerated treatment well Patient left: in bed;with call bell/phone within reach;with bed alarm set Nurse Communication: Mobility status PT Visit Diagnosis: Muscle weakness (generalized) (M62.81);Other abnormalities of gait and mobility (R26.89);Difficulty in walking, not elsewhere classified (R26.2);Other (comment) (SOB)     Time: 1349-1410 PT Time Calculation (min) (ACUTE ONLY): 21 min  Charges:  $Gait Training: 8-22 mins                     Marisa Severin, PT, DPT Acute Rehabilitation Services Pager [phone] Office [phone]      [place] 06/03/2021, 3:17 PM

## 2021-06-03 NOTE — Plan of Care (Signed)

## 2021-06-03 NOTE — Progress Notes (Signed)
NAME:  Meghan Wise, MRN:  [id number], DOB:  February 22, 1936, LOS: 3 ADMISSION DATE:  05/30/2021, CONSULTATION DATE:  05/31/2021 REFERRING MD:  Dr. Candiss Norse, CHIEF COMPLAINT:  Dyspnea    History of Present Illness:  Meghan Wise is a 86 y.o. female with a extensive PMH that includes but isn't limited to; Severe pulmonary hypertension, pulmonary and renal scleroderma, prior PE and DVT anticoagulated on Eliquis, chronic hypoxic respiratory failure on 4L [place], CKD stage IV, HLD, HTN, and CHF who presented to the ED evening of 2/6 for concern of low hemoglobin, increased SOB, and increased weakness. She was recently treated with Z-Pak and prednisone taper per her primary. Patient was seen by Dr. Marshell Garfinkel 2/3 for possible establishment of care with the [facility] office. At that time recommendations were made to increased diuretics and follow back up with primary pulmonologist at [facility]  Patient was then seen by primary pulmonology at [facility] morning of 2/6 and he recommended admission at that time but patient wished to come to [facility] because"I live in the area". He primary pulmonologist recommended high resolution CT (as did ours) and possible left heart cath to better characterize cardiac function. At [facility] patient was also seen with worsened anemia (hgb dropped from 8-6.8) in the setting of anticoagulation and new melena per patient.   On ED arrival patient was seen hemodynamically stable with mild tachypnea. Labwork on admit significant for hgb 7.4, WBC 11.2, BNP 562, creatinine 1.86, GRF 31. FOBT was positive and GI was consulted, anticoagulation was stopped and blood transfusion was ordered per GI. She was admitted per Hospitilist and pulmonary was consulted for additional care   Pertinent  Medical History  Pulmonary hypertension, pulmonary and renal scleroderma, prior PE and DVT anticoagulated on Eliquis, chronic hypoxic respiratory failure on 4L [place], CKD stage IV, HLD, HTN, and CHF  Significant  Hospital Events: Including procedures, antibiotic start and stop dates in addition to other pertinent events   2/6 admitted for worsened anemia, malaise, and SOB 2/8 30m Lasix. Heart cath   2/9 back in afib RVR. Getting amio. Was feeling better before afib rvr recurred. Holding diuresis with Cr bump   Images  2/6 High resolution chest CT > Limited study demonstrating no definitive evidence to suggest interstitial lung disease. Mild air trapping, suggesting small airways disease. Cardiomegaly with evidence of interstitial pulmonary edema. Aortic atherosclerosis 2/8 RHC- moderate PA HTN with PVR 4.8. Normal PCWP. Mildly elevated RA, RVEDP. Preserved CO  2/9 IVC filter placed, Eliquis on hold for GIB.   Interim History / Subjective:  IVC filter placed yesterday. Back in AF post-procedure and amio was restarted.   No complaints. She feels as though she is back to her baseline pulmonary status.    Objective   Blood pressure (!) 163/69, pulse 60, temperature (!) 97.3 F (36.3 C), temperature source Oral, resp. rate 18, height _0  (1.473 m), weight 40 kg, SpO2 97 %.        Intake/Output Summary (Last 24 hours) at 06/03/2021 1213 Last data filed at 06/03/2021 0933 Gross per 24 hour  Intake 3 ml  Output --  Net 3 ml     Filed Weights   05/30/21 1908  Weight: 40 kg    Examination:  Gen:  frail elderly female in NAD HEENT:  [place]/AT, PERRL Lungs: Clear bilateral breath sounds. No distress. Sats in high 90s on 5L.  CV:  RRR Abd:  Soft, non-tender, non-distended Ext: No acute deformity.  Skin:  Grossly  intact.  Neuro: Alert, oriented, non-focal.   Resolved Hospital Problem list     Assessment & Plan:   Acute on chronic respiratory failure with hypoxia  Pulmonary Hypertension felt to be WHO group 1 secondary to scleroderma.  Dyspnea preceded by melena.  There is no evidence of interstitial lung disease on CT RHC with moderate PAH, normal PCWP, Mildly elevated RA and  RVEDP. RV failure -on 5L [place], which is her home flow rate.  -Home diuretic regimen starting today -cont home opsumit, tadalafil -consider outpt oral prostacyclin. Has not tolerated tyvaso previously  -IS, flutter, mobility -Outpatient follow up with Dr. Vaughan Browner  Atrial fibrillation with RVR: now in sinus.   - Eliquis on hold in the setting of GIB. Dr. Aundra Dubin recommending hold for one week.  - Amiodarone infusion - Per cardiology  GIB in setting of anticoagulation. History of AVM.  - per TRH  DVT likely PE She has equivocal VQ scan which may indicate a new PE.  Options are limited due to GI bleed -holding eliquis as above -S/p IVC filter placement 2/9  AKI on CKD 3 - Per primary   Elevated LFTs -trend on amio   Hypokalemia -replace, goal>4  Goals of Care DNR status Appreciate input from palliative care.   DNR  Best Practice (right click and "Reselect all SmartList Selections" daily)  Per primary   Critical care time: NA    Georgann Housekeeper, AGACNP-BC [place]  See Amion for personal pager PCCM on call pager [phone] until 7pm. Please call Elink 7p-7a. [phone]  06/03/2021 12:36 PM

## 2021-06-03 NOTE — Progress Notes (Signed)
Occupational Therapy Treatment Patient Details Name: Meghan Wise MRN: [id number] DOB: 12-21-1935 Today's Date: 06/03/2021   History of present illness Pt. is a 86 y.o. presentibg to [facility] on 2/6 with chief complaints of worsening SOB over the past 3 weeks, anemia. On 2/8 pt. had episode of SOB and tachycardic, with EKG showing A-fib with RVR, which she then received PRBC. PMH significant for hypertension, hyperlipidemia, hypothyroidism, CKD stage IIIb, scleroderma, chronic diastolic CHF, pulmonary hypertension, chronic respiratory failure on 4 L oxygen (5L for exertion), Admitted in November 2022 for dyspnea/chest pain with concern for PE.   OT comments  Dr [employer]ron [employer] approved shower transfer off telemetry at this session. Pt showering with [place] 8L and able to complete with hand held shower head and shower seat. Pt using energy conservation of sitting with door open for air circulation. Pt tolerates well. Recommendation for [facility]    Recommendations for follow up therapy are one component of a multi-disciplinary discharge planning process, led by the attending physician.  Recommendations may be updated based on patient status, additional functional criteria and insurance authorization.    Follow Up Recommendations  Home health OT    Assistance Recommended at Discharge Intermittent Supervision/Assistance  Patient can return home with the following  A little help with bathing/dressing/bathroom;A little help with walking and/or transfers;Assistance with cooking/housework;Assist for transportation   Equipment Recommendations  None recommended by OT    Recommendations for Other Services      Precautions / Restrictions Precautions Precautions: Fall Precaution Comments: IVC filter,       Mobility Bed Mobility               General bed mobility comments: oob in chair    Transfers Overall transfer level: Needs assistance Equipment used: 1 person hand held assist Transfers: Sit  to/from Stand Sit to Stand: Min assist           General transfer comment: hand held (A) due to transfer from chair to bathroom and bathroom back to chair     Balance Overall balance assessment: Needs assistance Sitting-balance support: Bilateral upper extremity supported, Feet supported Sitting balance-Leahy Scale: Fair     Standing balance support: Bilateral upper extremity supported, During functional activity Standing balance-Leahy Scale: Fair                             ADL either performed or assessed with clinical judgement   ADL Overall ADL's : Needs assistance/impaired Eating/Feeding: Independent   Grooming: Wash/dry face;Sitting;Set up   Upper Body Bathing: Set up;Sitting Upper Body Bathing Details (indicate cue type and reason): transfer into the actual shower. Dr Waldron [employer] approved shower transfer Lower Body Bathing: Set up;Sit to/from stand Lower Body Bathing Details (indicate cue type and reason): holding grab bar for peri care Upper Body Dressing : Set up   Lower Body Dressing: Set up Lower Body Dressing Details (indicate cue type and reason): crossing bil Le Toilet Transfer: Set up;BSC/3in1   Toileting- Clothing Manipulation and Hygiene: Set up       Functional mobility during ADLs: Minimal assistance      Extremity/Trunk Assessment Upper Extremity Assessment Upper Extremity Assessment: Overall WFL for tasks assessed   Lower Extremity Assessment Lower Extremity Assessment: Overall WFL for tasks assessed        Vision       Perception     Praxis      Cognition Arousal/Alertness: Awake/alert Behavior During Therapy: [facility] for tasks  assessed/performed Overall Cognitive Status: Within Functional Limits for tasks assessed                                          Exercises      Shoulder Instructions       General Comments VSS, IV site dry and covered. IVC wound dressing at neck dry and covered 6L o2 [place]     Pertinent Vitals/ Pain       Pain Assessment Pain Assessment: No/denies pain  Home Living                                          Prior Functioning/Environment              Frequency  Min 2X/week        Progress Toward Goals  OT Goals(current goals can now be found in the care plan section)  Progress towards OT goals: Progressing toward goals  Acute Rehab OT Goals Patient Stated Goal: to shower OT Goal Formulation: With patient Time For Goal Achievement: 06/15/21 Potential to Achieve Goals: Good ADL Goals Pt Will Perform Grooming: with modified independence Pt Will Perform Upper Body Bathing: with modified independence Pt Will Perform Lower Body Bathing: with modified independence Pt Will Perform Upper Body Dressing: with modified independence Pt Will Perform Lower Body Dressing: with modified independence Pt Will Transfer to Toilet: with modified independence Pt Will Perform Toileting - Clothing Manipulation and hygiene: with set-up Pt Will Perform Tub/Shower Transfer: with min guard assist;with supervision;ambulating;shower seat Additional ADL Goal #1: Pt will verbalize and demo 3 energy conservation techniques for ADLs/selcare and ADL mobility tasks  Plan Discharge plan remains appropriate    Co-evaluation                 AM-PAC OT "6 Clicks" Daily Activity     Outcome Measure   Help from another person eating meals?: None Help from another person taking care of personal grooming?: A Little Help from another person toileting, which includes using toliet, bedpan, or urinal?: A Little Help from another person bathing (including washing, rinsing, drying)?: A Little Help from another person to put on and taking off regular upper body clothing?: None Help from another person to put on and taking off regular lower body clothing?: A Little 6 Click Score: 20    End of Session Equipment Utilized During Treatment: Oxygen  OT Visit  Diagnosis: Unsteadiness on feet (R26.81);Muscle weakness (generalized) (M62.81)   Activity Tolerance Patient tolerated treatment well   Patient Left in chair;with call bell/phone within reach   Nurse Communication Mobility status        Time: 1015-1101 OT Time Calculation (min): 46 min  Charges: OT General Charges $OT Visit: 1 Visit OT Treatments $Self Care/Home Management : 38-52 mins   Brynn, OTR/L  Acute Rehabilitation Services Pager: [phone] Office: [phone] .   Jeri Modena 06/03/2021, 12:58 PM

## 2021-06-03 NOTE — Progress Notes (Signed)
Mobility Specialist Progress Note    06/03/21 0958  Mobility  Bed Position Chair  Activity Ambulated with assistance in hallway  Level of Assistance Standby assist, set-up cues, supervision of patient - no hands on  Assistive Device Front wheel walker  Distance Ambulated (ft) 260 ft  Activity Response Tolerated fair  $Mobility charge 1 Mobility   During Mobility: 78 HR Post-Mobility: 66 HR, 169/47 BP, 99% SpO2  Pt received in bed and agreeable. Took x3 short standing rest breaks to catch breath. Ambulated on 6LO2 (d/t no 5L setting on tank). Returned to chair with call bell in reach.   [facility] Mobility Specialist  M.S. 5N: [phone]

## 2021-06-03 NOTE — Progress Notes (Signed)
HR 49-50 SR, bp 122/63.  Pt remains on on Amio gtt at 30 mg/hr.  Called Dr. [name], notified of HR.  No orders given.  Will continue Amio.

## 2021-06-04 DIAGNOSIS — R0600 Dyspnea, unspecified: Secondary | ICD-10-CM | POA: Diagnosis not present

## 2021-06-04 LAB — COMPREHENSIVE METABOLIC PANEL
ALT: 84 U/L — ABNORMAL HIGH (ref 0–44)
AST: 34 U/L (ref 15–41)
Albumin: 2.4 g/dL — ABNORMAL LOW (ref 3.5–5.0)
Alkaline Phosphatase: 161 U/L — ABNORMAL HIGH (ref 38–126)
Anion gap: 11 (ref 5–15)
BUN: 42 mg/dL — ABNORMAL HIGH (ref 8–23)
CO2: 22 mmol/L (ref 22–32)
Calcium: 7 mg/dL — ABNORMAL LOW (ref 8.9–10.3)
Chloride: 110 mmol/L (ref 98–111)
Creatinine, Ser: 1.9 mg/dL — ABNORMAL HIGH (ref 0.44–1.00)
GFR, Estimated: 26 mL/min — ABNORMAL LOW (ref >60)
Glucose, Bld: 95 mg/dL (ref 70–99)
Potassium: 3.6 mmol/L (ref 3.5–5.1)
Sodium: 143 mmol/L (ref 135–145)
Total Bilirubin: 0.6 mg/dL (ref 0.3–1.2)
Total Protein: 5.5 g/dL — ABNORMAL LOW (ref 6.5–8.1)

## 2021-06-04 LAB — CBC WITH DIFFERENTIAL/PLATELET
Abs Immature Granulocytes: 0.03 10*3/uL (ref 0.00–0.07)
Basophils Absolute: 0 10*3/uL (ref 0.0–0.1)
Basophils Relative: 0 %
Eosinophils Absolute: 0.2 10*3/uL (ref 0.0–0.5)
Eosinophils Relative: 3 %
HCT: 30.2 % — ABNORMAL LOW (ref 36.0–46.0)
Hemoglobin: 9.8 g/dL — ABNORMAL LOW (ref 12.0–15.0)
Immature Granulocytes: 1 %
Lymphocytes Relative: 8 %
Lymphs Abs: 0.5 10*3/uL — ABNORMAL LOW (ref 0.7–4.0)
MCH: 28.5 pg (ref 26.0–34.0)
MCHC: 32.5 g/dL (ref 30.0–36.0)
MCV: 87.8 fL (ref 80.0–100.0)
Monocytes Absolute: 0.6 10*3/uL (ref 0.1–1.0)
Monocytes Relative: 9 %
Neutro Abs: 5.2 10*3/uL (ref 1.7–7.7)
Neutrophils Relative %: 79 %
Platelets: 308 10*3/uL (ref 150–400)
RBC: 3.44 MIL/uL — ABNORMAL LOW (ref 3.87–5.11)
RDW: 21.2 % — ABNORMAL HIGH (ref 11.5–15.5)
WBC: 6.6 10*3/uL (ref 4.0–10.5)
nRBC: 0 % (ref 0.0–0.2)

## 2021-06-04 LAB — BRAIN NATRIURETIC PEPTIDE: B Natriuretic Peptide: 1701.1 pg/mL — ABNORMAL HIGH (ref 0.0–100.0)

## 2021-06-04 LAB — MAGNESIUM: Magnesium: 2.1 mg/dL (ref 1.7–2.4)

## 2021-06-04 MED ORDER — PANTOPRAZOLE SODIUM 40 MG PO TBEC
40.0000 mg | DELAYED_RELEASE_TABLET | Freq: Two times a day (BID) | ORAL | Status: DC
  Administered 2021-06-04 – 2021-06-05 (×3): 40 mg via ORAL
  Filled 2021-06-04 (×3): qty 1

## 2021-06-04 MED ORDER — PANTOPRAZOLE SODIUM 40 MG PO TBEC: 40.0000 mg | DELAYED_RELEASE_TABLET | Freq: Two times a day (BID) | ORAL | Status: DC

## 2021-06-04 NOTE — Care Management Note (Signed)
Case Management Note  Patient Details  Name: Meghan Wise MRN: [id number] Date of Birth: 1935/09/04  Subjective/Objective:  86 yo admitted with dyspnea and chest pain with concern for PE.  Action/Plan: Received referral to assist with HHPT, OT, RN. PT is recommending a RW. Met with pt and daughter. She reports that she resides at [place]. She reports that she has a RW. She uses O2 at home and [employer] provides her O2 supplies. She reports that [place] will take care of the [facility] referral. Informed pt that I will contact [facility] to assist with the [facility] referral. Sent message to Philippa Sicks regarding [facility] referral. Awaiting for Rollene Fare to respond. Will continue to f/u to assist with the D/C plan.   Expected Discharge Date:                  Expected Discharge Plan:  [place]  In-House Referral:     Discharge planning Services  CM Consult  Post Acute Care Choice:  Home Health Choice offered to:  Patient, Adult Children  DME Arranged:    DME Agency:     HH Arranged:  RN, PT, OT [place] Agency:  Other - See comment  Status of Service:     If discussed at [place] of Stay Meetings, dates discussed:    Additional Comments:  Norina Buzzard, RN 06/04/2021, 3:21 PM

## 2021-06-04 NOTE — Progress Notes (Signed)
Mobility Specialist Progress Note:   06/04/21 1045  Mobility  Activity Ambulated with assistance in hallway  Level of Assistance Standby assist, set-up cues, supervision of patient - no hands on  Assistive Device Front wheel walker  Distance Ambulated (ft) 250 ft  Activity Response Tolerated well  $Mobility charge 1 Mobility   Session completed on 6LO2. Pt not requiring standing breaks during session. Displayed SOB, unable to get reliable pleth for SpO2 reading. Distance limited secondary to fatigue. Pt left sitting up in chair with all needs met.   Nelta Numbers Mobility Specialist  Phone: [phone]

## 2021-06-04 NOTE — Plan of Care (Signed)

## 2021-06-04 NOTE — Progress Notes (Signed)
PROGRESS NOTE                                                                                                                                                                                                             Patient Demographics:    Meghan Wise, is a 86 y.o. female, DOB - 1935-05-31, VLJ:[id number]  Outpatient Primary MD for the patient is Collene Leyden, MD    LOS - 4  Admit date - 05/30/2021    Chief Complaint  Patient presents with   Abnormal Lab       Brief Narrative (HPI from H&P)    86 y.o. female with medical history significant of hypertension, hyperlipidemia, hypothyroidism, CKD stage IIIb, scleroderma, chronic diastolic CHF, pulmonary hypertension, chronic respiratory failure on 4 L oxygen, simply diagnosed with DVT and possible PE and was placed on Eliquis.  She claims that a few days ago she had dark-colored stool which was almost black in color, subsequently went to see a pulmonologist for her underlying pulmonary hypertension where she had blood work and was found to have hemoglobin of 6.8 and then asked to come to the ER at [facility] for further evaluation of GI bleed and acute anemia.  As well she was noted to have A-fib with RVR on 06/01/2021.  Where she did convert to NSR without any intervention, she had a right heart cath on 2/8   Subjective:    Meghan Wise today denies any chest pain, reports dyspnea at baseline, reports she worked well with physical therapy yesterday.   -Remains in normal sinus rhythm.     Assessment  & Plan :    Acute on chronic hypoxic respiratory failure -Known underlying pulmonary hypertension, chronic respiratory failure on 5 L nasal cannula -Has worsened due to anemia in the setting of acute GI bleed.  -PCCM . cardiology input greatly appreciated. -She is currently back at her baseline of 4 L nasal cannula.  -No evidence of ILD on CT chest -Fritter status  currently at baseline  Acute GI blood loss related anemia.   -She was recently placed on Eliquis and had melanotic stools few days ago, she is s/p 1 packed RBC transfusion last night with stabilization of H&H, Eliquis held, IV PPI continue.  Case discussed with Dr. Benson Norway.  No invasive procedure right now due to her frail  status.  Supportive care and hold Eliquis as long as we can for the next 7 days. -Received 1 unit PRBC 2/8, hemoglobin stable posttransfusion, continue to monitor daily.  10.1>>9.3>>9.8 overnight. -No signs of GI bleed currently, likely can resume her Eliquis coming Friday if CBC remains stable.  History of DVT in November 2022.  With current DVT, likely PE - Eliquis held due GI bleed . -She has equivocal VQ scan which may indicate a new PE, or extremity Dopplers showing right popliteal vein DVT. -At this point cannot do anticoagulation due to GI bleed, s/p IVC filter 2/9  Advanced pulmonary hypertension.   - Between 3 to 5 L of nasal cannula oxygen at home.  -Right heart cath 2/8, management.  PCCM and advanced CHF team . - She has been Opsumit and tadalafil at home, did not tolerate Tyvaso.  A-fib with RVR - no anticoagulation due to GI bleed. -Currently she maintained normal sinus rhythm, continue with amiodarone 200 mg twice daily x5 days then once daily per cardiology recommendation.  Transaminitis -Sudden elevation overnight, most likely due to low blood pressure, trending down.  History of chronic diastolic CHF.  Last known EF in November 2022 is 65%.  Cardiology has been consulted, currently appears euvolemic, poor candidate for left or right heart cath due to multiple comorbidities and extremely frail status.  Continue home dose Demadex for now. -Appears to be well compensated on right heart cath, diuresis per cardiology.  On home dose torsemide as her creatinine has been improving.  Dyslipidemia.  On statin.  Severe protein calorie malnutrition due to underlying  pulmonary hypertension, scleroderma and advanced age.  Supportive care.  She is DNR and will involve palliative care also for goals of care.  Hypertension.  On Norvasc.  Hypothyroidism.  On Synthroid.  History of scleroderma.  Supportive care.  AKI on CKD stage IIIb.  Baseline creatinine around 1.6.  Follows with Dr. Marval Regal.  Thinning up to 1.96 today, continue to monitor and hold diuresis.   Ongoing left shoulder pain.   -X-ray with no acute findings  Hypokalemia -Replete, target potassium > 4 due to A-fib      Condition - Extremely Guarded  Family Communication  :  D/W patient, and D/W daughter at bedside 2/10, none at bedside today  Code Status :  DNR  Consults  :  Cards, PCCM, GI, Pall Care  PUD Prophylaxis : PPI   Procedures  :     TTE  Leg [place]  VQ  CT High Res - 1. Limited study demonstrating no definitive evidence to suggest interstitial lung disease. 2. There is cardiomegaly with evidence of interstitial pulmonary edema; imaging findings suggestive of congestive heart failure, as above. 3. Aortic atherosclerosis, in addition to left main and three-vessel coronary artery disease. 4. There are calcifications of the aortic valve. Echocardiographic correlation for evaluation of potential valvular dysfunction may be warranted if clinically indicated. 5. Mild air trapping, suggesting small airways disease. Aortic Atherosclerosis      Disposition Plan  :    Status is: Inpatient Remains inpatient appropriate because: GI Bleed  DVT Prophylaxis  :    Lab Results  Component Value Date   PLT 308 06/04/2021    Diet :  Diet Order             Diet regular Room service appropriate? Yes; Fluid consistency: Thin  Diet effective now  Inpatient Medications  Scheduled Meds:  sodium chloride   Intravenous Once   ambrisentan  10 mg Oral Daily   amiodarone  200 mg Oral BID   atorvastatin  20 mg Oral Daily   ferrous sulfate  325 mg Oral  Daily   folic acid  737 mcg Oral QPM   levothyroxine  50 mcg Oral Q0600   pantoprazole (PROTONIX) IV  40 mg Intravenous Q12H   predniSONE  5 mg Oral Q breakfast   sodium chloride flush  3 mL Intravenous Q12H   tadalafil  40 mg Oral QPM   torsemide  10 mg Oral Daily   vitamin B-12  1,000 mcg Oral Daily   Continuous Infusions:  sodium chloride     PRN Meds:.sodium chloride, acetaminophen **OR** acetaminophen, ALPRAZolam, iohexol, loperamide, melatonin, morphine injection, sodium chloride flush, traMADol  Antibiotics  :    Anti-infectives (From admission, onward)    None         Phillips Climes M.D on 06/04/2021 at 10:16 AM  To page go to [email or web address]   [facility] -  Office  [phone]  See all Orders from today for further details    Objective:   Vitals:   06/03/21 2027 06/03/21 2356 06/04/21 0334 06/04/21 0719  BP:  (!) 136/48 (!) 132/96 113/89  Pulse:  61 62 70  Resp:  _0 Temp: 98.8 F (37.1 C) 98.5 F (36.9 C) 98.7 F (37.1 C) 98.7 F (37.1 C)  TempSrc: Oral Oral Oral Oral  SpO2:  96% 92% 100%  Weight:      Height:        Wt Readings from Last 3 Encounters:  05/30/21 40 kg  05/27/21 40.6 kg  03/23/21 38.7 kg     Intake/Output Summary (Last 24 hours) at 06/04/2021 1016 Last data filed at 06/04/2021 0800 Gross per 24 hour  Intake 400 ml  Output --  Net 400 ml     Physical Exam  Awake Alert, Oriented X 3, frail Symmetrical Chest wall movement, Good air movement bilaterally, CTAB RRR,No Gallops,Rubs or new Murmurs, No Parasternal Heave +ve B.Sounds, Abd Soft, No tenderness, No rebound - guarding or rigidity. No Cyanosis, Clubbing or edema, No new Rash or bruise          Data Review:    CBC Recent Labs  Lab 05/30/21 1931 05/30/21 2213 05/31/21 1937 06/01/21 0133 06/01/21 1415 06/01/21 1418 06/02/21 0445 06/03/21 0058 06/04/21 0101  WBC 11.2*   < > 12.5* 9.9  --   --  8.2 6.9 6.6  HGB 7.4*   < > 8.1* 7.3*  10.2* 10.2* 10.1* 9.3* 9.8*  HCT 23.8*   < > 26.1* 23.1* 30.0* 30.0* 30.8* 29.4* 30.2*  PLT 445*   < > 382 339  --   --  356 309 308  MCV 96.7   < > 94.9 94.7  --   --  86.0 87.5 87.8  MCH 30.1   < > 29.5 29.9  --   --  28.2 27.7 28.5  MCHC 31.1   < > 31.0 31.6  --   --  32.8 31.6 32.5  RDW 14.6   < > 14.5 14.6  --   --  21.9* 21.7* 21.2*  LYMPHSABS 0.5*  --   --  0.6*  --   --  0.6* 0.6* 0.5*  MONOABS 0.8  --   --  1.1*  --   --  0.9 0.7 0.6  EOSABS 0.1  --   --  0.1  --   --  0.3 0.2 0.2  BASOSABS 0.0  --   --  0.0  --   --  0.0 0.0 0.0   < > = values in this interval not displayed.    Electrolytes Recent Labs  Lab 05/30/21 1931 05/30/21 2213 05/31/21 0257 06/01/21 0133 06/01/21 0622 06/01/21 1130 06/01/21 1415 06/01/21 1418 06/02/21 0445 06/03/21 0058 06/04/21 0101  NA 143   < > 145 137  --   --  140 140 140 140 143  K 3.9   < > 4.1 4.0  --   --  3.7 3.7 3.4* 4.2 3.6  CL 107   < > 110 103  --   --   --   --  105 108 110  CO2 23  --  24 22  --   --   --   --  21* 20* 22  GLUCOSE 184*   < > 90 97  --   --   --   --  91 95 95  BUN 48*   < > 46* 41*  --   --   --   --  52* 45* 42*  CREATININE 1.65*   < > 1.52* 1.69*  --   --   --   --  1.96* 1.81* 1.90*  CALCIUM 8.1*  --  8.0* 7.3*  --   --   --   --  7.1* 7.0* 7.0*  AST 28  --   --  359*  --   --   --   --  109* 58* 34  ALT 26  --   --  242*  --   --   --   --  155* 112* 84*  ALKPHOS 62  --   --  210*  --   --   --   --  176* 175* 161*  BILITOT 0.3  --   --  0.8  --   --   --   --  0.5 0.5 0.6  ALBUMIN 3.0*  --   --  2.5*  --   --   --   --  2.4* 2.3* 2.4*  MG  --   --  2.4 2.3  --   --   --   --  2.3 2.1 2.1  LATICACIDVEN  --   --   --   --  1.7 1.6  --   --   --   --   --   TSH  --   --   --   --  3.596  --   --   --   --   --   --   BNP 562.0*  --  785.4* 1,227.1*  --   --   --   --  1,361.6* 1,405.0* 1,701.1*   < > = values in this interval not displayed.     ------------------------------------------------------------------------------------------------------------------ No results for input(s): CHOL, HDL, LDLCALC, TRIG, CHOLHDL, LDLDIRECT in the last 72 hours.  No results found for: HGBA1C  No results for input(s): TSH, T4TOTAL, T3FREE, THYROIDAB in the last 72 hours.  Invalid input(s): FREET3  ------------------------------------------------------------------------------------------------------------------ ID Labs Recent Labs  Lab 05/31/21 0257 05/31/21 0920 05/31/21 1937 06/01/21 0133 06/01/21 0622 06/01/21 1130 06/02/21 0445 06/03/21 0058 06/04/21 0101  WBC 10.5   < > 12.5* 9.9  --   --  8.2 6.9 6.6  PLT 412*   < > 382 339  --   --  356 309 308  LATICACIDVEN  --   --   --   --  1.7 1.6  --   --   --   CREATININE 1.52*  --   --  1.69*  --   --  1.96* 1.81* 1.90*   < > = values in this interval not displayed.   Cardiac Enzymes No results for input(s): CKMB, TROPONINI, MYOGLOBIN in the last 168 hours.  Invalid input(s): CK   Radiology Reports IR IVC FILTER PLMT / S&I /IMG GUID/MOD SED  Result Date: 06/02/2021 INDICATION: History of scleroderma with associated interstitial lung disease. Persistent DVT despite anticoagulation. Additionally, patient has experienced lower GI bleeding as a sequela of anticoagulation As such, patient presents today for IVC filter placement for caval interruption purposes. Given patient's advanced age and multiple medical comorbidities, the IVC filter will be considered a permanent device and she will NOT be actively followed by the interventional radiology service for retrieval. EXAM: ULTRASOUND GUIDANCE FOR VASCULAR ACCESS IVC CATHETERIZATION AND VENOGRAM IVC FILTER INSERTION COMPARISON:  CT abdomen and pelvis-05/25/2012 MEDICATIONS: None. ANESTHESIA/SEDATION: Moderate (conscious) sedation was employed during this procedure as administered by the Interventional Radiology RN. A total of Versed 0.5 mg  was administered intravenously. Moderate Sedation Time: 10 minutes. The patient's level of consciousness and vital signs were monitored continuously by radiology nursing throughout the procedure under my direct supervision. CONTRAST:  None, CO2 was utilized for the examination given patient's chronic renal insufficiency. FLUOROSCOPY TIME:  1 minute, 6 seconds (7 mGy) COMPLICATIONS: None immediate PROCEDURE: Informed consent was obtained from the patient following explanation of the procedure, risks, benefits and alternatives. The patient understands, agrees and consents for the procedure. All questions were addressed. A time out was performed prior to the initiation of the procedure. Maximal barrier sterile technique utilized including caps, mask, sterile gowns, sterile gloves, large sterile drape, hand hygiene, and Betadine prep. Under sterile condition and local anesthesia, right internal jugular venous access was performed with ultrasound. An ultrasound image was saved and sent to PACS. Over a guidewire, the IVC filter delivery sheath and inner dilator were advanced into the IVC just above the IVC bifurcation. CO2 injection was performed for an IVC venogram. Through the delivery sheath, a retrievable Denali IVC filter was deployed below the level of the renal veins and above the IVC bifurcation. Limited post deployment venacavagram was performed. The delivery sheath was removed and hemostasis was obtained with manual compression. A dressing was applied. The patient tolerated the procedure well without immediate post procedural complication. FINDINGS: The IVC is patent. No evidence of thrombus, stenosis, or occlusion. No variant venous anatomy. Successful placement of the IVC filter below the level of the renal veins. IMPRESSION: Successful ultrasound and fluoroscopically guided placement of an infrarenal retrievable IVC filter via right jugular approach. PLAN: Due to patient related comorbidities and/or clinical  necessity, this IVC filter should be considered a permanent device. This patient will not be actively followed for future filter retrieval. Electronically Signed   By: Sandi Mariscal M.D.   On: 06/02/2021 10:24   CARDIAC CATHETERIZATION  Result Date: 06/02/2021 1. Moderate pulmonary arterial hypertension with PVR 4.8 WU. 2. Normal PCWP. 3. Mildly elevated RA and RVEDP. 4. Preserved cardiac output. Continue home PH medications.  Will give 1 dose of Lasix 40 mg IV.   NM Pulmonary Perf and Vent  Result Date: 05/31/2021 CLINICAL DATA:  Back pain, chest pain, shortness of breath EXAM: NUCLEAR MEDICINE PERFUSION LUNG SCAN TECHNIQUE: Perfusion images were obtained  in multiple projections after intravenous injection of radiopharmaceutical. Ventilation scans intentionally deferred if perfusion scan and chest x-ray adequate for interpretation during COVID 19 epidemic. RADIOPHARMACEUTICALS:  Four mCi Tc-27mMAA IV COMPARISON:  Chest radiograph done earlier today FINDINGS: There is small linear wedge-shaped area of decreased tracer uptake in the posterior right upper lung fields. No other focal abnormality is seen. Evaluation is limited without ventilation images. IMPRESSION: There is small linear wedge shaped area of decreased uptake in the posterior right upper lung fields. Study is indeterminate to evaluate for pulmonary embolism. If there is continued clinical suspicion for pulmonary embolism CT pulmonary angiogram and possibly venous Doppler examination of lower extremities may be considered. Electronically Signed   By: PElmer PickerM.D.   On: 05/31/2021 16:06   DG CHEST PORT 1 VIEW  Result Date: 06/01/2021 CLINICAL DATA:  Shortness of breath and tachycardia EXAM: PORTABLE CHEST 1 VIEW COMPARISON:  Yesterday FINDINGS: Cardiomegaly and vascular pedicle widening. Chronic interstitial coarsening and reticulation. Recent high resolution chest CT. No effusion or pneumothorax. IMPRESSION: Cardiomegaly and vascular  congestion/interstitial edema. No new abnormality. Electronically Signed   By: JJorje GuildM.D.   On: 06/01/2021 06:01   DG Shoulder Left Port  Result Date: 05/31/2021 CLINICAL DATA:  Left shoulder pain EXAM: LEFT SHOULDER COMPARISON:  None. FINDINGS: There is no evidence of fracture or dislocation. No significant arthropathy or other focal bone abnormality. Soft tissues are unremarkable. IMPRESSION: No acute osseous abnormality Electronically Signed   By: LYetta GlassmanM.D.   On: 05/31/2021 10:37   VAS [place]OWER EXTREMITY VENOUS (DVT)  Result Date: 05/31/2021  Lower Venous DVT Study Patient Name:  SNAZARIAH CADET Date of Exam:   05/31/2021 Medical Rec #: [id number]         Accession #:    [id number] of Birth: 409-22-1937          Patient Gender: F Patient Age:   89years [facility]      VAS [place]OWER EXTREMITY VENOUS (DVT) Referring Phys: WLoree FeeHARRIS --------------------------------------------------------------------------------  Indications: Edema.  Comparison Study: 03/11/2021 bilateral lower extremity venous duplex- age                   indeterminate DVT right popliteal vein. Performing Technologist: MMaudry MayhewMHA, RDMS, RVT, RDCS  Examination Guidelines: A complete evaluation includes B-mode imaging, spectral Doppler, color Doppler, and power Doppler as needed of all accessible portions of each vessel. Bilateral testing is considered an integral part of a complete examination. Limited examinations for reoccurring indications may be performed as noted. The reflux portion of the exam is performed with the patient in reverse Trendelenburg.  +---------+---------------+---------+-----------+----------+-----------------+  RIGHT     Compressibility Phasicity Spontaneity Properties Thrombus Aging     +---------+---------------+---------+-----------+----------+-----------------+  CFV       Full            Yes       Yes                                        +---------+---------------+---------+-----------+----------+-----------------+  SFJ       Full                                                                +---------+---------------+---------+-----------+----------+-----------------+  FV Prox   Full                                                                +---------+---------------+---------+-----------+----------+-----------------+  FV Mid    Full                                                                +---------+---------------+---------+-----------+----------+-----------------+  FV Distal Full                                                                +---------+---------------+---------+-----------+----------+-----------------+  PFV       Full                                                                +---------+---------------+---------+-----------+----------+-----------------+  POP       Partial         Yes       Yes                    Age Indeterminate  +---------+---------------+---------+-----------+----------+-----------------+  PTV       Full                                                                +---------+---------------+---------+-----------+----------+-----------------+  PERO      Full                                                                +---------+---------------+---------+-----------+----------+-----------------+   +---------+---------------+---------+-----------+----------+--------------+  LEFT      Compressibility Phasicity Spontaneity Properties Thrombus Aging  +---------+---------------+---------+-----------+----------+--------------+  CFV       Full            Yes       Yes                                    +---------+---------------+---------+-----------+----------+--------------+  SFJ       Full                                                             +---------+---------------+---------+-----------+----------+--------------+  FV Prox   Full                                                              +---------+---------------+---------+-----------+----------+--------------+  FV Mid    Full                                                             +---------+---------------+---------+-----------+----------+--------------+  FV Distal Full                                                             +---------+---------------+---------+-----------+----------+--------------+  PFV       Full                                                             +---------+---------------+---------+-----------+----------+--------------+  POP       Full            Yes       Yes                                    +---------+---------------+---------+-----------+----------+--------------+  PTV       Full                                                             +---------+---------------+---------+-----------+----------+--------------+  PERO      Full                                                             +---------+---------------+---------+-----------+----------+--------------+     Summary: RIGHT: - Findings consistent with age indeterminate deep vein thrombosis involving the right popliteal vein. - No cystic structure found in the popliteal fossa.  LEFT: - Findings appear essentially unchanged compared to previous examination. - There is no evidence of deep vein thrombosis in the lower extremity.  - No cystic structure found in the popliteal fossa.  *See table(s) above for measurements and observations. Electronically signed by Servando Snare MD on 05/31/2021 at 2:34:50 PM.    Final    ECHOCARDIOGRAM LIMITED  Result Date: 05/31/2021    ECHOCARDIOGRAM LIMITED REPORT   Patient Name:   GISSELLA NIBLACK Date of Exam: 05/31/2021 Medical Rec #:  [id number]  Height:       58.0 in Accession #:    [id number]        Weight:       88.2 lb Date of Birth:  12-27-1935          BSA:          1.286 m Patient Age:    41 years          BP:           128/72 mmHg Patient Gender: F                 HR:           65 bpm. Exam Location:   Inpatient Procedure: Limited Echo, Limited Color Doppler and Cardiac Doppler Indications:    dyspnea  History:        Patient has prior history of Echocardiogram examinations, most                 recent 03/12/2021. Raynaud's. Scleroderma. Chronic kidney                 disease., Signs/Symptoms:Dyspnea; Risk Factors:Hypertension.  Sonographer:    Johny Chess RDCS Referring Phys: [place]  1. Left ventricular ejection fraction, by estimation, is 60 to 65%. The left ventricle has normal function. The left ventricle has no regional wall motion abnormalities. Left ventricular diastolic parameters are consistent with Grade II diastolic dysfunction (pseudonormalization). Elevated left atrial pressure. There is the interventricular septum is flattened in systole, consistent with right ventricular pressure overload.  2. Right ventricular systolic function is normal. There is severely elevated pulmonary artery systolic pressure. The estimated right ventricular systolic pressure is 17.4 mmHg.  3. The mitral valve is normal in structure. No evidence of mitral valve regurgitation. No evidence of mitral stenosis.  4. Tricuspid valve regurgitation is moderate to severe.  5. The aortic valve is normal in structure. Aortic valve regurgitation is mild. No aortic stenosis is present.  6. The inferior vena cava is normal in size with greater than 50% respiratory variability, suggesting right atrial pressure of 3 mmHg. FINDINGS  Left Ventricle: Left ventricular ejection fraction, by estimation, is 60 to 65%. The left ventricle has normal function. The left ventricle has no regional wall motion abnormalities. The left ventricular internal cavity size was normal in size. There is  no left ventricular hypertrophy. The interventricular septum is flattened in systole, consistent with right ventricular pressure overload. Left ventricular diastolic parameters are consistent with Grade II diastolic dysfunction  (pseudonormalization). Elevated left atrial pressure. Right Ventricle: No increase in right ventricular wall thickness. Right ventricular systolic function is normal. There is severely elevated pulmonary artery systolic pressure. The tricuspid regurgitant velocity is 4.01 m/s, and with an assumed right atrial pressure of 3 mmHg, the estimated right ventricular systolic pressure is 08.1 mmHg. Left Atrium: Left atrial size was normal in size. Right Atrium: Right atrial size was normal in size. Pericardium: There is no evidence of pericardial effusion. Mitral Valve: The mitral valve is normal in structure. There is mild thickening of the mitral valve leaflet(s). No evidence of mitral valve stenosis. Tricuspid Valve: The tricuspid valve is normal in structure. Tricuspid valve regurgitation is moderate to severe. No evidence of tricuspid stenosis. Aortic Valve: The aortic valve is normal in structure. Aortic valve regurgitation is mild. Aortic regurgitation PHT measures 503 msec. No aortic stenosis is present. Pulmonic Valve: The pulmonic valve was normal in structure. Pulmonic valve regurgitation is trivial.  No evidence of pulmonic stenosis. Aorta: The aortic root is normal in size and structure. Venous: The inferior vena cava is normal in size with greater than 50% respiratory variability, suggesting right atrial pressure of 3 mmHg. IAS/Shunts: No atrial level shunt detected by color flow Doppler. LEFT VENTRICLE PLAX 2D LVIDd:         4.06 cm Diastology LVIDs:         2.18 cm LV e' medial:    5.55 cm/s LV PW:         1.10 cm LV E/e' medial:  15.2 LV IVS:        0.75 cm LV e' lateral:   5.33 cm/s                        LV E/e' lateral: 15.9  LEFT ATRIUM         Index LA diam:    3.20 cm 2.49 cm/m  AORTIC VALVE AI PHT:      503 msec  AORTA Ao Asc diam: 3.10 cm MITRAL VALVE               TRICUSPID VALVE MV Area (PHT): 3.53 cm    TR Peak grad:   64.3 mmHg MV Decel Time: 215 msec    TR Vmax:        401.00 cm/s MV E  velocity: 84.50 cm/s MV A velocity: 85.10 cm/s MV E/A ratio:  0.99 Mihai Croitoru MD Electronically signed by Sanda Klein MD Signature Date/Time: 05/31/2021/1:23:45 PM    Final

## 2021-06-04 NOTE — Progress Notes (Signed)
Progress Note  Patient Name: Meghan Wise Date of Encounter: 06/04/2021  [facility] Cardiologist: None   Subjective   NAEO. Restarted torsemide yesterday.  Inpatient Medications    Scheduled Meds:  sodium chloride   Intravenous Once   ambrisentan  10 mg Oral Daily   amiodarone  200 mg Oral BID   atorvastatin  20 mg Oral Daily   ferrous sulfate  325 mg Oral Daily   folic acid  283 mcg Oral QPM   levothyroxine  50 mcg Oral Q0600   pantoprazole (PROTONIX) IV  40 mg Intravenous Q12H   predniSONE  5 mg Oral Q breakfast   sodium chloride flush  3 mL Intravenous Q12H   tadalafil  40 mg Oral QPM   torsemide  10 mg Oral Daily   vitamin B-12  1,000 mcg Oral Daily   Continuous Infusions:  sodium chloride     PRN Meds: sodium chloride, acetaminophen **OR** acetaminophen, ALPRAZolam, iohexol, loperamide, melatonin, morphine injection, sodium chloride flush, traMADol   Vital Signs    Vitals:   06/03/21 2027 06/03/21 2356 06/04/21 0334 06/04/21 0719  BP:  (!) 136/48 (!) 132/96 113/89  Pulse:  61 62 70  Resp:  _0 Temp: 98.8 F (37.1 C) 98.5 F (36.9 C) 98.7 F (37.1 C) 98.7 F (37.1 C)  TempSrc: Oral Oral Oral Oral  SpO2:  96% 92% 100%  Weight:      Height:        Intake/Output Summary (Last 24 hours) at 06/04/2021 0934 Last data filed at 06/04/2021 0800 Gross per 24 hour  Intake 400 ml  Output --  Net 400 ml   Last 3 Weights 05/30/2021 05/27/2021 03/23/2021  Weight (lbs) 88 lb 2.9 oz 89 lb 6.4 oz 85 lb 6.4 oz  Weight (kg) 40 kg 40.552 kg 38.737 kg      Telemetry    Personally Reviewed  ECG    Personally Reviewed  Physical Exam   GEN: No acute distress.  Frail. Neck: No JVD Cardiac: RRR, no murmurs, rubs, or gallops.  Respiratory: Clear to auscultation bilaterally. GI: Soft, nontender, non-distended  MS: No edema; No deformity. Neuro:  Nonfocal  Psych: Normal affect   Labs    High Sensitivity Troponin:   Recent Labs  Lab  05/31/21 0920 06/01/21 0622 06/01/21 1130  TROPONINIHS 24* 27* 104*     Chemistry Recent Labs  Lab 06/02/21 0445 06/03/21 0058 06/04/21 0101  NA 140 140 143  K 3.4* 4.2 3.6  CL 105 108 110  CO2 21* 20* 22  GLUCOSE 91 95 95  BUN 52* 45* 42*  CREATININE 1.96* 1.81* 1.90*  CALCIUM 7.1* 7.0* 7.0*  MG 2.3 2.1 2.1  PROT 5.7* 5.6* 5.5*  ALBUMIN 2.4* 2.3* 2.4*  AST 109* 58* 34  ALT 155* 112* 84*  ALKPHOS 176* 175* 161*  BILITOT 0.5 0.5 0.6  GFRNONAA 25* 27* 26*  ANIONGAP _1 Lipids No results for input(s): CHOL, TRIG, HDL, LABVLDL, LDLCALC, CHOLHDL in the last 168 hours.  Hematology Recent Labs  Lab 06/02/21 0445 06/03/21 0058 06/04/21 0101  WBC 8.2 6.9 6.6  RBC 3.58* 3.36* 3.44*  HGB 10.1* 9.3* 9.8*  HCT 30.8* 29.4* 30.2*  MCV 86.0 87.5 87.8  MCH 28.2 27.7 28.5  MCHC 32.8 31.6 32.5  RDW 21.9* 21.7* 21.2*  PLT 356 309 308   Thyroid  Recent Labs  Lab 06/01/21 0622  TSH 3.596    BNP  Recent Labs  Lab 06/02/21 0445 06/03/21 0058 06/04/21 0101  BNP 1,361.6* 1,405.0* 1,701.1*    DDimer No results for input(s): DDIMER in the last 168 hours.   Radiology    IR IVC FILTER PLMT / S&I Burke Keels GUID/MOD SED  Result Date: 06/02/2021 INDICATION: History of scleroderma with associated interstitial lung disease. Persistent DVT despite anticoagulation. Additionally, patient has experienced lower GI bleeding as a sequela of anticoagulation As such, patient presents today for IVC filter placement for caval interruption purposes. Given patient's advanced age and multiple medical comorbidities, the IVC filter will be considered a permanent device and she will NOT be actively followed by the interventional radiology service for retrieval. EXAM: ULTRASOUND GUIDANCE FOR VASCULAR ACCESS IVC CATHETERIZATION AND VENOGRAM IVC FILTER INSERTION COMPARISON:  CT abdomen and pelvis-05/25/2012 MEDICATIONS: None. ANESTHESIA/SEDATION: Moderate (conscious) sedation was employed during this  procedure as administered by the Interventional Radiology RN. A total of Versed 0.5 mg was administered intravenously. Moderate Sedation Time: 10 minutes. The patient's level of consciousness and vital signs were monitored continuously by radiology nursing throughout the procedure under my direct supervision. CONTRAST:  None, CO2 was utilized for the examination given patient's chronic renal insufficiency. FLUOROSCOPY TIME:  1 minute, 6 seconds (7 mGy) COMPLICATIONS: None immediate PROCEDURE: Informed consent was obtained from the patient following explanation of the procedure, risks, benefits and alternatives. The patient understands, agrees and consents for the procedure. All questions were addressed. A time out was performed prior to the initiation of the procedure. Maximal barrier sterile technique utilized including caps, mask, sterile gowns, sterile gloves, large sterile drape, hand hygiene, and Betadine prep. Under sterile condition and local anesthesia, right internal jugular venous access was performed with ultrasound. An ultrasound image was saved and sent to PACS. Over a guidewire, the IVC filter delivery sheath and inner dilator were advanced into the IVC just above the IVC bifurcation. CO2 injection was performed for an IVC venogram. Through the delivery sheath, a retrievable Denali IVC filter was deployed below the level of the renal veins and above the IVC bifurcation. Limited post deployment venacavagram was performed. The delivery sheath was removed and hemostasis was obtained with manual compression. A dressing was applied. The patient tolerated the procedure well without immediate post procedural complication. FINDINGS: The IVC is patent. No evidence of thrombus, stenosis, or occlusion. No variant venous anatomy. Successful placement of the IVC filter below the level of the renal veins. IMPRESSION: Successful ultrasound and fluoroscopically guided placement of an infrarenal retrievable IVC filter  via right jugular approach. PLAN: Due to patient related comorbidities and/or clinical necessity, this IVC filter should be considered a permanent device. This patient will not be actively followed for future filter retrieval. Electronically Signed   By: Sandi Mariscal M.D.   On: 06/02/2021 10:24    Cardiac Studies      Assessment & Plan    86yo woman with chronic diastolic HF, pulm HTN, chronic resp failure, chronic DVT on [facility] admitted with GI bleed.  #Pulmonary HTN #RV failure Outpatient follow up. Continue torsemide  #AF Not an AC candidate b/c GI bleeding Continue amiodarone 270m PO BID x 5 more days followed by once daily  #PE    [facility] will sign off.    For questions or updates, please contact CSuperiorPlease consult [email or web address] for contact info under        Signed, CVickie Epley MD  06/04/2021, 9:34 AM

## 2021-06-05 LAB — CBC
HCT: 29.9 % — ABNORMAL LOW (ref 36.0–46.0)
Hemoglobin: 9 g/dL — ABNORMAL LOW (ref 12.0–15.0)
MCH: 26.9 pg (ref 26.0–34.0)
MCHC: 30.1 g/dL (ref 30.0–36.0)
MCV: 89.3 fL (ref 80.0–100.0)
Platelets: 290 10*3/uL (ref 150–400)
RBC: 3.35 MIL/uL — ABNORMAL LOW (ref 3.87–5.11)
RDW: 20.5 % — ABNORMAL HIGH (ref 11.5–15.5)
WBC: 6.8 10*3/uL (ref 4.0–10.5)
nRBC: 0 % (ref 0.0–0.2)

## 2021-06-05 LAB — BASIC METABOLIC PANEL
Anion gap: 10 (ref 5–15)
BUN: 43 mg/dL — ABNORMAL HIGH (ref 8–23)
CO2: 22 mmol/L (ref 22–32)
Calcium: 6.9 mg/dL — ABNORMAL LOW (ref 8.9–10.3)
Chloride: 110 mmol/L (ref 98–111)
Creatinine, Ser: 1.75 mg/dL — ABNORMAL HIGH (ref 0.44–1.00)
GFR, Estimated: 29 mL/min — ABNORMAL LOW (ref >60)
Glucose, Bld: 90 mg/dL (ref 70–99)
Potassium: 3.6 mmol/L (ref 3.5–5.1)
Sodium: 142 mmol/L (ref 135–145)

## 2021-06-05 MED ORDER — PANTOPRAZOLE SODIUM 40 MG PO TBEC: 40.0000 mg | DELAYED_RELEASE_TABLET | Freq: Two times a day (BID) | ORAL | 1 refills | Status: AC

## 2021-06-05 MED ORDER — AMIODARONE HCL 200 MG PO TABS: ORAL_TABLET | ORAL | 0 refills | Status: AC

## 2021-06-05 NOTE — Discharge Instructions (Signed)
Please follow-up with either your PCP, or GI physician by Friday to follow on CBC results , so you can resume Eliquis as long your CBC has been stable.

## 2021-06-05 NOTE — TOC Transition Note (Signed)
Transition of Care (TOC) - CM/SW Discharge Note Marvetta Gibbons RN, BSN Transitions of Care Unit 4E- RN Case Manager See Treatment Team for direct phone #  Weekend coverage  Patient Details  Name: Meghan Wise MRN: [id number] Date of Birth: [birth year]  Transition of Care [facility]) CM/SW Contact:  Dawayne Patricia, RN Phone Number: 06/05/2021, 10:09 AM   Clinical Narrative:    Pt stable for transition home, noted weekend CM reached out to [place] at [place] yesterday, this Probation officer has faxed [place] orders to [place] as per pt choice noted in previous CM note. Call also made to [facility] at [place] left regarding [place] needs. CM will f/u in am for [facility] needs to be sure fax was received and [facility] can service pt at [facility].    Final next level of care: [place] Barriers to Discharge: No Barriers Identified   Patient Goals and CMS Choice Patient states their goals for this hospitalization and ongoing recovery are:: to get better   Choice offered to / list presented to : Patient, Adult Children  Discharge Placement               Home w/ [facility]        Discharge Plan and Services   Discharge Planning Services: CM Consult Post Acute Care Choice: Home Health          DME Arranged: N/A DME Agency: NA       HH Arranged: RN, PT, OT HH Agency: Other - See comment Date HH Agency Contacted: 06/04/21 Time [place]: [id number] Representative spoke with at [place]: Rollene Fare at [place] ([facility] [place]) Interventions     Readmission Risk Interventions Readmission Risk Prevention Plan 06/05/2021  Transportation Screening Complete  PCP or Specialist Appt within 3-5 Days Complete  HRI or [place] Complete  Social Work Consult for [place] Planning/Counseling [place] Not Applicable  Medication Review Press photographer) Complete  Some recent data might be hidden

## 2021-06-05 NOTE — Progress Notes (Signed)
Mobility Specialist Progress Note:   06/05/21 0920  Mobility  Activity Ambulated with assistance in hallway  Level of Assistance Standby assist, set-up cues, supervision of patient - no hands on  Assistive Device Front wheel walker  Distance Ambulated (ft) 250 ft  Activity Response Tolerated well  $Mobility charge 1 Mobility   Pt eager for OOB mobility this am. No requiring any physical assist throughout session. Session performed on 6LO2, pt becoming SOB at end of session. Left pt sitting up in chair with all needs met.  Nelta Numbers Mobility Specialist  Phone: [phone]

## 2021-06-05 NOTE — Discharge Summary (Signed)
Physician Discharge Summary  Meghan Wise TXM:[id number] DOB: 05-06-1935 DOA: 05/30/2021  PCP: Collene Leyden, MD  Admit date: 05/30/2021 Discharge date: 06/05/2021  Admitted From: Home Disposition:  Home   Recommendations for Outpatient Follow-up:  Follow up with PCP/or GI physician by Friday, and if your CBC is stable please resume back on Eliquis with close monitoring of CBC.  Home Health:YES   Discharge Condition:Stable CODE STATUS: DNR Diet recommendation: Heart Healthy  Brief/Interim Summary:  86 y.o. female with medical history significant of hypertension, hyperlipidemia, hypothyroidism, CKD stage IIIb, scleroderma, chronic diastolic CHF, pulmonary hypertension, chronic respiratory failure on 4 L oxygen, simply diagnosed with DVT and possible PE and was placed on Eliquis.  She claims that a few days ago she had dark-colored stool which was almost black in color, subsequently went to see a pulmonologist for her underlying pulmonary hypertension where she had blood work and was found to have hemoglobin of 6.8 and then asked to come to the ER at [facility] for further evaluation of GI bleed and acute anemia.  As well she was noted to have A-fib with RVR on 06/01/2021.  Where she did convert to NSR without any intervention, she had a right heart cath on 2/8, and she had IVC filter placed 2/9.  RHC Procedural Findings: 06/01/2021 Hemodynamics (mmHg) RA mean 11 RV 57/13 PA 57/16, mean 33 PCWP mean 10 Oxygen saturations: PA 60% AO 96% Cardiac Output (Fick) 4.79  Cardiac Index (Fick) 3.72 PVR 4.8 WU  Acute on chronic hypoxic respiratory failure -Known underlying pulmonary hypertension, chronic respiratory failure on 5 L nasal cannula -Has worsened due to anemia in the setting of acute GI bleed.  -PCCM . cardiology input greatly appreciated. -She is currently back at her baseline of 4 L nasal cannula.  -No evidence of ILD on CT chest -Respiratory status currently back to baseline.    Acute GI blood loss related anemia.   -She was recently placed on Eliquis and had melanotic stools few days ago.  Case discussed with Dr. Benson Norway.  No invasive procedure right now due to her frail status.  Supportive care and hold Eliquis , and to monitor CBC closely, transfuse as needed, she did require 1 unit PRBC transfusion during hospital stay, her hemoglobin has been stable over the last 4 days, she was kept on IV Protonix, she will be discharged on Protonix 40 mg p.o. twice daily, to have repeat CBC done as an outpatient in 4 days, and if hemoglobin remains stable she can resume Eliquis by Friday.   -Did home health to be arranged to draw CBC this Thursday and to send to PCP and GI office . -She will DC on Protonix 40 mg p.o. twice daily, and with recommendation to follow-up with either GI or PCP by Friday to follow on CBC lab , she can be resumed on Eliquis by Friday as long her hemoglobin is stable.Marland Kitchen   History of DVT in November 2022.  With current DVT, likely PE - Eliquis held due GI bleed . -She has equivocal VQ scan which may indicate a new PE, or extremity Dopplers showing right popliteal vein DVT. -At this point cannot do anticoagulation due to GI bleed, s/p IVC filter placement by IR on 2/9. -Please see above discussion regarding resuming Eliquis by Friday if CBC is stable.   Advanced pulmonary hypertension.   -He is requiring oxygen at baseline between 3 to 5 L of nasal cannula oxygen at home.  -Right heart cath 2/8, management.  PCCM and advanced CHF team . - She has been Opsumit and tadalafil at home, did not tolerate Tyvaso. Per CHF team"Moderate pulmonary arterial hypertension by RHC with PVR 4.8 WU.  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  She has been Opsumit and tadalafil at home, did not tolerate Tyvaso.  Per Dr. Matilde Bash review of recent high resolution CT chest, no significant ILD was present.  Echo on my review showed EF 60-65% with D-shaped septum,  moderate RV dilation, mild RV dysfunction.  RHC on 2/8 showed normal PCWP, mildly elevated RA pressure. Suspect mild RV failure by exam.  - Continue home Opsumit and tadalafil. - Consider Uptravi as outpatient. "    A-fib with RVR - no anticoagulation due to GI bleed.  Eliquis can be resumed by Friday if CBC is stable. -Currently she maintained normal sinus rhythm, continue with amiodarone 200 mg twice daily x5 days then once daily per cardiology recommendation.   Transaminitis -Sudden elevation overnight, most likely due to low blood pressure, trending down.   History of chronic diastolic CHF.  Last known EF in November 2022 is 65%.  Cardiology has been consulted, currently appears euvolemic, poor candidate for left or right heart cath due to multiple comorbidities and extremely frail status.  Continue home dose Demadex for now. -Appears to be well compensated on right heart cath, diuresis per cardiology.  On home dose torsemide as her creatinine has been improving.   Dyslipidemia.  On statin.   Severe protein calorie malnutrition due to underlying pulmonary hypertension, scleroderma and advanced age.  Supportive care.  She is DNR and will involve palliative care also for goals of care.   Hypertension.  On Norvasc.   Hypothyroidism.  On Synthroid.   History of scleroderma.  Supportive care.   AKI on CKD stage IIIb.  Baseline creatinine around 1.6.  Follows with Dr. Marval Regal.     Ongoing left shoulder pain.   -X-ray with no acute findings   Hypokalemia -Replete, target potassium > 4 due to A-fib    Discharge Diagnoses:  Principal Problem:   Dyspnea Active Problems:   HTN (hypertension)   Hypothyroidism   Chronic diastolic CHF (congestive heart failure), NYHA class 4 (HCC)   Scleroderma (HCC)   Pulmonary hypertension (HCC)   Symptomatic anemia   Chronic kidney disease, stage 3b (HCC)   History of venous thromboembolism   Leukocytosis   Pressure injury of  skin    Discharge Instructions  Discharge Instructions     Diet - low sodium heart healthy   Complete by: As directed    Discharge instructions   Complete by: As directed    Please follow-up with either your PCP, or GI physician by Friday to follow on CBC results , so you can resume Eliquis as long your CBC has been stable.   Increase activity slowly   Complete by: As directed    No wound care   Complete by: As directed       Allergies as of 06/05/2021       Reactions   Losartan Other (See Comments), Cough   High calcium count and renal problems   Nsaids    Other reaction(s): Kidney Disorder   Ace Inhibitors Other (See Comments), Cough   Dizziness and coughing   Sulfa Antibiotics Hives   Codeine Nausea Only   Heparin Anxiety, Other (See Comments)   Pt reports that they have a sensitivity towards heparin. Pt becomes depressed and anxious to the point  of crying uncontrollably.         Medication List     STOP taking these medications    amLODipine 5 MG tablet Commonly known as: NORVASC   apixaban 2.5 MG Tabs tablet Commonly known as: ELIQUIS   metroNIDAZOLE 500 MG tablet Commonly known as: FLAGYL   omeprazole 20 MG capsule Commonly known as: PRILOSEC       TAKE these medications    Acetaminophen 500 MG capsule Take 1,000 mg by mouth every 6 (six) hours as needed for fever.   ambrisentan 10 MG tablet Commonly known as: LETAIRIS Take 10 mg by mouth daily.   amiodarone 200 MG tablet Commonly known as: PACERONE Take 1 tablet (200 mg total) by mouth 2 (two) times daily for 4 days, THEN 1 tablet (200 mg total) daily. Start taking on: June 05, 2021   atorvastatin 20 MG tablet Commonly known as: LIPITOR Take 20 mg by mouth daily.   denosumab 60 MG/ML Sosy injection Commonly known as: PROLIA Inject 60 mg into the skin every 6 (six) months.   ferrous sulfate 324 MG Tbec Take 324 mg by mouth daily.   folic acid 562 MCG tablet Commonly known as:  FOLVITE Take 400 mcg by mouth every evening.   levothyroxine 50 MCG tablet Commonly known as: SYNTHROID Take 50 mcg by mouth every morning.   loperamide 2 MG tablet Commonly known as: IMODIUM A-D Take 4 mg by mouth 4 (four) times daily as needed for diarrhea or loose stools.   Melatonin 10 MG Tabs Take 10 mg by mouth daily as needed (sleep).   OXYGEN Inhale 2-4 L into the lungs continuous. 4 L with exertion 2 L at night   pantoprazole 40 MG tablet Commonly known as: PROTONIX Take 1 tablet (40 mg total) by mouth 2 (two) times daily.   predniSONE 5 MG tablet Commonly known as: DELTASONE Take 5 mg by mouth daily with breakfast.   tadalafil 20 MG tablet Commonly known as: CIALIS Take 40 mg by mouth every evening.   torsemide 5 MG tablet Commonly known as: DEMADEX Take 1 tablet (5 mg total) by mouth daily. What changed: how much to take   VITAMIN B-12 ER PO Take 1 tablet by mouth daily.        Follow-up Information     Collene Leyden, MD Follow up in 4 day(s).   Specialty: Family Medicine Why: please make appointment this Friday/217 to follow on CBC results Contact information: [address] [place] [address] [phone]         Carol Ada, MD Follow up in 4 day(s).   Specialty: Gastroenterology Why: Try to make an appointment this Friday to follow CBC results. Contact information: [place] [address] [phone]                Allergies  Allergen Reactions   Losartan Other (See Comments) and Cough    High calcium count and renal problems   Nsaids     Other reaction(s): Kidney Disorder   Ace Inhibitors Other (See Comments) and Cough    Dizziness and coughing    Sulfa Antibiotics Hives   Codeine Nausea Only   Heparin Anxiety and Other (See Comments)    Pt reports that they have a sensitivity towards heparin. Pt becomes depressed and anxious to the point of crying uncontrollably.      Consultations: - Gastroenterology - Cardiology -  interventional radiology  - palliative medicine  Procedures/Studies: DG Chest 2 View  Result Date: 05/30/2021 CLINICAL DATA:  Shortness of breath. EXAM: CHEST - 2 VIEW COMPARISON:  Chest x-ray 05/20/2021.  Chest CT 12/24/2020. FINDINGS: Scarring is again seen in the bilateral lung apices. There is a stable calcified granuloma in the left lung apex. There is no new focal lung infiltrate, pleural effusion or pneumothorax identified. Cardiomediastinal silhouette is within normal limits. No acute fractures are seen. IMPRESSION: No evidence for pneumonia or edema. Stable chronic changes in the lung apices. Electronically Signed   By: Ronney Asters M.D.   On: 05/30/2021 21:31   DG Chest 2 View  Result Date: 05/20/2021 CLINICAL DATA:  Cough and shortness of breath.  COPD. EXAM: CHEST - 2 VIEW COMPARISON:  Chest radiograph 03/10/2021, chest CT 12/24/2020 FINDINGS: Cardiac silhouette is at the upper limits of normal size, unchanged. Calcification is again seen within the aortic arch. Moderate hyperinflation. There is again bilateral interstitial thickening chronic scarring. Cystic emphysematous changes are again seen. No definite pleural effusion is seen. Mild multilevel degenerative disc changes of the thoracic spine. IMPRESSION: Chronic changes of interstitial lung disease and emphysema. No definite acute lung process. Electronically Signed   By: Yvonne Kendall M.D.   On: 05/20/2021 18:38   IR IVC FILTER PLMT / S&I Burke Keels GUID/MOD SED  Result Date: 06/02/2021 INDICATION: History of scleroderma with associated interstitial lung disease. Persistent DVT despite anticoagulation. Additionally, patient has experienced lower GI bleeding as a sequela of anticoagulation As such, patient presents today for IVC filter placement for caval interruption purposes. Given patient's advanced age and multiple medical comorbidities, the IVC filter will be considered a  permanent device and she will NOT be actively followed by the interventional radiology service for retrieval. EXAM: ULTRASOUND GUIDANCE FOR VASCULAR ACCESS IVC CATHETERIZATION AND VENOGRAM IVC FILTER INSERTION COMPARISON:  CT abdomen and pelvis-05/25/2012 MEDICATIONS: None. ANESTHESIA/SEDATION: Moderate (conscious) sedation was employed during this procedure as administered by the Interventional Radiology RN. A total of Versed 0.5 mg was administered intravenously. Moderate Sedation Time: 10 minutes. The patient's level of consciousness and vital signs were monitored continuously by radiology nursing throughout the procedure under my direct supervision. CONTRAST:  None, CO2 was utilized for the examination given patient's chronic renal insufficiency. FLUOROSCOPY TIME:  1 minute, 6 seconds (7 mGy) COMPLICATIONS: None immediate PROCEDURE: Informed consent was obtained from the patient following explanation of the procedure, risks, benefits and alternatives. The patient understands, agrees and consents for the procedure. All questions were addressed. A time out was performed prior to the initiation of the procedure. Maximal barrier sterile technique utilized including caps, mask, sterile gowns, sterile gloves, large sterile drape, hand hygiene, and Betadine prep. Under sterile condition and local anesthesia, right internal jugular venous access was performed with ultrasound. An ultrasound image was saved and sent to PACS. Over a guidewire, the IVC filter delivery sheath and inner dilator were advanced into the IVC just above the IVC bifurcation. CO2 injection was performed for an IVC venogram. Through the delivery sheath, a retrievable Denali IVC filter was deployed below the level of the renal veins and above the IVC bifurcation. Limited post deployment venacavagram was performed. The delivery sheath was removed and hemostasis was obtained with manual compression. A dressing was applied. The patient tolerated the  procedure well without immediate post procedural complication. FINDINGS: The IVC is patent. No evidence of thrombus, stenosis, or occlusion. No variant venous anatomy. Successful placement of the IVC filter below the level of the renal veins. IMPRESSION: Successful ultrasound  and fluoroscopically guided placement of an infrarenal retrievable IVC filter via right jugular approach. PLAN: Due to patient related comorbidities and/or clinical necessity, this IVC filter should be considered a permanent device. This patient will not be actively followed for future filter retrieval. Electronically Signed   By: Sandi Mariscal M.D.   On: 06/02/2021 10:24   CARDIAC CATHETERIZATION  Result Date: 06/02/2021 1. Moderate pulmonary arterial hypertension with PVR 4.8 WU. 2. Normal PCWP. 3. Mildly elevated RA and RVEDP. 4. Preserved cardiac output. Continue home PH medications.  Will give 1 dose of Lasix 40 mg IV.   CT Chest High Resolution  Result Date: 05/31/2021 CLINICAL DATA:  86 year old female with history of hypoxemia. EXAM: CT CHEST WITHOUT CONTRAST TECHNIQUE: Multidetector CT imaging of the chest was performed following the standard protocol without intravenous contrast. High resolution imaging of the lungs, as well as inspiratory and expiratory imaging, was performed. RADIATION DOSE REDUCTION: This exam was performed according to the departmental dose-optimization program which includes automated exposure control, adjustment of the mA and/or kV according to patient size and/or use of iterative reconstruction technique. COMPARISON:  Chest CT 12/24/2020. FINDINGS: Cardiovascular: Heart size is mildly enlarged. There is no significant pericardial fluid, thickening or pericardial calcification. There is aortic atherosclerosis, as well as atherosclerosis of the great vessels of the mediastinum and the coronary arteries, including calcified atherosclerotic plaque in the left main, left anterior descending, left circumflex and  right coronary arteries. Calcifications of the aortic valve. Mediastinum/Nodes: No pathologically enlarged mediastinal or hilar lymph nodes. Please note that accurate exclusion of hilar adenopathy is limited on noncontrast CT scans. Esophagus is unremarkable in appearance. No axillary lymphadenopathy. Lungs/Pleura: Study is limited by considerable patient respiratory motion. With these limitations in mind, high-resolution images demonstrates some patchy areas of mild ground-glass attenuation and predominantly interlobular septal thickening throughout the lungs bilaterally, favored to reflect a background of interstitial pulmonary edema. No definitive regions of traction bronchiectasis or honeycombing are noted. There is some bilateral apical pleuroparenchymal thickening and architectural distortion, most compatible with areas of chronic post infectious or inflammatory scarring. No confluent consolidative airspace disease. No pleural effusions. No definite suspicious appearing pulmonary nodules or masses are noted. Inspiratory and expiratory imaging is considered nearly nondiagnostic, but suggests the presence of some mild air trapping. Upper Abdomen: Aortic atherosclerosis. Musculoskeletal: There are no aggressive appearing lytic or blastic lesions noted in the visualized portions of the skeleton. IMPRESSION: 1. Limited study demonstrating no definitive evidence to suggest interstitial lung disease. 2. There is cardiomegaly with evidence of interstitial pulmonary edema; imaging findings suggestive of congestive heart failure, as above. 3. Aortic atherosclerosis, in addition to left main and three-vessel coronary artery disease. 4. There are calcifications of the aortic valve. Echocardiographic correlation for evaluation of potential valvular dysfunction may be warranted if clinically indicated. 5. Mild air trapping, suggesting small airways disease. Aortic Atherosclerosis (ICD10-I70.0). Electronically Signed   By:  Vinnie Langton M.D.   On: 05/31/2021 04:47   NM Pulmonary Perf and Vent  Result Date: 05/31/2021 CLINICAL DATA:  Back pain, chest pain, shortness of breath EXAM: NUCLEAR MEDICINE PERFUSION LUNG SCAN TECHNIQUE: Perfusion images were obtained in multiple projections after intravenous injection of radiopharmaceutical. Ventilation scans intentionally deferred if perfusion scan and chest x-ray adequate for interpretation during COVID 19 epidemic. RADIOPHARMACEUTICALS:  Four mCi Tc-46mMAA IV COMPARISON:  Chest radiograph done earlier today FINDINGS: There is small linear wedge-shaped area of decreased tracer uptake in the posterior right upper lung fields. No other focal  abnormality is seen. Evaluation is limited without ventilation images. IMPRESSION: There is small linear wedge shaped area of decreased uptake in the posterior right upper lung fields. Study is indeterminate to evaluate for pulmonary embolism. If there is continued clinical suspicion for pulmonary embolism CT pulmonary angiogram and possibly venous Doppler examination of lower extremities may be considered. Electronically Signed   By: Elmer Picker M.D.   On: 05/31/2021 16:06   DG CHEST PORT 1 VIEW  Result Date: 06/01/2021 CLINICAL DATA:  Shortness of breath and tachycardia EXAM: PORTABLE CHEST 1 VIEW COMPARISON:  Yesterday FINDINGS: Cardiomegaly and vascular pedicle widening. Chronic interstitial coarsening and reticulation. Recent high resolution chest CT. No effusion or pneumothorax. IMPRESSION: Cardiomegaly and vascular congestion/interstitial edema. No new abnormality. Electronically Signed   By: Jorje Guild M.D.   On: 06/01/2021 06:01   DG Chest Port 1 View  Result Date: 05/31/2021 CLINICAL DATA:  86 year old female with history of shortness of breath. EXAM: PORTABLE CHEST 1 VIEW COMPARISON:  Chest x-ray 05/30/2021. FINDINGS: Mild diffuse interstitial prominence and diffuse peribronchial cuffing. Lung volumes are normal. No  consolidative airspace disease. No pleural effusions. No pneumothorax. Cephalization of the pulmonary vasculature. Heart size is mildly enlarged. The patient is rotated to the right on today's exam, resulting in distortion of the mediastinal contours and reduced diagnostic sensitivity and specificity for mediastinal pathology. Atherosclerotic calcifications in the thoracic aorta. IMPRESSION: 1. The appearance the chest suggests mild congestive heart failure, as above. Electronically Signed   By: Vinnie Langton M.D.   On: 05/31/2021 07:43   DG Shoulder Left Port  Result Date: 05/31/2021 CLINICAL DATA:  Left shoulder pain EXAM: LEFT SHOULDER COMPARISON:  None. FINDINGS: There is no evidence of fracture or dislocation. No significant arthropathy or other focal bone abnormality. Soft tissues are unremarkable. IMPRESSION: No acute osseous abnormality Electronically Signed   By: Yetta Glassman M.D.   On: 05/31/2021 10:37   VAS [place] LOWER EXTREMITY VENOUS (DVT)  Result Date: 05/31/2021  Lower Venous DVT Study Patient Name:  Meghan Wise  Date of Exam:   05/31/2021 Medical Rec #: [id number]          Accession #:    [id number] Date of Birth: 1936/01/03           Patient Gender: F Patient Age:   69 years Exam [facility]  [facility] Procedure:      VAS [place] LOWER EXTREMITY VENOUS (DVT) Referring Phys: Loree Fee HARRIS --------------------------------------------------------------------------------  Indications: Edema.  Comparison Study: 03/11/2021 bilateral lower extremity venous duplex- age                   indeterminate DVT right popliteal vein. Performing Technologist: Maudry Mayhew MHA, RDMS, RVT, RDCS  Examination Guidelines: A complete evaluation includes B-mode imaging, spectral Doppler, color Doppler, and power Doppler as needed of all accessible portions of each vessel. Bilateral testing is considered an integral part of a complete examination. Limited examinations for reoccurring indications may be  performed as noted. The reflux portion of the exam is performed with the patient in reverse Trendelenburg.  +---------+---------------+---------+-----------+----------+-----------------+  RIGHT     Compressibility Phasicity Spontaneity Properties Thrombus Aging     +---------+---------------+---------+-----------+----------+-----------------+  CFV       Full            Yes       Yes                                       +---------+---------------+---------+-----------+----------+-----------------+  SFJ       Full                                                                +---------+---------------+---------+-----------+----------+-----------------+  FV Prox   Full                                                                +---------+---------------+---------+-----------+----------+-----------------+  FV Mid    Full                                                                +---------+---------------+---------+-----------+----------+-----------------+  FV Distal Full                                                                +---------+---------------+---------+-----------+----------+-----------------+  PFV       Full                                                                +---------+---------------+---------+-----------+----------+-----------------+  POP       Partial         Yes       Yes                    Age Indeterminate  +---------+---------------+---------+-----------+----------+-----------------+  PTV       Full                                                                +---------+---------------+---------+-----------+----------+-----------------+  PERO      Full                                                                +---------+---------------+---------+-----------+----------+-----------------+   +---------+---------------+---------+-----------+----------+--------------+  LEFT      Compressibility Phasicity Spontaneity Properties Thrombus Aging   +---------+---------------+---------+-----------+----------+--------------+  CFV       Full            Yes       Yes                                    +---------+---------------+---------+-----------+----------+--------------+  SFJ       Full                                                             +---------+---------------+---------+-----------+----------+--------------+  FV Prox   Full                                                             +---------+---------------+---------+-----------+----------+--------------+  FV Mid    Full                                                             +---------+---------------+---------+-----------+----------+--------------+  FV Distal Full                                                             +---------+---------------+---------+-----------+----------+--------------+  PFV       Full                                                             +---------+---------------+---------+-----------+----------+--------------+  POP       Full            Yes       Yes                                    +---------+---------------+---------+-----------+----------+--------------+  PTV       Full                                                             +---------+---------------+---------+-----------+----------+--------------+  PERO      Full                                                             +---------+---------------+---------+-----------+----------+--------------+     Summary: RIGHT: - Findings consistent with age indeterminate deep vein thrombosis involving the right popliteal vein. - No cystic structure found in the popliteal fossa.  LEFT: - Findings appear essentially unchanged compared to previous examination. - There is no evidence of deep vein thrombosis in the lower extremity.  - No cystic structure  found in the popliteal fossa.  *See table(s) above for measurements and observations. Electronically signed by Servando Snare MD on 05/31/2021 at 2:34:50 PM.     Final    ECHOCARDIOGRAM LIMITED  Result Date: 05/31/2021    ECHOCARDIOGRAM LIMITED REPORT   Patient Name:   Meghan Wise Date of Exam: 05/31/2021 Medical Rec #:  [id number]         Height:       58.0 in Accession #:    [id number]        Weight:       88.2 lb Date of Birth:  04-14-1936          BSA:          1.286 m Patient Age:    23 years          BP:           128/72 mmHg Patient Gender: F                 HR:           65 bpm. Exam Location:  Inpatient Procedure: Limited Echo, Limited Color Doppler and Cardiac Doppler Indications:    dyspnea  History:        Patient has prior history of Echocardiogram examinations, most                 recent 03/12/2021. Raynaud's. Scleroderma. Chronic kidney                 disease., Signs/Symptoms:Dyspnea; Risk Factors:Hypertension.  Sonographer:    Johny Chess RDCS Referring Phys: [place]  1. Left ventricular ejection fraction, by estimation, is 60 to 65%. The left ventricle has normal function. The left ventricle has no regional wall motion abnormalities. Left ventricular diastolic parameters are consistent with Grade II diastolic dysfunction (pseudonormalization). Elevated left atrial pressure. There is the interventricular septum is flattened in systole, consistent with right ventricular pressure overload.  2. Right ventricular systolic function is normal. There is severely elevated pulmonary artery systolic pressure. The estimated right ventricular systolic pressure is 16.0 mmHg.  3. The mitral valve is normal in structure. No evidence of mitral valve regurgitation. No evidence of mitral stenosis.  4. Tricuspid valve regurgitation is moderate to severe.  5. The aortic valve is normal in structure. Aortic valve regurgitation is mild. No aortic stenosis is present.  6. The inferior vena cava is normal in size with greater than 50% respiratory variability, suggesting right atrial pressure of 3 mmHg. FINDINGS  Left Ventricle: Left  ventricular ejection fraction, by estimation, is 60 to 65%. The left ventricle has normal function. The left ventricle has no regional wall motion abnormalities. The left ventricular internal cavity size was normal in size. There is  no left ventricular hypertrophy. The interventricular septum is flattened in systole, consistent with right ventricular pressure overload. Left ventricular diastolic parameters are consistent with Grade II diastolic dysfunction (pseudonormalization). Elevated left atrial pressure. Right Ventricle: No increase in right ventricular wall thickness. Right ventricular systolic function is normal. There is severely elevated pulmonary artery systolic pressure. The tricuspid regurgitant velocity is 4.01 m/s, and with an assumed right atrial pressure of 3 mmHg, the estimated right ventricular systolic pressure is 73.7 mmHg. Left Atrium: Left atrial size was normal in size. Right Atrium: Right atrial size was normal in size. Pericardium: There is no evidence of pericardial effusion. Mitral Valve: The mitral valve is normal in structure. There is mild thickening of the  mitral valve leaflet(s). No evidence of mitral valve stenosis. Tricuspid Valve: The tricuspid valve is normal in structure. Tricuspid valve regurgitation is moderate to severe. No evidence of tricuspid stenosis. Aortic Valve: The aortic valve is normal in structure. Aortic valve regurgitation is mild. Aortic regurgitation PHT measures 503 msec. No aortic stenosis is present. Pulmonic Valve: The pulmonic valve was normal in structure. Pulmonic valve regurgitation is trivial. No evidence of pulmonic stenosis. Aorta: The aortic root is normal in size and structure. Venous: The inferior vena cava is normal in size with greater than 50% respiratory variability, suggesting right atrial pressure of 3 mmHg. IAS/Shunts: No atrial level shunt detected by color flow Doppler. LEFT VENTRICLE PLAX 2D LVIDd:         4.06 cm Diastology LVIDs:          2.18 cm LV e' medial:    5.55 cm/s LV PW:         1.10 cm LV E/e' medial:  15.2 LV IVS:        0.75 cm LV e' lateral:   5.33 cm/s                        LV E/e' lateral: 15.9  LEFT ATRIUM         Index LA diam:    3.20 cm 2.49 cm/m  AORTIC VALVE AI PHT:      503 msec  AORTA Ao Asc diam: 3.10 cm MITRAL VALVE               TRICUSPID VALVE MV Area (PHT): 3.53 cm    TR Peak grad:   64.3 mmHg MV Decel Time: 215 msec    TR Vmax:        401.00 cm/s MV E velocity: 84.50 cm/s MV A velocity: 85.10 cm/s MV E/A ratio:  0.99 Mihai Croitoru MD Electronically signed by Sanda Klein MD Signature Date/Time: 05/31/2021/1:23:45 PM    Final       Subjective:  No complaints overnight, dyspnea at baseline, no palpitation, she maintains normal sinus rhythm, patient report bowel movement x1 yesterday, stool dark in color which is baseline, no melena or bright red blood per rectum. Discharge Exam: Vitals:   06/05/21 0439 06/05/21 0700  BP: (!) 130/50 (!) 140/55  Pulse: 60 72  Resp: 20 19  Temp: 98.4 F (36.9 C) 98.4 F (36.9 C)  SpO2: 99% 100%   Vitals:   06/04/21 2300 06/04/21 2348 06/05/21 0439 06/05/21 0700  BP: (!) 132/55 (!) 132/55 (!) 130/50 (!) 140/55  Pulse: 60 (!) 53 60 72  Resp: _0 Temp:  98.2 F (36.8 C) 98.4 F (36.9 C) 98.4 F (36.9 C)  TempSrc:  Oral Oral Oral  SpO2: 96% 99% 99% 100%  Weight:      Height:        General: Pt is alert, awake, not in acute distress Cardiovascular: RRR, S1/S2 +, no rubs, no gallops Respiratory: CTA bilaterally, no wheezing, no rhonchi Abdominal: Soft, NT, ND, bowel sounds + Extremities: no edema, no cyanosis    The results of significant diagnostics from this hospitalization (including imaging, microbiology, ancillary and laboratory) are listed below for reference.     Microbiology: Recent Results (from the past 240 hour(s))  Resp Panel by RT-PCR (Flu A&B, Covid) Nasopharyngeal Swab     Status: None   Collection Time: 05/30/21  7:01 PM    Specimen: Nasopharyngeal Swab; Nasopharyngeal(NP) swabs in vial transport  medium  Result Value Ref Range Status   SARS Coronavirus 2 by RT PCR NEGATIVE NEGATIVE Final    Comment: (NOTE) SARS-CoV-2 target nucleic acids are NOT DETECTED.  The SARS-CoV-2 RNA is generally detectable in upper respiratory specimens during the acute phase of infection. The lowest concentration of SARS-CoV-2 viral copies this assay can detect is 138 copies/mL. A negative result does not preclude SARS-Cov-2 infection and should not be used as the sole basis for treatment or other patient management decisions. A negative result may occur with  improper specimen collection/handling, submission of specimen other than nasopharyngeal swab, presence of viral mutation(s) within the areas targeted by this assay, and inadequate number of viral copies(<138 copies/mL). A negative result must be combined with clinical observations, patient history, and epidemiological information. The expected result is Negative.  Fact Sheet for Patients:  EntrepreneurPulse.com.au  Fact Sheet for Healthcare Providers:  IncredibleEmployment.be  This test is no t yet approved or cleared by the [place] FDA and  has been authorized for detection and/or diagnosis of SARS-CoV-2 by FDA under an Emergency Use Authorization (EUA). This EUA will remain  in effect (meaning this test can be used) for the duration of the COVID-19 declaration under Section 564(b)(1) of the Act, 21 U.S.C.section 360bbb-3(b)(1), unless the authorization is terminated  or revoked sooner.       Influenza A by PCR NEGATIVE NEGATIVE Final   Influenza B by PCR NEGATIVE NEGATIVE Final    Comment: (NOTE) The Xpert Xpress SARS-CoV-2/FLU/RSV plus assay is intended as an aid in the diagnosis of influenza from Nasopharyngeal swab specimens and should not be used as a sole basis for treatment. Nasal washings and aspirates are  unacceptable for Xpert Xpress SARS-CoV-2/FLU/RSV testing.  Fact Sheet for Patients: EntrepreneurPulse.com.au  Fact Sheet for Healthcare Providers: IncredibleEmployment.be  This test is not yet approved or cleared by the [place] FDA and has been authorized for detection and/or diagnosis of SARS-CoV-2 by FDA under an Emergency Use Authorization (EUA). This EUA will remain in effect (meaning this test can be used) for the duration of the COVID-19 declaration under Section 564(b)(1) of the Act, 21 U.S.C. section 360bbb-3(b)(1), unless the authorization is terminated or revoked.  Performed at [facility], [place] [address], [place], [place] [address]      Labs: BNP (last 3 results) Recent Labs    06/02/21 0445 06/03/21 0058 06/04/21 0101  BNP 1,361.6* 1,405.0* 9,702.6*   Basic Metabolic Panel: Recent Labs  Lab 05/31/21 0257 06/01/21 0133 06/01/21 1415 06/01/21 1418 06/02/21 0445 06/03/21 0058 06/04/21 0101 06/05/21 0050  NA 145 137   < > 140 140 140 143 142  K 4.1 4.0   < > 3.7 3.4* 4.2 3.6 3.6  CL 110 103  --   --  105 108 110 110  CO2 24 22  --   --  21* 20* 22 22  GLUCOSE 90 97  --   --  91 95 95 90  BUN 46* 41*  --   --  52* 45* 42* 43*  CREATININE 1.52* 1.69*  --   --  1.96* 1.81* 1.90* 1.75*  CALCIUM 8.0* 7.3*  --   --  7.1* 7.0* 7.0* 6.9*  MG 2.4 2.3  --   --  2.3 2.1 2.1  --    < > = values in this interval not displayed.   Liver Function Tests: Recent Labs  Lab 05/30/21 1931 06/01/21 0133 06/02/21 0445 06/03/21 0058 06/04/21 0101  AST  28 359* 109* 58* 34  ALT 26 242* 155* 112* 84*  ALKPHOS 62 210* 176* 175* 161*  BILITOT 0.3 0.8 0.5 0.5 0.6  PROT 6.5 5.6* 5.7* 5.6* 5.5*  ALBUMIN 3.0* 2.5* 2.4* 2.3* 2.4*   No results for input(s): LIPASE, AMYLASE in the last 168 hours. No results for input(s): AMMONIA in the last 168 hours. CBC: Recent Labs  Lab 05/30/21 1931 05/30/21 2213 06/01/21 0133  06/01/21 1415 06/01/21 1418 06/02/21 0445 06/03/21 0058 06/04/21 0101 06/05/21 0050  WBC 11.2*   < > 9.9  --   --  8.2 6.9 6.6 6.8  NEUTROABS 9.8*  --  8.1*  --   --  6.4 5.5 5.2  --   HGB 7.4*   < > 7.3*   < > 10.2* 10.1* 9.3* 9.8* 9.0*  HCT 23.8*   < > 23.1*   < > 30.0* 30.8* 29.4* 30.2* 29.9*  MCV 96.7   < > 94.7  --   --  86.0 87.5 87.8 89.3  PLT 445*   < > 339  --   --  356 309 308 290   < > = values in this interval not displayed.   Cardiac Enzymes: No results for input(s): CKTOTAL, CKMB, CKMBINDEX, TROPONINI in the last 168 hours. BNP: Invalid input(s): POCBNP CBG: No results for input(s): GLUCAP in the last 168 hours. D-Dimer No results for input(s): DDIMER in the last 72 hours. Hgb A1c No results for input(s): HGBA1C in the last 72 hours. Lipid Profile No results for input(s): CHOL, HDL, LDLCALC, TRIG, CHOLHDL, LDLDIRECT in the last 72 hours. Thyroid function studies No results for input(s): TSH, T4TOTAL, T3FREE, THYROIDAB in the last 72 hours.  Invalid input(s): FREET3 Anemia work up No results for input(s): VITAMINB12, FOLATE, FERRITIN, TIBC, IRON, RETICCTPCT in the last 72 hours. Urinalysis    Component Value Date/Time   COLORURINE YELLOW 03/14/2021 1322   APPEARANCEUR CLEAR 03/14/2021 1322   LABSPEC 1.013 03/14/2021 1322   PHURINE 5.0 03/14/2021 1322   GLUCOSEU NEGATIVE 03/14/2021 1322   HGBUR NEGATIVE 03/14/2021 1322   BILIRUBINUR NEGATIVE 03/14/2021 1322   KETONESUR NEGATIVE 03/14/2021 1322   PROTEINUR NEGATIVE 03/14/2021 1322   UROBILINOGEN 0.2 05/24/2012 1450   NITRITE NEGATIVE 03/14/2021 1322   LEUKOCYTESUR NEGATIVE 03/14/2021 1322   Sepsis Labs Invalid input(s): PROCALCITONIN,  WBC,  LACTICIDVEN Microbiology Recent Results (from the past 240 hour(s))  Resp Panel by RT-PCR (Flu A&B, Covid) Nasopharyngeal Swab     Status: None   Collection Time: 05/30/21  7:01 PM   Specimen: Nasopharyngeal Swab; Nasopharyngeal(NP) swabs in vial transport medium   Result Value Ref Range Status   SARS Coronavirus 2 by RT PCR NEGATIVE NEGATIVE Final    Comment: (NOTE) SARS-CoV-2 target nucleic acids are NOT DETECTED.  The SARS-CoV-2 RNA is generally detectable in upper respiratory specimens during the acute phase of infection. The lowest concentration of SARS-CoV-2 viral copies this assay can detect is 138 copies/mL. A negative result does not preclude SARS-Cov-2 infection and should not be used as the sole basis for treatment or other patient management decisions. A negative result may occur with  improper specimen collection/handling, submission of specimen other than nasopharyngeal swab, presence of viral mutation(s) within the areas targeted by this assay, and inadequate number of viral copies(<138 copies/mL). A negative result must be combined with clinical observations, patient history, and epidemiological information. The expected result is Negative.  Fact Sheet for Patients:  EntrepreneurPulse.com.au  Fact Sheet for Healthcare Providers:  IncredibleEmployment.be  This test is no t yet approved or cleared by the [place] and  has been authorized for detection and/or diagnosis of SARS-CoV-2 by FDA under an Emergency Use Authorization (EUA). This EUA will remain  in effect (meaning this test can be used) for the duration of the COVID-19 declaration under Section 564(b)(1) of the Act, 21 U.S.C.section 360bbb-3(b)(1), unless the authorization is terminated  or revoked sooner.       Influenza A by PCR NEGATIVE NEGATIVE Final   Influenza B by PCR NEGATIVE NEGATIVE Final    Comment: (NOTE) The Xpert Xpress SARS-CoV-2/FLU/RSV plus assay is intended as an aid in the diagnosis of influenza from Nasopharyngeal swab specimens and should not be used as a sole basis for treatment. Nasal washings and aspirates are unacceptable for Xpert Xpress SARS-CoV-2/FLU/RSV testing.  Fact Sheet for  Patients: EntrepreneurPulse.com.au  Fact Sheet for Healthcare Providers: IncredibleEmployment.be  This test is not yet approved or cleared by the [place] FDA and has been authorized for detection and/or diagnosis of SARS-CoV-2 by FDA under an Emergency Use Authorization (EUA). This EUA will remain in effect (meaning this test can be used) for the duration of the COVID-19 declaration under Section 564(b)(1) of the Act, 21 U.S.C. section 360bbb-3(b)(1), unless the authorization is terminated or revoked.  Performed at [facility], [place] [address] [place], [place] [address]      Time coordinating discharge: Over 30 minutes  SIGNED:   Phillips Climes, MD  [facility] 06/05/2021, 10:03 AM Pager   If 7PM-7AM, please contact night-coverage [email or web address] Password [email or web address]

## 2021-06-06 ENCOUNTER — Inpatient Hospital Stay: Admission: RE | Admit: 2021-06-06 | Payer: Medicare PPO | Source: Ambulatory Visit

## 2021-06-06 NOTE — Progress Notes (Signed)
Post discharge follow up done with Rollene Fare at [facility]- confirmed she has the faxed referral for [facility] needs- she will f/u to set pt up with therapy needs with [facility] and if needed with make referral to preferred [facility] if pt needs further [facility] needs.

## 2021-06-08 ENCOUNTER — Ambulatory Visit: Payer: Medicare PPO | Admitting: Pulmonary Disease

## 2021-06-09 ENCOUNTER — Encounter: Payer: Self-pay | Admitting: Pulmonary Disease

## 2021-06-09 ENCOUNTER — Other Ambulatory Visit: Payer: Self-pay

## 2021-06-09 ENCOUNTER — Ambulatory Visit: Payer: Medicare PPO | Admitting: Pulmonary Disease

## 2021-06-09 VITALS — BP 140/64 | HR 70 | Temp 98.2°F | Ht <= 58 in | Wt 87.6 lb

## 2021-06-09 DIAGNOSIS — I272 Pulmonary hypertension, unspecified: Secondary | ICD-10-CM

## 2021-06-09 DIAGNOSIS — J849 Interstitial pulmonary disease, unspecified: Secondary | ICD-10-CM | POA: Diagnosis not present

## 2021-06-09 NOTE — Patient Instructions (Signed)
I am glad you are feeling better after your recent hospitalization We will continue to observe you off blood thinners Continue medication for pulmonary hypertension Return to clinic in 3 months

## 2021-06-09 NOTE — Progress Notes (Signed)
Meghan Wise    [id number]    08-16-1935  Primary Care Physician:Hammer, Geryl Rankins, MD  Referring Physician: Carol Ada, MD [address] [place],  [place] [address]  Chief complaint: Follow-up for interstitial lung disease, pulmonary hypertension  HPI: 86 year old female smoker with history of scleroderma, pulmonary hypertension, chronic hypoxic respiratory failure on 4 L oxygen, chronic kidney disease Referred for establishing care and ongoing management of her lung disease  She previously followed with Dr. Lake Bells and now with Dr. Daine Gravel at [facility] pulmonary hypertension clinic. For pulmonary hypertension she was started on Ambrisentan and tadalafil in March 2015.  Tyvaso was attempted in June 2021 but had to stop due to side effects in 2022 On torsemide for diuresis For scleroderma she follows with Dr. Dossie Der, rheumatology.  She has been unable to tolerate CellCept and is on chronic prednisone at 5 mg.  Chronic kidney disease is managed by Dr. Myna Hidalgo and GERD is managed by Dr. Benson Norway  Seen in pulmonary clinic in early November 2022 for dyspnea, chest pain with concern for PE.  She was hospitalized with high D-dimer, intermediate VQ scan and right popliteal vein DVT.  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  No feather pillows or comforters Smoking history: 90-pack-year smoker.  Quit at age 42 Travel history: Previously lived in [place] in [place].  No significant recent travel Relevant family history: No family history of lung disease  Interim history: She had a hospitalization from 2/6-2/03/2022 with worsening dyspnea, GI bleed in the setting of Eliquis.  Her Eliquis was held and GI consulted who felt she was high risk for any procedure.  IVC filter was placed and she had persistent DVT with equivocal VQ scan.  CT high-resolution did not show any  interstitial lung disease and right heart catheterization showed actual improvement in pulmonary vascular resistance  Returns for follow-up.  She states that overall breathing is improved but continues to have chronic dyspnea on exertion  Outpatient Encounter Medications as of 06/09/2021  Medication Sig   Acetaminophen 500 MG capsule Take 1,000 mg by mouth every 6 (six) hours as needed for fever.   ambrisentan (LETAIRIS) 10 MG tablet Take 10 mg by mouth daily.    amiodarone (PACERONE) 200 MG tablet Take 1 tablet (200 mg total) by mouth 2 (two) times daily for 4 days, THEN 1 tablet (200 mg total) daily.   atorvastatin (LIPITOR) 20 MG tablet Take 20 mg by mouth daily.   Cyanocobalamin (VITAMIN B-12 ER PO) Take 1 tablet by mouth daily.   denosumab (PROLIA) 60 MG/ML SOSY injection Inject 60 mg into the skin every 6 (six) months.   ferrous sulfate 324 MG TBEC Take 324 mg by mouth daily.   folic acid (FOLVITE) 553 MCG tablet Take 400 mcg by mouth every evening.    levothyroxine (SYNTHROID, LEVOTHROID) 50 MCG tablet Take 50 mcg by mouth every morning.   loperamide (IMODIUM A-D) 2 MG tablet Take 4 mg by mouth 4 (four) times daily as needed for diarrhea or loose stools.    Melatonin 10 MG TABS Take 10 mg by mouth daily as needed (sleep).    OXYGEN Inhale 2-4 L into the lungs continuous. 4 L with exertion 2 L at night   pantoprazole (PROTONIX) 40 MG tablet Take 1 tablet (40 mg total) by mouth 2 (two) times daily.   predniSONE (DELTASONE) 5 MG  tablet Take 5 mg by mouth daily with breakfast.   tadalafil (ADCIRCA/CIALIS) 20 MG tablet Take 40 mg by mouth every evening.    torsemide (DEMADEX) 5 MG tablet Take 1 tablet (5 mg total) by mouth daily. (Patient taking differently: Take 10 mg by mouth daily.)   No facility-administered encounter medications on file as of 06/09/2021.   Physical Exam: Blood pressure 140/64, pulse 70, temperature 98.2 F (36.8 C), temperature source Oral, height _0  (1.473 m),  weight 87 lb 9.6 oz (39.7 kg), SpO2 100 %. Gen:      No acute distress HEENT:  EOMI, sclera anicteric Neck:     No masses; no thyromegaly Lungs:    Clear to auscultation bilaterally; normal respiratory effort CV:         Regular rate and rhythm; no murmurs Abd:      + bowel sounds; soft, non-tender; no palpable masses, no distension Ext:    No edema; adequate peripheral perfusion Skin:      Warm and dry; no rash Neuro: alert and oriented x 3 Psych: normal mood and affect   Data Reviewed: Imaging: HRCT 07/03/2018-biapical pleural-parenchymal scarring, emphysema.  No clear evidence of interstitial lung disease  CT chest 12/24/2020-stable lung nodules, background emphysema and scarring.  High-resolution CT 05/31/2021-cardiomegaly with findings suggestive of edema.  No definite interstitial lung disease I reviewed the images personally.  PFTs: 06/25/2018 FVC 1.82 [103%], FEV1 1.33 [104%], F/F 73, TLC 3.89 [9 9%], DLCO 5.62 [39%] Severe diffusion impairment  Labs: Labs from nephrology office dated 03/21/2021 BUN 73, creatinine 1.74, hemoglobin 10.3.   Cardiac: 9/ 2019 RHC from [facility] right atrial 8, PA pressure 72/32 mean 42 wedge pressure 14, cardiac index 3.4 L/min/m, PVR 6 Woods units  06/01/2021 Right heart catheterization 1. Moderate pulmonary arterial hypertension with PVR 4.8 WU.  2. Normal PCWP.  3. Mildly elevated RA and RVEDP.  4. Preserved cardiac output.   Echocardiogram 05/31/2021 LVEF 60 to 63%, grade 2 diastolic dysfunction, estimated RVSP 67  Assessment:  Recent admit for GI bleed She has she has history of duodenal AV malformations.  Her Eliquis has been held Hemoglobin is stable posttransfusion.  Continue PPI  We discussed EGD and she would like to avoid as she is high risk for complications due to severe pulmonary hypertension. Follows with Dr. Benson Norway, Gastroenterology  Pulmonary hypertension Maintained on tadalafil and Ambrisentan.  Follows at pulmonary  hypertension clinic at [facility].   The right heart catheterization earlier this month shows improvement in PVR and echo shows normal LV ejection fraction Continue current therapy  Scleroderma No evidence of interstitial lung disease on high-res CT Continues on 5 mg of prednisone per rheumatology  Persistent DVT, suspected PE given equivocal VQ scan Of anticoagulation due to recent GI bleed.  She has an IVC filter in place  Plan/Recommendations: Continue pulmonary hypertension medications Follow-up in 3 months  Marshell Garfinkel MD [facility] 06/09/2021, 3:08 PM  CC: Carol Ada, MD

## 2021-06-10 DIAGNOSIS — K922 Gastrointestinal hemorrhage, unspecified: Secondary | ICD-10-CM | POA: Diagnosis not present

## 2021-06-10 DIAGNOSIS — R0600 Dyspnea, unspecified: Secondary | ICD-10-CM | POA: Diagnosis not present

## 2021-06-10 DIAGNOSIS — N189 Chronic kidney disease, unspecified: Secondary | ICD-10-CM | POA: Diagnosis not present

## 2021-06-10 DIAGNOSIS — I82409 Acute embolism and thrombosis of unspecified deep veins of unspecified lower extremity: Secondary | ICD-10-CM | POA: Diagnosis not present

## 2021-06-10 DIAGNOSIS — R7401 Elevation of levels of liver transaminase levels: Secondary | ICD-10-CM | POA: Diagnosis not present

## 2021-06-10 DIAGNOSIS — I270 Primary pulmonary hypertension: Secondary | ICD-10-CM | POA: Diagnosis not present

## 2021-06-10 DIAGNOSIS — I4891 Unspecified atrial fibrillation: Secondary | ICD-10-CM | POA: Diagnosis not present

## 2021-06-10 DIAGNOSIS — D649 Anemia, unspecified: Secondary | ICD-10-CM | POA: Diagnosis not present

## 2021-06-15 ENCOUNTER — Encounter: Payer: Self-pay | Admitting: Pulmonary Disease

## 2021-06-18 DIAGNOSIS — M341 CR(E)ST syndrome: Secondary | ICD-10-CM | POA: Diagnosis not present

## 2021-06-18 DIAGNOSIS — D631 Anemia in chronic kidney disease: Secondary | ICD-10-CM | POA: Diagnosis not present

## 2021-06-18 DIAGNOSIS — D649 Anemia, unspecified: Secondary | ICD-10-CM | POA: Diagnosis not present

## 2021-06-18 DIAGNOSIS — I4891 Unspecified atrial fibrillation: Secondary | ICD-10-CM | POA: Diagnosis not present

## 2021-06-18 DIAGNOSIS — I1310 Hypertensive heart and chronic kidney disease without heart failure, with stage 1 through stage 4 chronic kidney disease, or unspecified chronic kidney disease: Secondary | ICD-10-CM | POA: Diagnosis not present

## 2021-06-18 DIAGNOSIS — I2510 Atherosclerotic heart disease of native coronary artery without angina pectoris: Secondary | ICD-10-CM | POA: Diagnosis not present

## 2021-06-18 DIAGNOSIS — E039 Hypothyroidism, unspecified: Secondary | ICD-10-CM | POA: Diagnosis not present

## 2021-06-18 DIAGNOSIS — M810 Age-related osteoporosis without current pathological fracture: Secondary | ICD-10-CM | POA: Diagnosis not present

## 2021-06-18 DIAGNOSIS — I272 Pulmonary hypertension, unspecified: Secondary | ICD-10-CM | POA: Diagnosis not present

## 2021-06-18 DIAGNOSIS — N189 Chronic kidney disease, unspecified: Secondary | ICD-10-CM | POA: Diagnosis not present

## 2021-06-25 DIAGNOSIS — D649 Anemia, unspecified: Secondary | ICD-10-CM | POA: Diagnosis not present

## 2021-06-27 DIAGNOSIS — D509 Iron deficiency anemia, unspecified: Secondary | ICD-10-CM | POA: Diagnosis not present

## 2021-06-27 DIAGNOSIS — E039 Hypothyroidism, unspecified: Secondary | ICD-10-CM | POA: Diagnosis not present

## 2021-06-27 DIAGNOSIS — I4891 Unspecified atrial fibrillation: Secondary | ICD-10-CM | POA: Diagnosis not present

## 2021-06-27 DIAGNOSIS — N189 Chronic kidney disease, unspecified: Secondary | ICD-10-CM | POA: Diagnosis not present

## 2021-06-27 DIAGNOSIS — Z79899 Other long term (current) drug therapy: Secondary | ICD-10-CM | POA: Diagnosis not present

## 2021-06-27 DIAGNOSIS — Z8719 Personal history of other diseases of the digestive system: Secondary | ICD-10-CM | POA: Diagnosis not present

## 2021-06-27 DIAGNOSIS — I270 Primary pulmonary hypertension: Secondary | ICD-10-CM | POA: Diagnosis not present

## 2021-06-27 DIAGNOSIS — I509 Heart failure, unspecified: Secondary | ICD-10-CM | POA: Diagnosis not present

## 2021-07-04 IMAGING — MG DIGITAL SCREENING BILAT W/ TOMO W/ CAD
6 of 10 series · 6 of 30 positions shown · non-contrast
Comparison: None.

CLINICAL DATA: Screening.

EXAM:
DIGITAL SCREENING BILATERAL MAMMOGRAM WITH TOMO AND CAD

[R CC synth-2D]
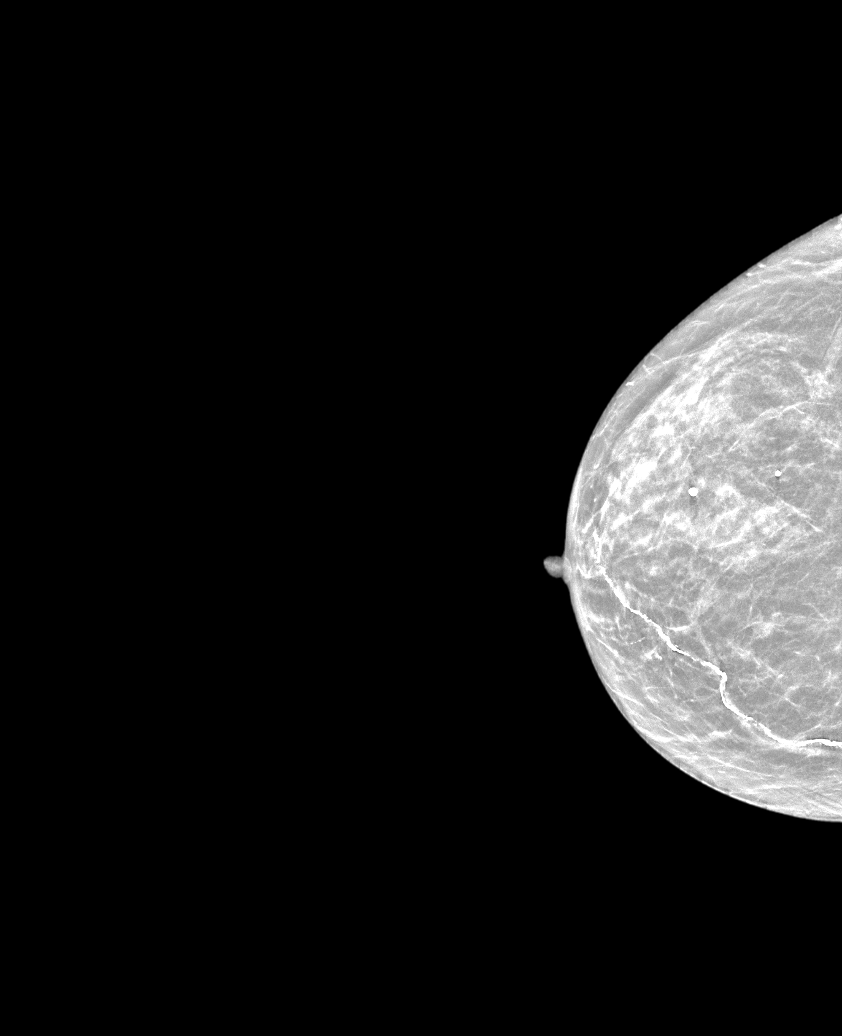

[R MLO synth-2D]
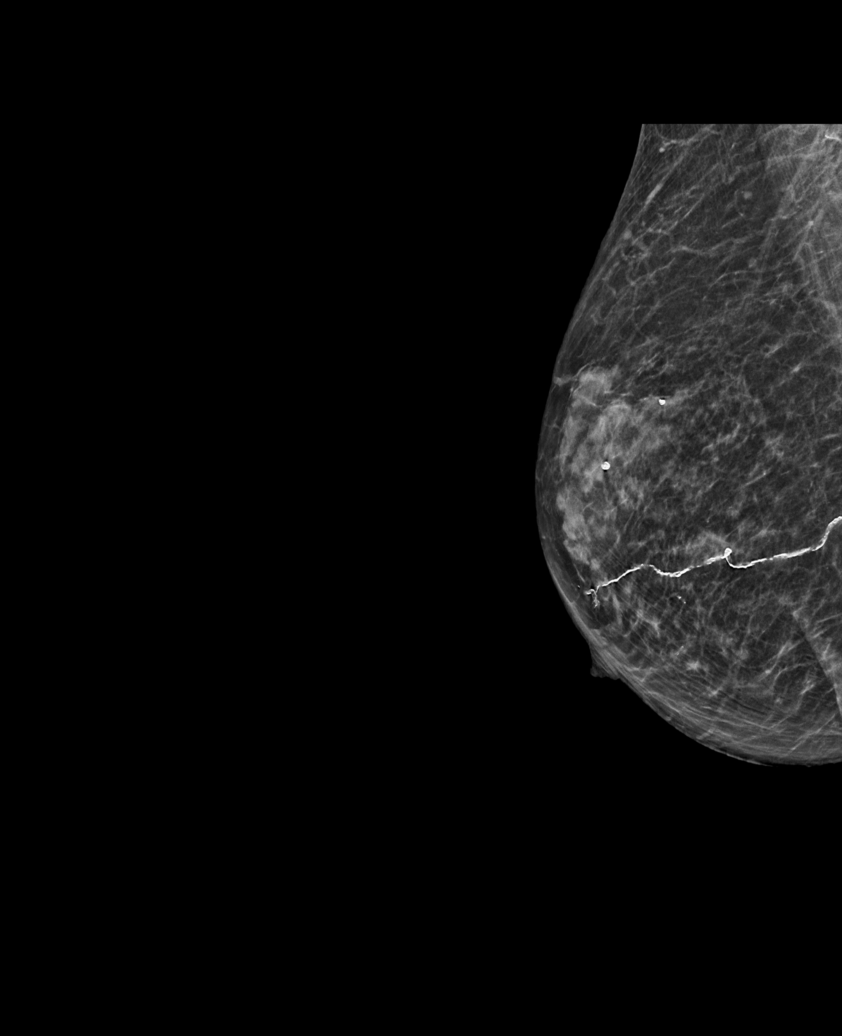

[L MLO synth-2D (1 of 2)]
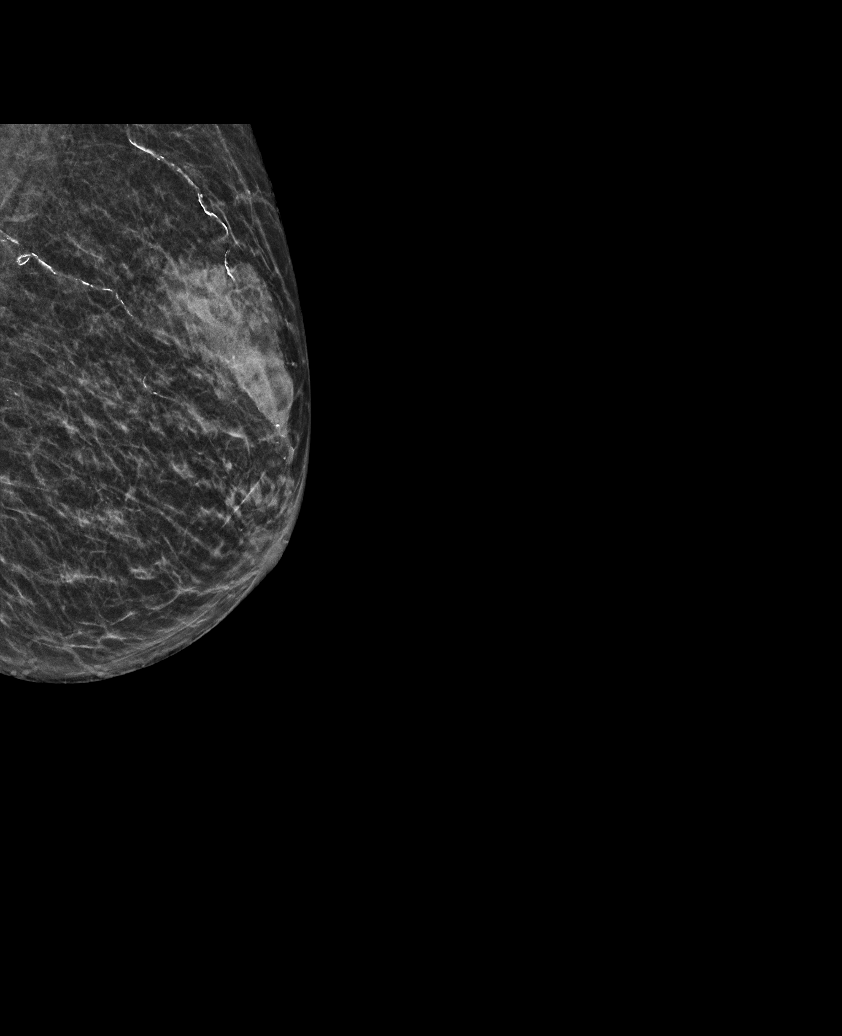

[L CC synth-2D]
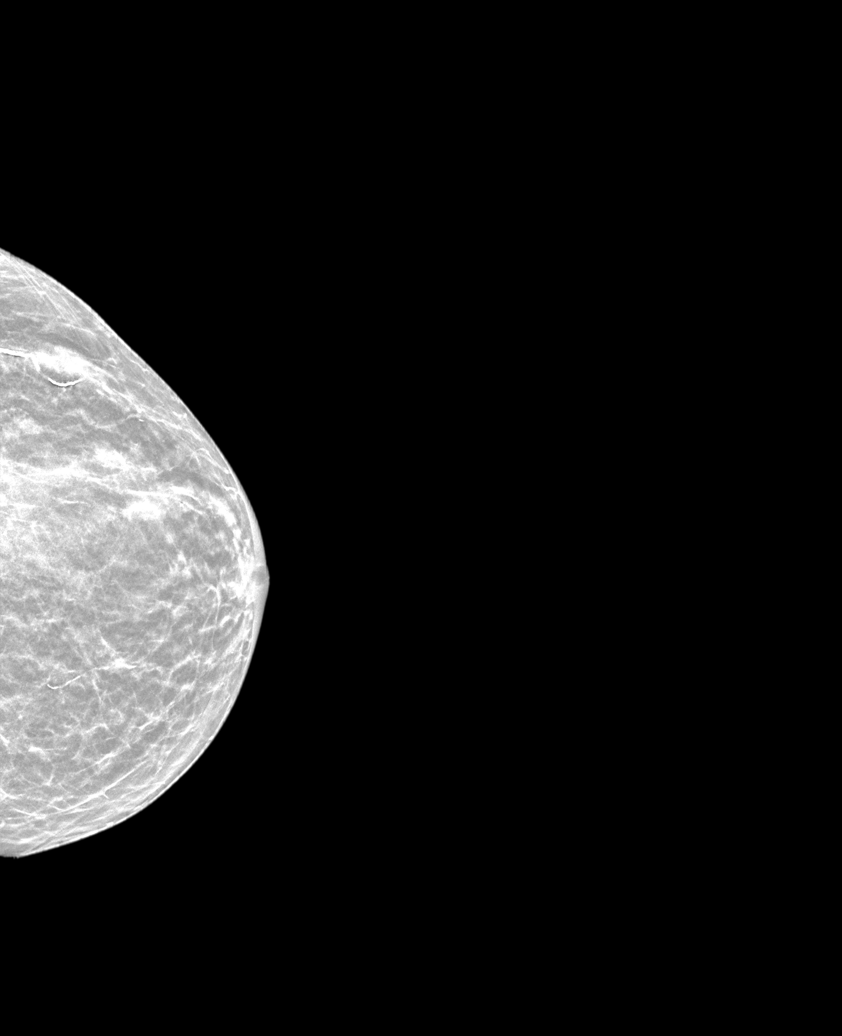

[L MLO synth-2D (2 of 2)]
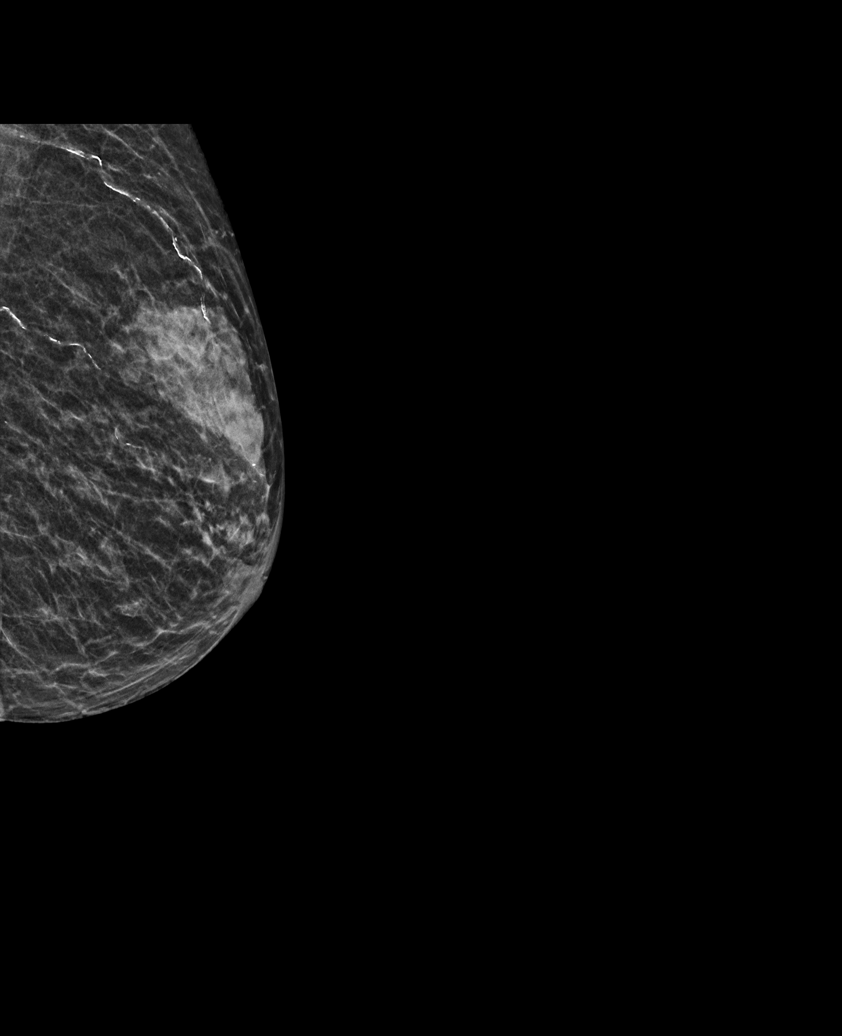

[L MLO tomo · tomo slice 21/40.0]
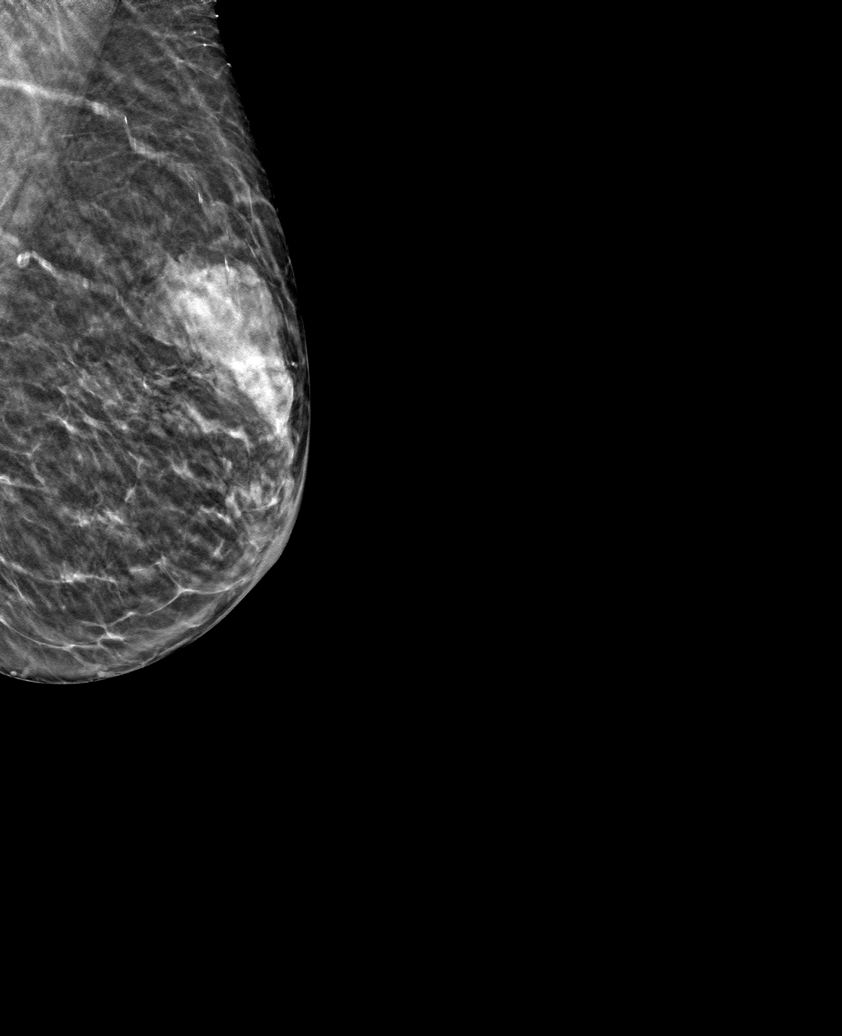

[6 of 30 positions shown; findings below may reference images not displayed]

ACR Breast Density Category b: There are scattered areas of
fibroglandular density.
FINDINGS: There are no findings suspicious for malignancy. Images were
processed with CAD.
IMPRESSION: No mammographic evidence of malignancy. A result letter of this
screening mammogram will be mailed directly to the patient.

RECOMMENDATION:
Screening mammogram in one year. (Code:[id number])

BI-RADS CATEGORY  1: Negative.

## 2021-07-15 DIAGNOSIS — N189 Chronic kidney disease, unspecified: Secondary | ICD-10-CM | POA: Diagnosis not present

## 2021-07-15 DIAGNOSIS — R011 Cardiac murmur, unspecified: Secondary | ICD-10-CM | POA: Diagnosis not present

## 2021-07-15 DIAGNOSIS — I270 Primary pulmonary hypertension: Secondary | ICD-10-CM | POA: Diagnosis not present

## 2021-07-15 DIAGNOSIS — I4891 Unspecified atrial fibrillation: Secondary | ICD-10-CM | POA: Diagnosis not present

## 2021-07-15 DIAGNOSIS — D649 Anemia, unspecified: Secondary | ICD-10-CM | POA: Diagnosis not present

## 2021-07-21 DIAGNOSIS — D649 Anemia, unspecified: Secondary | ICD-10-CM | POA: Diagnosis not present

## 2021-07-21 DIAGNOSIS — N1832 Chronic kidney disease, stage 3b: Secondary | ICD-10-CM | POA: Diagnosis not present

## 2021-07-28 DIAGNOSIS — D509 Iron deficiency anemia, unspecified: Secondary | ICD-10-CM | POA: Diagnosis not present

## 2021-07-28 DIAGNOSIS — I509 Heart failure, unspecified: Secondary | ICD-10-CM | POA: Diagnosis not present

## 2021-07-28 DIAGNOSIS — I130 Hypertensive heart and chronic kidney disease with heart failure and stage 1 through stage 4 chronic kidney disease, or unspecified chronic kidney disease: Secondary | ICD-10-CM | POA: Diagnosis not present

## 2021-07-28 DIAGNOSIS — N184 Chronic kidney disease, stage 4 (severe): Secondary | ICD-10-CM | POA: Diagnosis not present

## 2021-07-28 DIAGNOSIS — D631 Anemia in chronic kidney disease: Secondary | ICD-10-CM | POA: Diagnosis not present

## 2021-07-28 DIAGNOSIS — I2721 Secondary pulmonary arterial hypertension: Secondary | ICD-10-CM | POA: Diagnosis not present

## 2021-07-28 DIAGNOSIS — J441 Chronic obstructive pulmonary disease with (acute) exacerbation: Secondary | ICD-10-CM | POA: Diagnosis not present

## 2021-07-28 DIAGNOSIS — I4891 Unspecified atrial fibrillation: Secondary | ICD-10-CM | POA: Diagnosis not present

## 2021-07-28 DIAGNOSIS — I2510 Atherosclerotic heart disease of native coronary artery without angina pectoris: Secondary | ICD-10-CM | POA: Diagnosis not present

## 2021-08-01 DIAGNOSIS — D500 Iron deficiency anemia secondary to blood loss (chronic): Secondary | ICD-10-CM | POA: Diagnosis not present

## 2021-08-01 DIAGNOSIS — R002 Palpitations: Secondary | ICD-10-CM | POA: Diagnosis not present

## 2021-08-01 DIAGNOSIS — I501 Left ventricular failure: Secondary | ICD-10-CM | POA: Diagnosis not present

## 2021-08-01 DIAGNOSIS — N189 Chronic kidney disease, unspecified: Secondary | ICD-10-CM | POA: Diagnosis not present

## 2021-08-01 DIAGNOSIS — M25472 Effusion, left ankle: Secondary | ICD-10-CM | POA: Diagnosis not present

## 2021-08-01 DIAGNOSIS — I2729 Other secondary pulmonary hypertension: Secondary | ICD-10-CM | POA: Diagnosis not present

## 2021-08-01 DIAGNOSIS — M349 Systemic sclerosis, unspecified: Secondary | ICD-10-CM | POA: Diagnosis not present

## 2021-08-01 DIAGNOSIS — R0602 Shortness of breath: Secondary | ICD-10-CM | POA: Diagnosis not present

## 2021-08-01 DIAGNOSIS — I130 Hypertensive heart and chronic kidney disease with heart failure and stage 1 through stage 4 chronic kidney disease, or unspecified chronic kidney disease: Secondary | ICD-10-CM | POA: Diagnosis not present

## 2021-08-01 DIAGNOSIS — I272 Pulmonary hypertension, unspecified: Secondary | ICD-10-CM | POA: Diagnosis not present

## 2021-08-01 DIAGNOSIS — I4891 Unspecified atrial fibrillation: Secondary | ICD-10-CM | POA: Diagnosis not present

## 2021-08-04 DIAGNOSIS — J441 Chronic obstructive pulmonary disease with (acute) exacerbation: Secondary | ICD-10-CM | POA: Diagnosis not present

## 2021-08-11 DIAGNOSIS — J441 Chronic obstructive pulmonary disease with (acute) exacerbation: Secondary | ICD-10-CM | POA: Diagnosis not present

## 2021-08-19 DIAGNOSIS — D649 Anemia, unspecified: Secondary | ICD-10-CM | POA: Diagnosis not present

## 2021-08-19 DIAGNOSIS — N1832 Chronic kidney disease, stage 3b: Secondary | ICD-10-CM | POA: Diagnosis not present

## 2021-08-22 DIAGNOSIS — N179 Acute kidney failure, unspecified: Secondary | ICD-10-CM | POA: Diagnosis not present

## 2021-08-22 DIAGNOSIS — N1832 Chronic kidney disease, stage 3b: Secondary | ICD-10-CM | POA: Diagnosis not present

## 2021-08-22 DIAGNOSIS — I129 Hypertensive chronic kidney disease with stage 1 through stage 4 chronic kidney disease, or unspecified chronic kidney disease: Secondary | ICD-10-CM | POA: Diagnosis not present

## 2021-08-22 DIAGNOSIS — I272 Pulmonary hypertension, unspecified: Secondary | ICD-10-CM | POA: Diagnosis not present

## 2021-08-22 DIAGNOSIS — D649 Anemia, unspecified: Secondary | ICD-10-CM | POA: Diagnosis not present

## 2021-08-23 DIAGNOSIS — D631 Anemia in chronic kidney disease: Secondary | ICD-10-CM | POA: Diagnosis not present

## 2021-08-23 DIAGNOSIS — R002 Palpitations: Secondary | ICD-10-CM | POA: Diagnosis not present

## 2021-08-23 DIAGNOSIS — J441 Chronic obstructive pulmonary disease with (acute) exacerbation: Secondary | ICD-10-CM | POA: Diagnosis not present

## 2021-08-23 DIAGNOSIS — I130 Hypertensive heart and chronic kidney disease with heart failure and stage 1 through stage 4 chronic kidney disease, or unspecified chronic kidney disease: Secondary | ICD-10-CM | POA: Diagnosis not present

## 2021-08-30 ENCOUNTER — Non-Acute Institutional Stay (HOSPITAL_COMMUNITY)
Admission: RE | Admit: 2021-08-30 | Discharge: 2021-08-30 | Disposition: A | Discharge status: Home / Self Care | Payer: Medicare PPO | Source: Ambulatory Visit | Attending: Internal Medicine | Admitting: Internal Medicine

## 2021-08-30 DIAGNOSIS — D649 Anemia, unspecified: Secondary | ICD-10-CM | POA: Insufficient documentation

## 2021-08-30 DIAGNOSIS — N1832 Chronic kidney disease, stage 3b: Secondary | ICD-10-CM | POA: Diagnosis not present

## 2021-08-30 MED ORDER — SODIUM CHLORIDE 0.9 % IV SOLN: INTRAVENOUS | Status: DC | PRN

## 2021-08-30 MED ORDER — SODIUM CHLORIDE 0.9 % IV SOLN
200.0000 mg | Freq: Once | INTRAVENOUS | Status: AC
  Administered 2021-08-30: 200 mg via INTRAVENOUS
  Filled 2021-08-30: qty 10

## 2021-08-30 NOTE — Progress Notes (Signed)
PATIENT CARE CENTER NOTE ? ? ?Diagnosis:  Anemia- D64.9 ? ? ?Provider: Collene Leyden, MD ? ? ?Procedure: Venofer infusion  ? ? ?Note:  Patient received Venofer 200 mg infusion (dose 1 of 2) via PIV. Tolerated well with no adverse reaction. Observed patient for 30 minutes post infusion. Vital signs stable. Discharge instructions given. Patient to come back in 1 week for second infusion. Patient will schedule next appointment at the front desk. Alert, oriented and ambulatory at discharge.    ?

## 2021-09-09 ENCOUNTER — Encounter (HOSPITAL_COMMUNITY): Payer: Medicare PPO

## 2021-09-09 ENCOUNTER — Ambulatory Visit: Payer: Medicare PPO | Admitting: Pulmonary Disease

## 2021-09-21 ENCOUNTER — Other Ambulatory Visit: Payer: Self-pay | Admitting: Family Medicine

## 2021-09-21 ENCOUNTER — Ambulatory Visit
Admission: RE | Admit: 2021-09-21 | Discharge: 2021-09-21 | Disposition: A | Discharge status: Home / Self Care | Payer: Medicare PPO | Source: Ambulatory Visit | Attending: Family Medicine | Admitting: Family Medicine

## 2021-09-21 DIAGNOSIS — R5383 Other fatigue: Secondary | ICD-10-CM | POA: Diagnosis not present

## 2021-09-21 DIAGNOSIS — R059 Cough, unspecified: Secondary | ICD-10-CM

## 2021-09-21 DIAGNOSIS — R609 Edema, unspecified: Secondary | ICD-10-CM | POA: Diagnosis not present

## 2021-09-21 DIAGNOSIS — N189 Chronic kidney disease, unspecified: Secondary | ICD-10-CM | POA: Diagnosis not present

## 2021-09-21 DIAGNOSIS — R8290 Unspecified abnormal findings in urine: Secondary | ICD-10-CM | POA: Diagnosis not present

## 2021-09-21 DIAGNOSIS — I270 Primary pulmonary hypertension: Secondary | ICD-10-CM | POA: Diagnosis not present

## 2021-09-21 DIAGNOSIS — E039 Hypothyroidism, unspecified: Secondary | ICD-10-CM | POA: Diagnosis not present

## 2021-09-21 DIAGNOSIS — I509 Heart failure, unspecified: Secondary | ICD-10-CM | POA: Diagnosis not present

## 2021-09-22 ENCOUNTER — Encounter (HOSPITAL_COMMUNITY): Payer: Self-pay | Admitting: *Deleted

## 2021-09-22 ENCOUNTER — Emergency Department (HOSPITAL_COMMUNITY): Payer: Medicare PPO

## 2021-09-22 ENCOUNTER — Other Ambulatory Visit: Payer: Self-pay

## 2021-09-22 ENCOUNTER — Inpatient Hospital Stay (HOSPITAL_COMMUNITY)
Admission: EM | Admit: 2021-09-22 | Discharge: 2021-09-25 | DRG: 291 | Disposition: A | Discharge status: Home Health | Payer: Medicare PPO | Attending: Internal Medicine | Admitting: Internal Medicine

## 2021-09-22 DIAGNOSIS — N184 Chronic kidney disease, stage 4 (severe): Secondary | ICD-10-CM | POA: Diagnosis present

## 2021-09-22 DIAGNOSIS — E785 Hyperlipidemia, unspecified: Secondary | ICD-10-CM | POA: Diagnosis present

## 2021-09-22 DIAGNOSIS — Z823 Family history of stroke: Secondary | ICD-10-CM

## 2021-09-22 DIAGNOSIS — Z79890 Hormone replacement therapy (postmenopausal): Secondary | ICD-10-CM | POA: Diagnosis not present

## 2021-09-22 DIAGNOSIS — R0602 Shortness of breath: Secondary | ICD-10-CM | POA: Diagnosis not present

## 2021-09-22 DIAGNOSIS — Z886 Allergy status to analgesic agent status: Secondary | ICD-10-CM

## 2021-09-22 DIAGNOSIS — Z79899 Other long term (current) drug therapy: Secondary | ICD-10-CM

## 2021-09-22 DIAGNOSIS — I5033 Acute on chronic diastolic (congestive) heart failure: Secondary | ICD-10-CM | POA: Diagnosis present

## 2021-09-22 DIAGNOSIS — Z885 Allergy status to narcotic agent status: Secondary | ICD-10-CM

## 2021-09-22 DIAGNOSIS — N179 Acute kidney failure, unspecified: Secondary | ICD-10-CM | POA: Diagnosis present

## 2021-09-22 DIAGNOSIS — Z681 Body mass index (BMI) 19 or less, adult: Secondary | ICD-10-CM | POA: Diagnosis not present

## 2021-09-22 DIAGNOSIS — I482 Chronic atrial fibrillation, unspecified: Secondary | ICD-10-CM | POA: Diagnosis not present

## 2021-09-22 DIAGNOSIS — Z9981 Dependence on supplemental oxygen: Secondary | ICD-10-CM | POA: Diagnosis not present

## 2021-09-22 DIAGNOSIS — R54 Age-related physical debility: Secondary | ICD-10-CM | POA: Diagnosis present

## 2021-09-22 DIAGNOSIS — R609 Edema, unspecified: Secondary | ICD-10-CM | POA: Diagnosis not present

## 2021-09-22 DIAGNOSIS — J90 Pleural effusion, not elsewhere classified: Secondary | ICD-10-CM | POA: Diagnosis not present

## 2021-09-22 DIAGNOSIS — I509 Heart failure, unspecified: Principal | ICD-10-CM

## 2021-09-22 DIAGNOSIS — D631 Anemia in chronic kidney disease: Secondary | ICD-10-CM | POA: Diagnosis present

## 2021-09-22 DIAGNOSIS — I5032 Chronic diastolic (congestive) heart failure: Secondary | ICD-10-CM | POA: Diagnosis not present

## 2021-09-22 DIAGNOSIS — M349 Systemic sclerosis, unspecified: Secondary | ICD-10-CM | POA: Diagnosis not present

## 2021-09-22 DIAGNOSIS — E039 Hypothyroidism, unspecified: Secondary | ICD-10-CM | POA: Diagnosis present

## 2021-09-22 DIAGNOSIS — I130 Hypertensive heart and chronic kidney disease with heart failure and stage 1 through stage 4 chronic kidney disease, or unspecified chronic kidney disease: Secondary | ICD-10-CM | POA: Diagnosis not present

## 2021-09-22 DIAGNOSIS — Z66 Do not resuscitate: Secondary | ICD-10-CM | POA: Diagnosis present

## 2021-09-22 DIAGNOSIS — D649 Anemia, unspecified: Secondary | ICD-10-CM

## 2021-09-22 DIAGNOSIS — Z86718 Personal history of other venous thrombosis and embolism: Secondary | ICD-10-CM

## 2021-09-22 DIAGNOSIS — R0902 Hypoxemia: Secondary | ICD-10-CM | POA: Diagnosis not present

## 2021-09-22 DIAGNOSIS — I10 Essential (primary) hypertension: Secondary | ICD-10-CM | POA: Diagnosis present

## 2021-09-22 DIAGNOSIS — Z20822 Contact with and (suspected) exposure to covid-19: Secondary | ICD-10-CM | POA: Diagnosis present

## 2021-09-22 DIAGNOSIS — R0600 Dyspnea, unspecified: Secondary | ICD-10-CM | POA: Diagnosis not present

## 2021-09-22 DIAGNOSIS — E43 Unspecified severe protein-calorie malnutrition: Secondary | ICD-10-CM | POA: Diagnosis present

## 2021-09-22 DIAGNOSIS — Z8673 Personal history of transient ischemic attack (TIA), and cerebral infarction without residual deficits: Secondary | ICD-10-CM

## 2021-09-22 DIAGNOSIS — Z882 Allergy status to sulfonamides status: Secondary | ICD-10-CM

## 2021-09-22 DIAGNOSIS — D539 Nutritional anemia, unspecified: Secondary | ICD-10-CM | POA: Diagnosis present

## 2021-09-22 DIAGNOSIS — R0601 Orthopnea: Secondary | ICD-10-CM | POA: Diagnosis not present

## 2021-09-22 DIAGNOSIS — E46 Unspecified protein-calorie malnutrition: Secondary | ICD-10-CM | POA: Diagnosis present

## 2021-09-22 DIAGNOSIS — E611 Iron deficiency: Secondary | ICD-10-CM | POA: Diagnosis present

## 2021-09-22 DIAGNOSIS — F419 Anxiety disorder, unspecified: Secondary | ICD-10-CM | POA: Diagnosis present

## 2021-09-22 DIAGNOSIS — Z7952 Long term (current) use of systemic steroids: Secondary | ICD-10-CM

## 2021-09-22 DIAGNOSIS — I159 Secondary hypertension, unspecified: Secondary | ICD-10-CM | POA: Diagnosis not present

## 2021-09-22 DIAGNOSIS — Z95828 Presence of other vascular implants and grafts: Secondary | ICD-10-CM | POA: Diagnosis not present

## 2021-09-22 DIAGNOSIS — E872 Acidosis, unspecified: Secondary | ICD-10-CM | POA: Diagnosis not present

## 2021-09-22 DIAGNOSIS — J811 Chronic pulmonary edema: Secondary | ICD-10-CM | POA: Diagnosis not present

## 2021-09-22 DIAGNOSIS — I110 Hypertensive heart disease with heart failure: Secondary | ICD-10-CM | POA: Diagnosis not present

## 2021-09-22 DIAGNOSIS — I959 Hypotension, unspecified: Secondary | ICD-10-CM | POA: Diagnosis not present

## 2021-09-22 DIAGNOSIS — F32A Depression, unspecified: Secondary | ICD-10-CM | POA: Diagnosis present

## 2021-09-22 DIAGNOSIS — I480 Paroxysmal atrial fibrillation: Secondary | ICD-10-CM | POA: Diagnosis present

## 2021-09-22 DIAGNOSIS — E876 Hypokalemia: Secondary | ICD-10-CM | POA: Diagnosis not present

## 2021-09-22 DIAGNOSIS — Z888 Allergy status to other drugs, medicaments and biological substances status: Secondary | ICD-10-CM

## 2021-09-22 DIAGNOSIS — J9621 Acute and chronic respiratory failure with hypoxia: Secondary | ICD-10-CM | POA: Diagnosis not present

## 2021-09-22 DIAGNOSIS — Z803 Family history of malignant neoplasm of breast: Secondary | ICD-10-CM

## 2021-09-22 DIAGNOSIS — Z86711 Personal history of pulmonary embolism: Secondary | ICD-10-CM

## 2021-09-22 DIAGNOSIS — I2729 Other secondary pulmonary hypertension: Secondary | ICD-10-CM | POA: Diagnosis present

## 2021-09-22 DIAGNOSIS — M810 Age-related osteoporosis without current pathological fracture: Secondary | ICD-10-CM | POA: Diagnosis present

## 2021-09-22 DIAGNOSIS — I272 Pulmonary hypertension, unspecified: Secondary | ICD-10-CM | POA: Diagnosis present

## 2021-09-22 DIAGNOSIS — Z833 Family history of diabetes mellitus: Secondary | ICD-10-CM

## 2021-09-22 DIAGNOSIS — Z8249 Family history of ischemic heart disease and other diseases of the circulatory system: Secondary | ICD-10-CM

## 2021-09-22 DIAGNOSIS — Z87891 Personal history of nicotine dependence: Secondary | ICD-10-CM

## 2021-09-22 LAB — URINALYSIS, COMPLETE (UACMP) WITH MICROSCOPIC
Bilirubin Urine: NEGATIVE
Glucose, UA: NEGATIVE mg/dL
Hgb urine dipstick: NEGATIVE
Ketones, ur: NEGATIVE mg/dL
Leukocytes,Ua: NEGATIVE
Nitrite: NEGATIVE
Protein, ur: NEGATIVE mg/dL
Specific Gravity, Urine: 1.006 (ref 1.005–1.030)
pH: 5 (ref 5.0–8.0)

## 2021-09-22 LAB — BASIC METABOLIC PANEL
Anion gap: 9 (ref 5–15)
BUN: 57 mg/dL — ABNORMAL HIGH (ref 8–23)
CO2: 18 mmol/L — ABNORMAL LOW (ref 22–32)
Calcium: 8.7 mg/dL — ABNORMAL LOW (ref 8.9–10.3)
Chloride: 114 mmol/L — ABNORMAL HIGH (ref 98–111)
Creatinine, Ser: 3.09 mg/dL — ABNORMAL HIGH (ref 0.44–1.00)
GFR, Estimated: 14 mL/min — ABNORMAL LOW (ref >60)
Glucose, Bld: 89 mg/dL (ref 70–99)
Potassium: 4.1 mmol/L (ref 3.5–5.1)
Sodium: 141 mmol/L (ref 135–145)

## 2021-09-22 LAB — HEPATIC FUNCTION PANEL
ALT: 16 U/L (ref 0–44)
AST: 14 U/L — ABNORMAL LOW (ref 15–41)
Albumin: 3.2 g/dL — ABNORMAL LOW (ref 3.5–5.0)
Alkaline Phosphatase: 44 U/L (ref 38–126)
Bilirubin, Direct: <0.1 mg/dL (ref 0.0–0.2)
Total Bilirubin: 0.5 mg/dL (ref 0.3–1.2)
Total Protein: 5.7 g/dL — ABNORMAL LOW (ref 6.5–8.1)

## 2021-09-22 LAB — HEMOGLOBIN A1C
Hgb A1c MFr Bld: 5 % (ref 4.8–5.6)
Mean Plasma Glucose: 96.8 mg/dL

## 2021-09-22 LAB — IRON AND TIBC
Iron: 46 ug/dL (ref 28–170)
Saturation Ratios: 15 % (ref 10.4–31.8)
TIBC: 301 ug/dL (ref 250–450)
UIBC: 255 ug/dL

## 2021-09-22 LAB — CBC WITH DIFFERENTIAL/PLATELET
Abs Immature Granulocytes: 0.05 10*3/uL (ref 0.00–0.07)
Basophils Absolute: 0 10*3/uL (ref 0.0–0.1)
Basophils Relative: 0 %
Eosinophils Absolute: 0.1 10*3/uL (ref 0.0–0.5)
Eosinophils Relative: 2 %
HCT: 29.2 % — ABNORMAL LOW (ref 36.0–46.0)
Hemoglobin: 9.1 g/dL — ABNORMAL LOW (ref 12.0–15.0)
Immature Granulocytes: 1 %
Lymphocytes Relative: 12 %
Lymphs Abs: 0.9 10*3/uL (ref 0.7–4.0)
MCH: 31.2 pg (ref 26.0–34.0)
MCHC: 31.2 g/dL (ref 30.0–36.0)
MCV: 100 fL (ref 80.0–100.0)
Monocytes Absolute: 0.9 10*3/uL (ref 0.1–1.0)
Monocytes Relative: 12 %
Neutro Abs: 5.4 10*3/uL (ref 1.7–7.7)
Neutrophils Relative %: 73 %
Platelets: 217 10*3/uL (ref 150–400)
RBC: 2.92 MIL/uL — ABNORMAL LOW (ref 3.87–5.11)
RDW: 19.5 % — ABNORMAL HIGH (ref 11.5–15.5)
WBC: 7.4 10*3/uL (ref 4.0–10.5)
nRBC: 0 % (ref 0.0–0.2)

## 2021-09-22 LAB — PROTEIN / CREATININE RATIO, URINE
Creatinine, Urine: 21.53 mg/dL
Total Protein, Urine: <6 mg/dL

## 2021-09-22 LAB — GLUCOSE, RANDOM: Glucose, Bld: 114 mg/dL — ABNORMAL HIGH (ref 70–99)

## 2021-09-22 LAB — GLUCOSE, CAPILLARY
Glucose-Capillary: 43 mg/dL — CL (ref 70–99)
Glucose-Capillary: 45 mg/dL — ABNORMAL LOW (ref 70–99)
Glucose-Capillary: 83 mg/dL (ref 70–99)
Glucose-Capillary: 91 mg/dL (ref 70–99)
Glucose-Capillary: 98 mg/dL (ref 70–99)

## 2021-09-22 LAB — RESP PANEL BY RT-PCR (FLU A&B, COVID) ARPGX2
Influenza A by PCR: NEGATIVE
Influenza B by PCR: NEGATIVE
SARS Coronavirus 2 by RT PCR: NEGATIVE

## 2021-09-22 LAB — TROPONIN I (HIGH SENSITIVITY)
Troponin I (High Sensitivity): 21 ng/L — ABNORMAL HIGH (ref <18)
Troponin I (High Sensitivity): 23 ng/L — ABNORMAL HIGH (ref <18)

## 2021-09-22 LAB — FOLATE: Folate: >40 ng/mL (ref >5.9)

## 2021-09-22 LAB — BRAIN NATRIURETIC PEPTIDE: B Natriuretic Peptide: 1060.6 pg/mL — ABNORMAL HIGH (ref 0.0–100.0)

## 2021-09-22 LAB — VITAMIN B12: Vitamin B-12: 1307 pg/mL — ABNORMAL HIGH (ref 180–914)

## 2021-09-22 LAB — FERRITIN: Ferritin: 36 ng/mL (ref 11–307)

## 2021-09-22 LAB — TSH: TSH: 14.665 u[IU]/mL — ABNORMAL HIGH (ref 0.350–4.500)

## 2021-09-22 MED ORDER — HYDROXYZINE HCL 25 MG PO TABS: 25.0000 mg | ORAL_TABLET | Freq: Three times a day (TID) | ORAL | Status: DC | PRN

## 2021-09-22 MED ORDER — ENOXAPARIN SODIUM 30 MG/0.3ML IJ SOSY
30.0000 mg | PREFILLED_SYRINGE | INTRAMUSCULAR | Status: DC
  Administered 2021-09-24: 30 mg via SUBCUTANEOUS
  Filled 2021-09-22 (×3): qty 0.3

## 2021-09-22 MED ORDER — DEXTROSE 50 % IV SOLN
INTRAVENOUS | Status: AC
  Administered 2021-09-22: 50 mL
  Filled 2021-09-22: qty 50

## 2021-09-22 MED ORDER — PREDNISONE 5 MG PO TABS
5.0000 mg | ORAL_TABLET | Freq: Every day | ORAL | Status: DC
  Administered 2021-09-23 – 2021-09-25 (×3): 5 mg via ORAL
  Filled 2021-09-22 (×3): qty 1

## 2021-09-22 MED ORDER — BISACODYL 5 MG PO TBEC
5.0000 mg | DELAYED_RELEASE_TABLET | Freq: Every day | ORAL | Status: DC | PRN
  Administered 2021-09-24: 5 mg via ORAL
  Filled 2021-09-22: qty 1

## 2021-09-22 MED ORDER — PANTOPRAZOLE SODIUM 40 MG PO TBEC
40.0000 mg | DELAYED_RELEASE_TABLET | Freq: Two times a day (BID) | ORAL | Status: DC
  Administered 2021-09-22 – 2021-09-25 (×7): 40 mg via ORAL
  Filled 2021-09-22 (×7): qty 1

## 2021-09-22 MED ORDER — GLUCOSE 40 % PO GEL
2.0000 | ORAL | Status: AC
  Administered 2021-09-22: 62 g via ORAL
  Filled 2021-09-22: qty 2

## 2021-09-22 MED ORDER — ACETAMINOPHEN 325 MG PO TABS: 650.0000 mg | ORAL_TABLET | Freq: Four times a day (QID) | ORAL | Status: DC | PRN

## 2021-09-22 MED ORDER — MACITENTAN 10 MG PO TABS
10.0000 mg | ORAL_TABLET | Freq: Every day | ORAL | Status: DC
  Administered 2021-09-23 – 2021-09-25 (×3): 10 mg via ORAL
  Filled 2021-09-22 (×4): qty 1

## 2021-09-22 MED ORDER — NEPRO/CARBSTEADY PO LIQD: 237.0000 mL | Freq: Three times a day (TID) | ORAL | Status: DC | PRN

## 2021-09-22 MED ORDER — FUROSEMIDE 10 MG/ML IJ SOLN
40.0000 mg | Freq: Once | INTRAMUSCULAR | Status: AC
  Administered 2021-09-22: 40 mg via INTRAVENOUS
  Filled 2021-09-22: qty 4

## 2021-09-22 MED ORDER — HYDRALAZINE HCL 20 MG/ML IJ SOLN: 5.0000 mg | INTRAMUSCULAR | Status: DC | PRN

## 2021-09-22 MED ORDER — DOCUSATE SODIUM 283 MG RE ENEM: 1.0000 | ENEMA | RECTAL | Status: DC | PRN

## 2021-09-22 MED ORDER — ATORVASTATIN CALCIUM 10 MG PO TABS
20.0000 mg | ORAL_TABLET | Freq: Every day | ORAL | Status: DC
  Administered 2021-09-22 – 2021-09-25 (×4): 20 mg via ORAL
  Filled 2021-09-22 (×5): qty 2

## 2021-09-22 MED ORDER — ACETAMINOPHEN 650 MG RE SUPP: 650.0000 mg | Freq: Four times a day (QID) | RECTAL | Status: DC | PRN

## 2021-09-22 MED ORDER — POLYETHYLENE GLYCOL 3350 17 G PO PACK: 17.0000 g | PACK | Freq: Every day | ORAL | Status: DC | PRN

## 2021-09-22 MED ORDER — LEVOTHYROXINE SODIUM 75 MCG PO TABS
75.0000 ug | ORAL_TABLET | Freq: Every day | ORAL | Status: DC
  Administered 2021-09-22 – 2021-09-25 (×4): 75 ug via ORAL
  Filled 2021-09-22 (×4): qty 1

## 2021-09-22 MED ORDER — OXYCODONE HCL 5 MG PO TABS: 5.0000 mg | ORAL_TABLET | ORAL | Status: DC | PRN

## 2021-09-22 MED ORDER — INSULIN ASPART 100 UNIT/ML IJ SOLN: 0.0000 [IU] | Freq: Three times a day (TID) | INTRAMUSCULAR | Status: DC

## 2021-09-22 MED ORDER — CALCIUM CARBONATE ANTACID 1250 MG/5ML PO SUSP: 500.0000 mg | Freq: Four times a day (QID) | ORAL | Status: DC | PRN

## 2021-09-22 MED ORDER — ZOLPIDEM TARTRATE 5 MG PO TABS: 5.0000 mg | ORAL_TABLET | Freq: Every evening | ORAL | Status: DC | PRN

## 2021-09-22 MED ORDER — TADALAFIL 20 MG PO TABS
20.0000 mg | ORAL_TABLET | Freq: Every day | ORAL | Status: DC
  Administered 2021-09-22 – 2021-09-25 (×4): 20 mg via ORAL
  Filled 2021-09-22 (×5): qty 1

## 2021-09-22 MED ORDER — SODIUM CHLORIDE 0.9% FLUSH
3.0000 mL | Freq: Two times a day (BID) | INTRAVENOUS | Status: DC
  Administered 2021-09-22 – 2021-09-24 (×5): 3 mL via INTRAVENOUS

## 2021-09-22 MED ORDER — DOCUSATE SODIUM 100 MG PO CAPS
100.0000 mg | ORAL_CAPSULE | Freq: Two times a day (BID) | ORAL | Status: DC
  Administered 2021-09-24 (×2): 100 mg via ORAL
  Filled 2021-09-22 (×4): qty 1

## 2021-09-22 MED ORDER — DEXTROSE 50 % IV SOLN
1.0000 | Freq: Once | INTRAVENOUS | Status: AC
Start: 2021-09-22 — End: 2021-09-22

## 2021-09-22 MED ORDER — CAMPHOR-MENTHOL 0.5-0.5 % EX LOTN: 1.0000 "application " | TOPICAL_LOTION | Freq: Three times a day (TID) | CUTANEOUS | Status: DC | PRN

## 2021-09-22 MED ORDER — ONDANSETRON HCL 4 MG PO TABS: 4.0000 mg | ORAL_TABLET | Freq: Four times a day (QID) | ORAL | Status: DC | PRN

## 2021-09-22 MED ORDER — MACITENTAN 10 MG PO TABS: 10.0000 mg | ORAL_TABLET | Freq: Every day | ORAL | Status: DC

## 2021-09-22 MED ORDER — AMIODARONE HCL 200 MG PO TABS
200.0000 mg | ORAL_TABLET | Freq: Every day | ORAL | Status: DC
  Administered 2021-09-22 – 2021-09-25 (×4): 200 mg via ORAL
  Filled 2021-09-22 (×4): qty 1

## 2021-09-22 MED ORDER — ONDANSETRON HCL 4 MG/2ML IJ SOLN
4.0000 mg | Freq: Four times a day (QID) | INTRAMUSCULAR | Status: DC | PRN
  Administered 2021-09-24: 4 mg via INTRAVENOUS
  Filled 2021-09-22: qty 2

## 2021-09-22 MED ORDER — SORBITOL 70 % SOLN: 30.0000 mL | Status: DC | PRN

## 2021-09-22 MED ORDER — FUROSEMIDE 10 MG/ML IJ SOLN
40.0000 mg | Freq: Two times a day (BID) | INTRAMUSCULAR | Status: DC
  Administered 2021-09-22 – 2021-09-24 (×4): 40 mg via INTRAVENOUS
  Filled 2021-09-22 (×5): qty 4

## 2021-09-22 MED ORDER — MELATONIN 5 MG PO TABS: 10.0000 mg | ORAL_TABLET | Freq: Every day | ORAL | Status: DC | PRN

## 2021-09-22 NOTE — ED Provider Notes (Signed)
[facility] EMERGENCY DEPARTMENT Provider Note   CSN: [id number] Arrival date & time: 09/22/21  1443     History  Chief Complaint  Patient presents with   Shortness of Breath    Meghan Wise is a 86 y.o. female.  Pt is a 86 yo female with a pmhx significant for pulmonary htn, osteoporosis, htn, hypothyroidism, hyperlipidemia, chf, scleroderma, raynaud's, DVT (s/p IVC filter:  of DOACs due to a GIB), and CKD.  Pt said she has been sob for 10 days.  She is on demadex, but it was stopped 2 weeks ago due to worsening renal function.  She said her GFR was in the 20s.  Labs done at [facility] PCP.  Pt said she started taking it again on Sunday, 5/28 because she was having a lot of sob and swelling.  She does not feel like it has done anything.  She is very sob with movement and is still swollen.  CXR was done yesterday by PCP and did not show CHF.  Pt has gained 6 lbs in just a few days.      Home Medications Prior to Admission medications   Medication Sig Start Date End Date Taking? Authorizing Provider  Acetaminophen 500 MG capsule Take 1,000 mg by mouth every 6 (six) hours as needed for fever.   Yes [provider]  amiodarone (PACERONE) 200 MG tablet Take 1 tablet (200 mg total) by mouth 2 (two) times daily for 4 days, THEN 1 tablet (200 mg total) daily. Patient taking differently: 200 mg daily 06/05/21 11/26/21 Yes Elgergawy, Silver Huguenin, MD  atorvastatin (LIPITOR) 20 MG tablet Take 20 mg by mouth daily.   Yes [provider]  ciprofloxacin (CIPRO) 500 MG tablet Take 500 mg by mouth 2 (two) times daily.   Yes [provider]  Cyanocobalamin (VITAMIN B-12 PO) Take 1 tablet by mouth every other day.   Yes [provider]  denosumab (PROLIA) 60 MG/ML SOSY injection Inject 60 mg into the skin every 6 (six) months.   Yes [provider]  ferrous sulfate 324 MG TBEC Take 324 mg by mouth daily.   Yes [provider]  folic acid  (FOLVITE) 154 MCG tablet Take 400 mcg by mouth daily.   Yes [provider]  levothyroxine (SYNTHROID, LEVOTHROID) 50 MCG tablet Take 50 mcg by mouth every morning.   Yes [provider]  loperamide (IMODIUM A-D) 2 MG tablet Take 4 mg by mouth 4 (four) times daily as needed for diarrhea or loose stools.    Yes [provider]  Melatonin 10 MG TABS Take 10 mg by mouth daily as needed (sleep).    Yes [provider]  OPSUMIT 10 MG tablet Take 10 mg by mouth daily. 09/10/21  Yes [provider]  pantoprazole (PROTONIX) 40 MG tablet Take 1 tablet (40 mg total) by mouth 2 (two) times daily. 06/05/21  Yes Elgergawy, Silver Huguenin, MD  predniSONE (DELTASONE) 5 MG tablet Take 5 mg by mouth daily with breakfast. Continuously   Yes [provider]  tadalafil, PAH, (ADCIRCA) 20 MG tablet Take 20 mg by mouth daily. 09/02/21  Yes [provider]  torsemide (DEMADEX) 10 MG tablet Take 10 mg by mouth daily.   Yes [provider]  apixaban (ELIQUIS) 2.5 MG TABS tablet Take 2.5 mg by mouth 2 (two) times daily. Patient not taking: Reported on 09/22/2021    [provider]  empagliflozin (JARDIANCE) 10 MG TABS tablet  Take 10 mg by mouth daily. Patient not taking: Reported on 09/22/2021    [provider]  OXYGEN Inhale 2-4 L into the lungs continuous. 4 L with exertion 2 L at night    [provider]  torsemide (DEMADEX) 5 MG tablet Take 1 tablet (5 mg total) by mouth daily. Patient not taking: Reported on 09/22/2021 03/23/21   Marshell Garfinkel, MD      Allergies    Cozaar [losartan], Nsaids, Ace inhibitors, Sulfa antibiotics, Codeine, and Heparin    Review of Systems   Review of Systems  Respiratory:  Positive for shortness of breath.   Cardiovascular:  Positive for leg swelling.  All other systems reviewed and are negative.  Physical Exam Updated Vital Signs Temp 97.7 F (36.5 C) (Oral)   Ht _0  (1.473 m)   Wt 39.7  kg   BMI 18.29 kg/m  Physical Exam Vitals and nursing note reviewed.  Constitutional:      Appearance: She is well-developed.  HENT:     Head: Normocephalic and atraumatic.     Mouth/Throat:     Mouth: Mucous membranes are moist.     Pharynx: Oropharynx is clear.  Eyes:     Extraocular Movements: Extraocular movements intact.     Pupils: Pupils are equal, round, and reactive to light.  Cardiovascular:     Rate and Rhythm: Normal rate and regular rhythm.  Pulmonary:     Effort: Tachypnea present.     Breath sounds: Rhonchi present.  Abdominal:     General: Bowel sounds are normal.     Palpations: Abdomen is soft.  Musculoskeletal:     Cervical back: Normal range of motion and neck supple.     Right lower leg: Edema present.     Left lower leg: Edema present.  Skin:    General: Skin is warm and dry.     Capillary Refill: Capillary refill takes less than 2 seconds.  Neurological:     General: No focal deficit present.     Mental Status: She is alert and oriented to person, place, and time.  Psychiatric:        Mood and Affect: Mood normal.        Behavior: Behavior normal.    ED Results / Procedures / Treatments   Labs (all labs ordered are listed, but only abnormal results are displayed) Labs Reviewed  BASIC METABOLIC PANEL - Abnormal; Notable for the following components:      Result Value   Chloride 114 (*)    CO2 18 (*)    BUN 57 (*)    Creatinine, Ser 3.09 (*)    Calcium 8.7 (*)    GFR, Estimated 14 (*)    All other components within normal limits  CBC WITH DIFFERENTIAL/PLATELET - Abnormal; Notable for the following components:   RBC 2.92 (*)    Hemoglobin 9.1 (*)    HCT 29.2 (*)    RDW 19.5 (*)    All other components within normal limits  BRAIN NATRIURETIC PEPTIDE - Abnormal; Notable for the following components:   B Natriuretic Peptide 1,060.6 (*)    All other components within normal limits  TSH - Abnormal; Notable for the following components:   TSH  14.665 (*)    All other components within normal limits  TROPONIN I (HIGH SENSITIVITY) - Abnormal; Notable for the following components:   Troponin I (High Sensitivity) 21 (*)    All other components within normal limits  RESP PANEL  BY RT-PCR (FLU A&B, COVID) [id number]  TROPONIN I (HIGH SENSITIVITY)    EKG EKG Interpretation  Date/Time:  Thursday September 22 2021 06:34:58 EDT Ventricular Rate:  56 PR Interval:  180 QRS Duration: 91 QT Interval:  680 QTC Calculation: 657 R Axis:   44 Text Interpretation: Sinus rhythm Borderline abnrm T, anterolateral leads Prolonged QT interval now in sb Confirmed by Isla Pence [phone]) on 09/22/2021 7:17:19 AM  Radiology DG Chest 2 View  Result Date: 09/21/2021 CLINICAL DATA:  Cough and fever EXAM: CHEST - 2 VIEW COMPARISON:  Chest x-ray dated June 01, 2021 FINDINGS: Unchanged cardiac and mediastinal contours. Biapical pleural-parenchymal scarring. Background emphysema. Trace right pleural effusion. No focal consolidation. IMPRESSION: 1. Background emphysema with no new focal consolidation. 2. New trace right pleural effusion. Electronically Signed   By: Yetta Glassman M.D.   On: 09/21/2021 13:46   DG Chest Port 1 View  Result Date: 09/22/2021 CLINICAL DATA:  Shortness of breath. EXAM: PORTABLE CHEST 1 VIEW COMPARISON:  Chest x-ray from yesterday. FINDINGS: Stable cardiomediastinal silhouette with mild cardiomegaly. Progressive mild diffuse interstitial thickening. Unchanged trace right pleural effusion. No consolidation or pneumothorax. No acute osseous abnormality. IMPRESSION: 1. Progressive mild interstitial pulmonary edema. Electronically Signed   By: Titus Dubin M.D.   On: 09/22/2021 07:54    Procedures Procedures    Medications Ordered in ED Medications  furosemide (LASIX) injection 40 mg (40 mg Intravenous Given 09/22/21 0756)    ED Course/ Medical Decision Making/ A&P                           Medical Decision Making Amount and/or  Complexity of Data Reviewed Labs: ordered. Radiology: ordered.  Risk Prescription drug management. Decision regarding hospitalization.   This patient presents to the ED for concern of sob, this involves an extensive number of treatment options, and is a complaint that carries with it a high risk of complications and morbidity.  The differential diagnosis includes chf exac,    Co morbidities that complicate the patient evaluation  pulmonary htn, osteoporosis, htn, hypothyroidism, hyperlipidemia, chf, scleroderma, raynaud's, DVT (s/p IVC filter:  of DOACs due to a GIB), and CKD   Additional history obtained:  Additional history obtained from epic chart review External records from outside source obtained and reviewed including EMS report   Lab Tests:  I Ordered, and personally interpreted labs.  The pertinent results include:  cbc with hemoglobin low at 9.1 (stable for pt); bmp with worsening renal function (cr 3.09 today.  It was 1.75 in Feb).  BNP elevated at 1060.6; trop 21   Imaging Studies ordered:  I ordered imaging studies including CXR  I independently visualized and interpreted imaging which showed    IMPRESSION:  1. Progressive mild interstitial pulmonary edema.   I agree with the radiologist interpretation   Cardiac Monitoring:  The patient was maintained on a cardiac monitor.  I personally viewed and interpreted the cardiac monitored which showed an underlying rhythm of: sb   Medicines ordered and prescription drug management:  I ordered medication including lasix  for chf  Reevaluation of the patient after these medicines showed that the patient improved I have reviewed the patients home medicines and have made adjustments as needed   Test Considered:  [place] legs   Critical Interventions:  lasix   Consultations Obtained:  I requested consultation with the hospitalist (Dr. Lorin Mercy),  and discussed lab and imaging findings as well as pertinent  plan  -she will admit  Problem List / ED Course:  CHF exac:  pt taken off diuretics and has gone into CHF and peripheral edema + 6 lb weight gain in just a short time.  Cr worse than in Feb.  Pt reports GFR in the 20s at pcp office.  Now GFR 14.  Pt has diuresed after IV lasix and is feeling some better.  However, with the tenuous situation with her kidneys, I think she needs to come in to gently diurese and keep an eye on her kidneys. AKI:  due to diuretics; however, she needs them to breathe.  Reevaluation:  After the interventions noted above, I reevaluated the patient and found that they have :improved   Social Determinants of Health:  Lives at home   Dispostion:  After consideration of the diagnostic results and the patients response to treatment, I feel that the patent would benefit from admission.    CRITICAL CARE Performed by: Isla Pence   Total critical care time: 30 minutes  Critical care time was exclusive of separately billable procedures and treating other patients.  Critical care was necessary to treat or prevent imminent or life-threatening deterioration.  Critical care was time spent personally by me on the following activities: development of treatment plan with patient and/or surrogate as well as nursing, discussions with consultants, evaluation of patient's response to treatment, examination of patient, obtaining history from patient or surrogate, ordering and performing treatments and interventions, ordering and review of laboratory studies, ordering and review of radiographic studies, pulse oximetry and re-evaluation of patient's condition.         Final Clinical Impression(s) / ED Diagnoses Final diagnoses:  Acute on chronic congestive heart failure, unspecified heart failure type ([place])  Peripheral edema  AKI (acute kidney injury) [facility])    Rx / DC Orders ED Discharge Orders     None         Isla Pence, MD 09/22/21 1006

## 2021-09-22 NOTE — Progress Notes (Signed)
VASCULAR LAB    Bilateral lower extremity venous duplex has been performed.  See CV proc for preliminary results.   Terelle Dobler, RVT 09/22/2021, 9:55 AM

## 2021-09-22 NOTE — Consult Note (Signed)
[facility] Renal Consultation Note  Requesting MD: Karmen Bongo, MD Indication for Consultation:  AKI  Chief complaint: Shortness of breath  HPI:  Meghan Wise is a 86 y.o. female with a history of CKD, hypertension, CHF, hypothyroidism, osteoarthritis, pulmonary hypertension and scleroderma who presented to the hospital with shortness of breath.  She was previously on torsemide however this was recently stopped.  She started it back on Sunday of this week but did not notice a difference .  Creatinine was found to be 3.09 whereas her baseline is been closer to 1.75-2.  She follows with Dr. Moshe Cipro at [facility].  Nephrology is consulted for assistance with management of AKI on CKD.  The patient is on 4 L of oxygen at home.  Here she was given Lasix 40 mg IV once this morning and was started on Lasix 40 mg IV twice daily.  There is no urine output charted but she states that she has urinated quite a bit and feels better.  Note that she under went a lower extremity duplex ultrasound which was negative for acute DVT.  Covid and flu screen was negative.  CXR with pulmonary edema.  She states that her breathing has gotten a lot better since yesterday.  She is short of breath with exertion but nothing like as bad as it was.  She would like to go with the lower dose IV lasix if she's responding to it and I agree.  She has had some slight nausea.  Struggles with chronic diarrhea which she states is attributed to bacterial overgrowth.   She was last seen on 08/22/21 at [facility].  Note at her recent visit with Dr. Moshe Cipro her creatinine trends were noted.  She was not felt to be a dialysis candidate and also wasn't interested in ever pursuing dialysis as she didn't think that it would be worth it.  She confirms this again today.  Plans were made to stop jardiance should Cr rise.  08/30/21 - Cr 2.32 and BUN 44, eGFR 20.  Her jardiance was then stopped earlier this month and  her torsemide was transitioned to PRN basis.  Note that there was an extended goals of care discussion at the last visit which ended with "She understands that the has a poor prognosis and has come to terms with it."  Creatinine, Ser  Date/Time Value Ref Range Status  09/22/2021 07:30 AM 3.09 (H) 0.44 - 1.00 mg/dL Final  06/05/2021 12:50 AM 1.75 (H) 0.44 - 1.00 mg/dL Final  06/04/2021 01:01 AM 1.90 (H) 0.44 - 1.00 mg/dL Final  06/03/2021 12:58 AM 1.81 (H) 0.44 - 1.00 mg/dL Final  06/02/2021 04:45 AM 1.96 (H) 0.44 - 1.00 mg/dL Final  06/01/2021 01:33 AM 1.69 (H) 0.44 - 1.00 mg/dL Final  05/31/2021 02:57 AM 1.52 (H) 0.44 - 1.00 mg/dL Final  05/30/2021 10:13 PM 1.50 (H) 0.44 - 1.00 mg/dL Final  05/30/2021 07:31 PM 1.65 (H) 0.44 - 1.00 mg/dL Final  03/15/2021 03:27 AM 2.04 (H) 0.44 - 1.00 mg/dL Final  03/14/2021 03:46 AM 2.10 (H) 0.44 - 1.00 mg/dL Final  03/13/2021 03:41 AM 1.95 (H) 0.44 - 1.00 mg/dL Final  03/12/2021 04:59 AM 1.56 (H) 0.44 - 1.00 mg/dL Final  03/11/2021 02:24 PM 1.44 (H) 0.44 - 1.00 mg/dL Final  03/10/2021 12:05 PM 1.60 (H) 0.40 - 1.20 mg/dL Final  02/19/2020 03:11 PM 2.14 (H) 0.44 - 1.00 mg/dL Final  04/02/2018 12:57 PM 1.58 (H) 0.44 - 1.00 mg/dL Final  03/29/2018 12:23  PM 1.52 (H) 0.44 - 1.00 mg/dL Final  03/14/2018 11:20 AM 1.40 (H) 0.40 - 1.20 mg/dL Final  01/16/2017 09:05 AM 1.41 (H) 0.44 - 1.00 mg/dL Final  07/22/2016 07:04 AM 1.47 (H) 0.44 - 1.00 mg/dL Final  07/21/2016 12:29 PM 1.42 (H) 0.44 - 1.00 mg/dL Final  05/07/2014 10:57 AM 1.74 (H) 0.50 - 1.10 mg/dL Final  09/27/2013 11:30 AM 1.82 (H) 0.50 - 1.10 mg/dL Final  05/28/2012 04:47 AM 2.36 (H) 0.50 - 1.10 mg/dL Final  05/27/2012 06:50 AM 2.40 (H) 0.50 - 1.10 mg/dL Final  05/26/2012 05:50 AM 2.61 (H) 0.50 - 1.10 mg/dL Final  05/25/2012 05:00 AM 3.35 (H) 0.50 - 1.10 mg/dL Final  05/24/2012 11:53 AM 3.39 (H) 0.50 - 1.10 mg/dL Final     PMHx:   Past Medical History:  Diagnosis Date   Heart failure ([place])     HTN (hypertension)    Hyperlipidemia    Hypothyroidism    Kidney disease    Stage IV- Dr. Servando Salina -LOV 02-11-14   Osteoarthritis    Osteoporosis    Pulmonary hypertension ([place])    Raynaud disease    reactive with cold and stress mostly fingers.   Scleroderma ([place])    lungs and kidneys   Shortness of breath dyspnea    with exertion-in Cardiac rehab program at [facility].    Past Surgical History:  Procedure Laterality Date   BREAST BIOPSY Bilateral    x    CARDIAC CATHETERIZATION     2'15 [facility]   CHOLECYSTECTOMY     CHOLECYSTECTOMY, LAPAROSCOPIC     COLONOSCOPY WITH PROPOFOL N/A 05/07/2014   Procedure: COLONOSCOPY WITH PROPOFOL;  Surgeon: Inda Castle, MD;  Location: [facility] ENDOSCOPY;  Service: Endoscopy;  Laterality: N/A;   ESOPHAGOGASTRODUODENOSCOPY (EGD) WITH PROPOFOL N/A 05/07/2014   Procedure: ESOPHAGOGASTRODUODENOSCOPY (EGD) WITH PROPOFOL;  Surgeon: Inda Castle, MD;  Location: [facility] ENDOSCOPY;  Service: Endoscopy;  Laterality: N/A;   HOT HEMOSTASIS N/A 05/07/2014   Procedure: HOT HEMOSTASIS (ARGON PLASMA COAGULATION/BICAP);  Surgeon: Inda Castle, MD;  Location: [facility];  Service: Endoscopy;  Laterality: N/A;  deuodenal bulb   IR IVC FILTER PLMT / S&I /IMG GUID/MOD SED  06/02/2021   RIGHT HEART CATH N/A 06/01/2021   Procedure: RIGHT HEART CATH;  Surgeon: Larey Dresser, MD;  Location: [place] CV LAB;  Service: Cardiovascular;  Laterality: N/A;   SHOULDER ARTHROSCOPY W/ ROTATOR CUFF REPAIR Right     Family Hx:  Family History  Problem Relation Age of Onset   Heart disease Mother    Heart disease Father    Cancer Other        husband--esophageal   Diabetes Son    Stroke Daughter    Breast cancer Neg Hx     Social History:  reports that she quit smoking about 34 years ago. Her smoking use included cigarettes. She has a 90.00 pack-year smoking history. She has never used smokeless tobacco. She reports current alcohol use. She reports that she does not use  drugs.  Allergies:  Allergies  Allergen Reactions   Cozaar [Losartan] Other (See Comments) and Cough    High calcium count and renal problems   Nsaids     Other reaction(s): Kidney Disorder   Ace Inhibitors Other (See Comments) and Cough    Dizziness and coughing    Sulfa Antibiotics Hives   Codeine Nausea Only   Heparin Anxiety and Other (See Comments)    Pt reports that they have a sensitivity towards  heparin. Pt becomes depressed and anxious to the point of crying uncontrollably.     Medications: Prior to Admission medications   Medication Sig Start Date End Date Taking? Authorizing Provider  Acetaminophen 500 MG capsule Take 1,000 mg by mouth every 6 (six) hours as needed for fever.   Yes [provider]  amiodarone (PACERONE) 200 MG tablet Take 1 tablet (200 mg total) by mouth 2 (two) times daily for 4 days, THEN 1 tablet (200 mg total) daily. Patient taking differently: 200 mg daily 06/05/21 11/26/21 Yes Elgergawy, Silver Huguenin, MD  atorvastatin (LIPITOR) 20 MG tablet Take 20 mg by mouth daily.   Yes [provider]  ciprofloxacin (CIPRO) 500 MG tablet Take 500 mg by mouth 2 (two) times daily.   Yes [provider]  Cyanocobalamin (VITAMIN B-12 PO) Take 1 tablet by mouth every other day.   Yes [provider]  denosumab (PROLIA) 60 MG/ML SOSY injection Inject 60 mg into the skin every 6 (six) months.   Yes [provider]  ferrous sulfate 324 MG TBEC Take 324 mg by mouth daily.   Yes [provider]  folic acid (FOLVITE) 794 MCG tablet Take 400 mcg by mouth daily.   Yes [provider]  levothyroxine (SYNTHROID, LEVOTHROID) 50 MCG tablet Take 50 mcg by mouth every morning.   Yes [provider]  loperamide (IMODIUM A-D) 2 MG tablet Take 4 mg by mouth 4 (four) times daily as needed for diarrhea or loose stools.    Yes [provider]  Melatonin 10 MG TABS Take 10 mg by mouth daily as needed (sleep).    Yes  [provider]  OPSUMIT 10 MG tablet Take 10 mg by mouth daily. 09/10/21  Yes [provider]  pantoprazole (PROTONIX) 40 MG tablet Take 1 tablet (40 mg total) by mouth 2 (two) times daily. 06/05/21  Yes Elgergawy, Silver Huguenin, MD  predniSONE (DELTASONE) 5 MG tablet Take 5 mg by mouth daily with breakfast. Continuously   Yes [provider]  tadalafil, PAH, (ADCIRCA) 20 MG tablet Take 20 mg by mouth daily. 09/02/21  Yes [provider]  torsemide (DEMADEX) 10 MG tablet Take 10 mg by mouth daily.   Yes [provider]  OXYGEN Inhale 2-4 L into the lungs continuous. 4 L with exertion 2 L at night    [provider]    I have reviewed the patient's current and reported prior to admission medications.  Labs:     Latest Ref Rng & Units 09/22/2021    1:20 PM 09/22/2021    7:30 AM 06/05/2021   12:50 AM  BMP  Glucose 70 - 99 mg/dL 114   89   90    BUN 8 - 23 mg/dL  57   43    Creatinine 0.44 - 1.00 mg/dL  3.09   1.75    Sodium 135 - 145 mmol/L  141   142    Potassium 3.5 - 5.1 mmol/L  4.1   3.6    Chloride 98 - 111 mmol/L  114   110    CO2 22 - 32 mmol/L  18   22    Calcium 8.9 - 10.3 mg/dL  8.7   6.9      Urinalysis    Component Value Date/Time   COLORURINE YELLOW 03/14/2021 [place] 03/14/2021 1322   LABSPEC 1.013 03/14/2021 1322   PHURINE 5.0 03/14/2021 1322   GLUCOSEU NEGATIVE 03/14/2021  [place] 03/14/2021 [place] 03/14/2021 1322   KETONESUR NEGATIVE 03/14/2021 1322   PROTEINUR NEGATIVE 03/14/2021 1322   UROBILINOGEN 0.2 05/24/2012 1450   NITRITE NEGATIVE 03/14/2021 1322   LEUKOCYTESUR NEGATIVE 03/14/2021 1322     ROS:  Pertinent items noted in HPI and remainder of comprehensive ROS otherwise negative.   Physical Exam: Vitals:   09/22/21 1111 09/22/21 1225  BP:  (!) 133/56  Pulse:  (!) 58  Resp:  19  Temp: 98 F (36.7 C) 97.7 F (36.5 C)  SpO2:  98%     General:  elderly female in bed in NAD at rest; chronically ill appearing  HEENT: NCAT Eyes: EOMI sclera anicteric Neck: supple trachea midline Heart: S1S2 no rub Lungs: crackles bilaterally on anterior exam; unlabored at rest and on 4 liters oxygen Abdomen: soft/nt/nd Extremities: 2+ edema lower extremities  Skin: no rash on extremities exposed  Neuro: alert and oriented x 3 provides hx and follows commands; conversant  Assessment/Plan:  # AKI  - Appears secondary to cardiorenal syndrome  - Continue with diuretics to optimize cardiopulmonary status  - no urine output data to guide - strict ins/outs re-ordered  - UA and up/cr ratio - she is off of jardiance  - check post void residual bladder scan and in/out cath if retains over 300 ml urine   # CKD stage IV - baseline Cr 1.7-2.0  - note that she is a poor dialysis candidate and has also elected not to pursue dialysis should her renal function worsen - she has a son on dialysis so knows what it would entail  # Acute on Chronic hypoxic resp failure  - Feeling better s/p lasix 40 mg IV this AM - continue IV lasix to optimize volume status - She is usually on 4 liters oxygen at home   # HTN  - Controlled on current regimen    # Metabolic acidosis - setting of AKI and overload  - follow with diuresis to assess for need to add bicarb  # Macrocytic anemia  - Check iron panel, B12, and folate  Claudia Desanctis 09/22/2021, 4:28 PM

## 2021-09-22 NOTE — Progress Notes (Signed)
BS 83 will continue to monitor.

## 2021-09-22 NOTE — ED Triage Notes (Signed)
The pt is c/o sob for 10 days no pain  she uses 02 at home  she is currently on 4 liters  one of her doctors stopped her duirectic and she gained 6 pounds over night   ems started an iv

## 2021-09-22 NOTE — Progress Notes (Signed)
Pt presented from ER with blood sugar of 43. Pt is diabetic but no sugar taken in ER. Protocol placed. Dextrose gel given. Rechecked blood sugar 45. Pt is asymptomatic. MD notified stat recheck on glucose ordered. Will continue to monitor

## 2021-09-22 NOTE — Consult Note (Signed)
Cardiology Consultation:   Patient ID: [name] MRN: [id number]; DOB: 06-Oct-1935  Admit date: 09/22/2021 Date of Consult: 09/22/2021  PCP:  Collene Leyden, MD   [facility] Cardiologist:  Dr. Daine Gravel ([facility])  Patient Profile:   Meghan Wise is a 86 y.o. female with a history of pulmonary hypertension secondary to scleroderma with RV failure, chronic hypoxic respiratory failure on 4L of O2 at home, paroxysmal atrial fibrillation not on anticoagulation due to GI bleed, prior DVT/PE s/p IVC filter placement in 05/2021, hypertension, hyperlipidemia, hypothyroidism, CKD stage IV, scleroderma of lung and kidneys, and Raynaud's disease who is being seen for evaluation of CHF at the request of Dr. Lorin Mercy.  History of Present Illness:   [name] is a 86 year old female with a complex medical history as described above. She has scleroderma affecting he lungs and kidneys. As a result of this she has severe pulmonary hypertension with resultant RV failure and CKD stage IV. She is followed for her pulmonary hypertension at [facility] and is on Tadalafil and Opsumit.   Patient was admitted at [facility] in 05/2021 for acute anemia and GI bleed after being started on Eliquis for DVT and possible PE. She was transfused 1 unit of PRBCs. She was seen by GI but no EGD/colonoscopy was planned due to frail status. Eliquis was held and she had IVC filter placed on 06/02/2021. Hospitalization was complicated by new onset atrial fibrillation with RVR but she converted back to sinus rhythm after receiving Metoprolol and was then started on Amiodarone drip. She was seen by our [facility] team during that admission for severe advanced pulmonary hypertension and underwent RHC which showed moderate pulmonary arterial hypertension with PVR 4.8 WU.  Most recent Echo on 08/01/2021 showed LVEF of 55% with mild LVH and grade 2 diastolic dysfunction, normal RV function, severe pulmonary hypertension with RVSP 71 mmHg,  moderate AR, mild MR, mild PR, and moderate TR.  Last 6 minute walk test on 4/102/2023 was moderately below the normal predicted range. Outpatient monitor in 07/2021 showed underlying sinus rhythm with rare PVCs/PACs and one short run of SVT.  Patient presented to the ED today for further evaluation of shortness of breath.  Patient has chronic dyspnea with exertion due to her severe pulmonary hypertension; however, she reports worsening shortness of breath over the last 4 to 6 weeks.  She has had worsening dyspnea on exertion but it sounds like mainly her symptoms have worsened at night.  She describes orthopnea and PND.  She also reports significant lower extremity edema as well as abdominal distention and early satiety.  She weighs herself daily and states her weight is up about 6 lbs.  She denies any chest pain, palpitations, lightheadedness, dizziness, syncope. She does report she had a viral GI bug about 2 weeks ago with fever, "bad  reflux," and one episode of vomiting but this did not last long. No other recent illnesses. No cough or nasal congestion. No abnormal bleeding in urine or stools. She also states her Nephrologist stopped her Torsemide about 2 weeks ago due to worsening renal function; however, it was restarted on 09/18/2021 due to significant fluid retention.  Upon arrival to the ED, vitals stable. EKG showed sinus bradycardia, rate 56 bpm, with no acute ST/T changes. High-sensitivity troponin essentially negative at 21 >> 23. BNP elevated at 1,060. Chest x-ray showed progressive mild interstitial pulmonary edema. WBC 7.4, Hgb 9.1, Plts 217. Na 141, K 4.1, Glucose 89, BUN 57, Cr 3.09.  Total Protein 5.7, Albumin 3.2, AST 14, ALT 16, Alk Phos 44, Total Bili 0.5. TSH 14.665. She was given a dose of IV Lasix and admitted for CHF.   Past Medical History:  Diagnosis Date   Heart failure ([place])    HTN (hypertension)    Hyperlipidemia    Hypothyroidism    Kidney disease    Stage IV- Dr.  Servando Salina -LOV 02-11-14   Osteoarthritis    Osteoporosis    Pulmonary hypertension (H[facility])    Raynaud disease    reactive with cold and stress mostly fingers.   Scleroderma ([place])    lungs and kidneys   Shortness of breath dyspnea    with exertion-in Cardiac rehab program at [facility].    Past Surgical History:  Procedure Laterality Date   BREAST BIOPSY Bilateral    x    CARDIAC CATHETERIZATION     2'15 [facility]   CHOLECYSTECTOMY     CHOLECYSTECTOMY, LAPAROSCOPIC     COLONOSCOPY WITH PROPOFOL N/A 05/07/2014   Procedure: COLONOSCOPY WITH PROPOFOL;  Surgeon: Inda Castle, MD;  Location: [facility] ENDOSCOPY;  Service: Endoscopy;  Laterality: N/A;   ESOPHAGOGASTRODUODENOSCOPY (EGD) WITH PROPOFOL N/A 05/07/2014   Procedure: ESOPHAGOGASTRODUODENOSCOPY (EGD) WITH PROPOFOL;  Surgeon: Inda Castle, MD;  Location: [facility] ENDOSCOPY;  Service: Endoscopy;  Laterality: N/A;   HOT HEMOSTASIS N/A 05/07/2014   Procedure: HOT HEMOSTASIS (ARGON PLASMA COAGULATION/BICAP);  Surgeon: Inda Castle, MD;  Location: [facility];  Service: Endoscopy;  Laterality: N/A;  deuodenal bulb   IR IVC FILTER PLMT / S&I /IMG GUID/MOD SED  06/02/2021   RIGHT HEART CATH N/A 06/01/2021   Procedure: RIGHT HEART CATH;  Surgeon: Larey Dresser, MD;  Location: [facility];  Service: Cardiovascular;  Laterality: N/A;   SHOULDER ARTHROSCOPY W/ ROTATOR CUFF REPAIR Right      Home Medications:  Prior to Admission medications   Medication Sig Start Date End Date Taking? Authorizing Provider  Acetaminophen 500 MG capsule Take 1,000 mg by mouth every 6 (six) hours as needed for fever.   Yes [provider]  amiodarone (PACERONE) 200 MG tablet Take 1 tablet (200 mg total) by mouth 2 (two) times daily for 4 days, THEN 1 tablet (200 mg total) daily. Patient taking differently: 200 mg daily 06/05/21 11/26/21 Yes Elgergawy, Silver Huguenin, MD  atorvastatin (LIPITOR) 20 MG tablet Take 20 mg by mouth daily.   Yes [provider]   ciprofloxacin (CIPRO) 500 MG tablet Take 500 mg by mouth 2 (two) times daily.   Yes [provider]  Cyanocobalamin (VITAMIN B-12 PO) Take 1 tablet by mouth every other day.   Yes [provider]  denosumab (PROLIA) 60 MG/ML SOSY injection Inject 60 mg into the skin every 6 (six) months.   Yes [provider]  ferrous sulfate 324 MG TBEC Take 324 mg by mouth daily.   Yes [provider]  folic acid (FOLVITE) 177 MCG tablet Take 400 mcg by mouth daily.   Yes [provider]  levothyroxine (SYNTHROID, LEVOTHROID) 50 MCG tablet Take 50 mcg by mouth every morning.   Yes [provider]  loperamide (IMODIUM A-D) 2 MG tablet Take 4 mg by mouth 4 (four) times daily as needed for diarrhea or loose stools.    Yes [provider]  Melatonin 10 MG TABS Take 10 mg by mouth daily as needed (sleep).    Yes [provider]  OPSUMIT 10 MG tablet Take 10 mg by mouth daily.  09/10/21  Yes [provider]  pantoprazole (PROTONIX) 40 MG tablet Take 1 tablet (40 mg total) by mouth 2 (two) times daily. 06/05/21  Yes Elgergawy, Silver Huguenin, MD  predniSONE (DELTASONE) 5 MG tablet Take 5 mg by mouth daily with breakfast. Continuously   Yes [provider]  tadalafil, PAH, (ADCIRCA) 20 MG tablet Take 20 mg by mouth daily. 09/02/21  Yes [provider]  torsemide (DEMADEX) 10 MG tablet Take 10 mg by mouth daily.   Yes [provider]  OXYGEN Inhale 2-4 L into the lungs continuous. 4 L with exertion 2 L at night    [provider]    Inpatient Medications: Scheduled Meds:  amiodarone  200 mg Oral Daily   atorvastatin  20 mg Oral Daily   docusate sodium  100 mg Oral BID   [START ON 09/23/2021] enoxaparin (LOVENOX) injection  30 mg Subcutaneous Q24H   furosemide  40 mg Intravenous BID   insulin aspart  0-6 Units Subcutaneous TID WC   levothyroxine  75 mcg Oral Q0600   macitentan  10 mg Oral Daily   pantoprazole   40 mg Oral BID   [START ON 09/23/2021] predniSONE  5 mg Oral Q breakfast   sodium chloride flush  3 mL Intravenous Q12H   tadalafil  20 mg Oral Daily   Continuous Infusions:  PRN Meds: acetaminophen **OR** acetaminophen, bisacodyl, calcium carbonate (dosed in mg elemental calcium), camphor-menthol **AND** hydrOXYzine, docusate sodium, feeding supplement (NEPRO CARB STEADY), hydrALAZINE, melatonin, ondansetron **OR** ondansetron (ZOFRAN) IV, oxyCODONE, polyethylene glycol, sorbitol, zolpidem  Allergies:    Allergies  Allergen Reactions   Cozaar [Losartan] Other (See Comments) and Cough    High calcium count and renal problems   Nsaids     Other reaction(s): Kidney Disorder   Ace Inhibitors Other (See Comments) and Cough    Dizziness and coughing    Sulfa Antibiotics Hives   Codeine Nausea Only   Heparin Anxiety and Other (See Comments)    Pt reports that they have a sensitivity towards heparin. Pt becomes depressed and anxious to the point of crying uncontrollably.     Social History:   Social History   Socioeconomic History   Marital status: Widowed    Spouse name: Not on file   Number of children: 5   Years of education: Not on file   Highest education level: Not on file  Occupational History   Occupation: Retired    Comment: Library Asst.  Tobacco Use   Smoking status: Former    Packs/day: 1.50    Years: 60.00    Pack years: 90.00    Types: Cigarettes    Quit date: 04/25/1987    Years since quitting: 34.4   Smokeless tobacco: Never  Vaping Use   Vaping Use: Never used  Substance and Sexual Activity   Alcohol use: Yes    Comment: OCCASIONALLY   Drug use: No   Sexual activity: Not on file  Other Topics Concern   Not on file  Social History Narrative   Not on file   Social Determinants of Health   Financial Resource Strain: Not on file  Food Insecurity: Not on file  Transportation Needs: Not on file  Physical Activity: Not on file  Stress: Not on file   Social Connections: Not on file  Intimate Partner Violence: Not on file    Family History:   Family History  Problem Relation Age of Onset   Heart disease Mother  Heart disease Father    Cancer Other        husband--esophageal   Diabetes Son    Stroke Daughter    Breast cancer Neg Hx      ROS:  Please see the history of present illness.  Review of Systems  Constitutional:  Positive for fever (2 weeks ago). Negative for malaise/fatigue.  HENT:  Negative for congestion and sinus pain.   Respiratory:  Positive for shortness of breath.   Cardiovascular:  Positive for orthopnea, leg swelling and PND. Negative for chest pain and palpitations.  Gastrointestinal:  Positive for vomiting (one episode 2 weeks ago). Negative for blood in stool, melena and nausea.  Genitourinary:  Positive for hematuria.  Musculoskeletal:  Negative for myalgias.  Neurological:  Positive for dizziness. Negative for loss of consciousness.  Endo/Heme/Allergies:  Does not bruise/bleed easily.  Psychiatric/Behavioral:  Negative for substance abuse (remote smoking history).     Physical Exam/Data:   Vitals:   09/22/21 1045 09/22/21 1111 09/22/21 1225 09/22/21 1604  BP: 129/63  (!) 133/56 (!) 123/51  Pulse:   (!) 58 (!) 55  Resp: (!) _0 Temp:  98 F (36.7 C) 97.7 F (36.5 C) 97.7 F (36.5 C)  TempSrc:  Oral Oral Oral  SpO2:   98% 97%  Weight:      Height:        Intake/Output Summary (Last 24 hours) at 09/22/2021 1800 Last data filed at 09/22/2021 1700 Gross per 24 hour  Intake 240 ml  Output 1100 ml  Net -860 ml      09/22/2021    6:51 AM 06/09/2021    2:51 PM 05/30/2021    7:08 PM  Last 3 Weights  Weight (lbs) 87 lb 8.4 oz 87 lb 9.6 oz 88 lb 2.9 oz  Weight (kg) 39.7 kg 39.735 kg 40 kg     Body mass index is 18.29 kg/m.  General: 86 y.o. thin Caucasian female resting comfortably in no acute distress. HEENT: Normocephalic and atraumatic. Sclera clear.  Neck: Supple. JVD  elevated. Heart: RRR. Distinct S1 and S2. III/VI systolic murmur.  Radial pulses 2+ and equal bilaterally. Lungs: On 4L of O2 via nasal cannula. No increased work of breathing. Crackles noted in bilateral bases. Abdomen: Soft, distended, and non-tender to palpation.  Extremities: 2-3+ pitting edema of bilateral lower extremities.  Skin: Warm and dry. Neuro: Alert and oriented x3. No focal deficits. Psych: Normal affect. Responds appropriately.  EKG:  The EKG was personally reviewed and demonstrates:  Sinus bradycardia, rate 56 bpm, with non-specific T wave changes. Normal axis. Normal PR and QRS intervals.  Telemetry:  Telemetry was personally reviewed and demonstrates:  Sinus rhythm with rates in the 50s.  Relevant CV Studies:  Right Cardiac Catheterization 06/01/2021: 1. Moderate pulmonary arterial hypertension with PVR 4.8 WU.  2. Normal PCWP.  3. Mildly elevated RA and RVEDP.  4. Preserved cardiac output.    Continue home PH medications.  Will give 1 dose of Lasix 40 mg IV.   _______________  Echocardiogram 08/01/2021: Impressions: - NORMAL LEFT VENTRICULAR SYSTOLIC FUNCTION WITH MILD LVH  - ELEVATED LA PRESSURES WITH DIASTOLIC DYSFUNCTION  - NORMAL RIGHT VENTRICULAR SYSTOLIC FUNCTION  - VALVULAR REGURGITATION: MODERATE AR, MILD MR, MILD PR, MODERATE TR  - NO VALVULAR STENOSIS  - TRIVIAL PERICARDIAL EFFUSION  - RVSP 79mHg   Laboratory Data:  High Sensitivity Troponin:   Recent Labs  Lab 09/22/21 0730 09/22/21 0927  TROPONINIHS 21* 23*  Chemistry Recent Labs  Lab 09/22/21 0730 09/22/21 1320  NA 141  --   K 4.1  --   CL 114*  --   CO2 18*  --   GLUCOSE 89 114*  BUN 57*  --   CREATININE 3.09*  --   CALCIUM 8.7*  --   GFRNONAA 14*  --   ANIONGAP 9  --     Recent Labs  Lab 09/22/21 0730  PROT 5.7*  ALBUMIN 3.2*  AST 14*  ALT 16  ALKPHOS 44  BILITOT 0.5   Lipids No results for input(s): CHOL, TRIG, HDL, LABVLDL, LDLCALC, CHOLHDL in the last 168  hours.  Hematology Recent Labs  Lab 09/22/21 0730  WBC 7.4  RBC 2.92*  HGB 9.1*  HCT 29.2*  MCV 100.0  MCH 31.2  MCHC 31.2  RDW 19.5*  PLT 217   Thyroid  Recent Labs  Lab 09/22/21 0759  TSH 14.665*    BNP Recent Labs  Lab 09/22/21 0730  BNP 1,060.6*    DDimer No results for input(s): DDIMER in the last 168 hours.   Radiology/Studies:  DG Chest 2 View  Result Date: 09/21/2021 CLINICAL DATA:  Cough and fever EXAM: CHEST - 2 VIEW COMPARISON:  Chest x-ray dated June 01, 2021 FINDINGS: Unchanged cardiac and mediastinal contours. Biapical pleural-parenchymal scarring. Background emphysema. Trace right pleural effusion. No focal consolidation. IMPRESSION: 1. Background emphysema with no new focal consolidation. 2. New trace right pleural effusion. Electronically Signed   By: Yetta Glassman M.D.   On: 09/21/2021 13:46   DG Chest Port 1 View  Result Date: 09/22/2021 CLINICAL DATA:  Shortness of breath. EXAM: PORTABLE CHEST 1 VIEW COMPARISON:  Chest x-ray from yesterday. FINDINGS: Stable cardiomediastinal silhouette with mild cardiomegaly. Progressive mild diffuse interstitial thickening. Unchanged trace right pleural effusion. No consolidation or pneumothorax. No acute osseous abnormality. IMPRESSION: 1. Progressive mild interstitial pulmonary edema. Electronically Signed   By: Titus Dubin M.D.   On: 09/22/2021 07:54   VAS [place] LOWER EXTREMITY VENOUS (DVT) (7a-7p)  Result Date: 09/22/2021  Lower Venous DVT Study Patient Name:  EMARIE PAUL  Date of Exam:   09/22/2021 Medical Rec #: [id number]          Accession #:    [id number] Date of Birth: 01-26-1936           Patient Gender: F Patient Age:   61 years Exam Location:  [facility] Procedure:      VAS [place] LOWER EXTREMITY VENOUS (DVT) Referring Phys: JULIE HAVILAND --------------------------------------------------------------------------------  Indications: Edema, and Patient taken off fluid pill by primary care doctor,  gained 6 pounds overnight and became edematous and more short of breath.  Risk Factors: Patient on 4 liters of home O2. Limitations: Sever pitting edema in the calves. Comparison Study: Prior studies done 06/10/21 and 03/11/21 indicated age                   indeterminate DVT in the right popliteal vein. Performing Technologist: Sharion Dove RVS  Examination Guidelines: A complete evaluation includes B-mode imaging, spectral Doppler, color Doppler, and power Doppler as needed of all accessible portions of each vessel. Bilateral testing is considered an integral part of a complete examination. Limited examinations for reoccurring indications may be performed as noted. The reflux portion of the exam is performed with the patient in reverse Trendelenburg.  +--------+---------------+---------+-----------+----------------+-------------+ RIGHT   CompressibilityPhasicitySpontaneityProperties      Thrombus  Aging         +--------+---------------+---------+-----------+----------------+-------------+ CFV     Full                               pulsatile                                                                waveforms                     +--------+---------------+---------+-----------+----------------+-------------+ SFJ     Full                                                             +--------+---------------+---------+-----------+----------------+-------------+ FV Prox Full                                                             +--------+---------------+---------+-----------+----------------+-------------+ FV Mid  Full                                                             +--------+---------------+---------+-----------+----------------+-------------+ FV      Full                                                             Distal                                                                    +--------+---------------+---------+-----------+----------------+-------------+ PFV     Full                                                             +--------+---------------+---------+-----------+----------------+-------------+ POP     Partial                            pulsatile       Chronic  waveforms                     +--------+---------------+---------+-----------+----------------+-------------+ PTV     Full                                                             +--------+---------------+---------+-----------+----------------+-------------+ PERO    Full                                                             +--------+---------------+---------+-----------+----------------+-------------+   +--------+---------------+---------+-----------+----------------+-------------+ LEFT    CompressibilityPhasicitySpontaneityProperties      Thrombus                                                                 Aging         +--------+---------------+---------+-----------+----------------+-------------+ CFV     Full                               pulsatile                                                                waveforms                     +--------+---------------+---------+-----------+----------------+-------------+ SFJ     Full                                                             +--------+---------------+---------+-----------+----------------+-------------+ FV Prox Full                                                             +--------+---------------+---------+-----------+----------------+-------------+ FV Mid  Full                                                             +--------+---------------+---------+-----------+----------------+-------------+ FV      Full  Distal                                                                    +--------+---------------+---------+-----------+----------------+-------------+ PFV     Full                                                             +--------+---------------+---------+-----------+----------------+-------------+ POP     Full                               pulsatile                                                                waveforms                     +--------+---------------+---------+-----------+----------------+-------------+ PTV     Full                                                             +--------+---------------+---------+-----------+----------------+-------------+ PERO    Full                                                             +--------+---------------+---------+-----------+----------------+-------------+     Summary: BILATERAL: - No evidence of deep vein thrombosis seen in the lower extremities, bilaterally. -No evidence of popliteal cyst, bilaterally. RIGHT: - Findings appear improved from previous examination. pulsatile waveforms suggestive of fluid overload  LEFT: Pulsatile waveforms suggestive of fluid overload.  *See table(s) above for measurements and observations. Electronically signed by Orlie Pollen on 09/22/2021 at 5:19:40 PM.    Final      Assessment and Plan:   Severe Pulmonary Hypertension Chronic Hypoxic Respiratory Failure RV Failure History of severe pulmonary hypertension secondary to scleroderma. Followed at [facility] and on Tadalafil and Opsumit. Last RHC in 05/2021 showed RHC which showed moderate pulmonary arterial hypertension with PVR 4.8 WU. She now presents with 4-6 weeks of progressive shortness of breath, orthopnea, PND, abdominal distention, and early satiety. BNP elevated at 1,060. Chest x-ray showed progressive mild interstitial pulmonary edema. - Volume overloaded on exam. - Will defer diuresis to Nephrology given renal function.  Currently has IV Lasix 90m twice daily ordered. - Continue home Tadalafil and Opsumit. - Recommend compressions stockings for lower extremity edema.  Paroxysmal Atrial Fibrillation Diagnosed in 05/2021 during admission for GI bleed. Recent monitor showed no recurrence. - Maintaining sinus rhythm with  rates in the 50s. - Continue Amiodarone 228m daily. - Eliquis was held during admission in 05/2021 due to active GI bleed. However, discharge summary from that hospitalization mentions that Eliquis could be resumed if hemoglobin remained stable. Hemoglobin stable at 9.1. Could consider restarting Eliquis but she has had not recurrence. [facility]  Hypertension BP well controlled. - Continue diuresis as above.  Hyperlipidemia - Continue home Lipitor 218mdaily.  Acute on CKD Stage IV Creatinine 3.09 on admission. Baseline creatinine 1.7 to 1.9. Felt to be secondary to cardiorenal syndrome.  - Nephrology following. Patient is felt to be a poor dialysis candidate and has also elected not to pursue dialysis should renal function worsens.   History of DVT/PE Previously on Eliquis but this was stopped in 05/2021 in setting of GI bleed. Lower extremity ultrasound this admission negative for DVT.  Otherwise, per primary team: - Macrocytic anemia - Metabolic acidosis  - Scleroderma - Malnutrition  Risk Assessment/Risk Scores:   New York Heart Association (NYHA) Functional Class NYHA Class IV  CHA2DS2-VASc Score = 7  This indicates a 11.2% annual risk of stroke. The patient's score is based upon: CHF History: 1 HTN History: 1 Diabetes History: 0 Stroke History: 2 (VTE history - prior DVT/PE) Vascular Disease History: 0 Age Score: 2 Gender Score: 1   For questions or updates, please contact [facility] Please consult [email or web address] for contact info under    Signed, CaDarreld McleanPA-C  09/22/2021 6:00 PM

## 2021-09-22 NOTE — H&P (Signed)
History and Physical    Patient: Meghan Wise LRJ:[id number] DOB: 08-25-1935 DOA: 09/22/2021 DOS: the patient was seen and examined on 09/22/2021 PCP: Collene Leyden, MD  Patient coming from: Home - lives alone; NOK: Daughter, [name], [phone]   Chief Complaint: SOB  HPI: Meghan Wise is a 86 y.o. female with medical history significant of HTN; HLD; DVT (02/2021) s/p IVC filter placement; afib on Eliquis; hypothyroidism; pulmonary HTN on 4-5L home O2; stage 4 CKD; and scleroderma presenting with SOB.  She was last admitted from 2/6-12 for acute on chronic respiratory failure with worsened anemia in the setting of acute GI bleeding on Eliquis.  She reports that she awoke about 1230 AM with severe SOB.  Marked LE edema and abdominal edema, has been there for several months.  No CP.  Mild cough, productive of white sputum.  She usually wears 4L home O2.  She has torsemide stopped 1-2 weeks by nephrology.  She was having a lot of swelling and SOB and she resumed it Sunday - but without any apparent effect.    ER Course:  h/o pulm HTN/CHF on 4L and CKD.  Saw PCP about 2 weeks ago, renal function was worse (GFR in 20s), stopped torsemide.  She started it back Sunday and doesn't feel like it has helped.  No edema on CXR yesterday.  Has significant LE edema and edema today on CXR.  Creatinine up to 3, GFR 14.     Review of Systems: As mentioned in the history of present illness. All other systems reviewed and are negative. Past Medical History:  Diagnosis Date   Heart failure ([place])    HTN (hypertension)    Hyperlipidemia    Hypothyroidism    Kidney disease    Stage IV- Dr. Servando Salina -LOV 02-11-14   Osteoarthritis    Osteoporosis    Pulmonary hypertension (HCC)    Raynaud disease    reactive with cold and stress mostly fingers.   Scleroderma ([place])    lungs and kidneys   Shortness of breath dyspnea    with exertion-in Cardiac rehab program at [facility].   Past Surgical History:   Procedure Laterality Date   BREAST BIOPSY Bilateral    x    CARDIAC CATHETERIZATION     2'15 [facility]   CHOLECYSTECTOMY     CHOLECYSTECTOMY, LAPAROSCOPIC     COLONOSCOPY WITH PROPOFOL N/A 05/07/2014   Procedure: COLONOSCOPY WITH PROPOFOL;  Surgeon: Inda Castle, MD;  Location: [facility];  Service: Endoscopy;  Laterality: N/A;   ESOPHAGOGASTRODUODENOSCOPY (EGD) WITH PROPOFOL N/A 05/07/2014   Procedure: ESOPHAGOGASTRODUODENOSCOPY (EGD) WITH PROPOFOL;  Surgeon: Inda Castle, MD;  Location: [facility];  Service: Endoscopy;  Laterality: N/A;   HOT HEMOSTASIS N/A 05/07/2014   Procedure: HOT HEMOSTASIS (ARGON PLASMA COAGULATION/BICAP);  Surgeon: Inda Castle, MD;  Location: [facility];  Service: Endoscopy;  Laterality: N/A;  deuodenal bulb   IR IVC FILTER PLMT / S&I /IMG GUID/MOD SED  06/02/2021   RIGHT HEART CATH N/A 06/01/2021   Procedure: RIGHT HEART CATH;  Surgeon: Larey Dresser, MD;  Location: [place] [facility];  Service: Cardiovascular;  Laterality: N/A;   SHOULDER ARTHROSCOPY W/ ROTATOR CUFF REPAIR Right    Social History:  reports that she quit smoking about 34 years ago. Her smoking use included cigarettes. She has a 90.00 pack-year smoking history. She has never used smokeless tobacco. She reports current alcohol use. She reports that she does not use drugs.  Allergies  Allergen Reactions  Cozaar [Losartan] Other (See Comments) and Cough    High calcium count and renal problems   Nsaids     Other reaction(s): Kidney Disorder   Ace Inhibitors Other (See Comments) and Cough    Dizziness and coughing    Sulfa Antibiotics Hives   Codeine Nausea Only   Heparin Anxiety and Other (See Comments)    Pt reports that they have a sensitivity towards heparin. Pt becomes depressed and anxious to the point of crying uncontrollably.     Family History  Problem Relation Age of Onset   Heart disease Mother    Heart disease Father    Cancer Other        husband--esophageal    Diabetes Son    Stroke Daughter    Breast cancer Neg Hx     Prior to Admission medications   Medication Sig Start Date End Date Taking? Authorizing Provider  Acetaminophen 500 MG capsule Take 1,000 mg by mouth every 6 (six) hours as needed for fever.    [provider]  ambrisentan (LETAIRIS) 10 MG tablet Take 10 mg by mouth daily.     [provider]  amiodarone (PACERONE) 200 MG tablet Take 1 tablet (200 mg total) by mouth 2 (two) times daily for 4 days, THEN 1 tablet (200 mg total) daily. 06/05/21 07/09/21  Elgergawy, Silver Huguenin, MD  atorvastatin (LIPITOR) 20 MG tablet Take 20 mg by mouth daily.    [provider]  Cyanocobalamin (VITAMIN B-12 ER PO) Take 1 tablet by mouth daily.    [provider]  denosumab (PROLIA) 60 MG/ML SOSY injection Inject 60 mg into the skin every 6 (six) months.    [provider]  ferrous sulfate 324 MG TBEC Take 324 mg by mouth daily.    [provider]  folic acid (FOLVITE) 500 MCG tablet Take 400 mcg by mouth every evening.     [provider]  levothyroxine (SYNTHROID, LEVOTHROID) 50 MCG tablet Take 50 mcg by mouth every morning.    [provider]  loperamide (IMODIUM A-D) 2 MG tablet Take 4 mg by mouth 4 (four) times daily as needed for diarrhea or loose stools.     [provider]  Melatonin 10 MG TABS Take 10 mg by mouth daily as needed (sleep).     [provider]  OXYGEN Inhale 2-4 L into the lungs continuous. 4 L with exertion 2 L at night    [provider]  pantoprazole (PROTONIX) 40 MG tablet Take 1 tablet (40 mg total) by mouth 2 (two) times daily. 06/05/21   Elgergawy, Silver Huguenin, MD  predniSONE (DELTASONE) 5 MG tablet Take 5 mg by mouth daily with breakfast.    [provider]  tadalafil (ADCIRCA/CIALIS) 20 MG tablet Take 40 mg by mouth every evening.     [provider]  torsemide (DEMADEX) 5 MG tablet Take 1 tablet (5 mg total) by  mouth daily. Patient taking differently: Take 10 mg by mouth daily. 03/23/21   Marshell Garfinkel, MD    Physical Exam: Vitals:   09/22/21 1015 09/22/21 1045 09/22/21 1111 09/22/21 1225  BP: (!) 126/48 129/63  (!) 133/56  Pulse:    (!) 58  Resp: 20 (!) 21  19  Temp:   98 F (36.7 C) 97.7 F (36.5 C)  TempSrc:   Oral Oral  SpO2:    98%  Weight:      Height:       General:  Appears calm and comfortable and is in NAD; very thin/frail chest region Eyes:  PERRL, EOMI, normal lids, iris ENT:  grossly normal hearing, lips & tongue, mmm Neck:  no LAD, masses or thyromegaly Cardiovascular:  RRR, no r/g. Loud 4/6 murmur along RUSB. 3+ LE edema.  Respiratory:   CTA bilaterally with no wheezes/rales/rhonchi, scant L basilar crackles.  Normal respiratory effort. Abdomen:  soft, NT, ND, +abdominal wall edema Skin:  no rash or induration seen on limited exam Musculoskeletal:  grossly normal tone BUE/BLE, good ROM, no bony abnormality Psychiatric:  grossly normal mood and affect, speech fluent and appropriate, AOx3 Neurologic:  CN 2-12 grossly intact, moves all extremities in coordinated fashion, sensation intact   Radiological Exams on Admission: Independently reviewed - see discussion in A/P where applicable  DG Chest 2 View  Result Date: 09/21/2021 CLINICAL DATA:  Cough and fever EXAM: CHEST - 2 VIEW COMPARISON:  Chest x-ray dated June 01, 2021 FINDINGS: Unchanged cardiac and mediastinal contours. Biapical pleural-parenchymal scarring. Background emphysema. Trace right pleural effusion. No focal consolidation. IMPRESSION: 1. Background emphysema with no new focal consolidation. 2. New trace right pleural effusion. Electronically Signed   By: Yetta Glassman M.D.   On: 09/21/2021 13:46   DG Chest Port 1 View  Result Date: 09/22/2021 CLINICAL DATA:  Shortness of breath. EXAM: PORTABLE CHEST 1 VIEW COMPARISON:  Chest x-ray from yesterday. FINDINGS: Stable cardiomediastinal silhouette with  mild cardiomegaly. Progressive mild diffuse interstitial thickening. Unchanged trace right pleural effusion. No consolidation or pneumothorax. No acute osseous abnormality. IMPRESSION: 1. Progressive mild interstitial pulmonary edema. Electronically Signed   By: Titus Dubin M.D.   On: 09/22/2021 07:54   VAS [place] LOWER EXTREMITY VENOUS (DVT) (7a-7p)  Result Date: 09/22/2021  Lower Venous DVT Study Patient Name:  CHARM STENNER  Date of Exam:   09/22/2021 Medical Rec #: [id number]          Accession #:    [id number] Date of Birth: 03/30/1936           Patient Gender: F Patient Age:   12 years Exam Location:  [facility] Procedure:      VAS [place] LOWER EXTREMITY VENOUS (DVT) Referring Phys: JULIE HAVILAND --------------------------------------------------------------------------------  Indications: Edema, and Patient taken off fluid pill by primary care doctor, gained 6 pounds overnight and became edematous and more short of breath.  Risk Factors: Patient on 4 liters of home O2. Limitations: Sever pitting edema in the calves. Comparison Study: Prior studies done 06/10/21 and 03/11/21 indicated age                   indeterminate DVT in the right popliteal vein. Performing Technologist: Sharion Dove RVS  Examination Guidelines: A complete evaluation includes B-mode imaging, spectral Doppler, color Doppler, and power Doppler as needed of all accessible portions of each vessel. Bilateral testing is considered an integral part of a complete examination. Limited examinations for reoccurring indications may be performed as noted. The reflux portion of the exam is performed with the patient in reverse Trendelenburg.  +--------+---------------+---------+-----------+----------------+-------------+ RIGHT   CompressibilityPhasicitySpontaneityProperties      Thrombus                                                                 Aging          +--------+---------------+---------+-----------+----------------+-------------+  CFV     Full                               pulsatile                                                                waveforms                     +--------+---------------+---------+-----------+----------------+-------------+ SFJ     Full                                                             +--------+---------------+---------+-----------+----------------+-------------+ FV Prox Full                                                             +--------+---------------+---------+-----------+----------------+-------------+ FV Mid  Full                                                             +--------+---------------+---------+-----------+----------------+-------------+ FV      Full                                                             Distal                                                                   +--------+---------------+---------+-----------+----------------+-------------+ PFV     Full                                                             +--------+---------------+---------+-----------+----------------+-------------+ POP     Partial                            pulsatile       Chronic  waveforms                     +--------+---------------+---------+-----------+----------------+-------------+ PTV     Full                                                             +--------+---------------+---------+-----------+----------------+-------------+ PERO    Full                                                             +--------+---------------+---------+-----------+----------------+-------------+   +--------+---------------+---------+-----------+----------------+-------------+ LEFT    CompressibilityPhasicitySpontaneityProperties      Thrombus                                                                  Aging         +--------+---------------+---------+-----------+----------------+-------------+ CFV     Full                               pulsatile                                                                waveforms                     +--------+---------------+---------+-----------+----------------+-------------+ SFJ     Full                                                             +--------+---------------+---------+-----------+----------------+-------------+ FV Prox Full                                                             +--------+---------------+---------+-----------+----------------+-------------+ FV Mid  Full                                                             +--------+---------------+---------+-----------+----------------+-------------+ FV      Full  Distal                                                                   +--------+---------------+---------+-----------+----------------+-------------+ PFV     Full                                                             +--------+---------------+---------+-----------+----------------+-------------+ POP     Full                               pulsatile                                                                waveforms                     +--------+---------------+---------+-----------+----------------+-------------+ PTV     Full                                                             +--------+---------------+---------+-----------+----------------+-------------+ PERO    Full                                                             +--------+---------------+---------+-----------+----------------+-------------+     Summary: BILATERAL: - No evidence of deep vein thrombosis seen in the lower extremities, bilaterally. -No evidence of popliteal cyst,  bilaterally. RIGHT: - Findings appear improved from previous examination. pulsatile waveforms suggestive of fluid overload  LEFT: Pulsatile waveforms suggestive of fluid overload.  *See table(s) above for measurements and observations.    Preliminary     EKG: Independently reviewed.  Sinus bradycardia with rate 56; prolonged QTc 657; nonspecific ST changes with no evidence of acute ischemia   Labs on Admission: I have personally reviewed the available labs and imaging studies at the time of the admission.  Pertinent labs:    CO2 18 BUN 57/Creatinine 3.09/GFR 14; 43/1.75/29 on 2/12 BNP 1060.6 HS troponin 21 WBC 7.4 Hgb 9.1 - stable TSH 14.665   Assessment and Plan: Principal Problem:   Cardiorenal syndrome with renal failure, stage 1-4 or unspecified chronic kidney disease, with heart failure (HCC) Active Problems:   CKD (chronic kidney disease) stage 4, GFR 15-29 ml/min (HCC)   HTN (hypertension)   Hypothyroidism   Chronic diastolic CHF (congestive heart failure), NYHA class 4 (HCC)   Scleroderma (HCC)   Pulmonary hypertension (HCC)   History of venous thromboembolism   Atrial  fibrillation, chronic (HCC)   DNR (do not resuscitate)   Protein calorie malnutrition ([place])    Cardiorenal syndrome - acute on chronic diastolic CHF -Patient with anasarca  -Echo was performed on 05/31/21 with preserved EF and showed grade 2 diastolic dysfunction -She was taken off torsemide several weeks ago and now appears to be grossly overloaded despite restarting it a few days ago -Give 60 mg IV Lasix in the ER -Cardiology consult pending -Will admit to telemetry for careful diuresis   Cardiorenal syndrome - stage IV CKD -Patient's renal dysfunction appears to be significantly worse than baseline -It may be difficult to find an effective balance with her CHF and advanced renal failure -Review of labs indicate prior elevated creatinine with stage 4 CKD, currently worse -Nephrology consult  pending -She acknowledges that she is a poor HD candidate and that previously she and her nephrologist decided that she would not pursue HD  Pulmonary HTN -Chronically on 4L-5 [place] O2 -Her respiratory status is currently worse given her volume overload -Anticipate improvement with diuresis but this issue also complicates her overall clinical picture -Continue Opsumit and tadalafil -Continue prednisone  Afib -Continue Amiodarone -Eliquis was held during prior hospitalization and has not been resumed    HLD -Continue Lipitor   HTN -Continue Norvasc, Coreg   Hypothyroidism -TSH was elevated today -She reports that she has not had a dosage adjustment in many years -Will increase Synthroid from 50 to 75 mcg daily -Needs f/u TSH in 4-6 weeks   H/o DVT -Previously on Eliquis but this was stopped in the setting of recent GI bleeding and has not been restarted -s/p IVC filter placement on 2/9   Recent GI bleed -Eliquis was held in this setting -No current c/o bleeding -However, she did start Cipro over the weekend for "bacterial overgrowth" in the colon -Will hold Cipro for now  Scleroderma -on denosumab as an outpatient  Malnutrition -Body mass index is 18.29 kg/m..  -The patient has at least 2 indicators for malnutrition (loss of muscle mass, loss of subcutaneous fat, edema -This is likely due to chronic disease  -Will obtain a nutrition consult for further recommendations.   DNR -I have discussed code status with the patient and she would not desire resuscitation and would prefer to die a natural death should that situation arise. -She will need a gold out of facility DNR form at the time of discharge    Advance Care Planning:   Code Status: DNR   Consults: Nephrology; cardiology; CHF navigator; [facility] team; nutrition; PT/OT  DVT Prophylaxis: Lovenox  Family Communication: None present; she declined to have my call family at the time of admission  Severity of  Illness: The appropriate patient status for this patient is INPATIENT. Inpatient status is judged to be reasonable and necessary in order to provide the required intensity of service to ensure the patient's safety. The patient's presenting symptoms, physical exam findings, and initial radiographic and laboratory data in the context of their chronic comorbidities is felt to place them at high risk for further clinical deterioration. Furthermore, it is not anticipated that the patient will be medically stable for discharge from the hospital within 2 midnights of admission.   * I certify that at the point of admission it is my clinical judgment that the patient will require inpatient hospital care spanning beyond 2 midnights from the point of admission due to high intensity of service, high risk for further deterioration and high frequency of surveillance required.*  Author:  Karmen Bongo, MD 09/22/2021 3:02 PM  For on call review [email or web address].

## 2021-09-23 DIAGNOSIS — I130 Hypertensive heart and chronic kidney disease with heart failure and stage 1 through stage 4 chronic kidney disease, or unspecified chronic kidney disease: Secondary | ICD-10-CM | POA: Diagnosis not present

## 2021-09-23 DIAGNOSIS — E43 Unspecified severe protein-calorie malnutrition: Secondary | ICD-10-CM | POA: Insufficient documentation

## 2021-09-23 LAB — BASIC METABOLIC PANEL
Anion gap: 8 (ref 5–15)
BUN: 52 mg/dL — ABNORMAL HIGH (ref 8–23)
CO2: 22 mmol/L (ref 22–32)
Calcium: 8.6 mg/dL — ABNORMAL LOW (ref 8.9–10.3)
Chloride: 110 mmol/L (ref 98–111)
Creatinine, Ser: 2.97 mg/dL — ABNORMAL HIGH (ref 0.44–1.00)
GFR, Estimated: 15 mL/min — ABNORMAL LOW (ref >60)
Glucose, Bld: 81 mg/dL (ref 70–99)
Potassium: 3.6 mmol/L (ref 3.5–5.1)
Sodium: 140 mmol/L (ref 135–145)

## 2021-09-23 LAB — GLUCOSE, CAPILLARY
Glucose-Capillary: 80 mg/dL (ref 70–99)
Glucose-Capillary: 94 mg/dL (ref 70–99)
Glucose-Capillary: 130 mg/dL — ABNORMAL HIGH (ref 70–99)
Glucose-Capillary: 149 mg/dL — ABNORMAL HIGH (ref 70–99)

## 2021-09-23 LAB — CBC
HCT: 29.3 % — ABNORMAL LOW (ref 36.0–46.0)
Hemoglobin: 9.5 g/dL — ABNORMAL LOW (ref 12.0–15.0)
MCH: 31.1 pg (ref 26.0–34.0)
MCHC: 32.4 g/dL (ref 30.0–36.0)
MCV: 96.1 fL (ref 80.0–100.0)
Platelets: 226 10*3/uL (ref 150–400)
RBC: 3.05 MIL/uL — ABNORMAL LOW (ref 3.87–5.11)
RDW: 19.2 % — ABNORMAL HIGH (ref 11.5–15.5)
WBC: 7.8 10*3/uL (ref 4.0–10.5)
nRBC: 0 % (ref 0.0–0.2)

## 2021-09-23 MED ORDER — IRON SUCROSE 20 MG/ML IV SOLN
200.0000 mg | Freq: Once | INTRAVENOUS | Status: AC
  Administered 2021-09-23: 200 mg via INTRAVENOUS
  Filled 2021-09-23: qty 10

## 2021-09-23 MED ORDER — ENSURE ENLIVE PO LIQD
237.0000 mL | Freq: Two times a day (BID) | ORAL | Status: DC
Start: 2021-09-23 — End: 2021-09-25
  Administered 2021-09-23 – 2021-09-25 (×3): 237 mL via ORAL

## 2021-09-23 NOTE — Progress Notes (Signed)
Initial Nutrition Assessment  DOCUMENTATION CODES:   Severe malnutrition in context of chronic illness  INTERVENTION:   - Ensure Enlive po BID, each supplement provides 350 kcal and 20 grams of protein  - Liberalize diet to 2 gram sodium  - Encourage PO intake  NUTRITION DIAGNOSIS:   Severe Malnutrition related to chronic illness (CKD stage IV, CHF) as evidenced by severe fat depletion, severe muscle depletion.  GOAL:   Patient will meet greater than or equal to 90% of their needs  MONITOR:   PO intake, Supplement acceptance, Labs, Weight trends, I & O's  REASON FOR ASSESSMENT:   Consult Other ("nutritional goals")  ASSESSMENT:   86 year old female who presented to the ED on 6/01 with SOB. PMH of HTN, HLD, DVT s/p IVC filter, atrial fibrillation, hypothyroidism, pulmonary HTN on home O2, CKD stage IV, scleroderma, GI bleed, chronic diarrhea. Pt admitted with AKI secondary to cardiorenal syndrome, acute on chronic diastolic CHF.  Spoke with pt and daughter at bedside. Pt eating a banana milkshake from cookout at time of RD visit. She reports that she did not care for what she ordered for lunch. Pt reports good appetite at this time and states that it is improved compared to PTA. Pt reports that for about 1-2 weeks PTA, she was consuming just a few bites at meals then feeling full. Pt attributes this to fluid and volume overload. Pt states that she lives in a retirement community and is provided with 3 meals daily. Pt states that portion sizes ares large so she normally just consumes 50% of the plated meal. Pt typically drinks juice or soda with her meals.  Pt reports recent weight gain from UBW of 88 lbs to 94 lbs. She attributes this to volume overload. Pt reports that several years ago she weighed 115 lbs but over the course of time has lost weight down to 88 lbs. Reviewed weight history in chart. Pt's weight has fluctuated between 36-42 kg over the last 7 months. Suspect weight  fluctuations are related to fluid status.  Pt reports that she follows a low sodium diet as best she can at her retirement community. When pt's daughter brings food, it is always low sodium. Pt does not take a daily MVI due to high calcium. Pt denies any issues chewing or swallowing. Pt reports ambulating without the use of any assistive devices. Pt does report chronic diarrhea secondary to scleroderma but that it is manageable.  Pt frustrated with limited menu options on Heart Healthy/Carb Modified diet. Will liberalize to 2 gram sodium and can liberalize further if needed. Pt willing to drink oral nutrition supplements during admission. Encouraged pt to continue to consume these after discharge to aid in meeting kcal and protein needs and preventing additional dry weight loss.  Admit weight: 39.7 kg Current weight: 40.9 kg  Pt with deep pitting edema to BLE.  Meal Completion: 100% x 1 documented meal  Medications reviewed and include: colace, IV lasix, SSI, protonix, prednisone  Vitamin/Mineral Profile: Vitamin B12: 1307 (high) Folate B9: >40.0 (WNL)  Labs reviewed: BUN 52, creatinine 2.97 CBG's: 43-98 x 24 hours  UOP: 2600 ml x 24 hours I/O's: -1.9 L since admit  NUTRITION - FOCUSED PHYSICAL EXAM:  Flowsheet Row Most Recent Value  Orbital Region Severe depletion  Upper Arm Region Moderate depletion  Thoracic and Lumbar Region Severe depletion  Buccal Region Severe depletion  Temple Region Severe depletion  Clavicle Bone Region Severe depletion  Clavicle and Acromion Bone Region Severe  depletion  Scapular Bone Region Severe depletion  Dorsal Hand Moderate depletion  Patellar Region Moderate depletion  Anterior Thigh Region Moderate depletion  Posterior Calf Region Moderate depletion  Edema (RD Assessment) Moderate  [BLE]  Hair Reviewed  Eyes Reviewed  Mouth Reviewed  Skin Reviewed  Nails Reviewed       Diet Order:   Diet Order             Diet 2 gram sodium  Room service appropriate? Yes; Fluid consistency: Thin; Fluid restriction: 1500 mL Fluid  Diet effective now                   EDUCATION NEEDS:   Education needs have been addressed  Skin:  Skin Assessment: Reviewed RN Assessment  Last BM:  09/22/21  Height:   Ht Readings from Last 1 Encounters:  09/22/21 _0  (1.473 m)    Weight:   Wt Readings from Last 1 Encounters:  09/23/21 40.9 kg    BMI:  Body mass index is 18.83 kg/m.  Estimated Nutritional Needs:   Kcal:  1550-1750  Protein:  65-75 grams  Fluid:  1.5-1.7 L    Gustavus Bryant, MS, RD, LDN Inpatient Clinical Dietitian Please see AMiON for contact information.

## 2021-09-23 NOTE — Progress Notes (Addendum)
Pharmacy Consult for Pulmonary Hypertension Treatment   Indication - Continuation of prior to admission medication   Patient is 86 y.o.  with history of PAH on chronic Macitentan (Opsumit) PTA and will be continued while hospitalized.   Continuing this medication order as an inpatient requires that monitoring parameters per REMS requirements must be met.  Chronic therapy is under the supervision of Dr. Richrd Humbles,  [phone]) who is enrolled in the REMS program (confirmed 09/22/21 12:42 PM) and is being notified of continuation of therapy.  Prescriber not available to send a staff message in [place] , thus I phoned MD  ([facility]) to notify certified prescriber of  pt admission. However unable to speak to anyone after long hold time. I left message requesting to call us back at [phone] or [phone].   Per patient report has previously been educated on Hepatotoxicity . On admission pregnancy risk has been assessed and no monitoring required. Hepatic function has been evaluated. AST/ALT appropriate to continue medication at this time.     Latest Ref Rng & Units 09/22/2021    7:30 AM 06/04/2021    1:01 AM 06/03/2021   12:58 AM  Hepatic Function  Total Protein 6.5 - 8.1 g/dL 5.7   5.5   5.6    Albumin 3.5 - 5.0 g/dL 3.2   2.4   2.3    AST 15 - 41 U/L 14   34   58    ALT 0 - 44 U/L 16   84   112    Alk Phosphatase 38 - 126 U/L 44   161   175    Total Bilirubin 0.3 - 1.2 mg/dL 0.5   0.6   0.5    Bilirubin, Direct 0.0 - 0.2 mg/dL <0.1        If any question arise or pregnancy is identified during hospitalization, contact for macitentan: [phone].  Thank for you allowing [place] to participate in the care of this patient.   Nicole Cella, RPh Clinical Pharmacist Please check AMION for all [place] phone numbers After 10:00 PM, call [place] [phone] 09/23/2021 2:32 PM

## 2021-09-23 NOTE — Evaluation (Signed)
Occupational Therapy Evaluation Patient Details Name: Meghan Wise MRN: [id number] DOB: 1936-03-26 Today's Date: 09/23/2021   History of Present Illness Pt is an 86 yr old female admitted with cardiorenal syndrome andAcute on Chronic hypoxic resp failure.  PMH of CKD, hypertension, CHF, hypothyroidism, osteoarthritis, pulmonary hypertension and scleroderma   Clinical Impression   Pt currently supervision for selfcare tasks sit to stand and toileiting.  Oxygen sats at 100% at rest on 4Ls nasal cannula.  With activity they would decrease into the upper 80/s but readings inconsistent secondary to pulse ox coming off of her ear and not being able to get one on her finger secondary to Raynaud's disease.  Feel she will benefit from acute care OT while in the hospital to help regain strength and help education on energy conservation strategies at they relate to selfcare independence.        Recommendations for follow up therapy are one component of a multi-disciplinary discharge planning process, led by the attending physician.  Recommendations may be updated based on patient status, additional functional criteria and insurance authorization.   Follow Up Recommendations  No OT follow up    Assistance Recommended at Discharge None  Patient can return home with the following Assistance with cooking/housework;Assist for transportation    Functional Status Assessment  Patient has had a recent decline in their functional status and demonstrates the ability to make significant improvements in function in a reasonable and predictable amount of time.  Equipment Recommendations  None recommended by OT       Precautions / Restrictions Precautions Precautions: Fall Precaution Comments: O2 dependency Restrictions Weight Bearing Restrictions: No      Mobility Bed Mobility Overal bed mobility: Needs Assistance Bed Mobility: Supine to Sit, Sit to Supine     Supine to sit: Supervision Sit to  supine: Supervision        Transfers Overall transfer level: Needs assistance Equipment used: None Transfers: Sit to/from Stand, Bed to chair/wheelchair/BSC Sit to Stand: Supervision     Step pivot transfers: Supervision     General transfer comment: No assistive device used without LOB noted, but pt reports that she does feel unsteady.  May need AD initially depending on balance during PT assessment.      Balance Overall balance assessment: Needs assistance Sitting-balance support: No upper extremity supported Sitting balance-Leahy Scale: Good     Standing balance support: No upper extremity supported, During functional activity Standing balance-Leahy Scale: Fair                             ADL either performed or assessed with clinical judgement   ADL Overall ADL's : Needs assistance/impaired Eating/Feeding: Independent;Sitting   Grooming: Wash/dry face;Wash/dry hands;Oral care;Supervision/safety;Standing   Upper Body Bathing: Set up;Sitting Upper Body Bathing Details (indicate cue type and reason): simulated Lower Body Bathing: Supervison/ safety;Sit to/from stand Lower Body Bathing Details (indicate cue type and reason): simulated Upper Body Dressing : Sitting;Supervision/safety   Lower Body Dressing: Supervision/safety;Sit to/from stand   Toilet Transfer: Supervision/safety;Ambulation;Comfort height toilet;Grab bars   Toileting- Clothing Manipulation and Hygiene: Supervision/safety;Sit to/from stand       Functional mobility during ADLs: Supervision/safety (ambulation, no device) General ADL Comments: Pt with HR in the mid 70s throughout session at rest and with activity.  Oxygen sats on 4Ls from 100% at rest down to the upper 80's while completing grooming tasks at the sink  Pt with Raynaud's disease, so probe on  her ear and readings sporadic.  She reports walking to her dinning hall every day but needs to take a rest half way which is her baseline.   No LOB noted with completion of toilet transfer and LB selfcare tasks.     Vision Baseline Vision/History: 1 Wears glasses (poor vision in right eye secondadary to loss of retina per pt report) Ability to See in Adequate Light: 1 Impaired Patient Visual Report: No change from baseline Vision Assessment?: No apparent visual deficits     Perception Perception Perception: Within Functional Limits   Praxis Praxis Praxis: Intact    Pertinent Vitals/Pain Pain Assessment Pain Assessment: (P) No/denies pain     Hand Dominance Right   Extremity/Trunk Assessment Upper Extremity Assessment Upper Extremity Assessment: Generalized weakness   Lower Extremity Assessment Lower Extremity Assessment: Defer to PT evaluation   Cervical / Trunk Assessment Cervical / Trunk Assessment: Normal   Communication Communication Communication: No difficulties   Cognition Arousal/Alertness: Awake/alert Behavior During Therapy: WFL for tasks assessed/performed Overall Cognitive Status: Within Functional Limits for tasks assessed                                                  Home Living Family/patient expects to be discharged to:: Other (Comment) ([place]) Living Arrangements: Alone   Type of Home: Independent living facility Home Access: Level entry     Home Layout: One level     Bathroom Shower/Tub: Occupational psychologist: Standard     Home Equipment: Conservation officer, nature (2 wheels);Grab bars - tub/shower;Shower seat;Adaptive equipment Adaptive Equipment: Reacher Additional Comments: has apartment with community dinning      Prior Functioning/Environment Prior Level of Function : Independent/Modified Independent;Driving             Mobility Comments: Did not use an assistive device currently but has them ADLs Comments: Ind with ADLs/selfcare, simple meal prep        OT Problem List: Cardiopulmonary status limiting activity;Impaired  balance (sitting and/or standing)      OT Treatment/Interventions: Self-care/ADL training;Balance training;Energy conservation;Therapeutic activities;DME and/or AE instruction;Therapeutic exercise;Patient/family education    OT Goals(Current goals can be found in the care plan section) Acute Rehab OT Goals Patient Stated Goal: Pt wants to get back to see her friends at [facility]. OT Goal Formulation: With patient/family Time For Goal Achievement: 10/07/21 Potential to Achieve Goals: Good  OT Frequency: Min 2X/week       AM-PAC OT "6 Clicks" Daily Activity     Outcome Measure Help from another person eating meals?: None Help from another person taking care of personal grooming?: None Help from another person toileting, which includes using toliet, bedpan, or urinal?: A Little Help from another person bathing (including washing, rinsing, drying)?: A Little Help from another person to put on and taking off regular upper body clothing?: None Help from another person to put on and taking off regular lower body clothing?: A Little 6 Click Score: 21   End of Session Equipment Utilized During Treatment: Gait belt;Oxygen Nurse Communication: Mobility status  Activity Tolerance: Patient tolerated treatment well Patient left: in bed;with call bell/phone within reach;with family/visitor present  OT Visit Diagnosis: Unsteadiness on feet (R26.81)                Time: 9741-6384 OT Time Calculation (min): 39 min Charges:  OT General Charges $OT Visit: 1 Visit OT Evaluation $OT Eval Low Complexity: 1 Low OT Treatments $Self Care/Home Management : 23-37 mins Pearlee Arvizu OTR/L 09/23/2021, 9:33 AM

## 2021-09-23 NOTE — Consult Note (Signed)
[facility] Inpatient Consult  09/23/2021  Scotlyn Mccranie [facility] 1936-02-24 [id number]  [place] Management [facility] CM)   Patient chart has been reviewed with noted high risk score for unplanned readmissions.  Patient assessed for community [place] Management follow up needs.   Plan: Will continue to follow for progression and disposition plans.  Of note, [facility] Care Management services does not replace or interfere with any services that are arranged by inpatient case management or social work.    Netta Cedars, MSN, RN [facility] Liaison Toll free office [phone]

## 2021-09-23 NOTE — TOC Initial Note (Signed)
Transition of Care [facility]) - Initial/Assessment Note    Patient Details  Name: Meghan Wise MRN: [id number] Date of Birth: [birth year]  Transition of Care [facility]) CM/SW Contact:    Marilu Favre, RN Phone Number: 09/23/2021, 3:39 PM  Clinical Narrative:                 Spoke to patient at bedside. Patient from home alone. Daughter can help if needed.   Patient has transportation to appointments and can get prescriptions filled.   Patient has home oxygen including portable oxygen concentrator , walker and shower chair.   Family can provide transportation home at discharge.   Received a call from [place] with [facility], patient active with them for HHPT willneed orders. Confirmed with patient , she does want to continue HHPT with [facility].   Expected Discharge Plan: [place] Barriers to Discharge: Continued Medical Work up   Patient Goals and CMS Choice Patient states their goals for this hospitalization and ongoing recovery are:: to return to home CMS Medicare.gov Compare Post Acute Care list provided to:: Patient Choice offered to / list presented to : Patient  Expected Discharge Plan and Services Expected Discharge Plan: [place]   Discharge Planning Services: CM Consult Post Acute Care Choice: [place] arrangements for the past 2 months: Apartment                 DME Arranged: N/A         HH Arranged: PT HH Agency: [place] Date [place]: 09/23/21 Time [place]: 38 Representative spoke with at [place]: [place] Arrangements/Services Living arrangements for the past 2 months: [place] with:: Self Patient language and need for interpreter reviewed:: Yes Do you feel safe going back to the place where you live?: Yes      Need for Family Participation in Patient Care: Yes (Comment) Care giver support system in place?: Yes (comment) Current home services:  DME Criminal Activity/Legal Involvement Pertinent to Current Situation/Hospitalization: No - Comment as needed  Activities of Daily Living Home Assistive Devices/Equipment: None ADL Screening (condition at time of admission) Patient's cognitive ability adequate to safely complete daily activities?: Yes Is the patient deaf or have difficulty hearing?: No Does the patient have difficulty seeing, even when wearing glasses/contacts?: No Does the patient have difficulty concentrating, remembering, or making decisions?: No Patient able to express need for assistance with ADLs?: No Does the patient have difficulty dressing or bathing?: Yes Independently performs ADLs?: No Communication: Independent Dressing (OT): Needs assistance Is this a change from baseline?: Change from baseline, expected to last <3days Grooming: Needs assistance Is this a change from baseline?: Pre-admission baseline Feeding: Independent Bathing: Independent Toileting: Needs assistance Is this a change from baseline?: Change from baseline, expected to last <3 days In/Out Bed: Needs assistance Is this a change from baseline?: Change from baseline, expected to last <3 days Walks in Home: Independent Does the patient have difficulty walking or climbing stairs?: Yes Weakness of Legs: Both Weakness of Arms/Hands: None  Permission Sought/Granted   Permission granted to share information with : No              Emotional Assessment Appearance:: Appears stated age Attitude/Demeanor/Rapport: Engaged Affect (typically observed): Accepting Orientation: : Oriented to Self, Oriented to Place, Oriented to  Time, Oriented to Situation Alcohol / Substance Use: Not Applicable Psych Involvement: No (comment)  Admission diagnosis:  Peripheral edema [  R60.9] AKI (acute kidney injury) ([place]) [N17.9] Cardiorenal syndrome with renal failure, stage 1-4 or unspecified chronic kidney disease, with heart failure (HCC) [I13.0] Acute  on chronic congestive heart failure, unspecified heart failure type [facility]) [I50.9] Patient Active Problem List   Diagnosis Date Noted   Cardiorenal syndrome with renal failure, stage 1-4 or unspecified chronic kidney disease, with heart failure ([place]) 09/22/2021   Atrial fibrillation, chronic ([place]) 09/22/2021   DNR (do not resuscitate) 09/22/2021   Protein calorie malnutrition ([place]) 09/22/2021   Pressure injury of skin 06/01/2021   Symptomatic anemia 05/31/2021   Chronic kidney disease, stage 3b ([place]) 05/31/2021   History of venous thromboembolism 05/31/2021   Leukocytosis 05/31/2021   CHF (congestive heart failure) ([place]) 03/11/2021   DVT (deep venous thrombosis) ([place]) 03/11/2021   Pleuritic pain 03/10/2021   ILD (interstitial lung disease) ([place]) 12/27/2018   Pulmonary hypertension ([place]) 12/27/2018   Dyspnea 04/04/2018   SOB (shortness of breath) 04/02/2018   Scleroderma ([place]) 04/02/2018   PNA (pneumonia) 07/22/2016   Acute on chronic respiratory failure with hypoxia ([place]) 07/21/2016   Community acquired pneumonia 07/21/2016   CKD (chronic kidney disease) stage 4, GFR 15-29 ml/min ([place]) 07/21/2016   Anemia of chronic disease 07/21/2016   AVM (arteriovenous malformation) of duodenum, acquired 05/07/2014   Chronic diastolic CHF (congestive heart failure), NYHA class 4 ([place]) 05/28/2012   HTN (hypertension)    Raynaud disease    Hypothyroidism    PCP:  Collene Leyden, MD Pharmacy:   [id number] [place], [place] [facility] [address] [id number]: [phone]: [phone] [id number] [place]: [phone]: [phone]   Social Determinants of Health (SDOH) Interventions    Readmission Risk Interventions    06/05/2021   10:09 AM  Readmission Risk Prevention Plan  Transportation Screening Complete   PCP or Specialist Appt within 3-5 Days Complete  HRI or [place]  Social Work Consult for [place]/Counseling Complete  Palliative Care Screening Not Applicable  Medication Review (RPress photographerComplete

## 2021-09-23 NOTE — TOC CM/SW Note (Signed)
Received a message from Meghan Wise with [facility] . Patient currently active with [facility] for HHPT will need orders and face to face

## 2021-09-23 NOTE — Progress Notes (Signed)
[facility] Progress Note    Assessment/ Plan:   # AKI  - Appears secondary to cardiorenal syndrome -c/w lasix 66m IV BID for today, Cr slightly better today  - strict ins/outs, daily weights   # CKD stage IV - baseline Cr 1.7-2.0  - note that she is a poor dialysis candidate and has also elected not to pursue dialysis should her renal function worsen - she has reinforced this again on 6/2   # Acute on Chronic hypoxic resp failure  - Feeling better s/p lasix 40 mg IV this AM - continue IV lasix to optimize volume status   # HTN  - Controlled on current regimen     # Metabolic acidosis - setting of AKI and overload,improved    # Macrocytic anemia, anemia of CKD, h/o GIB - has been receiving IV iron, last dose was Venofer 2069mon 5/9. Per patient and daughter, was supposed to receive another dose. Will arrange for a repeat iron panel and will order venofer 20039mV x 1 dose for today  Subjective:   No acute events overnight. Patient reports feeling much better. Still has some swelling but improving. On 4lN[place]ose Passtient reports that her left leg is always slightly more swollen than her right.   Objective:   BP (!) 126/50 (BP Location: Left Arm)   Pulse 67   Temp 98.1 F (36.7 C) (Oral)   Resp 18   Ht _0  (1.473 m)   Wt 40.9 kg   SpO2 95%   BMI 18.83 kg/m   Intake/Output Summary (Last 24 hours) at 09/23/2021 [id number] data filed at 09/23/2021 0830 Gross per 24 hour  Intake 840 ml  Output 2750 ml  Net -1910 ml   Weight change: 1.169 kg  Physical Exam: Gen:NAD, sitting up in bed CVS:s1s2, rrr Resp:cta bl AbdHCW:CBJSt:2+ pitting edema (lt>rt) Neuro: awake, alert  Imaging: DG Chest 2 View  Result Date: 09/21/2021 CLINICAL DATA:  Cough and fever EXAM: CHEST - 2 VIEW COMPARISON:  Chest x-ray dated June 01, 2021 FINDINGS: Unchanged cardiac and mediastinal contours. Biapical pleural-parenchymal scarring. Background emphysema. Trace right pleural  effusion. No focal consolidation. IMPRESSION: 1. Background emphysema with no new focal consolidation. 2. New trace right pleural effusion. Electronically Signed   By: LeaYetta GlassmanD.   On: 09/21/2021 13:46   DG Chest Port 1 View  Result Date: 09/22/2021 CLINICAL DATA:  Shortness of breath. EXAM: PORTABLE CHEST 1 VIEW COMPARISON:  Chest x-ray from yesterday. FINDINGS: Stable cardiomediastinal silhouette with mild cardiomegaly. Progressive mild diffuse interstitial thickening. Unchanged trace right pleural effusion. No consolidation or pneumothorax. No acute osseous abnormality. IMPRESSION: 1. Progressive mild interstitial pulmonary edema. Electronically Signed   By: WilTitus DubinD.   On: 09/22/2021 07:54   VAS US [place] EXTREMITY VENOUS (DVT) (7a-7p)  Result Date: 09/22/2021  Lower Venous DVT Study Patient Name:  Meghan HATFIELDate of Exam:   09/22/2021 Medical Rec #: [id number]       Accession #:    [id number] of Birth: 4/518-Nov-1937        Patient Gender: F Patient Age:   [age]ars [facility]      VAS US [place] EXTREMITY VENOUS (DVT) Referring Phys: JULIE HAVILAND --------------------------------------------------------------------------------  Indications: Edema, and Patient taken off fluid pill by primary care doctor, gained 6 pounds overnight and became edematous and more short of breath.  Risk Factors: Patient  on 4 liters of home O2. Limitations: Sever pitting edema in the calves. Comparison Study: Prior studies done 06/10/21 and 03/11/21 indicated age                   indeterminate DVT in the right popliteal vein. Performing Technologist: Sharion Dove RVS  Examination Guidelines: A complete evaluation includes B-mode imaging, spectral Doppler, color Doppler, and power Doppler as needed of all accessible portions of each vessel. Bilateral testing is considered an integral part of a complete examination. Limited examinations for reoccurring indications  may be performed as noted. The reflux portion of the exam is performed with the patient in reverse Trendelenburg.  +--------+---------------+---------+-----------+----------------+-------------+ RIGHT   CompressibilityPhasicitySpontaneityProperties      Thrombus                                                                 Aging         +--------+---------------+---------+-----------+----------------+-------------+ CFV     Full                               pulsatile                                                                waveforms                     +--------+---------------+---------+-----------+----------------+-------------+ SFJ     Full                                                             +--------+---------------+---------+-----------+----------------+-------------+ FV Prox Full                                                             +--------+---------------+---------+-----------+----------------+-------------+ FV Mid  Full                                                             +--------+---------------+---------+-----------+----------------+-------------+ FV      Full                                                             Distal                                                                   +--------+---------------+---------+-----------+----------------+-------------+  PFV     Full                                                             +--------+---------------+---------+-----------+----------------+-------------+ POP     Partial                            pulsatile       Chronic                                                  waveforms                     +--------+---------------+---------+-----------+----------------+-------------+ PTV     Full                                                              +--------+---------------+---------+-----------+----------------+-------------+ PERO    Full                                                             +--------+---------------+---------+-----------+----------------+-------------+   +--------+---------------+---------+-----------+----------------+-------------+ LEFT    CompressibilityPhasicitySpontaneityProperties      Thrombus                                                                 Aging         +--------+---------------+---------+-----------+----------------+-------------+ CFV     Full                               pulsatile                                                                waveforms                     +--------+---------------+---------+-----------+----------------+-------------+ SFJ     Full                                                             +--------+---------------+---------+-----------+----------------+-------------+ FV Prox Full                                                             +--------+---------------+---------+-----------+----------------+-------------+  FV Mid  Full                                                             +--------+---------------+---------+-----------+----------------+-------------+ FV      Full                                                             Distal                                                                   +--------+---------------+---------+-----------+----------------+-------------+ PFV     Full                                                             +--------+---------------+---------+-----------+----------------+-------------+ POP     Full                               pulsatile                                                                waveforms                     +--------+---------------+---------+-----------+----------------+-------------+ PTV     Full                                                              +--------+---------------+---------+-----------+----------------+-------------+ PERO    Full                                                             +--------+---------------+---------+-----------+----------------+-------------+     Summary: BILATERAL: - No evidence of deep vein thrombosis seen in the lower extremities, bilaterally. -No evidence of popliteal cyst, bilaterally. RIGHT: - Findings appear improved from previous examination. pulsatile waveforms suggestive of fluid overload  LEFT: Pulsatile waveforms suggestive of fluid overload.  *See table(s) above for measurements and observations. Electronically signed by Orlie Pollen on 09/22/2021 at 5:19:40 PM.    Final     Labs: BMET Recent Labs  Lab 09/22/21 0730 09/22/21 1320 09/23/21 0437  NA 141  --  140  K 4.1  --  3.6  CL 114*  --  110  CO2 18*  --  22  GLUCOSE 89 114* 81  BUN 57*  --  52*  CREATININE 3.09*  --  2.97*  CALCIUM 8.7*  --  8.6*   CBC Recent Labs  Lab 09/22/21 0730 09/23/21 0437  WBC 7.4 7.8  NEUTROABS 5.4  --   HGB 9.1* 9.5*  HCT 29.2* 29.3*  MCV 100.0 96.1  PLT 217 226    Medications:     amiodarone  200 mg Oral Daily   atorvastatin  20 mg Oral Daily   docusate sodium  100 mg Oral BID   enoxaparin (LOVENOX) injection  30 mg Subcutaneous Q24H   furosemide  40 mg Intravenous BID   insulin aspart  0-6 Units Subcutaneous TID WC   levothyroxine  75 mcg Oral Q0600   macitentan  10 mg Oral Daily   pantoprazole  40 mg Oral BID   predniSONE  5 mg Oral Q breakfast   sodium chloride flush  3 mL Intravenous Q12H   tadalafil  20 mg Oral Daily      Gean Quint, MD [facility] 09/23/2021, 9:16 AM

## 2021-09-23 NOTE — Progress Notes (Signed)
PROGRESS NOTE    Meghan Wise  XBJ:[id number] DOB: 03/03/1936 DOA: 09/22/2021 PCP: Collene Leyden, MD  Outpatient Specialists:     Brief Narrative:  Patient is an 86 year old Caucasian female with past medical history significant for hypertension, hyperlipidemia, DVT s/p IVC filter placement, atrial fibrillation on Eliquis, hypothyroidism, pulmonary hypertension on 4 to 5 L of oxygen via nasal cannula per minute at home, chronic kidney disease stage IV and scleroderma.  Patient was admitted with worsening shortness of breath and renal function.  Worsening renal function is thought to be secondary to cardiorenal syndrome.  Patient is on IV Lasix.  Slight improvement in serum creatinine is noted.  Shortness of breath is improving with diuresis.  Leg edema has also improved.   Assessment & Plan:   Principal Problem:   Cardiorenal syndrome with renal failure, stage 1-4 or unspecified chronic kidney disease, with heart failure (HCC) Active Problems:   HTN (hypertension)   Hypothyroidism   Chronic diastolic CHF (congestive heart failure), NYHA class 4 (HCC)   CKD (chronic kidney disease) stage 4, GFR 15-29 ml/min (HCC)   Scleroderma (HCC)   Pulmonary hypertension (HCC)   History of venous thromboembolism   Atrial fibrillation, chronic (HCC)   DNR (do not resuscitate)   Protein calorie malnutrition (HCC)   Cardiorenal syndrome - acute on chronic diastolic CHF -Patient with anasarca  -Echo was performed on 05/31/21 with preserved EF and showed grade 2 diastolic dysfunction -She was taken off torsemide several weeks ago and now appears to be grossly overloaded despite restarting it a few days ago -Give 60 mg IV Lasix in the ER -Patient is currently on IV Lasix 40 Mg twice daily. -Volume overload is improving. -Shortness of breath is also improving. -Some improvement in renal function noted.   Cardiorenal syndrome - stage IV CKD -See above documentation. -Nephrology input is  appreciated. -Continue to monitor renal function, electrolytes and volume status. -Continue to avoid nephrotoxins. -Keep MAP greater than 65 mmHg.     Pulmonary HTN -Chronically on 4L-5 [place] O2 -Optimize volume. -Have a low threshold to involve the pulmonary team.   -Continue Opsumit and tadalafil -Continue prednisone   Afib -Continue Amiodarone -Eliquis was held during prior hospitalization and has not been resumed    HLD -Continue Lipitor   HTN -Continue Norvasc, Coreg   Hypothyroidism -TSH was elevated today -She reports that she has not had a dosage adjustment in many years -Will increase Synthroid from 50 to 75 mcg daily -Needs f/u TSH in 4-6 weeks   H/o DVT -Previously on Eliquis but this was stopped in the setting of recent GI bleeding and has not been restarted -s/p IVC filter placement on 2/9    Recent GI bleed -Eliquis was held in this setting -No current c/o bleeding -Patient started Cipro over the weekend for "bacterial overgrowth" in the colon -Cipro is currently on hold.   Scleroderma -on denosumab as an outpatient   Malnutrition -Body mass index is 18.29 kg/m..  -The patient has at least 2 indicators for malnutrition (loss of muscle mass, loss of subcutaneous fat, edema -This is likely due to chronic disease  -Will obtain a nutrition consult for further recommendations.      Advance Care Planning:   Code Status: DNR    DVT prophylaxis: Subcutaneous Lovenox Code Status: DNR Family Communication: Daughter Disposition Plan: This will depend on hospital course.   Consultants:  Cardiology Nephrology  Procedures:  None  Antimicrobials:  None   Subjective: Shortness of breath  is improving.  Objective: Vitals:   09/23/21 0003 09/23/21 0004 09/23/21 0424 09/23/21 0737  BP: 124/62  (!) 118/57 (!) 126/50  Pulse: (!) 57 (!) 59 (!) 58 67  Resp: (!) _0 Temp: 98.2 F (36.8 C)  98.1 F (36.7 C) 98.1 F (36.7 C)  TempSrc: Oral   Oral Oral  SpO2: 94% 96% 94% 95%  Weight: 40.9 kg     Height:        Intake/Output Summary (Last 24 hours) at 09/23/2021 1029 Last data filed at 09/23/2021 0830 Gross per 24 hour  Intake 840 ml  Output 2750 ml  Net -1910 ml   Filed Weights   09/22/21 0651 09/23/21 0003  Weight: 39.7 kg 40.9 kg    Examination:  General exam: Appears calm and comfortable.  Patient is cachectic. Respiratory system: Decreased air entry globally.   Cardiovascular system: S1 & S2 heard Gastrointestinal system: Abdomen is nondistended, soft and nontender. No organomegaly or masses felt. Normal bowel sounds heard. Central nervous system: Alert and oriented.  Patient moves all extremities.. Extremities: 2-2+ bilateral lower extremity edema.     Data Reviewed: I have personally reviewed following labs and imaging studies  CBC: Recent Labs  Lab 09/22/21 0730 09/23/21 0437  WBC 7.4 7.8  NEUTROABS 5.4  --   HGB 9.1* 9.5*  HCT 29.2* 29.3*  MCV 100.0 96.1  PLT 217 372   Basic Metabolic Panel: Recent Labs  Lab 09/22/21 0730 09/22/21 1320 09/23/21 0437  NA 141  --  140  K 4.1  --  3.6  CL 114*  --  110  CO2 18*  --  22  GLUCOSE 89 114* 81  BUN 57*  --  52*  CREATININE 3.09*  --  2.97*  CALCIUM 8.7*  --  8.6*   GFR: Estimated Creatinine Clearance: 9.1 mL/min (A) (by C-G formula based on SCr of 2.97 mg/dL (H)). Liver Function Tests: Recent Labs  Lab 09/22/21 0730  AST 14*  ALT 16  ALKPHOS 44  BILITOT 0.5  PROT 5.7*  ALBUMIN 3.2*   No results for input(s): LIPASE, AMYLASE in the last 168 hours. No results for input(s): AMMONIA in the last 168 hours. Coagulation Profile: No results for input(s): INR, PROTIME in the last 168 hours. Cardiac Enzymes: No results for input(s): CKTOTAL, CKMB, CKMBINDEX, TROPONINI in the last 168 hours. BNP (last 3 results) No results for input(s): PROBNP in the last 8760 hours. HbA1C: Recent Labs    09/22/21 1259  HGBA1C 5.0   CBG: Recent Labs   Lab 09/22/21 1308 09/22/21 1402 09/22/21 1602 09/22/21 2055 09/23/21 0615  GLUCAP 45* 83 91 98 80   Lipid Profile: No results for input(s): CHOL, HDL, LDLCALC, TRIG, CHOLHDL, LDLDIRECT in the last 72 hours. Thyroid Function Tests: Recent Labs    09/22/21 0759  TSH 14.665*   Anemia Panel: Recent Labs    09/22/21 1000 09/22/21 1600  VITAMINB12  --  1,307*  FOLATE >40.0  --   FERRITIN  --  36  TIBC  --  301  IRON  --  46   Urine analysis:    Component Value Date/Time   COLORURINE STRAW (A) 09/22/2021 0624   APPEARANCEUR CLEAR 09/22/2021 0624   LABSPEC 1.006 09/22/2021 0624   PHURINE 5.0 09/22/2021 0624   GLUCOSEU NEGATIVE 09/22/2021 0624   HGBUR NEGATIVE 09/22/2021 0624   BILIRUBINUR NEGATIVE 09/22/2021 0624   KETONESUR NEGATIVE 09/22/2021 0624   PROTEINUR NEGATIVE 09/22/2021 9021  UROBILINOGEN 0.2 05/24/2012 1450   NITRITE NEGATIVE 09/22/2021 0624   LEUKOCYTESUR NEGATIVE 09/22/2021 9480   Sepsis Labs: _0 (procalcitonin:4,lacticidven:4)  ) Recent Results (from the past 240 hour(s))  Resp Panel by RT-PCR (Flu A&B, Covid) Anterior Nasal Swab     Status: None   Collection Time: 09/22/21  8:54 AM   Specimen: Anterior Nasal Swab  Result Value Ref Range Status   SARS Coronavirus 2 by RT PCR NEGATIVE NEGATIVE Final    Comment: (NOTE) SARS-CoV-2 target nucleic acids are NOT DETECTED.  The SARS-CoV-2 RNA is generally detectable in upper respiratory specimens during the acute phase of infection. The lowest concentration of SARS-CoV-2 viral copies this assay can detect is 138 copies/mL. A negative result does not preclude SARS-Cov-2 infection and should not be used as the sole basis for treatment or other patient management decisions. A negative result may occur with  improper specimen collection/handling, submission of specimen other than nasopharyngeal swab, presence of viral mutation(s) within the areas targeted by this assay, and inadequate number of  viral copies(<138 copies/mL). A negative result must be combined with clinical observations, patient history, and epidemiological information. The expected result is Negative.  Fact Sheet for Patients:  EntrepreneurPulse.com.au  Fact Sheet for Healthcare Providers:  IncredibleEmployment.be  This test is no t yet approved or cleared by the [place] FDA and  has been authorized for detection and/or diagnosis of SARS-CoV-2 by FDA under an Emergency Use Authorization (EUA). This EUA will remain  in effect (meaning this test can be used) for the duration of the COVID-19 declaration under Section 564(b)(1) of the Act, 21 U.S.C.section 360bbb-3(b)(1), unless the authorization is terminated  or revoked sooner.       Influenza A by PCR NEGATIVE NEGATIVE Final   Influenza B by PCR NEGATIVE NEGATIVE Final    Comment: (NOTE) The Xpert Xpress SARS-CoV-2/FLU/RSV plus assay is intended as an aid in the diagnosis of influenza from Nasopharyngeal swab specimens and should not be used as a sole basis for treatment. Nasal washings and aspirates are unacceptable for Xpert Xpress SARS-CoV-2/FLU/RSV testing.  Fact Sheet for Patients: EntrepreneurPulse.com.au  Fact Sheet for Healthcare Providers: IncredibleEmployment.be  This test is not yet approved or cleared by the [place] FDA and has been authorized for detection and/or diagnosis of SARS-CoV-2 by FDA under an Emergency Use Authorization (EUA). This EUA will remain in effect (meaning this test can be used) for the duration of the COVID-19 declaration under Section 564(b)(1) of the Act, 21 U.S.C. section 360bbb-3(b)(1), unless the authorization is terminated or revoked.  Performed at [facility], [place] [address] [place], [place] [address]          Radiology Studies: DG Chest 2 View  Result Date: 09/21/2021 CLINICAL DATA:  Cough and fever EXAM:  CHEST - 2 VIEW COMPARISON:  Chest x-ray dated June 01, 2021 FINDINGS: Unchanged cardiac and mediastinal contours. Biapical pleural-parenchymal scarring. Background emphysema. Trace right pleural effusion. No focal consolidation. IMPRESSION: 1. Background emphysema with no new focal consolidation. 2. New trace right pleural effusion. Electronically Signed   By: Yetta Glassman M.D.   On: 09/21/2021 13:46   DG Chest Port 1 View  Result Date: 09/22/2021 CLINICAL DATA:  Shortness of breath. EXAM: PORTABLE CHEST 1 VIEW COMPARISON:  Chest x-ray from yesterday. FINDINGS: Stable cardiomediastinal silhouette with mild cardiomegaly. Progressive mild diffuse interstitial thickening. Unchanged trace right pleural effusion. No consolidation or pneumothorax. No acute osseous abnormality. IMPRESSION: 1. Progressive mild interstitial pulmonary edema. Electronically Signed  By: Titus Dubin M.D.   On: 09/22/2021 07:54   VAS [place] LOWER EXTREMITY VENOUS (DVT) (7a-7p)  Result Date: 09/22/2021  Lower Venous DVT Study Patient Name:  KHLOEI SPIKER  Date of Exam:   09/22/2021 Medical Rec #: [id number]          Accession #:    [id number] Date of Birth: [birth year]           Patient Gender: F Patient Age:   6 years Exam Location:  [facility] Procedure:      VAS [place] LOWER EXTREMITY VENOUS (DVT) Referring Phys: JULIE HAVILAND --------------------------------------------------------------------------------  Indications: Edema, and Patient taken off fluid pill by primary care doctor, gained 6 pounds overnight and became edematous and more short of breath.  Risk Factors: Patient on 4 liters of home O2. Limitations: Sever pitting edema in the calves. Comparison Study: Prior studies done 06/10/21 and 03/11/21 indicated age                   indeterminate DVT in the right popliteal vein. Performing Technologist: Sharion Dove RVS  Examination Guidelines: A complete evaluation includes B-mode imaging, spectral Doppler, color  Doppler, and power Doppler as needed of all accessible portions of each vessel. Bilateral testing is considered an integral part of a complete examination. Limited examinations for reoccurring indications may be performed as noted. The reflux portion of the exam is performed with the patient in reverse Trendelenburg.  +--------+---------------+---------+-----------+----------------+-------------+ RIGHT   CompressibilityPhasicitySpontaneityProperties      Thrombus                                                                 Aging         +--------+---------------+---------+-----------+----------------+-------------+ CFV     Full                               pulsatile                                                                waveforms                     +--------+---------------+---------+-----------+----------------+-------------+ SFJ     Full                                                             +--------+---------------+---------+-----------+----------------+-------------+ FV Prox Full                                                             +--------+---------------+---------+-----------+----------------+-------------+ FV Mid  Full                                                             +--------+---------------+---------+-----------+----------------+-------------+  FV      Full                                                             Distal                                                                   +--------+---------------+---------+-----------+----------------+-------------+ PFV     Full                                                             +--------+---------------+---------+-----------+----------------+-------------+ POP     Partial                            pulsatile       Chronic                                                  waveforms                      +--------+---------------+---------+-----------+----------------+-------------+ PTV     Full                                                             +--------+---------------+---------+-----------+----------------+-------------+ PERO    Full                                                             +--------+---------------+---------+-----------+----------------+-------------+   +--------+---------------+---------+-----------+----------------+-------------+ LEFT    CompressibilityPhasicitySpontaneityProperties      Thrombus                                                                 Aging         +--------+---------------+---------+-----------+----------------+-------------+ CFV     Full                               pulsatile  waveforms                     +--------+---------------+---------+-----------+----------------+-------------+ SFJ     Full                                                             +--------+---------------+---------+-----------+----------------+-------------+ FV Prox Full                                                             +--------+---------------+---------+-----------+----------------+-------------+ FV Mid  Full                                                             +--------+---------------+---------+-----------+----------------+-------------+ FV      Full                                                             Distal                                                                   +--------+---------------+---------+-----------+----------------+-------------+ PFV     Full                                                             +--------+---------------+---------+-----------+----------------+-------------+ POP     Full                               pulsatile                                                                 waveforms                     +--------+---------------+---------+-----------+----------------+-------------+ PTV     Full                                                             +--------+---------------+---------+-----------+----------------+-------------+  PERO    Full                                                             +--------+---------------+---------+-----------+----------------+-------------+     Summary: BILATERAL: - No evidence of deep vein thrombosis seen in the lower extremities, bilaterally. -No evidence of popliteal cyst, bilaterally. RIGHT: - Findings appear improved from previous examination. pulsatile waveforms suggestive of fluid overload  LEFT: Pulsatile waveforms suggestive of fluid overload.  *See table(s) above for measurements and observations. Electronically signed by Orlie Pollen on 09/22/2021 at 5:19:40 PM.    Final         Scheduled Meds:  amiodarone  200 mg Oral Daily   atorvastatin  20 mg Oral Daily   docusate sodium  100 mg Oral BID   enoxaparin (LOVENOX) injection  30 mg Subcutaneous Q24H   furosemide  40 mg Intravenous BID   insulin aspart  0-6 Units Subcutaneous TID WC   levothyroxine  75 mcg Oral Q0600   macitentan  10 mg Oral Daily   pantoprazole  40 mg Oral BID   predniSONE  5 mg Oral Q breakfast   sodium chloride flush  3 mL Intravenous Q12H   tadalafil  20 mg Oral Daily   Continuous Infusions:  iron sucrose       LOS: 1 day    Time spent: 55 minutes    Dana Allan, MD  [facility] Pager #: [phone] 7PM-7AM contact night coverage as above

## 2021-09-23 NOTE — Evaluation (Signed)
Physical Therapy Evaluation Patient Details Name: Meghan Wise MRN: [id number] DOB: March 04, 1936 Today's Date: 09/23/2021  History of Present Illness  Pt is an 86 yr old female admitted with cardiorenal syndrome andAcute on Chronic hypoxic resp failure.  PMH of CKD, hypertension, CHF, hypothyroidism, osteoarthritis, pulmonary hypertension and scleroderma   Clinical Impression  Pt presents with mobility,strength, and balance deficits due to above HPI. Patient lives at [place] and is Modified independent at [facility]. Currently pt is Mod independent with bed mobiltiy and transfers, but requires min guard for gait with RW. Pt desats inot mid 80's on 4L/min with gait, but recovers with standing rest to >92%. Pt will benefit from skilled PT for current impairments to progress towards PLOF. Recommending follow up with HHPT once pt returns to [place]. Pt would like to have PT thorugh [facility] if her insurance will cover this.     Recommendations for follow up therapy are one component of a multi-disciplinary discharge planning process, led by the attending physician.  Recommendations may be updated based on patient status, additional functional criteria and insurance authorization.  Follow Up Recommendations Home health PT    Assistance Recommended at Discharge Intermittent Supervision/Assistance  Patient can return home with the following  A little help with walking and/or transfers;A little help with bathing/dressing/bathroom;Assistance with cooking/housework;Assist for transportation;Help with stairs or ramp for entrance;Direct supervision/assist for medications management    Equipment Recommendations Rollator (4 wheels) (Rollator with a seat)  Recommendations for Other Services       Functional Status Assessment Patient has had a recent decline in their functional status and demonstrates the ability to make significant improvements in function in a reasonable and predictable amount of time.      Precautions / Restrictions Precautions Precautions: Fall Precaution Comments: O2 dependency Restrictions Weight Bearing Restrictions: No      Mobility  Bed Mobility Overal bed mobility: Modified Independent Bed Mobility: Sit to Supine       Sit to supine: Supervision   General bed mobility comments: Pt moves well throughout bed mobility    Transfers Overall transfer level: Modified independent Equipment used: None Transfers: Sit to/from Stand Sit to Stand: Supervision           General transfer comment: Pt demonstrated sit to stand with min guard, needs AD when ambulating for LOB.    Ambulation/Gait Ambulation/Gait assistance: Min guard Gait Distance (Feet): 180 Feet Assistive device: Rolling walker (2 wheels) Gait Pattern/deviations: WFL(Within Functional Limits), Step-through pattern, Decreased step length - right, Decreased step length - left, Decreased stride length, Narrow base of support Gait velocity: decr        Stairs            Wheelchair Mobility    Modified Rankin (Stroke Patients Only)       Balance Overall balance assessment: Needs assistance Sitting-balance support: No upper extremity supported Sitting balance-Leahy Scale: Good     Standing balance support: Reliant on assistive device for balance, Bilateral upper extremity supported, During functional activity Standing balance-Leahy Scale: Fair Standing balance comment: Pt maintains standing posture but requires external support for LOB.                             Pertinent Vitals/Pain Pain Assessment Pain Assessment: No/denies pain    Home Living Family/patient expects to be discharged to:: Private residence Living Arrangements: Alone Available Help at Discharge: Other (Comment) (ILF [employer]) Type of Home: Independent living facility Home  Access: Level entry       Home Layout: One level Home Equipment: Rolling Walker (2 wheels);Grab bars -  tub/shower;Shower seat;Adaptive equipment      Prior Function Prior Level of Function : Independent/Modified Independent;Driving             Mobility Comments: Did not use an assistive device currently but has them       Hand Dominance   Dominant Hand: Right    Extremity/Trunk Assessment   Upper Extremity Assessment Upper Extremity Assessment: Defer to OT evaluation    Lower Extremity Assessment Lower Extremity Assessment: Overall WFL for tasks assessed;Generalized weakness;RLE deficits/detail RLE Deficits / Details: Pt presented with abnormal swelling in legs Bil, increased swellin in Rt leg. Pt asked to remove socks, when moved-visible indented line presented Bil d/t swelling. RLE Sensation: WNL RLE Coordination: WNL    Cervical / Trunk Assessment Cervical / Trunk Assessment: Normal  Communication   Communication: No difficulties  Cognition Arousal/Alertness: Awake/alert Behavior During Therapy: WFL for tasks assessed/performed Overall Cognitive Status: Within Functional Limits for tasks assessed                                 General Comments: Pt demonstrated normal cognition and alertness.        General Comments General comments (skin integrity, edema, etc.): Pt presented with Bil edema in legs, incr in Rt leg.    Exercises     Assessment/Plan    PT Assessment Patient needs continued PT services  PT Problem List Decreased strength;Decreased range of motion;Decreased activity tolerance;Decreased balance;Decreased mobility;Cardiopulmonary status limiting activity;Decreased skin integrity;Decreased knowledge of use of DME       PT Treatment Interventions DME instruction;Balance training;Gait training;Stair training;Functional mobility training;Therapeutic activities;Patient/family education;Modalities    PT Goals (Current goals can be found in the Care Plan section)  Acute Rehab PT Goals Patient Stated Goal: To continue skilled therapy  from home 3-4x a week. PT Goal Formulation: With patient/family Time For Goal Achievement: 10/06/21    Frequency Min 3X/week     Co-evaluation               AM-PAC PT "6 Clicks" Mobility  Outcome Measure Help needed turning from your back to your side while in a flat bed without using bedrails?: A Little Help needed moving from lying on your back to sitting on the side of a flat bed without using bedrails?: A Little Help needed moving to and from a bed to a chair (including a wheelchair)?: A Little Help needed standing up from a chair using your arms (e.g., wheelchair or bedside chair)?: A Little Help needed to walk in hospital room?: A Little Help needed climbing 3-5 steps with a railing? : A Little 6 Click Score: 18    End of Session Equipment Utilized During Treatment: Gait belt;Oxygen Activity Tolerance: Patient tolerated treatment well;Patient limited by fatigue Patient left: in bed;with call bell/phone within reach;with family/visitor present Nurse Communication: Mobility status PT Visit Diagnosis: Unsteadiness on feet (R26.81);History of falling (Z91.81);Repeated falls (R29.6);Muscle weakness (generalized) (M62.81);Other abnormalities of gait and mobility (R26.89)    Time: 1111-1140 PT Time Calculation (min) (ACUTE ONLY): 29 min   Charges:   PT Evaluation $PT Eval Low Complexity: 1 Low PT Treatments $Gait Training: 8-22 mins        Margie Ege, SPT Acute Rehab [facility] 09/23/2021, 12:47 PM

## 2021-09-24 DIAGNOSIS — J9621 Acute and chronic respiratory failure with hypoxia: Secondary | ICD-10-CM | POA: Diagnosis not present

## 2021-09-24 DIAGNOSIS — M349 Systemic sclerosis, unspecified: Secondary | ICD-10-CM

## 2021-09-24 DIAGNOSIS — I482 Chronic atrial fibrillation, unspecified: Secondary | ICD-10-CM | POA: Diagnosis not present

## 2021-09-24 DIAGNOSIS — N184 Chronic kidney disease, stage 4 (severe): Secondary | ICD-10-CM

## 2021-09-24 DIAGNOSIS — E43 Unspecified severe protein-calorie malnutrition: Secondary | ICD-10-CM

## 2021-09-24 DIAGNOSIS — Z66 Do not resuscitate: Secondary | ICD-10-CM

## 2021-09-24 DIAGNOSIS — D649 Anemia, unspecified: Secondary | ICD-10-CM

## 2021-09-24 DIAGNOSIS — E876 Hypokalemia: Secondary | ICD-10-CM

## 2021-09-24 DIAGNOSIS — I159 Secondary hypertension, unspecified: Secondary | ICD-10-CM

## 2021-09-24 DIAGNOSIS — E039 Hypothyroidism, unspecified: Secondary | ICD-10-CM

## 2021-09-24 DIAGNOSIS — I130 Hypertensive heart and chronic kidney disease with heart failure and stage 1 through stage 4 chronic kidney disease, or unspecified chronic kidney disease: Secondary | ICD-10-CM | POA: Diagnosis not present

## 2021-09-24 DIAGNOSIS — I272 Pulmonary hypertension, unspecified: Secondary | ICD-10-CM

## 2021-09-24 DIAGNOSIS — I5032 Chronic diastolic (congestive) heart failure: Secondary | ICD-10-CM

## 2021-09-24 LAB — CBC
HCT: 31.5 % — ABNORMAL LOW (ref 36.0–46.0)
Hemoglobin: 10.3 g/dL — ABNORMAL LOW (ref 12.0–15.0)
MCH: 31.4 pg (ref 26.0–34.0)
MCHC: 32.7 g/dL (ref 30.0–36.0)
MCV: 96 fL (ref 80.0–100.0)
Platelets: 240 10*3/uL (ref 150–400)
RBC: 3.28 MIL/uL — ABNORMAL LOW (ref 3.87–5.11)
RDW: 19 % — ABNORMAL HIGH (ref 11.5–15.5)
WBC: 7.5 10*3/uL (ref 4.0–10.5)
nRBC: 0 % (ref 0.0–0.2)

## 2021-09-24 LAB — GLUCOSE, CAPILLARY
Glucose-Capillary: 110 mg/dL — ABNORMAL HIGH (ref 70–99)
Glucose-Capillary: 95 mg/dL (ref 70–99)
Glucose-Capillary: 97 mg/dL (ref 70–99)
Glucose-Capillary: 103 mg/dL — ABNORMAL HIGH (ref 70–99)
Glucose-Capillary: 118 mg/dL — ABNORMAL HIGH (ref 70–99)

## 2021-09-24 LAB — RENAL FUNCTION PANEL
Albumin: 3 g/dL — ABNORMAL LOW (ref 3.5–5.0)
Anion gap: 9 (ref 5–15)
BUN: 56 mg/dL — ABNORMAL HIGH (ref 8–23)
CO2: 25 mmol/L (ref 22–32)
Calcium: 8.7 mg/dL — ABNORMAL LOW (ref 8.9–10.3)
Chloride: 109 mmol/L (ref 98–111)
Creatinine, Ser: 2.84 mg/dL — ABNORMAL HIGH (ref 0.44–1.00)
GFR, Estimated: 16 mL/min — ABNORMAL LOW (ref >60)
Glucose, Bld: 91 mg/dL (ref 70–99)
Phosphorus: 4.8 mg/dL — ABNORMAL HIGH (ref 2.5–4.6)
Potassium: 3.4 mmol/L — ABNORMAL LOW (ref 3.5–5.1)
Sodium: 143 mmol/L (ref 135–145)

## 2021-09-24 MED ORDER — POTASSIUM CHLORIDE CRYS ER 20 MEQ PO TBCR
40.0000 meq | EXTENDED_RELEASE_TABLET | Freq: Once | ORAL | Status: AC
  Administered 2021-09-24: 40 meq via ORAL
  Filled 2021-09-24: qty 2

## 2021-09-24 NOTE — Progress Notes (Signed)
Pt complaining nauseous but denies dizziness and no pain. PRN meds Given, VS as follows. BP (!) 117/55   Pulse (!) 58   Temp 98.1 F (36.7 C) (Oral)   Resp 18   Ht _0  (1.473 m)   Wt 39.6 kg   SpO2 96%   BMI 18.25 kg/m   Will monitor.

## 2021-09-24 NOTE — Progress Notes (Addendum)
PROGRESS NOTE    Meghan Wise  EZM:[id number] DOB: 1936/03/22 DOA: 09/22/2021 PCP: Collene Leyden, MD    Brief Narrative:  Patient is an 86 year old female with past medical history significant for hypertension, hyperlipidemia, DVT s/p IVC filter placement, atrial fibrillation on Eliquis, hypothyroidism, pulmonary hypertension on 4 to 5 L of oxygen via nasal cannula per minute at home, chronic kidney disease stage IV and scleroderma who presented to hospital with worsening shortness of breath and renal function..  Patient was then admitted hospital for IV diuresis.  Assessment & Plan:    Principal Problem:   Cardiorenal syndrome with renal failure, stage 1-4 or unspecified chronic kidney disease, with heart failure (HCC) Active Problems:   Acute on chronic respiratory failure with hypoxia (HCC)   CKD (chronic kidney disease) stage 4, GFR 15-29 ml/min (HCC)   HTN (hypertension)   Hypothyroidism   Chronic diastolic CHF (congestive heart failure), NYHA class 4 (HCC)   Scleroderma (HCC)   Pulmonary hypertension (HCC)   History of venous thromboembolism   Atrial fibrillation, chronic ([place])   DNR (do not resuscitate)   Protein-calorie malnutrition, severe   Hypokalemia   Anemia   Cardiorenal syndrome - acute on chronic diastolic CHF Patient presented with anasarca and fluid overload.  2D echocardiogram on 05/31/21 with preserved EF and showed grade 2 diastolic dysfunction.  Patient was taken off torsemide several weeks ago by nephrology..  At this time, diuretics have been restarted while in the hospital and currently on IV Lasix 40 mg twice a day.  Volume status and shortness of breath has improved.  Creatinine today at 2.8 from 2.9.  We will continue to monitor closely.  Nephrology on board.  Mild hypokalemia.  We will replenish.  Check levels in AM.   stage IV CKD Nephrology on board.  Will follow recommendations.   Pulmonary HTN with acute on chronic hypoxic respiratory  failure. Patient is on  on 4L-5 [place] O2 chronically,Continue Opsumit, tadalafil and prednisone.  On basal oxygen requirement at this time.   Atrial fibrillation. Continue amiodarone.  Eliquis was hold on last admission and has not been resumed.    Hyperlipidemia. -Continue Lipitor   HTN On Norvasc and Coreg.  We will continue to monitor blood pressure.   Hypothyroidism Elevated TSH.  Synthroid dose was increased from 50 to 75 mcg.  Will need follow-up TSH in 4 to 6 weeks.     H/o DVT -Previously on Eliquis but this was stopped in the setting of recent GI bleeding and has not been restarted.  Status post IVC filter placement on 06/02/2021.   Recent GI bleed -Eliquis was held in this setting.  Patient was started on Cipro for bacterial overgrowth in the colon.  Currently on hold.  Latest hemoglobin of 10.3.   Scleroderma -on denosumab as an outpatient   Severe protein calorie malnutrition Present on admission. Body mass index is 18.29 kg/m.Marland Kitchen  Dietitian on board.  Continue protein supplements.  Debility, deconditioning.  Has been seen by physical therapy and recommend home health PT on discharge.       DVT prophylaxis: enoxaparin (LOVENOX) injection 30 mg Start: 09/23/21 1000   Code Status:     Code Status: DNR  Disposition: Home with home health  Status is: Inpatient  Remains inpatient appropriate because: Volume overload requiring diuresis   Family Communication: Spoke with the patient at bedside  Consultants:  Nephrology  Procedures:  None  Antimicrobials:  None  Anti-infectives (From admission, onward)  None       Subjective: Today, patient was seen and examined at bedside.  Patient complains of mild nausea and has been diuresing.  Breathing has not worsened.  Denies any chest pain or vomiting.  Objective: Vitals:   09/24/21 0013 09/24/21 0245 09/24/21 0739 09/24/21 0800  BP:  (!) 117/55 (!) 114/59   Pulse:  (!) 58  65  Resp:  18  (!) 22  Temp:   98.1 F (36.7 C)  98.4 F (36.9 C)  TempSrc:  Oral    SpO2: 100% 96%  95%  Weight:  39.6 kg    Height:        Intake/Output Summary (Last 24 hours) at 09/24/2021 0904 Last data filed at 09/24/2021 0838 Gross per 24 hour  Intake 437 ml  Output 1550 ml  Net -1113 ml   Filed Weights   09/22/21 0651 09/23/21 0003 09/24/21 0245  Weight: 39.7 kg 40.9 kg 39.6 kg    Physical Examination: Body mass index is 18.25 kg/m.   General: Thinly built, not in obvious distress, on nasal cannula oxygen HENT:   No scleral pallor or icterus noted. Oral mucosa is moist.  Chest:    Diminished breath sounds bilaterally.  CVS: S1 &S2 heard. No murmur.  Regular rate and rhythm. Abdomen: Soft, nontender, nondistended.  Bowel sounds are heard.   Extremities: No cyanosis, clubbing with bilateral lower extremity edema.  Peripheral pulses are palpable. Psych: Alert, awake and oriented, normal mood CNS:  No cranial nerve deficits.  Power equal in all extremities.   Skin: Warm and dry.  No rashes noted.  Data Reviewed:   CBC: Recent Labs  Lab 09/22/21 0730 09/23/21 0437 09/24/21 0723  WBC 7.4 7.8 7.5  NEUTROABS 5.4  --   --   HGB 9.1* 9.5* 10.3*  HCT 29.2* 29.3* 31.5*  MCV 100.0 96.1 96.0  PLT 217 226 426    Basic Metabolic Panel: Recent Labs  Lab 09/22/21 0730 09/22/21 1320 09/23/21 0437 09/24/21 0723  NA 141  --  140 143  K 4.1  --  3.6 3.4*  CL 114*  --  110 109  CO2 18*  --  22 25  GLUCOSE 89 114* 81 91  BUN 57*  --  52* 56*  CREATININE 3.09*  --  2.97* 2.84*  CALCIUM 8.7*  --  8.6* 8.7*  PHOS  --   --   --  4.8*    Liver Function Tests: Recent Labs  Lab 09/22/21 0730 09/24/21 0723  AST 14*  --   ALT 16  --   ALKPHOS 44  --   BILITOT 0.5  --   PROT 5.7*  --   ALBUMIN 3.2* 3.0*     Radiology Studies: VAS [place] LOWER EXTREMITY VENOUS (DVT) (7a-7p)  Result Date: 09/22/2021  Lower Venous DVT Study Patient Name:  Meghan Wise  Date of Exam:   09/22/2021 Medical Rec #:  [id number]          Accession #:    [id number] Date of Birth: 1936/01/26           Patient Gender: F Patient Age:   63 years Exam Location:  [facility] Procedure:      VAS [place] LOWER EXTREMITY VENOUS (DVT) Referring Phys: JULIE HAVILAND --------------------------------------------------------------------------------  Indications: Edema, and Patient taken off fluid pill by primary care doctor, gained 6 pounds overnight and became edematous and more short of breath.  Risk Factors: Patient on 4 liters of  home O2. Limitations: Sever pitting edema in the calves. Comparison Study: Prior studies done 06/10/21 and 03/11/21 indicated age                   indeterminate DVT in the right popliteal vein. Performing Technologist: Sharion Dove RVS  Examination Guidelines: A complete evaluation includes B-mode imaging, spectral Doppler, color Doppler, and power Doppler as needed of all accessible portions of each vessel. Bilateral testing is considered an integral part of a complete examination. Limited examinations for reoccurring indications may be performed as noted. The reflux portion of the exam is performed with the patient in reverse Trendelenburg.  +--------+---------------+---------+-----------+----------------+-------------+ RIGHT   CompressibilityPhasicitySpontaneityProperties      Thrombus                                                                 Aging         +--------+---------------+---------+-----------+----------------+-------------+ CFV     Full                               pulsatile                                                                waveforms                     +--------+---------------+---------+-----------+----------------+-------------+ SFJ     Full                                                             +--------+---------------+---------+-----------+----------------+-------------+ FV Prox Full                                                              +--------+---------------+---------+-----------+----------------+-------------+ FV Mid  Full                                                             +--------+---------------+---------+-----------+----------------+-------------+ FV      Full                                                             Distal                                                                   +--------+---------------+---------+-----------+----------------+-------------+  PFV     Full                                                             +--------+---------------+---------+-----------+----------------+-------------+ POP     Partial                            pulsatile       Chronic                                                  waveforms                     +--------+---------------+---------+-----------+----------------+-------------+ PTV     Full                                                             +--------+---------------+---------+-----------+----------------+-------------+ PERO    Full                                                             +--------+---------------+---------+-----------+----------------+-------------+   +--------+---------------+---------+-----------+----------------+-------------+ LEFT    CompressibilityPhasicitySpontaneityProperties      Thrombus                                                                 Aging         +--------+---------------+---------+-----------+----------------+-------------+ CFV     Full                               pulsatile                                                                waveforms                     +--------+---------------+---------+-----------+----------------+-------------+ SFJ     Full                                                             +--------+---------------+---------+-----------+----------------+-------------+ FV Prox  Full                                                             +--------+---------------+---------+-----------+----------------+-------------+  FV Mid  Full                                                             +--------+---------------+---------+-----------+----------------+-------------+ FV      Full                                                             Distal                                                                   +--------+---------------+---------+-----------+----------------+-------------+ PFV     Full                                                             +--------+---------------+---------+-----------+----------------+-------------+ POP     Full                               pulsatile                                                                waveforms                     +--------+---------------+---------+-----------+----------------+-------------+ PTV     Full                                                             +--------+---------------+---------+-----------+----------------+-------------+ PERO    Full                                                             +--------+---------------+---------+-----------+----------------+-------------+     Summary: BILATERAL: - No evidence of deep vein thrombosis seen in the lower extremities, bilaterally. -No evidence of popliteal cyst, bilaterally. RIGHT: - Findings appear improved from previous examination. pulsatile waveforms suggestive of fluid overload  LEFT: Pulsatile waveforms suggestive of fluid overload.  *See table(s) above for measurements and observations. Electronically signed by Orlie Pollen on 09/22/2021 at 5:19:40 PM.    Final       LOS: 2 days  Flora Lipps, MD [facility] Available via Epic secure chat 7am-7pm After these hours, please refer to coverage provider listed on [email or web address] 09/24/2021, 9:04 AM

## 2021-09-24 NOTE — Significant Event (Addendum)
PRN Given for Constipation.

## 2021-09-24 NOTE — Hospital Course (Addendum)
Patient is an 86 year old female with past medical history significant for hypertension, hyperlipidemia, DVT s/p IVC filter placement, atrial fibrillation on Eliquis, hypothyroidism, pulmonary hypertension on 4 to 5 L of oxygen via nasal cannula per minute at home, chronic kidney disease stage IV and scleroderma  presented to the hospital with worsening shortness of breath and renal function.  Patient was then admitted hospital for IV diuresis.  Nephrology also followed the patient.  Assessment & Plan:    Principal Problem:   Cardiorenal syndrome with renal failure, stage 1-4 or unspecified chronic kidney disease, with heart failure (HCC) Active Problems:   Acute on chronic respiratory failure with hypoxia (HCC)   CKD (chronic kidney disease) stage 4, GFR 15-29 ml/min (HCC)   HTN (hypertension)   Hypothyroidism   Chronic diastolic CHF (congestive heart failure), NYHA class 4 (HCC)   Scleroderma (HCC)   Pulmonary hypertension (HCC)   History of venous thromboembolism   Atrial fibrillation, chronic ([place])   DNR (do not resuscitate)   Protein-calorie malnutrition, severe   Anemia   Cardiorenal syndrome - acute on chronic diastolic CHF Patient presented with anasarca and fluid overload.  2D echocardiogram on 05/31/21 with preserved EF and showed grade 2 diastolic dysfunction.  Patient was taken off torsemide several weeks ago by nephrology..  At this time, diuretics have been restarted while in the hospital and was on IV Lasix 40 mg twice a day.  Patient was on torsemide 10 mg at home which has been changed to torsemide 20 mg daily.  Communicated with nephrology prior to disposition.  Volume status and shortness of breath has improved.  Creatinine today at 2.7 from 2.8 <2.9.  Patient will follow-up with nephrology as outpatient.  Mild hypokalemia.  Replenished and improved.  Potassium prior to discharge was 3.7.   stage IV CKD Patient is a poor hemodialysis candidate.  Follows up with nephrology  as outpatient.  On torsemide for volume management.  Pulmonary HTN with acute on chronic hypoxic respiratory failure. Patient is on  on 4L-5 [place] O2 chronically,Continue Opsumit, tadalafil and prednisone..  On basal oxygen requirement at this time.   Atrial fibrillation. Continue amiodarone.  Eliquis was hold on last admission and has not been resumed.    Hyperlipidemia. -Continue Lipitor on discharge.   HTN On Norvasc and Coreg.  Blood pressure remained stable.   Hypothyroidism Elevated TSH.  Synthroid dose was increased from 50 to 75 mcg.  Will need follow-up TSH in 4 to 6 weeks.     H/o DVT -Previously on Eliquis but this was stopped in the setting of recent GI bleeding and has not been restarted.  Status post IVC filter placement on 06/02/2021.   Recent GI bleed -Eliquis was held in this setting.   Latest hemoglobin of 10.2.   Scleroderma -on denosumab as an outpatient   Severe protein calorie malnutrition Present on admission. Body mass index is 18.29 kg/m.Marland Kitchen  Seen follow the patient during hospitalization.  We will continue Ensure on discharge  Debility, deconditioning.  Has been seen by physical therapy and recommend home health PT on discharge.

## 2021-09-24 NOTE — Progress Notes (Signed)
Occupational Therapy Treatment Patient Details Name: Meghan Wise MRN: [id number] DOB: 02/26/1936 Today's Date: 09/24/2021   History of present illness Pt is an 86 yr old female admitted with cardiorenal syndrome andAcute on Chronic hypoxic resp failure.  PMH of CKD, hypertension, CHF, hypothyroidism, osteoarthritis, pulmonary hypertension and scleroderma   OT comments  Pt. Seen for skilled OT treatment session.  Able to complete toileting task and hand washing with S.  LB dressing seated with set up.  Reviewed energy conservation strategies for home.  Pt. Receptive and also able to provide examples of modifications and strategies she uses for energy conservation as well.  Agree with current d/c recommendations.     Recommendations for follow up therapy are one component of a multi-disciplinary discharge planning process, led by the attending physician.  Recommendations may be updated based on patient status, additional functional criteria and insurance authorization.    Follow Up Recommendations  No OT follow up    Assistance Recommended at Discharge None  Patient can return home with the following  Assistance with cooking/housework;Assist for transportation   Equipment Recommendations  None recommended by OT    Recommendations for Other Services      Precautions / Restrictions Precautions Precautions: Fall Precaution Comments: O2 dependency       Mobility Bed Mobility                    Transfers Overall transfer level: Modified independent Equipment used: None Transfers: Sit to/from Stand, Bed to chair/wheelchair/BSC Sit to Stand: Modified independent (Device/Increase time)     Step pivot transfers: Supervision           Balance                                           ADL either performed or assessed with clinical judgement   ADL Overall ADL's : Needs assistance/impaired     Grooming: Wash/dry hands;Standing;Min guard                Lower Body Dressing: Set up;Sitting/lateral leans Lower Body Dressing Details (indicate cue type and reason): donned socks crossing each leg over without assistance Toilet Transfer: Supervision/safety;Ambulation;Comfort height toilet;Grab bars   Toileting- Clothing Manipulation and Hygiene: Supervision/safety;Sit to/from stand       Functional mobility during ADLs: Supervision/safety General ADL Comments: states she is used to managing and maneuvering around with o2 tubing so did not need review of management and demonstrated safety with it during ambulation to/from bathroom    Extremity/Trunk Assessment              Vision       Perception     Praxis      Cognition Arousal/Alertness: Awake/alert Behavior During Therapy: WFL for tasks assessed/performed Overall Cognitive Status: Within Functional Limits for tasks assessed                                          Exercises      Shoulder Instructions       General Comments      Pertinent Vitals/ Pain       Pain Assessment Pain Assessment: No/denies pain  Home Living  Prior Functioning/Environment              Frequency  Min 2X/week        Progress Toward Goals  OT Goals(current goals can now be found in the care plan section)  Progress towards OT goals: Progressing toward goals     Plan      Co-evaluation                 AM-PAC OT "6 Clicks" Daily Activity     Outcome Measure   Help from another person eating meals?: None Help from another person taking care of personal grooming?: None Help from another person toileting, which includes using toliet, bedpan, or urinal?: A Little Help from another person bathing (including washing, rinsing, drying)?: A Little Help from another person to put on and taking off regular upper body clothing?: None Help from another person to put on and taking off  regular lower body clothing?: A Little 6 Click Score: 21    End of Session Equipment Utilized During Treatment: Gait belt;Oxygen  OT Visit Diagnosis: Unsteadiness on feet (R26.81)   Activity Tolerance Patient tolerated treatment well   Patient Left in bed;with call bell/phone within reach   Nurse Communication Other (comment) (alerted rn pt. request for stool softener)        Time: 3496-1164 OT Time Calculation (min): 23 min  Charges: OT General Charges $OT Visit: 1 Visit OT Treatments $Self Care/Home Management : 23-37 mins  Sonia Baller, COTA/L Acute Rehabilitation [phone]   Tanya Nones 09/24/2021, 11:35 AM

## 2021-09-24 NOTE — Progress Notes (Signed)
[facility] Progress Note    Assessment/ Plan:   AKI on CKD4 - AKI Appears secondary to cardiorenal syndrome. Baseline Cr 1.7-2.0  - note that she is a poor dialysis candidate and has also elected not to pursue dialysis should her renal function worsen - she has reinforced this again upon our discussion on 6/2 -c/w lasix 40m IV BID for today, Cr slightly better today/overall stable.  Will continue with lasix today and if she improves further, will plan on transitioning her back to torsemide (hopefully by tomorrow) - strict ins/outs, daily weights    Acute on Chronic hypoxic resp failure  - receiving lasix   HTN  - Controlled on current regimen     Metabolic acidosis - setting of AKI and overload,improved/resolved    Macrocytic anemia, anemia of CKD, h/o GIB - has been receiving IV iron, last dose was Venofer 204mon 5/9. Per patient and daughter, was supposed to receive another dose. S/p venofer 6/2, iron panel=iron deficient  Subjective:   No acute events overnight. Patient reports that she continues to feel better, swelling also reported to be much better. Wt down to 39.6.   Objective:   BP (!) 114/59   Pulse 65   Temp 98.4 F (36.9 C)   Resp (!) 22   Ht _0  (1.473 m)   Wt 39.6 kg   SpO2 95%   BMI 18.25 kg/m   Intake/Output Summary (Last 24 hours) at 09/24/2021 0919 Last data filed at 09/24/2021 [id number] per 24 hour  Intake 437 ml  Output 1550 ml  Net -1113 ml   Weight change: -1.269 kg  Physical Exam: Gen:NAD, sitting up at edge of bed CVS:s1s2, rrr Resp:cta bl AbPQZ:RAQTxt: trace-1+ pitting edema (lt>rt)--improving Neuro: awake, alert  Imaging: VAS US[place]OWER EXTREMITY VENOUS (DVT) (7a-7p)  Result Date: 09/22/2021  Lower Venous DVT Study Patient Name:  Meghan ELMANDate of Exam:   09/22/2021 Medical Rec #: [id number]        Accession #:    [id number] of Birth: 4/09-09-1935         Patient Gender: F Patient Age:   [age]ears [facility]  [facility]      [facility] US[place] EXTREMITY VENOUS (DVT) Referring Phys: JULIE HAVILAND --------------------------------------------------------------------------------  Indications: Edema, and Patient taken off fluid pill by primary care doctor, gained 6 pounds overnight and became edematous and more short of breath.  Risk Factors: Patient on 4 liters of home O2. Limitations: Sever pitting edema in the calves. Comparison Study: Prior studies done 06/10/21 and 03/11/21 indicated age                   indeterminate DVT in the right popliteal vein. Performing Technologist: CaSharion DoveVS  Examination Guidelines: A complete evaluation includes B-mode imaging, spectral Doppler, color Doppler, and power Doppler as needed of all accessible portions of each vessel. Bilateral testing is considered an integral part of a complete examination. Limited examinations for reoccurring indications may be performed as noted. The reflux portion of the exam is performed with the patient in reverse Trendelenburg.  +--------+---------------+---------+-----------+----------------+-------------+ RIGHT   CompressibilityPhasicitySpontaneityProperties      Thrombus  Aging         +--------+---------------+---------+-----------+----------------+-------------+ CFV     Full                               pulsatile                                                                waveforms                     +--------+---------------+---------+-----------+----------------+-------------+ SFJ     Full                                                             +--------+---------------+---------+-----------+----------------+-------------+ FV Prox Full                                                             +--------+---------------+---------+-----------+----------------+-------------+ FV Mid  Full                                                              +--------+---------------+---------+-----------+----------------+-------------+ FV      Full                                                             Distal                                                                   +--------+---------------+---------+-----------+----------------+-------------+ PFV     Full                                                             +--------+---------------+---------+-----------+----------------+-------------+ POP     Partial                            pulsatile       Chronic  waveforms                     +--------+---------------+---------+-----------+----------------+-------------+ PTV     Full                                                             +--------+---------------+---------+-----------+----------------+-------------+ PERO    Full                                                             +--------+---------------+---------+-----------+----------------+-------------+   +--------+---------------+---------+-----------+----------------+-------------+ LEFT    CompressibilityPhasicitySpontaneityProperties      Thrombus                                                                 Aging         +--------+---------------+---------+-----------+----------------+-------------+ CFV     Full                               pulsatile                                                                waveforms                     +--------+---------------+---------+-----------+----------------+-------------+ SFJ     Full                                                             +--------+---------------+---------+-----------+----------------+-------------+ FV Prox Full                                                              +--------+---------------+---------+-----------+----------------+-------------+ FV Mid  Full                                                             +--------+---------------+---------+-----------+----------------+-------------+ FV      Full  Distal                                                                   +--------+---------------+---------+-----------+----------------+-------------+ PFV     Full                                                             +--------+---------------+---------+-----------+----------------+-------------+ POP     Full                               pulsatile                                                                waveforms                     +--------+---------------+---------+-----------+----------------+-------------+ PTV     Full                                                             +--------+---------------+---------+-----------+----------------+-------------+ PERO    Full                                                             +--------+---------------+---------+-----------+----------------+-------------+     Summary: BILATERAL: - No evidence of deep vein thrombosis seen in the lower extremities, bilaterally. -No evidence of popliteal cyst, bilaterally. RIGHT: - Findings appear improved from previous examination. pulsatile waveforms suggestive of fluid overload  LEFT: Pulsatile waveforms suggestive of fluid overload.  *See table(s) above for measurements and observations. Electronically signed by Orlie Pollen on 09/22/2021 at 5:19:40 PM.    Final     Labs: BMET Recent Labs  Lab 09/22/21 0730 09/22/21 1320 09/23/21 0437 09/24/21 0723  NA 141  --  140 143  K 4.1  --  3.6 3.4*  CL 114*  --  110 109  CO2 18*  --  22 25  GLUCOSE 89 114* 81 91  BUN 57*  --  52* 56*  CREATININE 3.09*  --  2.97* 2.84*  CALCIUM 8.7*  --  8.6* 8.7*   PHOS  --   --   --  4.8*   CBC Recent Labs  Lab 09/22/21 0730 09/23/21 0437 09/24/21 0723  WBC 7.4 7.8 7.5  NEUTROABS 5.4  --   --   HGB 9.1* 9.5* 10.3*  HCT 29.2* 29.3* 31.5*  MCV 100.0 96.1 96.0  PLT 217 226 240  Medications:     amiodarone  200 mg Oral Daily   atorvastatin  20 mg Oral Daily   docusate sodium  100 mg Oral BID   enoxaparin (LOVENOX) injection  30 mg Subcutaneous Q24H   feeding supplement  237 mL Oral BID BM   furosemide  40 mg Intravenous BID   insulin aspart  0-6 Units Subcutaneous TID WC   levothyroxine  75 mcg Oral Q0600   macitentan  10 mg Oral Daily   pantoprazole  40 mg Oral BID   potassium chloride  40 mEq Oral Once   predniSONE  5 mg Oral Q breakfast   sodium chloride flush  3 mL Intravenous Q12H   tadalafil  20 mg Oral Daily      Gean Quint, MD [facility] 09/24/2021, 9:19 AM

## 2021-09-25 DIAGNOSIS — D649 Anemia, unspecified: Secondary | ICD-10-CM | POA: Diagnosis not present

## 2021-09-25 DIAGNOSIS — Z86718 Personal history of other venous thrombosis and embolism: Secondary | ICD-10-CM

## 2021-09-25 DIAGNOSIS — I130 Hypertensive heart and chronic kidney disease with heart failure and stage 1 through stage 4 chronic kidney disease, or unspecified chronic kidney disease: Secondary | ICD-10-CM | POA: Diagnosis not present

## 2021-09-25 DIAGNOSIS — I482 Chronic atrial fibrillation, unspecified: Secondary | ICD-10-CM | POA: Diagnosis not present

## 2021-09-25 DIAGNOSIS — J9621 Acute and chronic respiratory failure with hypoxia: Secondary | ICD-10-CM | POA: Diagnosis not present

## 2021-09-25 LAB — CBC
HCT: 31.6 % — ABNORMAL LOW (ref 36.0–46.0)
Hemoglobin: 10.2 g/dL — ABNORMAL LOW (ref 12.0–15.0)
MCH: 31.2 pg (ref 26.0–34.0)
MCHC: 32.3 g/dL (ref 30.0–36.0)
MCV: 96.6 fL (ref 80.0–100.0)
Platelets: 227 10*3/uL (ref 150–400)
RBC: 3.27 MIL/uL — ABNORMAL LOW (ref 3.87–5.11)
RDW: 18.7 % — ABNORMAL HIGH (ref 11.5–15.5)
WBC: 8 10*3/uL (ref 4.0–10.5)
nRBC: 0 % (ref 0.0–0.2)

## 2021-09-25 LAB — BASIC METABOLIC PANEL
Anion gap: 10 (ref 5–15)
BUN: 58 mg/dL — ABNORMAL HIGH (ref 8–23)
CO2: 25 mmol/L (ref 22–32)
Calcium: 8.7 mg/dL — ABNORMAL LOW (ref 8.9–10.3)
Chloride: 106 mmol/L (ref 98–111)
Creatinine, Ser: 2.78 mg/dL — ABNORMAL HIGH (ref 0.44–1.00)
GFR, Estimated: 16 mL/min — ABNORMAL LOW (ref >60)
Glucose, Bld: 86 mg/dL (ref 70–99)
Potassium: 3.7 mmol/L (ref 3.5–5.1)
Sodium: 141 mmol/L (ref 135–145)

## 2021-09-25 LAB — MAGNESIUM: Magnesium: 1.7 mg/dL (ref 1.7–2.4)

## 2021-09-25 LAB — GLUCOSE, CAPILLARY
Glucose-Capillary: 87 mg/dL (ref 70–99)
Glucose-Capillary: 83 mg/dL (ref 70–99)

## 2021-09-25 MED ORDER — TORSEMIDE 10 MG PO TABS: 20.0000 mg | ORAL_TABLET | Freq: Every day | ORAL | 2 refills | Status: AC

## 2021-09-25 MED ORDER — TORSEMIDE 20 MG PO TABS
20.0000 mg | ORAL_TABLET | Freq: Every day | ORAL | Status: DC
  Administered 2021-09-25: 20 mg via ORAL
  Filled 2021-09-25: qty 1

## 2021-09-25 MED ORDER — ENSURE ENLIVE PO LIQD: 237.0000 mL | Freq: Two times a day (BID) | ORAL | Status: AC

## 2021-09-25 MED ORDER — LEVOTHYROXINE SODIUM 75 MCG PO TABS: 75.0000 ug | ORAL_TABLET | Freq: Every morning | ORAL | 3 refills | Status: AC

## 2021-09-25 NOTE — Progress Notes (Signed)
Mobility Specialist Progress Note:   09/25/21 0917  Mobility  Activity Ambulated with assistance in hallway  Level of Assistance Standby assist, set-up cues, supervision of patient - no hands on  Assistive Device None  Distance Ambulated (ft) 180 ft  Activity Response Tolerated well  $Mobility charge 1 Mobility   Pt received in bed willing to participate in mobility. No complaints of pain. Left in bed with call bell in reach and all needs met.   [facility] Jaysiah Marchetta Mobility Specialist

## 2021-09-25 NOTE — Discharge Summary (Signed)
Physician Discharge Summary   Patient: Meghan Wise MRN: [id number] DOB: 11-Sep-1935  Admit date:     09/22/2021  Discharge date: 09/25/21  Discharge Physician: Flora Lipps   PCP: Collene Leyden, MD   Recommendations at discharge:   Follow-up with your primary care physician in 1 week.  Check  CBC, BMP magnesium and LFT in the next visit. Please arrange for thyroid function test in 4 to 6 weeks.  Synthroid dose was increased to 75 mcg in the hospital. Follow-up with nephrology as outpatient.  Office to schedule an appointment.  Discharge Diagnoses: Principal Problem:   Cardiorenal syndrome with renal failure, stage 1-4 or unspecified chronic kidney disease, with heart failure (HCC) Active Problems:   Acute on chronic respiratory failure with hypoxia (HCC)   CKD (chronic kidney disease) stage 4, GFR 15-29 ml/min (HCC)   HTN (hypertension)   Hypothyroidism   Chronic diastolic CHF (congestive heart failure), NYHA class 4 (HCC)   Scleroderma (HCC)   Pulmonary hypertension (HCC)   History of venous thromboembolism   Atrial fibrillation, chronic (HCC)   DNR (do not resuscitate)   Protein-calorie malnutrition, severe   Anemia  Resolved Problems:   Hypokalemia  Hospital Course: Patient is an 86 year old female with past medical history significant for hypertension, hyperlipidemia, DVT s/p IVC filter placement, atrial fibrillation on Eliquis, hypothyroidism, pulmonary hypertension on 4 to 5 L of oxygen via nasal cannula per minute at home, chronic kidney disease stage IV and scleroderma  presented to the hospital with worsening shortness of breath and renal function.  Patient was then admitted hospital for IV diuresis.  Nephrology also followed the patient.  Assessment & Plan:    Principal Problem:   Cardiorenal syndrome with renal failure, stage 1-4 or unspecified chronic kidney disease, with heart failure (HCC) Active Problems:   Acute on chronic respiratory failure with hypoxia  (HCC)   CKD (chronic kidney disease) stage 4, GFR 15-29 ml/min (HCC)   HTN (hypertension)   Hypothyroidism   Chronic diastolic CHF (congestive heart failure), NYHA class 4 (HCC)   Scleroderma (HCC)   Pulmonary hypertension (HCC)   History of venous thromboembolism   Atrial fibrillation, chronic ([place])   DNR (do not resuscitate)   Protein-calorie malnutrition, severe   Anemia   Cardiorenal syndrome - acute on chronic diastolic CHF Patient presented with anasarca and fluid overload.  2D echocardiogram on 05/31/21 with preserved EF and showed grade 2 diastolic dysfunction.  Patient was taken off torsemide several weeks ago by nephrology..  At this time, diuretics have been restarted while in the hospital and was on IV Lasix 40 mg twice a day.  Patient was on torsemide 10 mg at home which has been changed to torsemide 20 mg daily.  Communicated with nephrology prior to disposition.  Volume status and shortness of breath has improved.  Creatinine today at 2.7 from 2.8 <2.9.  Patient will follow-up with nephrology as outpatient.  Mild hypokalemia.  Replenished and improved.  Potassium prior to discharge was 3.7.   stage IV CKD Patient is a poor hemodialysis candidate.  Follows up with nephrology as outpatient.  On torsemide for volume management.  Pulmonary HTN with acute on chronic hypoxic respiratory failure. Patient is on  on 4L-5 [place] O2 chronically,Continue Opsumit, tadalafil and prednisone..  On basal oxygen requirement at this time.   Atrial fibrillation. Continue amiodarone.  Eliquis was hold on last admission and has not been resumed.    Hyperlipidemia. -Continue Lipitor on discharge.   HTN On  Norvasc and Coreg.  Blood pressure remained stable.   Hypothyroidism Elevated TSH.  Synthroid dose was increased from 50 to 75 mcg.  Will need follow-up TSH in 4 to 6 weeks.     H/o DVT -Previously on Eliquis but this was stopped in the setting of recent GI bleeding and has not been  restarted.  Status post IVC filter placement on 06/02/2021.   Recent GI bleed -Eliquis was held in this setting.   Latest hemoglobin of 10.2.   Scleroderma -on denosumab as an outpatient   Severe protein calorie malnutrition Present on admission. Body mass index is 18.29 kg/m.Marland Kitchen  Seen follow the patient during hospitalization.  We will continue Ensure on discharge  Debility, deconditioning.  Has been seen by physical therapy and recommend home health PT on discharge.      Consultants: Nephrology  Procedures performed: None  Disposition: Home health  Diet recommendation:  Discharge Diet Orders (From admission, onward)     Start     Ordered   09/25/21 0000  Diet - low sodium heart healthy       Comments: Fluid restriction 1500 MLS per day.   09/25/21 0839           Cardiac diet DISCHARGE MEDICATION: Allergies as of 09/25/2021       Reactions   Cozaar [losartan] Other (See Comments), Cough   High calcium count and renal problems   Nsaids    Other reaction(s): Kidney Disorder   Ace Inhibitors Other (See Comments), Cough   Dizziness and coughing   Sulfa Antibiotics Hives   Codeine Nausea Only   Heparin Anxiety, Other (See Comments)   Pt reports that they have a sensitivity towards heparin. Pt becomes depressed and anxious to the point of crying uncontrollably.         Medication List     STOP taking these medications    ciprofloxacin 500 MG tablet Commonly known as: CIPRO       TAKE these medications    Acetaminophen 500 MG capsule Take 1,000 mg by mouth every 6 (six) hours as needed for fever.   amiodarone 200 MG tablet Commonly known as: PACERONE Take 1 tablet (200 mg total) by mouth 2 (two) times daily for 4 days, THEN 1 tablet (200 mg total) daily. Start taking on: June 05, 2021 What changed: See the new instructions. Notes to patient: Take this evening 09/25/21   atorvastatin 20 MG tablet Commonly known as: LIPITOR Take 20 mg by mouth  daily. Notes to patient: Take tomorrow morning 09/26/21   denosumab 60 MG/ML Sosy injection Commonly known as: PROLIA Inject 60 mg into the skin every 6 (six) months.   feeding supplement Liqd Take 237 mLs by mouth 2 (two) times daily between meals.   ferrous sulfate 324 MG Tbec Take 324 mg by mouth daily.   folic acid 628 MCG tablet Commonly known as: FOLVITE Take 400 mcg by mouth daily.   levothyroxine 75 MCG tablet Commonly known as: SYNTHROID Take 1 tablet (75 mcg total) by mouth every morning. What changed:  medication strength how much to take Notes to patient: Take tomorrow morning 09/26/21   loperamide 2 MG tablet Commonly known as: IMODIUM A-D Take 4 mg by mouth 4 (four) times daily as needed for diarrhea or loose stools.   Melatonin 10 MG Tabs Take 10 mg by mouth daily as needed (sleep).   Opsumit 10 MG tablet Generic drug: macitentan Take 10 mg by mouth daily. Notes to patient:  Take tomorrow morning 09/26/21   OXYGEN Inhale 2-4 L into the lungs continuous. 4 L with exertion 2 L at night   pantoprazole 40 MG tablet Commonly known as: PROTONIX Take 1 tablet (40 mg total) by mouth 2 (two) times daily. Notes to patient: Take tonight @ bedtime 09/25/21   predniSONE 5 MG tablet Commonly known as: DELTASONE Take 5 mg by mouth daily with breakfast. Continuously Notes to patient: Take tomorrow morning 09/26/21   tadalafil (PAH) 20 MG tablet Commonly known as: ADCIRCA Take 20 mg by mouth daily. Notes to patient: Take tomorrow morning 09/26/21   torsemide 10 MG tablet Commonly known as: DEMADEX Take 2 tablets (20 mg total) by mouth daily. What changed: how much to take Notes to patient: Take tomorrow morning 09/26/21   VITAMIN B-12 PO Take 1 tablet by mouth every other day.        Follow-up Information     [facility] [place] Follow up.   Specialty: [facility] Contact information: [place] [address] [phone]          Collene Leyden, MD Follow up.   Specialty: Family Medicine Contact information: [address] [place] [address] [phone]         Gean Quint, MD Follow up.   Specialty: Nephrology Why: office to call Contact information: [address] [place] [address] [phone]                Subjective.   Patient was seen and examined at bedside.  Patient feels better overall.  Wishes to go home.  Denies overt chest pain, shortness of breath  Discharge Exam: Filed Weights   09/24/21 0245 09/25/21 0000 09/25/21 0042  Weight: 39.6 kg 39.1 kg 39.1 kg      09/25/2021   10:52 AM 09/25/2021    7:18 AM 09/25/2021    3:00 AM  Vitals with BMI  Systolic 654 650 354  Diastolic 49 51 52  Pulse 53 63 63    General: Thinly built, not in obvious distress on nasal cannula oxygen HENT:   No scleral pallor or icterus noted. Oral mucosa is moist.  Chest: .  Diminished breath sounds bilaterally.  CVS: S1 &S2 heard. No murmur.  Regular rate and rhythm. Abdomen: Soft, nontender, nondistended.  Bowel sounds are heard.   Extremities: No cyanosis, clubbing trace lower extremity edema, peripheral pulses are palpable. Psych: Alert, awake and oriented, normal mood CNS:  No cranial nerve deficits.  Power equal in all extremities.   Skin: Warm and dry.  No rashes noted.  Condition at discharge: good  The results of significant diagnostics from this hospitalization (including imaging, microbiology, ancillary and laboratory) are listed below for reference.   Imaging Studies: DG Chest 2 View  Result Date: 09/21/2021 CLINICAL DATA:  Cough and fever EXAM: CHEST - 2 VIEW COMPARISON:  Chest x-ray dated June 01, 2021 FINDINGS: Unchanged cardiac and mediastinal contours. Biapical pleural-parenchymal scarring. Background emphysema. Trace right pleural effusion. No focal consolidation. IMPRESSION: 1. Background emphysema with no new focal consolidation. 2. New trace right pleural  effusion. Electronically Signed   By: Yetta Glassman M.D.   On: 09/21/2021 13:46   DG Chest Port 1 View  Result Date: 09/22/2021 CLINICAL DATA:  Shortness of breath. EXAM: PORTABLE CHEST 1 VIEW COMPARISON:  Chest x-ray from yesterday. FINDINGS: Stable cardiomediastinal silhouette with mild cardiomegaly. Progressive mild diffuse interstitial thickening. Unchanged trace right pleural effusion. No consolidation or  pneumothorax. No acute osseous abnormality. IMPRESSION: 1. Progressive mild interstitial pulmonary edema. Electronically Signed   By: Titus Dubin M.D.   On: 09/22/2021 07:54   VAS [place] LOWER EXTREMITY VENOUS (DVT) (7a-7p)  Result Date: 09/22/2021  Lower Venous DVT Study Patient Name:  LALAINE OVERSTREET  Date of Exam:   09/22/2021 Medical Rec #: [id number]          Accession #:    [id number] Date of Birth: 08-Feb-1936           Patient Gender: F Patient Age:   53 years Exam Location:  [facility] Procedure:      VAS [place] LOWER EXTREMITY VENOUS (DVT) Referring Phys: JULIE HAVILAND --------------------------------------------------------------------------------  Indications: Edema, and Patient taken off fluid pill by primary care doctor, gained 6 pounds overnight and became edematous and more short of breath.  Risk Factors: Patient on 4 liters of home O2. Limitations: Sever pitting edema in the calves. Comparison Study: Prior studies done 06/10/21 and 03/11/21 indicated age                   indeterminate DVT in the right popliteal vein. Performing Technologist: Sharion Dove RVS  Examination Guidelines: A complete evaluation includes B-mode imaging, spectral Doppler, color Doppler, and power Doppler as needed of all accessible portions of each vessel. Bilateral testing is considered an integral part of a complete examination. Limited examinations for reoccurring indications may be performed as noted. The reflux portion of the exam is performed with the patient in reverse Trendelenburg.   +--------+---------------+---------+-----------+----------------+-------------+ RIGHT   CompressibilityPhasicitySpontaneityProperties      Thrombus                                                                 Aging         +--------+---------------+---------+-----------+----------------+-------------+ CFV     Full                               pulsatile                                                                waveforms                     +--------+---------------+---------+-----------+----------------+-------------+ SFJ     Full                                                             +--------+---------------+---------+-----------+----------------+-------------+ FV Prox Full                                                             +--------+---------------+---------+-----------+----------------+-------------+  FV Mid  Full                                                             +--------+---------------+---------+-----------+----------------+-------------+ FV      Full                                                             Distal                                                                   +--------+---------------+---------+-----------+----------------+-------------+ PFV     Full                                                             +--------+---------------+---------+-----------+----------------+-------------+ POP     Partial                            pulsatile       Chronic                                                  waveforms                     +--------+---------------+---------+-----------+----------------+-------------+ PTV     Full                                                             +--------+---------------+---------+-----------+----------------+-------------+ PERO    Full                                                              +--------+---------------+---------+-----------+----------------+-------------+   +--------+---------------+---------+-----------+----------------+-------------+ LEFT    CompressibilityPhasicitySpontaneityProperties      Thrombus                                                                 Aging         +--------+---------------+---------+-----------+----------------+-------------+  CFV     Full                               pulsatile                                                                waveforms                     +--------+---------------+---------+-----------+----------------+-------------+ SFJ     Full                                                             +--------+---------------+---------+-----------+----------------+-------------+ FV Prox Full                                                             +--------+---------------+---------+-----------+----------------+-------------+ FV Mid  Full                                                             +--------+---------------+---------+-----------+----------------+-------------+ FV      Full                                                             Distal                                                                   +--------+---------------+---------+-----------+----------------+-------------+ PFV     Full                                                             +--------+---------------+---------+-----------+----------------+-------------+ POP     Full                               pulsatile  waveforms                     +--------+---------------+---------+-----------+----------------+-------------+ PTV     Full                                                             +--------+---------------+---------+-----------+----------------+-------------+ PERO    Full                                                              +--------+---------------+---------+-----------+----------------+-------------+     Summary: BILATERAL: - No evidence of deep vein thrombosis seen in the lower extremities, bilaterally. -No evidence of popliteal cyst, bilaterally. RIGHT: - Findings appear improved from previous examination. pulsatile waveforms suggestive of fluid overload  LEFT: Pulsatile waveforms suggestive of fluid overload.  *See table(s) above for measurements and observations. Electronically signed by Orlie Pollen on 09/22/2021 at 5:19:40 PM.    Final     Microbiology: Results for orders placed or performed during the hospital encounter of 09/22/21  Resp Panel by RT-PCR (Flu A&B, Covid) Anterior Nasal Swab     Status: None   Collection Time: 09/22/21  8:54 AM   Specimen: Anterior Nasal Swab  Result Value Ref Range Status   SARS Coronavirus 2 by RT PCR NEGATIVE NEGATIVE Final    Comment: (NOTE) SARS-CoV-2 target nucleic acids are NOT DETECTED.  The SARS-CoV-2 RNA is generally detectable in upper respiratory specimens during the acute phase of infection. The lowest concentration of SARS-CoV-2 viral copies this assay can detect is 138 copies/mL. A negative result does not preclude SARS-Cov-2 infection and should not be used as the sole basis for treatment or other patient management decisions. A negative result may occur with  improper specimen collection/handling, submission of specimen other than nasopharyngeal swab, presence of viral mutation(s) within the areas targeted by this assay, and inadequate number of viral copies(<138 copies/mL). A negative result must be combined with clinical observations, patient history, and epidemiological information. The expected result is Negative.  Fact Sheet for Patients:  EntrepreneurPulse.com.au  Fact Sheet for Healthcare Providers:  IncredibleEmployment.be  This test is no t yet approved  or cleared by the [place] FDA and  has been authorized for detection and/or diagnosis of SARS-CoV-2 by FDA under an Emergency Use Authorization (EUA). This EUA will remain  in effect (meaning this test can be used) for the duration of the COVID-19 declaration under Section 564(b)(1) of the Act, 21 U.S.C.section 360bbb-3(b)(1), unless the authorization is terminated  or revoked sooner.       Influenza A by PCR NEGATIVE NEGATIVE Final   Influenza B by PCR NEGATIVE NEGATIVE Final    Comment: (NOTE) The Xpert Xpress SARS-CoV-2/FLU/RSV plus assay is intended as an aid in the diagnosis of influenza from Nasopharyngeal swab specimens and should not be used as a sole basis for treatment. Nasal washings and aspirates are unacceptable for Xpert Xpress SARS-CoV-2/FLU/RSV testing.  Fact Sheet for Patients: EntrepreneurPulse.com.au  Fact Sheet for Healthcare Providers: IncredibleEmployment.be  This test is not yet approved or cleared by the [place] and has been authorized for  detection and/or diagnosis of SARS-CoV-2 by FDA under an Emergency Use Authorization (EUA). This EUA will remain in effect (meaning this test can be used) for the duration of the COVID-19 declaration under Section 564(b)(1) of the Act, 21 U.S.C. section 360bbb-3(b)(1), unless the authorization is terminated or revoked.  Performed at [facility], [place] [address], [place], [place] [address]     Labs: CBC: Recent Labs  Lab 09/22/21 0730 09/23/21 0437 09/24/21 0723 09/25/21 0442  WBC 7.4 7.8 7.5 8.0  NEUTROABS 5.4  --   --   --   HGB 9.1* 9.5* 10.3* 10.2*  HCT 29.2* 29.3* 31.5* 31.6*  MCV 100.0 96.1 96.0 96.6  PLT 217 226 240 675   Basic Metabolic Panel: Recent Labs  Lab 09/22/21 0730 09/22/21 1320 09/23/21 0437 09/24/21 0723 09/25/21 0442  NA 141  --  140 143 141  K 4.1  --  3.6 3.4* 3.7  CL 114*  --  110 109 106  CO2 18*  --  _0 GLUCOSE 89 114* 81 91 86  BUN 57*  --  52* 56* 58*  CREATININE 3.09*  --  2.97* 2.84* 2.78*  CALCIUM 8.7*  --  8.6* 8.7* 8.7*  MG  --   --   --   --  1.7  PHOS  --   --   --  4.8*  --    Liver Function Tests: Recent Labs  Lab 09/22/21 0730 09/24/21 0723  AST 14*  --   ALT 16  --   ALKPHOS 44  --   BILITOT 0.5  --   PROT 5.7*  --   ALBUMIN 3.2* 3.0*   CBG: Recent Labs  Lab 09/24/21 1104 09/24/21 1527 09/24/21 2117 09/25/21 0602 09/25/21 1051  GLUCAP 97 103* 118* 87 83    Discharge time spent: greater than 30 minutes.  Signed: Flora Lipps, MD [facility] 09/25/2021

## 2021-09-25 NOTE — TOC Transition Note (Signed)
Transition of Care [facility]) - CM/SW Discharge Note   Patient Details  Name: Meghan Wise MRN: [id number] Date of Birth: [birth year]  Transition of Care [facility] CM/SW Contact:  Carles Collet, RN Phone Number: 09/25/2021, 11:18 AM   Clinical Narrative:   Notified [facility] that patient will DC today.  No DME needs, daughter will transport home.     Final next level of care: [place] Barriers to Discharge: No Barriers Identified   Patient Goals and CMS Choice Patient states their goals for this hospitalization and ongoing recovery are:: to return to home CMS Medicare.gov Compare Post Acute Care list provided to:: Patient Choice offered to / list presented to : Patient  Discharge Placement                       Discharge Plan and Services   Discharge Planning Services: CM Consult Post Acute Care Choice: Home Health          DME Arranged: N/A         HH Arranged: PT HH Agency: [place] Date [place]: 09/25/21 Time [place]: 1118 Representative spoke with at [place]: [place] ([place]) Interventions     Readmission Risk Interventions    06/05/2021   10:09 AM  Readmission Risk Prevention Plan  Transportation Screening Complete  PCP or Specialist Appt within 3-5 Days Complete  HRI or [place] Complete  Social Work Consult for [place] Planning/Counseling Complete  Palliative Care Screening Not Applicable  Medication Review Press photographer) Complete

## 2021-09-25 NOTE — Progress Notes (Signed)
[facility] Progress Note    Assessment/ Plan:   AKI on CKD4 - AKI Appears secondary to cardiorenal syndrome. Baseline Cr 1.7-2.0  - note that she is a poor dialysis candidate and has also elected not to pursue dialysis should her renal function worsen - she has reinforced this again upon our discussion on 6/2 -Cr relatively stable/unchanged today. Stopping lasix IV and transitioning to torsemide 13m daily. Will arrange for outpatient follow up at the office. Would be okay with discharge from a nephrology perspective. - strict ins/outs, daily weights    Acute on Chronic hypoxic resp failure  - improving, transitioning lasix to torsemide as above   HTN  - Controlled on current regimen     Metabolic acidosis - setting of AKI and overload,resolved    Macrocytic anemia, anemia of CKD, h/o GIB - has been receiving IV iron, last dose was Venofer 2051mon 5/9. Per patient and daughter, was supposed to receive another dose. S/p venofer 6/2, iron panel=iron deficient  Subjective:   No acute events overnight. Patient reports that she feels like she is back to her baseline from a volume standpoint. No longer having PND. Denies any chest pain, SOB, orthopnea, dizziness.   Objective:   BP (!) 124/51 (BP Location: Left Arm)   Pulse 63   Temp 98.4 F (36.9 C) (Oral)   Resp 18   Ht _0  (1.473 m)   Wt 39.1 kg   SpO2 100%   BMI 18.02 kg/m   Intake/Output Summary (Last 24 hours) at 09/25/2021 0802 Last data filed at 09/25/2021 0430 Gross per 24 hour  Intake 200 ml  Output 1100 ml  Net -900 ml   Weight change: -0.5 kg  Physical Exam: Gen:NAD, sitting up at edge of bed CVS:s1s2, rrr Resp:cta bl AbTGY:BWLSxt: trace pitting edema (lt>rt)--improving Neuro: awake, alert  Imaging: No results found.  Labs: BMET Recent Labs  Lab 09/22/21 0730 09/22/21 1320 09/23/21 0437 09/24/21 0723 09/25/21 0442  NA 141  --  140 143 141  K 4.1  --  3.6 3.4* 3.7  CL 114*   --  110 109 106  CO2 18*  --  _1 GLUCOSE 89 114* 81 91 86  BUN 57*  --  52* 56* 58*  CREATININE 3.09*  --  2.97* 2.84* 2.78*  CALCIUM 8.7*  --  8.6* 8.7* 8.7*  PHOS  --   --   --  4.8*  --    CBC Recent Labs  Lab 09/22/21 0730 09/23/21 0437 09/24/21 0723 09/25/21 0442  WBC 7.4 7.8 7.5 8.0  NEUTROABS 5.4  --   --   --   HGB 9.1* 9.5* 10.3* 10.2*  HCT 29.2* 29.3* 31.5* 31.6*  MCV 100.0 96.1 96.0 96.6  PLT 217 226 240 227    Medications:     amiodarone  200 mg Oral Daily   atorvastatin  20 mg Oral Daily   docusate sodium  100 mg Oral BID   enoxaparin (LOVENOX) injection  30 mg Subcutaneous Q24H   feeding supplement  237 mL Oral BID BM   insulin aspart  0-6 Units Subcutaneous TID WC   levothyroxine  75 mcg Oral Q0600   macitentan  10 mg Oral Daily   pantoprazole  40 mg Oral BID   predniSONE  5 mg Oral Q breakfast   sodium chloride flush  3 mL Intravenous Q12H   tadalafil  20 mg Oral Daily   torsemide  20  mg Oral Daily      Gean Quint, MD [facility] 09/25/2021, 8:02 AM

## 2021-09-26 ENCOUNTER — Encounter (HOSPITAL_COMMUNITY): Payer: Medicare PPO

## 2021-09-29 DIAGNOSIS — I129 Hypertensive chronic kidney disease with stage 1 through stage 4 chronic kidney disease, or unspecified chronic kidney disease: Secondary | ICD-10-CM | POA: Diagnosis not present

## 2021-09-29 DIAGNOSIS — N2581 Secondary hyperparathyroidism of renal origin: Secondary | ICD-10-CM | POA: Diagnosis not present

## 2021-09-29 DIAGNOSIS — I071 Rheumatic tricuspid insufficiency: Secondary | ICD-10-CM | POA: Diagnosis not present

## 2021-09-29 DIAGNOSIS — D869 Sarcoidosis, unspecified: Secondary | ICD-10-CM | POA: Diagnosis not present

## 2021-09-29 DIAGNOSIS — N179 Acute kidney failure, unspecified: Secondary | ICD-10-CM | POA: Diagnosis not present

## 2021-09-29 DIAGNOSIS — I5030 Unspecified diastolic (congestive) heart failure: Secondary | ICD-10-CM | POA: Diagnosis not present

## 2021-09-29 DIAGNOSIS — D649 Anemia, unspecified: Secondary | ICD-10-CM | POA: Diagnosis not present

## 2021-09-29 DIAGNOSIS — I272 Pulmonary hypertension, unspecified: Secondary | ICD-10-CM | POA: Diagnosis not present

## 2021-09-29 DIAGNOSIS — N184 Chronic kidney disease, stage 4 (severe): Secondary | ICD-10-CM | POA: Diagnosis not present

## 2021-09-30 DIAGNOSIS — M810 Age-related osteoporosis without current pathological fracture: Secondary | ICD-10-CM | POA: Diagnosis not present

## 2021-09-30 DIAGNOSIS — J9691 Respiratory failure, unspecified with hypoxia: Secondary | ICD-10-CM | POA: Diagnosis not present

## 2021-09-30 DIAGNOSIS — E8770 Fluid overload, unspecified: Secondary | ICD-10-CM | POA: Diagnosis not present

## 2021-09-30 DIAGNOSIS — I509 Heart failure, unspecified: Secondary | ICD-10-CM | POA: Diagnosis not present

## 2021-09-30 DIAGNOSIS — N189 Chronic kidney disease, unspecified: Secondary | ICD-10-CM | POA: Diagnosis not present

## 2021-09-30 DIAGNOSIS — E039 Hypothyroidism, unspecified: Secondary | ICD-10-CM | POA: Diagnosis not present

## 2021-10-07 DIAGNOSIS — N184 Chronic kidney disease, stage 4 (severe): Secondary | ICD-10-CM | POA: Diagnosis not present

## 2021-10-12 ENCOUNTER — Emergency Department (HOSPITAL_COMMUNITY): Payer: Medicare PPO

## 2021-10-12 ENCOUNTER — Encounter (HOSPITAL_COMMUNITY): Payer: Self-pay | Admitting: Emergency Medicine

## 2021-10-12 ENCOUNTER — Inpatient Hospital Stay (HOSPITAL_COMMUNITY)
Admission: EM | Admit: 2021-10-12 | Discharge: 2021-10-22 | DRG: 951 | Disposition: E | Payer: Medicare PPO | Attending: Internal Medicine | Admitting: Internal Medicine

## 2021-10-12 ENCOUNTER — Other Ambulatory Visit: Payer: Self-pay

## 2021-10-12 DIAGNOSIS — M25561 Pain in right knee: Secondary | ICD-10-CM | POA: Diagnosis present

## 2021-10-12 DIAGNOSIS — E860 Dehydration: Secondary | ICD-10-CM | POA: Diagnosis not present

## 2021-10-12 DIAGNOSIS — I959 Hypotension, unspecified: Secondary | ICD-10-CM | POA: Diagnosis not present

## 2021-10-12 DIAGNOSIS — Z823 Family history of stroke: Secondary | ICD-10-CM

## 2021-10-12 DIAGNOSIS — R0902 Hypoxemia: Secondary | ICD-10-CM | POA: Diagnosis not present

## 2021-10-12 DIAGNOSIS — K922 Gastrointestinal hemorrhage, unspecified: Secondary | ICD-10-CM | POA: Diagnosis not present

## 2021-10-12 DIAGNOSIS — Z7189 Other specified counseling: Secondary | ICD-10-CM | POA: Diagnosis not present

## 2021-10-12 DIAGNOSIS — N184 Chronic kidney disease, stage 4 (severe): Secondary | ICD-10-CM | POA: Diagnosis present

## 2021-10-12 DIAGNOSIS — Z79890 Hormone replacement therapy (postmenopausal): Secondary | ICD-10-CM

## 2021-10-12 DIAGNOSIS — Z888 Allergy status to other drugs, medicaments and biological substances status: Secondary | ICD-10-CM

## 2021-10-12 DIAGNOSIS — Z882 Allergy status to sulfonamides status: Secondary | ICD-10-CM

## 2021-10-12 DIAGNOSIS — I7300 Raynaud's syndrome without gangrene: Secondary | ICD-10-CM | POA: Diagnosis present

## 2021-10-12 DIAGNOSIS — R112 Nausea with vomiting, unspecified: Secondary | ICD-10-CM | POA: Diagnosis not present

## 2021-10-12 DIAGNOSIS — Z66 Do not resuscitate: Secondary | ICD-10-CM | POA: Diagnosis not present

## 2021-10-12 DIAGNOSIS — I272 Pulmonary hypertension, unspecified: Secondary | ICD-10-CM | POA: Diagnosis not present

## 2021-10-12 DIAGNOSIS — R9431 Abnormal electrocardiogram [ECG] [EKG]: Secondary | ICD-10-CM | POA: Diagnosis not present

## 2021-10-12 DIAGNOSIS — M810 Age-related osteoporosis without current pathological fracture: Secondary | ICD-10-CM | POA: Diagnosis present

## 2021-10-12 DIAGNOSIS — I4891 Unspecified atrial fibrillation: Secondary | ICD-10-CM | POA: Diagnosis present

## 2021-10-12 DIAGNOSIS — I130 Hypertensive heart and chronic kidney disease with heart failure and stage 1 through stage 4 chronic kidney disease, or unspecified chronic kidney disease: Secondary | ICD-10-CM | POA: Diagnosis present

## 2021-10-12 DIAGNOSIS — R197 Diarrhea, unspecified: Secondary | ICD-10-CM | POA: Diagnosis present

## 2021-10-12 DIAGNOSIS — E039 Hypothyroidism, unspecified: Secondary | ICD-10-CM | POA: Diagnosis present

## 2021-10-12 DIAGNOSIS — Z95828 Presence of other vascular implants and grafts: Secondary | ICD-10-CM

## 2021-10-12 DIAGNOSIS — Z7901 Long term (current) use of anticoagulants: Secondary | ICD-10-CM

## 2021-10-12 DIAGNOSIS — E785 Hyperlipidemia, unspecified: Secondary | ICD-10-CM | POA: Diagnosis present

## 2021-10-12 DIAGNOSIS — Z515 Encounter for palliative care: Principal | ICD-10-CM

## 2021-10-12 DIAGNOSIS — I5032 Chronic diastolic (congestive) heart failure: Secondary | ICD-10-CM | POA: Diagnosis not present

## 2021-10-12 DIAGNOSIS — R600 Localized edema: Secondary | ICD-10-CM | POA: Diagnosis not present

## 2021-10-12 DIAGNOSIS — J9611 Chronic respiratory failure with hypoxia: Secondary | ICD-10-CM | POA: Diagnosis not present

## 2021-10-12 DIAGNOSIS — J8489 Other specified interstitial pulmonary diseases: Secondary | ICD-10-CM | POA: Diagnosis not present

## 2021-10-12 DIAGNOSIS — R531 Weakness: Secondary | ICD-10-CM | POA: Diagnosis not present

## 2021-10-12 DIAGNOSIS — Z833 Family history of diabetes mellitus: Secondary | ICD-10-CM

## 2021-10-12 DIAGNOSIS — Z681 Body mass index (BMI) 19 or less, adult: Secondary | ICD-10-CM | POA: Diagnosis not present

## 2021-10-12 DIAGNOSIS — Z86718 Personal history of other venous thrombosis and embolism: Secondary | ICD-10-CM | POA: Diagnosis not present

## 2021-10-12 DIAGNOSIS — M25511 Pain in right shoulder: Secondary | ICD-10-CM | POA: Diagnosis present

## 2021-10-12 DIAGNOSIS — M3481 Systemic sclerosis with lung involvement: Secondary | ICD-10-CM | POA: Diagnosis present

## 2021-10-12 DIAGNOSIS — Z9049 Acquired absence of other specified parts of digestive tract: Secondary | ICD-10-CM

## 2021-10-12 DIAGNOSIS — R638 Other symptoms and signs concerning food and fluid intake: Secondary | ICD-10-CM | POA: Diagnosis not present

## 2021-10-12 DIAGNOSIS — E43 Unspecified severe protein-calorie malnutrition: Secondary | ICD-10-CM | POA: Diagnosis not present

## 2021-10-12 DIAGNOSIS — Z87891 Personal history of nicotine dependence: Secondary | ICD-10-CM

## 2021-10-12 DIAGNOSIS — Z79899 Other long term (current) drug therapy: Secondary | ICD-10-CM

## 2021-10-12 DIAGNOSIS — M47812 Spondylosis without myelopathy or radiculopathy, cervical region: Secondary | ICD-10-CM | POA: Diagnosis not present

## 2021-10-12 DIAGNOSIS — M25751 Osteophyte, right hip: Secondary | ICD-10-CM | POA: Diagnosis not present

## 2021-10-12 DIAGNOSIS — M1611 Unilateral primary osteoarthritis, right hip: Secondary | ICD-10-CM | POA: Diagnosis not present

## 2021-10-12 DIAGNOSIS — S0990XA Unspecified injury of head, initial encounter: Secondary | ICD-10-CM | POA: Diagnosis not present

## 2021-10-12 DIAGNOSIS — M1990 Unspecified osteoarthritis, unspecified site: Secondary | ICD-10-CM | POA: Diagnosis present

## 2021-10-12 DIAGNOSIS — Z885 Allergy status to narcotic agent status: Secondary | ICD-10-CM

## 2021-10-12 DIAGNOSIS — Z8249 Family history of ischemic heart disease and other diseases of the circulatory system: Secondary | ICD-10-CM

## 2021-10-12 DIAGNOSIS — M349 Systemic sclerosis, unspecified: Secondary | ICD-10-CM | POA: Diagnosis present

## 2021-10-12 DIAGNOSIS — K921 Melena: Secondary | ICD-10-CM | POA: Diagnosis present

## 2021-10-12 DIAGNOSIS — R54 Age-related physical debility: Secondary | ICD-10-CM | POA: Diagnosis present

## 2021-10-12 DIAGNOSIS — Z789 Other specified health status: Secondary | ICD-10-CM | POA: Diagnosis not present

## 2021-10-12 DIAGNOSIS — Z886 Allergy status to analgesic agent status: Secondary | ICD-10-CM

## 2021-10-12 DIAGNOSIS — Z9181 History of falling: Secondary | ICD-10-CM

## 2021-10-12 DIAGNOSIS — Z9981 Dependence on supplemental oxygen: Secondary | ICD-10-CM

## 2021-10-12 LAB — TYPE AND SCREEN
ABO/RH(D): A POS
Antibody Screen: NEGATIVE

## 2021-10-12 LAB — CBC WITH DIFFERENTIAL/PLATELET
Abs Immature Granulocytes: 0.02 10*3/uL (ref 0.00–0.07)
Basophils Absolute: 0 10*3/uL (ref 0.0–0.1)
Basophils Relative: 0 %
Eosinophils Absolute: 0.1 10*3/uL (ref 0.0–0.5)
Eosinophils Relative: 1 %
HCT: 32.3 % — ABNORMAL LOW (ref 36.0–46.0)
Hemoglobin: 10.4 g/dL — ABNORMAL LOW (ref 12.0–15.0)
Immature Granulocytes: 0 %
Lymphocytes Relative: 7 %
Lymphs Abs: 0.6 10*3/uL — ABNORMAL LOW (ref 0.7–4.0)
MCH: 32.1 pg (ref 26.0–34.0)
MCHC: 32.2 g/dL (ref 30.0–36.0)
MCV: 99.7 fL (ref 80.0–100.0)
Monocytes Absolute: 0.7 10*3/uL (ref 0.1–1.0)
Monocytes Relative: 9 %
Neutro Abs: 6.8 10*3/uL (ref 1.7–7.7)
Neutrophils Relative %: 83 %
Platelets: 249 10*3/uL (ref 150–400)
RBC: 3.24 MIL/uL — ABNORMAL LOW (ref 3.87–5.11)
RDW: 18 % — ABNORMAL HIGH (ref 11.5–15.5)
WBC: 8.3 10*3/uL (ref 4.0–10.5)
nRBC: 0 % (ref 0.0–0.2)

## 2021-10-12 LAB — COMPREHENSIVE METABOLIC PANEL
ALT: 29 U/L (ref 0–44)
AST: 28 U/L (ref 15–41)
Albumin: 3.5 g/dL (ref 3.5–5.0)
Alkaline Phosphatase: 72 U/L (ref 38–126)
Anion gap: 18 — ABNORMAL HIGH (ref 5–15)
BUN: 81 mg/dL — ABNORMAL HIGH (ref 8–23)
CO2: 17 mmol/L — ABNORMAL LOW (ref 22–32)
Calcium: 6.1 mg/dL — CL (ref 8.9–10.3)
Chloride: 104 mmol/L (ref 98–111)
Creatinine, Ser: 3.07 mg/dL — ABNORMAL HIGH (ref 0.44–1.00)
GFR, Estimated: 14 mL/min — ABNORMAL LOW (ref >60)
Glucose, Bld: 83 mg/dL (ref 70–99)
Potassium: 4.3 mmol/L (ref 3.5–5.1)
Sodium: 139 mmol/L (ref 135–145)
Total Bilirubin: 0.6 mg/dL (ref 0.3–1.2)
Total Protein: 6.8 g/dL (ref 6.5–8.1)

## 2021-10-12 LAB — LACTIC ACID, PLASMA: Lactic Acid, Venous: 0.7 mmol/L (ref 0.5–1.9)

## 2021-10-12 LAB — TROPONIN I (HIGH SENSITIVITY)
Troponin I (High Sensitivity): 29 ng/L — ABNORMAL HIGH (ref <18)
Troponin I (High Sensitivity): 28 ng/L — ABNORMAL HIGH (ref <18)

## 2021-10-12 LAB — POC OCCULT BLOOD, ED: Fecal Occult Bld: POSITIVE — AB

## 2021-10-12 LAB — BRAIN NATRIURETIC PEPTIDE: B Natriuretic Peptide: 1083.7 pg/mL — ABNORMAL HIGH (ref 0.0–100.0)

## 2021-10-12 MED ORDER — ACETAMINOPHEN 325 MG PO TABS: 650.0000 mg | ORAL_TABLET | Freq: Four times a day (QID) | ORAL | Status: DC | PRN

## 2021-10-12 MED ORDER — HYDROMORPHONE HCL 1 MG/ML IJ SOLN: 0.5000 mg | INTRAMUSCULAR | Status: DC | PRN

## 2021-10-12 MED ORDER — MORPHINE 100MG IN NS 100ML (1MG/ML) PREMIX INFUSION
5.0000 mg/h | INTRAVENOUS | Status: DC
  Administered 2021-10-12: 5 mg/h via INTRAVENOUS
  Filled 2021-10-12: qty 100

## 2021-10-12 MED ORDER — PANTOPRAZOLE INFUSION (NEW) - SIMPLE MED
8.0000 mg/h | INTRAVENOUS | Status: DC
  Administered 2021-10-12: 8 mg/h via INTRAVENOUS
  Filled 2021-10-12: qty 80

## 2021-10-12 MED ORDER — ACETAMINOPHEN 650 MG RE SUPP: 650.0000 mg | Freq: Four times a day (QID) | RECTAL | Status: DC | PRN

## 2021-10-12 MED ORDER — LORAZEPAM 1 MG PO TABS
1.0000 mg | ORAL_TABLET | ORAL | Status: DC | PRN
  Administered 2021-10-14: 1 mg via ORAL
  Filled 2021-10-12: qty 1

## 2021-10-12 MED ORDER — LORAZEPAM 2 MG/ML PO CONC: 1.0000 mg | ORAL | Status: DC | PRN

## 2021-10-12 MED ORDER — CALCIUM GLUCONATE-NACL 1-0.675 GM/50ML-% IV SOLN
1.0000 g | Freq: Once | INTRAVENOUS | Status: AC
  Administered 2021-10-12: 1000 mg via INTRAVENOUS
  Filled 2021-10-12: qty 50

## 2021-10-12 MED ORDER — GLYCOPYRROLATE 0.2 MG/ML IJ SOLN: 0.2000 mg | INTRAMUSCULAR | Status: DC | PRN

## 2021-10-12 MED ORDER — LORAZEPAM 2 MG/ML IJ SOLN
1.0000 mg | INTRAMUSCULAR | Status: DC | PRN
  Administered 2021-10-12 – 2021-10-14 (×2): 1 mg via INTRAVENOUS
  Filled 2021-10-12 (×2): qty 1

## 2021-10-12 MED ORDER — DIPHENHYDRAMINE HCL 50 MG/ML IJ SOLN: 12.5000 mg | INTRAMUSCULAR | Status: DC | PRN

## 2021-10-12 MED ORDER — POLYVINYL ALCOHOL 1.4 % OP SOLN
1.0000 [drp] | Freq: Four times a day (QID) | OPHTHALMIC | Status: DC | PRN
  Filled 2021-10-12: qty 15

## 2021-10-12 MED ORDER — ONDANSETRON 4 MG PO TBDP: 4.0000 mg | ORAL_TABLET | Freq: Four times a day (QID) | ORAL | Status: DC | PRN

## 2021-10-12 MED ORDER — MORPHINE BOLUS VIA INFUSION
2.0000 mg | INTRAVENOUS | Status: DC | PRN
  Administered 2021-10-12: 2 mg via INTRAVENOUS

## 2021-10-12 MED ORDER — BIOTENE DRY MOUTH MT LIQD
15.0000 mL | OROMUCOSAL | Status: DC | PRN
Start: 2021-10-12 — End: 2021-10-15

## 2021-10-12 MED ORDER — SODIUM CHLORIDE 0.9 % IV SOLN
0.5000 mg/h | INTRAVENOUS | Status: DC
  Filled 2021-10-12: qty 2.5

## 2021-10-12 MED ORDER — GLYCOPYRROLATE 1 MG PO TABS: 1.0000 mg | ORAL_TABLET | ORAL | Status: DC | PRN

## 2021-10-12 MED ORDER — HALOPERIDOL LACTATE 5 MG/ML IJ SOLN: 0.5000 mg | INTRAMUSCULAR | Status: DC | PRN

## 2021-10-12 MED ORDER — MORPHINE 100MG IN NS 100ML (1MG/ML) PREMIX INFUSION
5.0000 mg/h | INTRAVENOUS | Status: DC
  Administered 2021-10-12 – 2021-10-14 (×3): 5 mg/h via INTRAVENOUS
  Filled 2021-10-12 (×2): qty 100

## 2021-10-12 MED ORDER — HALOPERIDOL 0.5 MG PO TABS: 0.5000 mg | ORAL_TABLET | ORAL | Status: DC | PRN

## 2021-10-12 MED ORDER — MORPHINE BOLUS VIA INFUSION
2.0000 mg | INTRAVENOUS | Status: DC | PRN
  Administered 2021-10-12 (×2): 2 mg via INTRAVENOUS

## 2021-10-12 MED ORDER — HALOPERIDOL LACTATE 2 MG/ML PO CONC: 0.5000 mg | ORAL | Status: DC | PRN

## 2021-10-12 MED ORDER — ONDANSETRON HCL 4 MG/2ML IJ SOLN
4.0000 mg | Freq: Four times a day (QID) | INTRAMUSCULAR | Status: DC | PRN
  Administered 2021-10-12: 4 mg via INTRAVENOUS
  Filled 2021-10-12: qty 2

## 2021-10-12 MED ORDER — PANTOPRAZOLE 80MG IVPB - SIMPLE MED
80.0000 mg | Freq: Once | INTRAVENOUS | Status: AC
  Administered 2021-10-12: 80 mg via INTRAVENOUS
  Filled 2021-10-12: qty 80

## 2021-10-12 NOTE — H&P (Addendum)
History and Physical    Patient: Meghan Wise LFY:[id number] DOB: 1936-03-26 DOA: 10/06/2021 DOS: the patient was seen and examined on 10/11/2021 PCP: Collene Leyden, MD  Patient coming from: [facility] - [name]; NOK: Daughter, [name], [phone]   Chief Complaint: [name] stools  HPI: Meghan Wise is a 86 y.o. female with medical history significant of HTN; HLD; DVT (02/2021) s/p IVC filter placement; afib on Eliquis; hypothyroidism; pulmonary HTN on 4-5L home O2; stage 4 CKD; and scleroderma presenting with SOB.  She was last admitted from 6/1-4 for cardiorenal syndrome with acute on chronic diastolic CHF, not a candidate for HD.  She was also previously admitted from 2/6-12 for acute on chronic respiratory failure with worsened anemia in the setting of acute GI bleeding on Eliquis.  She reports that since she has been home she just has gotten weaker.  She doesn't want to go on living like this if she is just going on this way.  She noticed tarry stools a couple of days ago.  She has had n/v and overall thinks she is ready to just be comfortable.  I spoke with her daughter, [name].  She has not been doing well since her last hospitalization.  She has not been having trouble breathing.  She had been on Cipro for bacterial overgrowth, started having diarrhea.  She felt great at the end of the hospital stay and this lasted a day or two and had gotten weaker.  She got nauseated and stayed that way until she got Zofran, "eating it like candy."  She said her kidney values were worse.  They had an appointment with nephrology a week or ago, canceled it due to nausea.  She has been through end of life issues with her cats over time.  Her daughter has been waiting for her to say she was ready for end of life care.  She feels like they have gone on long enough.  She doesn't want/can't do HD.      ER Course:  Repeat GI bleeding.  On baseline O2.  Low calcium repleting.  Tarry stools, nonbloody  emesis.  Dr. Collene Mares will see, suggest Protonix.  Not a prior candidate for endoscopy, ?now.  No longer on Eliquis.     Review of Systems: As mentioned in the history of present illness. All other systems reviewed and are negative. Past Medical History:  Diagnosis Date   Heart failure ([place])    HTN (hypertension)    Hyperlipidemia    Hypothyroidism    Kidney disease    Stage IV- Dr. Servando Salina -LOV 02-11-14   Osteoarthritis    Osteoporosis    Pulmonary hypertension (HCC)    Raynaud disease    reactive with cold and stress mostly fingers.   Scleroderma ([place])    lungs and kidneys   Shortness of breath dyspnea    with exertion-in Cardiac rehab program at [facility].   Past Surgical History:  Procedure Laterality Date   BREAST BIOPSY Bilateral    x    CARDIAC CATHETERIZATION     2'15 [facility]   CHOLECYSTECTOMY     CHOLECYSTECTOMY, LAPAROSCOPIC     COLONOSCOPY WITH PROPOFOL N/A 05/07/2014   Procedure: COLONOSCOPY WITH PROPOFOL;  Surgeon: Inda Castle, MD;  Location: [facility] ENDOSCOPY;  Service: Endoscopy;  Laterality: N/A;   ESOPHAGOGASTRODUODENOSCOPY (EGD) WITH PROPOFOL N/A 05/07/2014   Procedure: ESOPHAGOGASTRODUODENOSCOPY (EGD) WITH PROPOFOL;  Surgeon: Inda Castle, MD;  Location: [facility];  Service: Endoscopy;  Laterality: N/A;  HOT HEMOSTASIS N/A 05/07/2014   Procedure: HOT HEMOSTASIS (ARGON PLASMA COAGULATION/BICAP);  Surgeon: Inda Castle, MD;  Location: [facility];  Service: Endoscopy;  Laterality: N/A;  deuodenal bulb   IR IVC FILTER PLMT / S&I /IMG GUID/MOD SED  06/02/2021   RIGHT HEART CATH N/A 06/01/2021   Procedure: RIGHT HEART CATH;  Surgeon: Larey Dresser, MD;  Location: [facility];  Service: Cardiovascular;  Laterality: N/A;   SHOULDER ARTHROSCOPY W/ ROTATOR CUFF REPAIR Right    Social History:  reports that she quit smoking about 34 years ago. Her smoking use included cigarettes. She has a 90.00 pack-year smoking history. She has never used smokeless tobacco.  She reports current alcohol use. She reports that she does not use drugs.  Allergies  Allergen Reactions   Cozaar [Losartan] Other (See Comments) and Cough    High calcium count and renal problems   Nsaids     Other reaction(s): Kidney Disorder   Ace Inhibitors Other (See Comments) and Cough    Dizziness and coughing    Sulfa Antibiotics Hives   Codeine Nausea Only   Heparin Anxiety and Other (See Comments)    Pt reports that they have a sensitivity towards heparin. Pt becomes depressed and anxious to the point of crying uncontrollably.     Family History  Problem Relation Age of Onset   Heart disease Mother    Heart disease Father    Cancer Other        husband--esophageal   Diabetes Son    Stroke Daughter    Breast cancer Neg Hx     Prior to Admission medications   Medication Sig Start Date End Date Taking? Authorizing Provider  Acetaminophen 500 MG capsule Take 1,000 mg by mouth every 6 (six) hours as needed for fever.    [provider]  amiodarone (PACERONE) 200 MG tablet Take 1 tablet (200 mg total) by mouth 2 (two) times daily for 4 days, THEN 1 tablet (200 mg total) daily. Patient taking differently: 200 mg daily 06/05/21 11/26/21  Elgergawy, Silver Huguenin, MD  atorvastatin (LIPITOR) 20 MG tablet Take 20 mg by mouth daily.    [provider]  Cyanocobalamin (VITAMIN B-12 PO) Take 1 tablet by mouth every other day.    [provider]  denosumab (PROLIA) 60 MG/ML SOSY injection Inject 60 mg into the skin every 6 (six) months.    [provider]  feeding supplement (ENSURE ENLIVE / ENSURE PLUS) LIQD Take 237 mLs by mouth 2 (two) times daily between meals. 09/25/21   Pokhrel, Laxman, MD  ferrous sulfate 324 MG TBEC Take 324 mg by mouth daily.    [provider]  folic acid (FOLVITE) 559 MCG tablet Take 400 mcg by mouth daily.    [provider]  levothyroxine (SYNTHROID) 75 MCG tablet Take 1 tablet (75 mcg total) by mouth every  morning. 09/25/21 09/25/22  Pokhrel, Corrie Mckusick, MD  loperamide (IMODIUM A-D) 2 MG tablet Take 4 mg by mouth 4 (four) times daily as needed for diarrhea or loose stools.     [provider]  Melatonin 10 MG TABS Take 10 mg by mouth daily as needed (sleep).     [provider]  OPSUMIT 10 MG tablet Take 10 mg by mouth daily. 09/10/21   [provider]  OXYGEN Inhale 2-4 L into the lungs continuous. 4 L with exertion 2 L at night    [provider]  pantoprazole (PROTONIX) 40 MG  tablet Take 1 tablet (40 mg total) by mouth 2 (two) times daily. 06/05/21   Elgergawy, Silver Huguenin, MD  predniSONE (DELTASONE) 5 MG tablet Take 5 mg by mouth daily with breakfast. Continuously    [provider]  tadalafil, PAH, (ADCIRCA) 20 MG tablet Take 20 mg by mouth daily. 09/02/21   [provider]  torsemide (DEMADEX) 10 MG tablet Take 2 tablets (20 mg total) by mouth daily. 09/25/21 09/25/22  Flora Lipps, MD    Physical Exam: Vitals:   10/07/2021 1630 09/30/2021 1645 10/07/2021 1715 09/25/2021 1730  BP: (!) 122/58 (!) 126/51 (!) 118/54 (!) 114/53  Pulse: 60 60 (!) 58 (!) 55  Resp: (!) 29 (!) 30 (!) 24 (!) 24  Temp:      TempSrc:      SpO2: 96% 98% 95% 94%  Weight:      Height:       General:  Appears thin, frail, chronically ill Eyes:  PERRL, EOMI, normal lids, iris ENT:  grossly normal hearing, lips & tongue, mmm Neck:  no LAD, masses or thyromegaly Cardiovascular:  RRR, no r/g. 3/6 systolic murmur.  2-3+ LE edema.  Respiratory:   CTA bilaterally with no wheezes/rales/rhonchi.  Mildly increased respiratory effort. Abdomen:  soft, NT, ND Skin:  no rash or induration seen on limited exam Musculoskeletal:  grossly normal tone BUE/BLE, good ROM, no bony abnormality Psychiatric:  blunted mood and affect, speech fluent and appropriate, AOx3 Neurologic:  CN 2-12 grossly intact, moves all extremities in coordinated fashion   Radiological Exams on Admission: Independently  reviewed - see discussion in A/P where applicable  CT HEAD WO CONTRAST (5MM)  Result Date: 10/05/2021 CLINICAL DATA:  Provided history: Head trauma, minor.  Neck trauma. EXAM: CT HEAD WITHOUT CONTRAST CT CERVICAL SPINE WITHOUT CONTRAST TECHNIQUE: Multidetector CT imaging of the head and cervical spine was performed following the standard protocol without intravenous contrast. Multiplanar CT image reconstructions of the cervical spine were also generated. RADIATION DOSE REDUCTION: This exam was performed according to the departmental dose-optimization program which includes automated exposure control, adjustment of the mA and/or kV according to patient size and/or use of iterative reconstruction technique. COMPARISON:  No pertinent prior exams available for comparison. FINDINGS: CT HEAD FINDINGS Brain: Mild-to-moderate generalized cerebral atrophy. Comparatively mild cerebellar atrophy. Moderately advanced patchy and confluent hypoattenuation within the cerebral white matter, nonspecific but compatible with chronic small vessel ischemic disease. Partially empty sella turcica. There is no acute intracranial hemorrhage. No demarcated cortical infarct. No extra-axial fluid collection. No evidence of an intracranial mass. No midline shift. Vascular: No hyperdense vessel.  Atherosclerotic calcifications. Skull: No fracture or aggressive osseous lesion. Sinuses/Orbits: No mass or acute finding within the imaged orbits. Mild mucosal thickening within the bilateral ethmoid sinuses. 11 mm left maxillary sinus mucous retention cyst. Other: Large right middle ear/mastoid effusion. CT CERVICAL SPINE FINDINGS Alignment: Trace C5-C6 grade 1 anterolisthesis. Skull base and vertebrae: The basion-dental and atlanto-dental intervals are maintained.No evidence of acute fracture to the cervical spine. Soft tissues and spinal canal: No prevertebral fluid or swelling. No visible canal hematoma. Disc levels: Cervical spondylosis. No  more than mild disc space narrowing. Multilevel disc bulges/central disc protrusions, uncovertebral hypertrophy and facet arthrosis. No appreciable high-grade spinal canal stenosis. No significant bony neural foraminal narrowing. Upper chest: No consolidation within the imaged lung apices. No visible pneumothorax. Biapical pleuroparenchymal scarring. IMPRESSION: CT head: 1. No evidence of acute intracranial abnormality. 2. Moderately advanced chronic small vessel ischemic changes within cerebral  white matter. 3. Mild-to-moderate cerebral atrophy. Comparatively mild cerebellar atrophy. 4. Mild paranasal sinus disease, as described. 5. Large right middle ear/mastoid effusion CT cervical spine: 1. No evidence of acute fracture to the cervical spine. 2. Mild C5-C6 grade 1 anterolisthesis. 3. Cervical spondylosis, as described. Electronically Signed   By: Kellie Simmering D.O.   On: 09/22/2021 12:14   CT Cervical Spine Wo Contrast  Result Date: 10/13/2021 CLINICAL DATA:  Provided history: Head trauma, minor.  Neck trauma. EXAM: CT HEAD WITHOUT CONTRAST CT CERVICAL SPINE WITHOUT CONTRAST TECHNIQUE: Multidetector CT imaging of the head and cervical spine was performed following the standard protocol without intravenous contrast. Multiplanar CT image reconstructions of the cervical spine were also generated. RADIATION DOSE REDUCTION: This exam was performed according to the departmental dose-optimization program which includes automated exposure control, adjustment of the mA and/or kV according to patient size and/or use of iterative reconstruction technique. COMPARISON:  No pertinent prior exams available for comparison. FINDINGS: CT HEAD FINDINGS Brain: Mild-to-moderate generalized cerebral atrophy. Comparatively mild cerebellar atrophy. Moderately advanced patchy and confluent hypoattenuation within the cerebral white matter, nonspecific but compatible with chronic small vessel ischemic disease. Partially empty sella  turcica. There is no acute intracranial hemorrhage. No demarcated cortical infarct. No extra-axial fluid collection. No evidence of an intracranial mass. No midline shift. Vascular: No hyperdense vessel.  Atherosclerotic calcifications. Skull: No fracture or aggressive osseous lesion. Sinuses/Orbits: No mass or acute finding within the imaged orbits. Mild mucosal thickening within the bilateral ethmoid sinuses. 11 mm left maxillary sinus mucous retention cyst. Other: Large right middle ear/mastoid effusion. CT CERVICAL SPINE FINDINGS Alignment: Trace C5-C6 grade 1 anterolisthesis. Skull base and vertebrae: The basion-dental and atlanto-dental intervals are maintained.No evidence of acute fracture to the cervical spine. Soft tissues and spinal canal: No prevertebral fluid or swelling. No visible canal hematoma. Disc levels: Cervical spondylosis. No more than mild disc space narrowing. Multilevel disc bulges/central disc protrusions, uncovertebral hypertrophy and facet arthrosis. No appreciable high-grade spinal canal stenosis. No significant bony neural foraminal narrowing. Upper chest: No consolidation within the imaged lung apices. No visible pneumothorax. Biapical pleuroparenchymal scarring. IMPRESSION: CT head: 1. No evidence of acute intracranial abnormality. 2. Moderately advanced chronic small vessel ischemic changes within cerebral white matter. 3. Mild-to-moderate cerebral atrophy. Comparatively mild cerebellar atrophy. 4. Mild paranasal sinus disease, as described. 5. Large right middle ear/mastoid effusion CT cervical spine: 1. No evidence of acute fracture to the cervical spine. 2. Mild C5-C6 grade 1 anterolisthesis. 3. Cervical spondylosis, as described. Electronically Signed   By: Kellie Simmering D.O.   On: 10/07/2021 12:14   DG Chest Port 1 View  Result Date: 10/05/2021 CLINICAL DATA:  Provided history: Edema. EXAM: PORTABLE CHEST 1 VIEW COMPARISON:  Prior chest radiographs 09/22/2021 and earlier.  FINDINGS: Cardiomegaly. Aortic atherosclerosis. Band like opacity within the perihilar right lung with an appearance most suggestive of atelectasis/scarring. No appreciable airspace consolidation within the left lung. No evidence of pleural effusion or pneumothorax. No acute bony abnormality identified. IMPRESSION: No evidence of pulmonary edema. Band like opacity within the perihilar right lung. This has an appearance most suggestive of atelectasis/scarring. However, pneumonia is difficult to definitively exclude and clinical correlation is recommended. Cardiomegaly. Aortic Atherosclerosis (ICD10-I70.0). Electronically Signed   By: Kellie Simmering D.O.   On: 10/09/2021 11:27   DG Knee Complete 4 Views Right  Result Date: 10/21/2021 CLINICAL DATA:  Edema. EXAM: RIGHT KNEE - COMPLETE 4+ VIEW COMPARISON:  None Available. FINDINGS: No evidence of fracture, dislocation,  or joint effusion. No evidence of arthropathy or other focal bone abnormality. Soft tissues are unremarkable. IMPRESSION: Negative. Electronically Signed   By: Marijo Conception M.D.   On: 10/05/2021 11:23   DG Hip Unilat W or Wo Pelvis 2-3 Views Right  Result Date: 10/17/2021 CLINICAL DATA:  Edema. EXAM: DG HIP (WITH OR WITHOUT PELVIS) 2-3V RIGHT COMPARISON:  None Available. FINDINGS: There is no evidence of hip fracture or dislocation. Mild narrowing and osteophyte formation is seen involving the right hip. IMPRESSION: Mild degenerative joint disease of right hip. No acute abnormality seen. Electronically Signed   By: Marijo Conception M.D.   On: 09/30/2021 11:21   DG Shoulder Right  Result Date: 10/04/2021 CLINICAL DATA:  Edema. EXAM: RIGHT SHOULDER - 2+ VIEW COMPARISON:  None Available. FINDINGS: There are multiple small fragments involving the acromion suggesting minimally displaced fracture. Glenohumeral joint is unremarkable. No dislocation is noted IMPRESSION: Possible minimally displaced acromial fracture. Electronically Signed   By: Marijo Conception M.D.   On: 09/26/2021 11:19    EKG: Independently reviewed.  NSR with rate 57; prolonged QTc 558; nonspecific ST changes with NSCSLT   Labs on Admission: I have personally reviewed the available labs and imaging studies at the time of the admission.  Pertinent labs:    CO2 17 BUN 81/Creatinine 3.07/GFR 14 - stable Anion gap 18 Calcium 6.1; ionized Ca++ pending BNP 1083.7 HS troponin 29, 27 WBC 8.3 Hgb 10.4 Heme POSITIVE   Assessment and Plan: Principal Problem:   Acute upper GI bleeding Active Problems:   Pulmonary hypertension (HCC)   Cardiorenal syndrome with renal failure, stage 1-4 or unspecified chronic kidney disease, with heart failure (HCC)   End of life care   Upper GI Bleeding -Patient is presenting with recurrent melena and generalized weakness, suggestive of upper GI bleeding. -GI was consulted by ED; however, this has been canceled based on [place] discussion and the decision to transition to comfort care only  End of life care -Patient with multiple recent hospitalizations and multiple terminal conditions -After long discussion in the ER, patient has decided to proceed with comfort care only; family is supportive of this decision -She will be admitted to [facility] for comfort care and palliative care consult -Patient is likely to be a candidate for [facility] or other residential hospice, as she does not appear to be actively dying at this time -However, her reserve is likely quite low given her age and overall frailty -Comfort care order set utilized -Pain control with morphine drip (working well for pain despite risk of accumulation in the setting of renal failure)  Cardiorenal syndrome - chronic diastolic CHF -Patient recently admitted with anasarca, volume overload  -Echo was performed on 05/31/21 with preserved EF and showed grade 2 diastolic dysfunction -She was taken off torsemide several weeks prior to admission and she was diuresed with improvement in  symptoms -Mild edema now without frankly acute exacerbation   Cardiorenal syndrome - stage IV CKD -Patient's baseline creatinine is about 2.7 -It has been difficult to find an effective balance with her CHF and advanced renal failure -She acknowledges that she is a poor HD candidate and that previously she and her nephrologist decided that she would not pursue HD   Pulmonary HTN -Chronically on 4L-5 [place] O2   H/o DVT -Previously on Eliquis but this was stopped in the setting of recent GI bleeding and has not been restarted -s/p IVC filter placement on 2/9  Malnutrition -Body mass index is 18.18 kg/m, worse than prior        Advance Care Planning:   Code Status: DNR, comfort care    Consults: GI canceled; palliative care   DVT Prophylaxis: None   Family Communication: I spoke with her daughter by telephone and then she came in and was present for the [place] discussion   Severity of Illness: The appropriate patient status for this patient is INPATIENT. Inpatient status is judged to be reasonable and necessary in order to provide the required intensity of service to ensure the patient's safety. The patient's presenting symptoms, physical exam findings, and initial radiographic and laboratory data in the context of their chronic comorbidities is felt to place them at high risk for further clinical deterioration. Furthermore, it is not anticipated that the patient will be medically stable for discharge from the hospital within 2 midnights of admission.    * I certify that at the point of admission it is my clinical judgment that the patient will require inpatient hospital care spanning beyond 2 midnights from the point of admission due to high intensity of service, high risk for further deterioration and high frequency of surveillance required.*   Author: Karmen Bongo, MD 10/10/2021 5:55 PM  For on call review [email or web address].

## 2021-10-12 NOTE — ED Notes (Signed)
Orders for morphine gtt changed by MD, then changed back to morphine gtt. Morphine gtt not discontinued and administration unchanged from initial orders, documented as new gtt as new order was placed.

## 2021-10-12 NOTE — ED Notes (Signed)
Received verbal report from [facility] at this time

## 2021-10-12 NOTE — ED Provider Notes (Cosign Needed)
[facility] EMERGENCY DEPARTMENT Provider Note   CSN: [id number] Arrival date & time: 09/28/2021  1700     History  Chief Complaint  Patient presents with   Nausea   Emesis   Diarrhea   Weakness    Meghan Wise is a 86 y.o. female.  Meghan Wise is a 86 y.o. female  with a PMH significant for CHF, CKD stage 4, scleroderma, DVT (s/p IVC filter), ILD on 4L O2, and afib presenting today with nausea/vomiting, weakness, and black, tarry diarrhea. She reports the symptoms began on Sunday. The nausea and vomiting is alleviated with Zofran. She has baseline diarrhea that has been intermittent for 2 years but has increased in frequncy since Sunday and now appears black and tar like. She was previously on Eliquis, but was discontinued in February due to a GI bleed. She also endorses a fall on Sunday in which she stood up and her right leg "felt numb" and buckled. She is now reporting right shoulder and right knee pain. She denies head trauma, but is not 100% confident. Denies chest pain, SOB, headache, vision changes, abdominal pain, and hematemesis.  The history is provided by the patient and a relative.       Home Medications Prior to Admission medications   Medication Sig Start Date End Date Taking? Authorizing Provider  amiodarone (PACERONE) 200 MG tablet Take 1 tablet (200 mg total) by mouth 2 (two) times daily for 4 days, THEN 1 tablet (200 mg total) daily. Patient taking differently: 200 mg daily 06/05/21 11/26/21 Yes Elgergawy, Silver Huguenin, MD  amLODipine (NORVASC) 5 MG tablet Take 5 mg by mouth daily.   Yes [provider]  atorvastatin (LIPITOR) 20 MG tablet Take 20 mg by mouth at bedtime.   Yes [provider]  Cyanocobalamin (VITAMIN B-12 PO) Take 1 tablet by mouth 3 (three) times a week. MWF   Yes [provider]  denosumab (PROLIA) 60 MG/ML SOSY injection Inject 60 mg into the skin every 6 (six) months.   Yes [provider]  feeding supplement (ENSURE ENLIVE / ENSURE PLUS) LIQD Take 237 mLs by mouth 2 (two) times daily between meals. Patient taking differently: Take 237 mLs by mouth 2 (two) times a week. 09/25/21  Yes Pokhrel, Laxman, MD  ferrous sulfate 324 MG TBEC Take 324 mg by mouth daily.   Yes [provider]  folic acid (FOLVITE) 174 MCG tablet Take 800 mcg by mouth daily.   Yes [provider]  levothyroxine (SYNTHROID) 75 MCG tablet Take 1 tablet (75 mcg total) by mouth every morning. 09/25/21 09/25/22 Yes Pokhrel, Laxman, MD  loperamide (IMODIUM A-D) 2 MG tablet Take 4 mg by mouth 4 (four) times daily as needed for diarrhea or loose stools.    Yes [provider]  Melatonin 10 MG TABS Take 10 mg by mouth daily as needed (sleep).    Yes [provider]  ondansetron (ZOFRAN-ODT) 4 MG disintegrating tablet Take 4 mg by mouth 2 (two) times daily as needed. 09/28/21  Yes [provider]  OPSUMIT 10 MG tablet Take 10 mg by mouth daily. 09/10/21  Yes [provider]  pantoprazole (PROTONIX) 40 MG tablet Take 1 tablet (40 mg total) by mouth 2 (two) times daily. 06/05/21  Yes Elgergawy, Silver Huguenin, MD  predniSONE (DELTASONE) 5 MG tablet Take 5 mg by mouth daily with breakfast. Continuously   Yes [provider]  tadalafil, PAH, (ADCIRCA) 20 MG tablet Take  20 mg by mouth daily. 09/02/21  Yes [provider]  torsemide (DEMADEX) 10 MG tablet Take 2 tablets (20 mg total) by mouth daily. 09/25/21 09/25/22 Yes Pokhrel, Laxman, MD  OXYGEN Inhale 2-4 L into the lungs continuous. 4 L with exertion 2 L at night    [provider]      Allergies    Cozaar [losartan], Nsaids, Ace inhibitors, Sulfa antibiotics, Codeine, and Heparin    Review of Systems   Review of Systems  Constitutional:  Positive for fatigue.  HENT: Negative.    Respiratory:  Negative for shortness of breath.   Cardiovascular:  Positive for leg swelling. Negative for chest pain.   Gastrointestinal:  Positive for blood in stool, diarrhea and vomiting. Negative for abdominal pain.  Musculoskeletal:  Positive for arthralgias and myalgias.  Skin:  Negative for wound.  Neurological:  Positive for weakness. Negative for numbness and headaches.    Physical Exam Updated Vital Signs BP (!) 114/48   Pulse 61   Temp 98 F (36.7 C) (Oral)   Resp 13   Ht _0  (1.473 m)   Wt 39.5 kg   SpO2 100%   BMI 18.18 kg/m  Physical Exam Vitals and nursing note reviewed.  Constitutional:      General: She is not in acute distress.    Appearance: Normal appearance. She is well-developed. She is not diaphoretic.     Comments: Elderly female, chronically ill appearing, but in no acute distress  HENT:     Head: Normocephalic and atraumatic.  Eyes:     General:        Right eye: No discharge.        Left eye: No discharge.  Cardiovascular:     Rate and Rhythm: Normal rate and regular rhythm.     Pulses: Normal pulses.     Heart sounds: Normal heart sounds.  Pulmonary:     Effort: Pulmonary effort is normal. No respiratory distress.     Breath sounds: Normal breath sounds. No wheezing or rales.     Comments: Respirations equal and unlabored, patient able to speak in full sentences, lungs clear to auscultation bilaterally, on baseline 4L [place] Abdominal:     General: Bowel sounds are normal. There is no distension.     Palpations: Abdomen is soft. There is no mass.     Tenderness: There is no abdominal tenderness. There is no guarding.     Comments: Abdomen soft, nondistended, nontender to palpation in all quadrants without guarding or peritoneal signs  Genitourinary:    Comments: Chaperone present during rectal exam, tarry black stool present, no bright red blood Musculoskeletal:        General: No deformity.     Cervical back: Neck supple.     Right lower leg: Edema present.     Left lower leg: Edema present.     Comments: 3+ pitting edema up to the shins in bilateral  lower extremities  Skin:    General: Skin is warm and dry.     Capillary Refill: Capillary refill takes less than 2 seconds.  Neurological:     Mental Status: She is alert and oriented to person, place, and time.     Coordination: Coordination normal.     Comments: Speech is clear, able to follow commands Moves extremities without ataxia, coordination intact  Psychiatric:        Mood and Affect: Mood normal.        Behavior: Behavior normal.  ED Results / Procedures / Treatments   Labs (all labs ordered are listed, but only abnormal results are displayed) Labs Reviewed  COMPREHENSIVE METABOLIC PANEL - Abnormal; Notable for the following components:      Result Value   CO2 17 (*)    BUN 81 (*)    Creatinine, Ser 3.07 (*)    Calcium 6.1 (*)    GFR, Estimated 14 (*)    Anion gap 18 (*)    All other components within normal limits  CBC WITH DIFFERENTIAL/PLATELET - Abnormal; Notable for the following components:   RBC 3.24 (*)    Hemoglobin 10.4 (*)    HCT 32.3 (*)    RDW 18.0 (*)    Lymphs Abs 0.6 (*)    All other components within normal limits  BRAIN NATRIURETIC PEPTIDE - Abnormal; Notable for the following components:   B Natriuretic Peptide 1,083.7 (*)    All other components within normal limits  POC OCCULT BLOOD, ED - Abnormal; Notable for the following components:   Fecal Occult Bld POSITIVE (*)    All other components within normal limits  TROPONIN I (HIGH SENSITIVITY) - Abnormal; Notable for the following components:   Troponin I (High Sensitivity) 29 (*)    All other components within normal limits  TROPONIN I (HIGH SENSITIVITY) - Abnormal; Notable for the following components:   Troponin I (High Sensitivity) 28 (*)    All other components within normal limits  LACTIC ACID, PLASMA  CALCIUM, IONIZED  TYPE AND SCREEN    EKG EKG Interpretation  Date/Time:  Wednesday October 12 2021 10:56:52 EDT Ventricular Rate:  57 PR Interval:  171 QRS Duration: 117 QT  Interval:  573 QTC Calculation: 558 R Axis:   30 Text Interpretation: Sinus rhythm Incomplete right bundle branch block Nonspecific T abnormalities, lateral leads No significant change since last tracing Confirmed by Isla Pence [phone]) on 09/24/2021 10:58:24 AM  Radiology CT HEAD WO CONTRAST (5MM)  Result Date: 10/08/2021 CLINICAL DATA:  Provided history: Head trauma, minor.  Neck trauma. EXAM: CT HEAD WITHOUT CONTRAST CT CERVICAL SPINE WITHOUT CONTRAST TECHNIQUE: Multidetector CT imaging of the head and cervical spine was performed following the standard protocol without intravenous contrast. Multiplanar CT image reconstructions of the cervical spine were also generated. RADIATION DOSE REDUCTION: This exam was performed according to the departmental dose-optimization program which includes automated exposure control, adjustment of the mA and/or kV according to patient size and/or use of iterative reconstruction technique. COMPARISON:  No pertinent prior exams available for comparison. FINDINGS: CT HEAD FINDINGS Brain: Mild-to-moderate generalized cerebral atrophy. Comparatively mild cerebellar atrophy. Moderately advanced patchy and confluent hypoattenuation within the cerebral white matter, nonspecific but compatible with chronic small vessel ischemic disease. Partially empty sella turcica. There is no acute intracranial hemorrhage. No demarcated cortical infarct. No extra-axial fluid collection. No evidence of an intracranial mass. No midline shift. Vascular: No hyperdense vessel.  Atherosclerotic calcifications. Skull: No fracture or aggressive osseous lesion. Sinuses/Orbits: No mass or acute finding within the imaged orbits. Mild mucosal thickening within the bilateral ethmoid sinuses. 11 mm left maxillary sinus mucous retention cyst. Other: Large right middle ear/mastoid effusion. CT CERVICAL SPINE FINDINGS Alignment: Trace C5-C6 grade 1 anterolisthesis. Skull base and vertebrae: The basion-dental  and atlanto-dental intervals are maintained.No evidence of acute fracture to the cervical spine. Soft tissues and spinal canal: No prevertebral fluid or swelling. No visible canal hematoma. Disc levels: Cervical spondylosis. No more than mild disc space narrowing. Multilevel disc bulges/central disc protrusions,  uncovertebral hypertrophy and facet arthrosis. No appreciable high-grade spinal canal stenosis. No significant bony neural foraminal narrowing. Upper chest: No consolidation within the imaged lung apices. No visible pneumothorax. Biapical pleuroparenchymal scarring. IMPRESSION: CT head: 1. No evidence of acute intracranial abnormality. 2. Moderately advanced chronic small vessel ischemic changes within cerebral white matter. 3. Mild-to-moderate cerebral atrophy. Comparatively mild cerebellar atrophy. 4. Mild paranasal sinus disease, as described. 5. Large right middle ear/mastoid effusion CT cervical spine: 1. No evidence of acute fracture to the cervical spine. 2. Mild C5-C6 grade 1 anterolisthesis. 3. Cervical spondylosis, as described. Electronically Signed   By: Kellie Simmering D.O.   On: 09/24/2021 12:14   CT Cervical Spine Wo Contrast  Result Date: 10/20/2021 CLINICAL DATA:  Provided history: Head trauma, minor.  Neck trauma. EXAM: CT HEAD WITHOUT CONTRAST CT CERVICAL SPINE WITHOUT CONTRAST TECHNIQUE: Multidetector CT imaging of the head and cervical spine was performed following the standard protocol without intravenous contrast. Multiplanar CT image reconstructions of the cervical spine were also generated. RADIATION DOSE REDUCTION: This exam was performed according to the departmental dose-optimization program which includes automated exposure control, adjustment of the mA and/or kV according to patient size and/or use of iterative reconstruction technique. COMPARISON:  No pertinent prior exams available for comparison. FINDINGS: CT HEAD FINDINGS Brain: Mild-to-moderate generalized cerebral  atrophy. Comparatively mild cerebellar atrophy. Moderately advanced patchy and confluent hypoattenuation within the cerebral white matter, nonspecific but compatible with chronic small vessel ischemic disease. Partially empty sella turcica. There is no acute intracranial hemorrhage. No demarcated cortical infarct. No extra-axial fluid collection. No evidence of an intracranial mass. No midline shift. Vascular: No hyperdense vessel.  Atherosclerotic calcifications. Skull: No fracture or aggressive osseous lesion. Sinuses/Orbits: No mass or acute finding within the imaged orbits. Mild mucosal thickening within the bilateral ethmoid sinuses. 11 mm left maxillary sinus mucous retention cyst. Other: Large right middle ear/mastoid effusion. CT CERVICAL SPINE FINDINGS Alignment: Trace C5-C6 grade 1 anterolisthesis. Skull base and vertebrae: The basion-dental and atlanto-dental intervals are maintained.No evidence of acute fracture to the cervical spine. Soft tissues and spinal canal: No prevertebral fluid or swelling. No visible canal hematoma. Disc levels: Cervical spondylosis. No more than mild disc space narrowing. Multilevel disc bulges/central disc protrusions, uncovertebral hypertrophy and facet arthrosis. No appreciable high-grade spinal canal stenosis. No significant bony neural foraminal narrowing. Upper chest: No consolidation within the imaged lung apices. No visible pneumothorax. Biapical pleuroparenchymal scarring. IMPRESSION: CT head: 1. No evidence of acute intracranial abnormality. 2. Moderately advanced chronic small vessel ischemic changes within cerebral white matter. 3. Mild-to-moderate cerebral atrophy. Comparatively mild cerebellar atrophy. 4. Mild paranasal sinus disease, as described. 5. Large right middle ear/mastoid effusion CT cervical spine: 1. No evidence of acute fracture to the cervical spine. 2. Mild C5-C6 grade 1 anterolisthesis. 3. Cervical spondylosis, as described. Electronically Signed    By: Kellie Simmering D.O.   On: 09/29/2021 12:14   DG Chest Port 1 View  Result Date: 09/29/2021 CLINICAL DATA:  Provided history: Edema. EXAM: PORTABLE CHEST 1 VIEW COMPARISON:  Prior chest radiographs 09/22/2021 and earlier. FINDINGS: Cardiomegaly. Aortic atherosclerosis. Band like opacity within the perihilar right lung with an appearance most suggestive of atelectasis/scarring. No appreciable airspace consolidation within the left lung. No evidence of pleural effusion or pneumothorax. No acute bony abnormality identified. IMPRESSION: No evidence of pulmonary edema. Band like opacity within the perihilar right lung. This has an appearance most suggestive of atelectasis/scarring. However, pneumonia is difficult to definitively exclude and clinical correlation is  recommended. Cardiomegaly. Aortic Atherosclerosis (ICD10-I70.0). Electronically Signed   By: Kellie Simmering D.O.   On: 10/05/2021 11:27   DG Knee Complete 4 Views Right  Result Date: 10/18/2021 CLINICAL DATA:  Edema. EXAM: RIGHT KNEE - COMPLETE 4+ VIEW COMPARISON:  None Available. FINDINGS: No evidence of fracture, dislocation, or joint effusion. No evidence of arthropathy or other focal bone abnormality. Soft tissues are unremarkable. IMPRESSION: Negative. Electronically Signed   By: Marijo Conception M.D.   On: 09/27/2021 11:23   DG Hip Unilat W or Wo Pelvis 2-3 Views Right  Result Date: 10/13/2021 CLINICAL DATA:  Edema. EXAM: DG HIP (WITH OR WITHOUT PELVIS) 2-3V RIGHT COMPARISON:  None Available. FINDINGS: There is no evidence of hip fracture or dislocation. Mild narrowing and osteophyte formation is seen involving the right hip. IMPRESSION: Mild degenerative joint disease of right hip. No acute abnormality seen. Electronically Signed   By: Marijo Conception M.D.   On: 10/17/2021 11:21   DG Shoulder Right  Result Date: 10/08/2021 CLINICAL DATA:  Edema. EXAM: RIGHT SHOULDER - 2+ VIEW COMPARISON:  None Available. FINDINGS: There are multiple small  fragments involving the acromion suggesting minimally displaced fracture. Glenohumeral joint is unremarkable. No dislocation is noted IMPRESSION: Possible minimally displaced acromial fracture. Electronically Signed   By: Marijo Conception M.D.   On: 10/05/2021 11:19    Procedures .Critical Care  Performed by: Jacqlyn Larsen, PA-C Authorized by: Jacqlyn Larsen, PA-C   Critical care provider statement:    Critical care time (minutes):  30   Critical care was necessary to treat or prevent imminent or life-threatening deterioration of the following conditions:  Circulatory failure and metabolic crisis   Critical care was time spent personally by me on the following activities:  Development of treatment plan with patient or surrogate, discussions with consultants, evaluation of patient's response to treatment, examination of patient, ordering and review of laboratory studies, ordering and review of radiographic studies, ordering and performing treatments and interventions, pulse oximetry, re-evaluation of patient's condition and review of old charts     Medications Ordered in ED Medications  pantoprazole (PROTONIX) 80 mg /NS 100 mL IVPB (0 mg Intravenous Stopped 10/06/2021 1214)  calcium gluconate 1 g/ 50 mL sodium chloride IVPB (0 mg Intravenous Stopped 10/07/2021 1503)    ED Course/ Medical Decision Making/ A&P                           Medical Decision Making Problems Addressed: Acute GI bleeding: acute illness or injury that poses a threat to life or bodily functions Hypocalcemia: acute illness or injury that poses a threat to life or bodily functions  Amount and/or Complexity of Data Reviewed Independent Historian: caregiver External Data Reviewed: labs, radiology and notes. Labs: ordered. Radiology: ordered and independent interpretation performed. ECG/medicine tests: ordered and independent interpretation performed.  Risk Prescription drug management. Decision regarding  hospitalization.   Meghan Wise is a 86 y.o. female presents to the ED for concern of nausea, diarrhea, black stools, weakness, fall, this involves an extensive number of treatment options, and is a complaint that carries with it a high risk of complications and morbidity.  The differential diagnosis includes GI bleed, colitis, obstruction, dehydration, electrolyte derangement, anemia, fluid overload, fracture, intracranial bleed, skull or c-spine fx   Additional history obtained:  Additional history obtained from daughter at bedside External records from outside source obtained and reviewed including notes and labs from prior  admission for GI bleed 2/23   Lab Tests:  I Ordered, reviewed, and interpreted labs.  The pertinent results include:  hemoccult positive, no leukocytosis, Hgb stable, Calcium 6.1, ionized calcium sent, renal function slightly worsened from baseline, BUN 81, likely in setting of GI bleed, trop 18, BNP 1083.7, lactic WNL, type and screen pending   Imaging Studies ordered:  I ordered imaging studies including X-rays of chest, right shoulder, knee and hip, CT head and c-spine  I independently visualized and interpreted imaging which showed XR without PNA or obvious edema, linear opacity likely scarring. Slight fracture to the acromion, no other fractures noted, no intracranial bleeding. I agree with the radiologist interpretation   Cardiac Monitoring:  The patient was maintained on a cardiac monitor.  I personally viewed and interpreted the cardiac monitored which showed an underlying rhythm of: NSR   Medicines ordered and prescription drug management:  I ordered medication including protonix for GI bleed and calcium gluconate for hypocalcemia   I have reviewed the patients home medicines and have made adjustments as needed   ED Course:  Pt with hem + stools and concern for upper GI bleed, Hgb currently stable, but BUN elevated, started on protonix Pt  also noted to by hypocalcemic, ionized calcium sent and pt given 1 g calcium replacement, this may be contributing to generalized weakness GI to see pt, and medicine to admit   Consultations Obtained:  I requested consultation with the gastroenterologist, Dr. Collene Mares,  and discussed lab and imaging findings as well as pertinent plan - they recommend: Medicine admission and they will see the patient in consult today, agree with continuing Protonix for now I requested consultation with the hospitalist service for admission, case discussed with Dr. Lorin Mercy including pertinent labs, imaging and plan, she will see and admit the patient.     Dispostion:  After consideration of the diagnostic results and the patients response to treatment feel that the patent would benefit from admission.          Final Clinical Impression(s) / ED Diagnoses Final diagnoses:  Acute GI bleeding  Hypocalcemia    Rx / DC Orders ED Discharge Orders     None         Jacqlyn Larsen, [place] 09/25/2021 1840

## 2021-10-12 NOTE — ED Triage Notes (Signed)
Pt BIB [facility] from [place] [facility]. Pt c/o N/V/D and generalized weakness x2 weeks that has been recurrent over the last 2 years, pt also endorses black, tarry stools since Sunday with a hx of lower GI bleed. Pt a/ox4, appears in NAD. Denies pain. EMS reports initial BP sitting was 92/44, last BP laying was 126/86. Pt wears 4L O2 at baseline.

## 2021-10-12 NOTE — ED Notes (Signed)
Pt called out c/o increasing pain. Morphine bolus from bag given per prn order.

## 2021-10-13 ENCOUNTER — Ambulatory Visit: Payer: Medicare PPO | Admitting: Pulmonary Disease

## 2021-10-13 DIAGNOSIS — Z515 Encounter for palliative care: Principal | ICD-10-CM

## 2021-10-13 DIAGNOSIS — I5032 Chronic diastolic (congestive) heart failure: Secondary | ICD-10-CM | POA: Diagnosis not present

## 2021-10-13 DIAGNOSIS — Z66 Do not resuscitate: Secondary | ICD-10-CM

## 2021-10-13 DIAGNOSIS — Z7189 Other specified counseling: Secondary | ICD-10-CM

## 2021-10-13 DIAGNOSIS — I130 Hypertensive heart and chronic kidney disease with heart failure and stage 1 through stage 4 chronic kidney disease, or unspecified chronic kidney disease: Secondary | ICD-10-CM

## 2021-10-13 DIAGNOSIS — K922 Gastrointestinal hemorrhage, unspecified: Secondary | ICD-10-CM | POA: Diagnosis not present

## 2021-10-13 DIAGNOSIS — R638 Other symptoms and signs concerning food and fluid intake: Secondary | ICD-10-CM

## 2021-10-13 DIAGNOSIS — I272 Pulmonary hypertension, unspecified: Secondary | ICD-10-CM

## 2021-10-13 DIAGNOSIS — E43 Unspecified severe protein-calorie malnutrition: Secondary | ICD-10-CM

## 2021-10-13 DIAGNOSIS — Z86718 Personal history of other venous thrombosis and embolism: Secondary | ICD-10-CM

## 2021-10-13 DIAGNOSIS — Z789 Other specified health status: Secondary | ICD-10-CM

## 2021-10-13 LAB — CALCIUM, IONIZED: Calcium, Ionized, Serum: 3.1 mg/dL — ABNORMAL LOW (ref 4.5–5.6)

## 2021-10-13 NOTE — Consult Note (Cosign Needed)
Consultation Note Date: 10/13/2021   Patient Name: Meghan Wise  DOB: May 24, 1935  MRN: [id number]  Age / Sex: 86 y.o., female  PCP: Meghan Leyden, MD Referring Physician: Eugenie Filler, MD  Reason for Consultation: Establishing goals of care, " end-of-life care"  HPI/Patient Profile: 86 y.o. female  with past medical history of HTN, HLD, DVT (02/2021) s/p IVC filter placement, afib on Eliquis, hypothyroidism, pulmonary HTN on 4-5L home O2, stage 4 CKD, and scleroderma presented to ED on 10/18/2021 from [place] with complaints of nausea/vomiting/diarrhea as well as dark tarry stools since Sunday. Patient does have history of GI bleed.  After discussions with admitting provider patient opted for transition to full comfort measures. Patient was admitted on 09/24/2021 for end-of-life care and upper GI bleed..   Of note, patient has had 3 admissions in the last 6 months and is considered a 30-day readmission.  Her most recent admission was from 6/1 - 09/25/2021 with cardiorenal syndrome and found not a candidate for hemodialysis.  PMT is familiar with patient and family per previous hospitalizations.  Clinical Assessment and Goals of Care: I have reviewed medical records including EPIC notes, labs, and imaging. Received report from primary RN -no acute concerns.  RN reports patient is with minimal oral intake.  Went to visit patient at bedside -daughter/Meghan present. Patient was lying in bed asleep -she easily responds to voice/gentle touch.  She is chronically ill and frail appearing.  No signs or non-verbal gestures of pain or discomfort noted. No respiratory distress, increased work of breathing, or secretions noted.  Patient reports feeling "tired."  She denies pain, nausea, or shortness of breath.  Met with Meghan Wise to discuss diagnosis, prognosis, GOC, EOL wishes, disposition, and  options.  I re-introduced Palliative Medicine as specialized medical care for people living with serious illness. It focuses on providing relief from the symptoms and stress of a serious illness. The goal is to improve quality of life for both the patient and the family.  Reviewed interval history since last hospital admission.  Meghan Wise reports that patient has had steady decline since last hospital admission and fell in her apartment last Sunday.  She has been nauseated x2 weeks and has not felt well enough to play bridge with her friends, which she reports is important to the patient.  Patient has been "eating Zofran like candy."  Meghan Wise reports patient was in significant pain yesterday; reports this has improved today after initiation of morphine drip.  Daughter expresses gratefulness that patient could make her own decisions yesterday for transition to full comfort care.  Meghan Wise feels that patient is "fading out" today.  We discussed patient's current illness and what it means in the larger context of patient's on-going co-morbidities.  Meghan Wise has a good understanding of patient's acute current medical situation.  Natural disease trajectory and expectations at EOL were discussed. I attempted to elicit values and goals of care important to the patient.  Goal is clear to continue full comfort measures.   Prognostication was reviewed.  Reviewed option for discharge to hospice facility.  After discussion Meghan Wise would prefer patient remain in-house for end-of-life care.  Visit also consisted of discussions dealing with the complex and emotionally intense issues of symptom management and palliative care in the setting of serious and life-threatening illness.   Family is working on having priest arrive to perform last rights.  Discussed with patient/family the importance of continued conversation with each other and the medical providers regarding overall  plan of care and treatment options, ensuring decisions  are within the context of the patient's values and GOCs.    Questions and concerns were addressed. The patient/family was encouraged to call with questions and/or concerns. PMT card was provided.   Primary Decision Maker: HCPOA - daughter/Meghan Wise    SUMMARY OF RECOMMENDATIONS   Continue full comfort measures Continue DNR/DNI as previously documented Family wish for patient to remain in-house for end-of-life care - anticipate hospital death Continue current comfort focused medication regimen - patient appears comfortable Unrestricted visitation orders placed per current [place] end-of-life policy PMT will continue to follow and support holistically  Symptom Management Continue continuous morphine infusion; PRN doses for breakthrough symptoms Tylenol PRN pain/fever Biotin PRN dry mouth Benadryl PRN itching Robinul PRN secretions Haldol PRN agitation/delirium Ativan PRN anxiety/seizure/sleep/distress Zofran PRN nausea/vomiting Liquifilm Tears PRN dry eye   Code Status/Advance Care Planning: DNR  Palliative Prophylaxis:  Aspiration, Bowel Regimen, Delirium Protocol, Frequent Pain Assessment, Oral Care, and Turn Reposition  Additional Recommendations (Limitations, Scope, Preferences): Full Comfort Care  Psycho-social/Spiritual:  Desire for further Chaplaincy support:no Created space and opportunity for patient and family to express thoughts and feelings regarding patient's current medical situation.  Emotional support and therapeutic listening provided.  Prognosis:  Hours - Days  Discharge Planning: Anticipated Hospital Death      Primary Diagnoses: Present on Admission:  Acute upper GI bleeding  Cardiorenal syndrome with renal failure, stage 1-4 or unspecified chronic kidney disease, with heart failure ([place])  Pulmonary hypertension ([place])   I have reviewed the medical record, interviewed the patient and family, and examined the patient. The following  aspects are pertinent.  Past Medical History:  Diagnosis Date   Heart failure ([place])    HTN (hypertension)    Hyperlipidemia    Hypothyroidism    Kidney disease    Stage IV- Meghan Wise -LOV 02-11-14   Osteoarthritis    Osteoporosis    Pulmonary hypertension ([facility])    Raynaud disease    reactive with cold and stress mostly fingers.   Scleroderma ([place])    lungs and kidneys   Shortness of breath dyspnea    with exertion-in Cardiac rehab program at [facility].   Social History   Socioeconomic History   Marital status: Widowed    Spouse name: Not on file   Number of children: 5   Years of education: Not on file   Highest education level: Not on file  Occupational History   Occupation: Retired    Comment: Library Asst.  Tobacco Use   Smoking status: Former    Packs/day: 1.50    Years: 60.00    Total pack years: 90.00    Types: Cigarettes    Quit date: 04/25/1987    Years since quitting: 34.4   Smokeless tobacco: Never  Vaping Use   Vaping Use: Never used  Substance and Sexual Activity   Alcohol use: Yes    Comment: OCCASIONALLY   Drug use: No   Sexual activity: Not on file  Other Topics Concern   Not on file  Social History Narrative   Not on file   Social Determinants of Health   Financial Resource Strain: Not on file  Food Insecurity: Not on file  Transportation Needs: Not on file  Physical Activity: Not on file  Stress: Not on file  Social Connections: Not on file   Family History  Problem Relation Age of Onset   Heart disease Mother    Heart disease Father  Cancer Other        husband--esophageal   Diabetes Son    Stroke Daughter    Breast cancer Neg Hx    Scheduled Meds: Continuous Infusions:  morphine 5 mg/hr (10/13/21 0542)   PRN Meds:.acetaminophen **OR** acetaminophen, antiseptic oral rinse, diphenhydrAMINE, glycopyrrolate **OR** glycopyrrolate **OR** glycopyrrolate, haloperidol **OR** haloperidol **OR** haloperidol lactate, LORazepam **OR**  LORazepam **OR** LORazepam, morphine, ondansetron **OR** ondansetron (ZOFRAN) IV, polyvinyl alcohol Medications Prior to Admission:  Prior to Admission medications   Medication Sig Start Date End Date Taking? Authorizing Provider  amiodarone (PACERONE) 200 MG tablet Take 1 tablet (200 mg total) by mouth 2 (two) times daily for 4 days, THEN 1 tablet (200 mg total) daily. Patient taking differently: 200 mg daily 06/05/21 11/26/21 Yes Elgergawy, Silver Huguenin, MD  amLODipine (NORVASC) 5 MG tablet Take 5 mg by mouth daily.   Yes [provider]  atorvastatin (LIPITOR) 20 MG tablet Take 20 mg by mouth at bedtime.   Yes [provider]  Cyanocobalamin (VITAMIN B-12 PO) Take 1 tablet by mouth 3 (three) times a week. MWF   Yes [provider]  denosumab (PROLIA) 60 MG/ML SOSY injection Inject 60 mg into the skin every 6 (six) months.   Yes [provider]  feeding supplement (ENSURE ENLIVE / ENSURE PLUS) LIQD Take 237 mLs by mouth 2 (two) times daily between meals. Patient taking differently: Take 237 mLs by mouth 2 (two) times a week. 09/25/21  Yes Pokhrel, Laxman, MD  ferrous sulfate 324 MG TBEC Take 324 mg by mouth daily.   Yes [provider]  folic acid (FOLVITE) 390 MCG tablet Take 800 mcg by mouth daily.   Yes [provider]  levothyroxine (SYNTHROID) 75 MCG tablet Take 1 tablet (75 mcg total) by mouth every morning. 09/25/21 09/25/22 Yes Pokhrel, Laxman, MD  loperamide (IMODIUM A-D) 2 MG tablet Take 4 mg by mouth 4 (four) times daily as needed for diarrhea or loose stools.    Yes [provider]  Melatonin 10 MG TABS Take 10 mg by mouth daily as needed (sleep).    Yes [provider]  ondansetron (ZOFRAN-ODT) 4 MG disintegrating tablet Take 4 mg by mouth 2 (two) times daily as needed. 09/28/21  Yes [provider]  OPSUMIT 10 MG tablet Take 10 mg by mouth daily. 09/10/21  Yes [provider]  pantoprazole (PROTONIX) 40 MG  tablet Take 1 tablet (40 mg total) by mouth 2 (two) times daily. 06/05/21  Yes Elgergawy, Silver Huguenin, MD  predniSONE (DELTASONE) 5 MG tablet Take 5 mg by mouth daily with breakfast. Continuously   Yes [provider]  tadalafil, PAH, (ADCIRCA) 20 MG tablet Take 20 mg by mouth daily. 09/02/21  Yes [provider]  torsemide (DEMADEX) 10 MG tablet Take 2 tablets (20 mg total) by mouth daily. 09/25/21 09/25/22 Yes Pokhrel, Laxman, MD  OXYGEN Inhale 2-4 L into the lungs continuous. 4 L with exertion 2 L at night    [provider]   Allergies  Allergen Reactions   Cozaar [Losartan] Other (See Comments) and Cough    High calcium count and renal problems   Nsaids     Other reaction(s): Kidney Disorder   Ace Inhibitors Other (See Comments) and Cough    Dizziness and coughing    Sulfa Antibiotics Hives   Codeine Nausea Only   Heparin Anxiety and Other (See Comments)    Pt reports that they have a sensitivity towards heparin. Pt becomes  depressed and anxious to the point of crying uncontrollably.    Review of Systems  Unable to perform ROS: Acuity of condition    Physical Exam Vitals and nursing note reviewed.  Constitutional:      General: She is not in acute distress.    Appearance: She is cachectic. She is ill-appearing.  Pulmonary:     Effort: No respiratory distress.  Skin:    General: Skin is warm and dry.  Neurological:     Mental Status: She is lethargic.     Motor: Weakness present.  Psychiatric:        Behavior: Behavior is cooperative.     Vital Signs: BP (!) 116/44 (BP Location: Left Arm)   Pulse 65   Temp 98.1 F (36.7 C) (Oral)   Resp 15   Ht _0  (1.473 m)   Wt 39.5 kg   SpO2 94%   BMI 18.18 kg/m  Pain Scale: 0-10   Pain Score: 0-No pain   SpO2: SpO2: 94 % O2 Device:SpO2: 94 % O2 Flow Rate: .O2 Flow Rate (L/min): 5 L/min  IO: Intake/output summary: No intake or output data in the 24 hours ending 10/13/21 1423  LBM: Last BM Date  : 10/09/2021 Baseline Weight: Weight: 39.5 kg Most recent weight: Weight: 39.5 kg     Palliative Assessment/Data: PPS 10%     Time In: 1215 Time Out: 1345 Time Total: 90 minutes Greater than 50%  of this time was spent counseling and coordinating care related to the above assessment and plan.  Signed by: Lin Landsman, NP   Please contact Palliative Medicine Team phone at [phone] for questions and concerns.  For individual provider: See Amion  *Portions of this note are a verbal dictation therefore any spelling and/or grammatical errors are due to the "[place] [employer]" system interpretation.

## 2021-10-13 NOTE — Progress Notes (Signed)
Mobility Specialist Progress Note:   10/13/21 1020  Mobility  Activity Turned to back - supine  Level of Assistance Moderate assist, patient does 50-74%  Assistive Device None  Activity Response Tolerated well  $Mobility charge 1 Mobility   Pt received laying horizontally on bed. Required modA to reposition. Pt left with all needs met, bed alarm on.   [facility] Acute Rehab Secure Chat or Office Phone: [phone]

## 2021-10-13 NOTE — Progress Notes (Signed)
Mobility Specialist Progress Note:   10/13/21 0930  Mobility  Activity Dangled on edge of bed  Level of Assistance Minimal assist, patient does 75% or more  Assistive Device Other (Comment) (HHA)  Activity Response Tolerated well  $Mobility charge 1 Mobility   Pt requesting to sit EOB. MinA via HHA required. Pt left with bed alarm on, NT aware.   [facility] Acute Rehab Secure Chat or Office Phone: [phone]

## 2021-10-13 NOTE — Progress Notes (Signed)
PROGRESS NOTE    DEHLIA KILNER  EPP:[id number] DOB: 11-27-1935 DOA: 09/26/2021 PCP: Collene Leyden, MD    Chief Complaint  Patient presents with   Nausea   Emesis   Diarrhea   Weakness    Brief Narrative:  HPI per Dr. Bethanne Ginger Blondell Reveal is a 86 y.o. female with medical history significant of HTN; HLD; DVT (02/2021) s/p IVC filter placement; afib on Eliquis; hypothyroidism; pulmonary HTN on 4-5L home O2; stage 4 CKD; and scleroderma presenting with SOB.  She was last admitted from 6/1-4 for cardiorenal syndrome with acute on chronic diastolic CHF, not a candidate for HD.  She was also previously admitted from 2/6-12 for acute on chronic respiratory failure with worsened anemia in the setting of acute GI bleeding on Eliquis.  She reports that since she has been home she just has gotten weaker.  She doesn't want to go on living like this if she is just going on this way.  She noticed tarry stools a couple of days ago.  She has had n/v and overall thinks she is ready to just be comfortable.   I spoke with her daughter, [name].  She has not been doing well since her last hospitalization.  She has not been having trouble breathing.  She had been on Cipro for bacterial overgrowth, started having diarrhea.  She felt great at the end of the hospital stay and this lasted a day or two and had gotten weaker.  She got nauseated and stayed that way until she got Zofran, "eating it like candy."  She said her kidney values were worse.  They had an appointment with nephrology a week or ago, canceled it due to nausea.  She has been through end of life issues with her cats over time.  Her daughter has been waiting for her to say she was ready for end of life care.  She feels like they have gone on long enough.  She doesn't want/can't do HD.         ER Course:  Repeat GI bleeding.  On baseline O2.  Low calcium repleting.  Tarry stools, nonbloody emesis.  Dr. Collene Mares will see, suggest Protonix.  Not a prior  candidate for endoscopy, ?now.  No longer on Eliquis.     Assessment & Plan:   Principal Problem:   Acute upper GI bleeding Active Problems:   Pulmonary hypertension (HCC)   Cardiorenal syndrome with renal failure, stage 1-4 or unspecified chronic kidney disease, with heart failure (HCC)   End of life care   Chronic diastolic CHF (congestive heart failure) (HCC)   History of DVT (deep vein thrombosis)  #1 melanotic stools/upper GI bleed -Patient noted to present with recurrent melena, generalized weakness concerning for upper GI bleed. -It is noted per admitting physician that GI was initially consulted however consult canceled based on [place] discussion and decision of patient to transition to comfort measures only. -Palliative care consultation pending.  2.  Cardiorenal syndrome/chronic diastolic CHF -Patient noted to have recently been admitted for volume overload, anasarca. -2D echo done 05/31/2021 with normal EF, grade 2 diastolic dysfunction. - patient noted to have been taken off her torsemide several weeks prior to admission and during that admission was diuresed with clinical improvement. -Patient with lower extremity edema however not frankly volume overloaded and in an acute exacerbation. -Patient wanting comfort measures only. -Lasix as needed for symptomatic treatment. -Palliative care consulted.  3.  End-of-life care -Patient noted to have had multiple  recent hospitalizations with multiple terminal conditions. -Admitting physician, Dr. Lorin Mercy had a long discussion in the ER with patient and family and patient decided to proceed with comfort care only and family supportive of patient's decision. -Patient admitted for comfort care, palliative care consultation placed for end-of-life care. -Patient may be a candidate for [facility] residential hospice. -Patient with low reserve, elderly, frail. -Patient on morphine drip for pain control. -Palliative care consultation  placed.  4.  Chronic kidney disease stage IV/cardiorenal syndrome -Patient noted to have a baseline creatinine of approximately 2.7. -Patient acknowledges a poor HD candidate and in previous discussions with her nephrologist decided patient would not proceed with HD when the time came. -Patient being transitioned to comfort measures. -Palliative care consultation placed.  5.  Chronic pulmonary hypertension -Patient on 45 L nasal cannula.  6.  History of DVT -Status post IVC filter placement 06/02/2021. -Patient noted to have been on Eliquis however discontinued in the setting of recent GI bleed and not resumed. -Patient wanted to transition to comfort measures. -Palliative care consultation pending.  7.  Severe protein calorie malnutrition -Patient being transitioned to comfort measures. -BMI of 18.18 kg/metered square   DVT prophylaxis: Comfort care Code Status: DNR Family Communication: Updated patient and daughter at bedside. Disposition: Residential hospice home versus home with hospice pending palliative care evaluation.  Status is: Inpatient Remains inpatient appropriate because: Severity of illness   Consultants:  Palliative care pending  Procedures:  CT head/CT C-spine 09/28/2021 Chest x-ray 10/13/2021 Plain films of the right knee 10/09/2021 Plain films of the right shoulder 10/02/2021    Antimicrobials:  None   Subjective: Laying in bed.  On 5 L nasal cannula.  Complain no shortness of breath.  No chest pain.  No abdominal pain.  Daughter at bedside.  Objective: Vitals:   10/13/21 0100 10/13/21 0115 10/13/21 0123 10/13/21 0153  BP: (!) 101/51 (!) 114/55  (!) 116/44  Pulse: (!) 56 (!) 57  65  Resp: _0 Temp:   98.4 F (36.9 C) 98.1 F (36.7 C)  TempSrc:   Oral Oral  SpO2: 97% 96%  94%  Weight:      Height:        Intake/Output Summary (Last 24 hours) at 10/13/2021 1824 Last data filed at 10/13/2021 1500 Gross per 24 hour  Intake 220 ml   Output --  Net 220 ml   Filed Weights   10/04/2021 0934  Weight: 39.5 kg    Examination:  General exam: NAD. Respiratory system: Left bibasilar crackles.  No wheezing, no rhonchi.  Fair air movement.   Cardiovascular system: S1 & S2 heard, RRR. No JVD, murmurs, rubs, gallops or clicks.  1-2+ bilateral lower extremity edema.   Gastrointestinal system: Abdomen is nondistended, soft and nontender. No organomegaly or masses felt. Normal bowel sounds heard. Central nervous system: Alert and oriented. No focal neurological deficits. Extremities: 1-2+ bilateral lower extremity edema.   Skin: No rashes, lesions or ulcers Psychiatry: Judgement and insight appear normal. Mood & affect appropriate.     Data Reviewed: I have personally reviewed following labs and imaging studies  CBC: Recent Labs  Lab 10/13/2021 0950  WBC 8.3  NEUTROABS 6.8  HGB 10.4*  HCT 32.3*  MCV 99.7  PLT 893    Basic Metabolic Panel: Recent Labs  Lab 10/19/2021 0950  NA 139  K 4.3  CL 104  CO2 17*  GLUCOSE 83  BUN 81*  CREATININE 3.07*  CALCIUM 6.1*  GFR: Estimated Creatinine Clearance: 8.5 mL/min (A) (by C-G formula based on SCr of 3.07 mg/dL (H)).  Liver Function Tests: Recent Labs  Lab 10/06/2021 0950  AST 28  ALT 29  ALKPHOS 72  BILITOT 0.6  PROT 6.8  ALBUMIN 3.5    CBG: No results for input(s): "GLUCAP" in the last 168 hours.   No results found for this or any previous visit (from the past 240 hour(s)).       Radiology Studies: CT HEAD WO CONTRAST (5MM)  Result Date: 10/21/2021 CLINICAL DATA:  Provided history: Head trauma, minor.  Neck trauma. EXAM: CT HEAD WITHOUT CONTRAST CT CERVICAL SPINE WITHOUT CONTRAST TECHNIQUE: Multidetector CT imaging of the head and cervical spine was performed following the standard protocol without intravenous contrast. Multiplanar CT image reconstructions of the cervical spine were also generated. RADIATION DOSE REDUCTION: This exam was performed  according to the departmental dose-optimization program which includes automated exposure control, adjustment of the mA and/or kV according to patient size and/or use of iterative reconstruction technique. COMPARISON:  No pertinent prior exams available for comparison. FINDINGS: CT HEAD FINDINGS Brain: Mild-to-moderate generalized cerebral atrophy. Comparatively mild cerebellar atrophy. Moderately advanced patchy and confluent hypoattenuation within the cerebral white matter, nonspecific but compatible with chronic small vessel ischemic disease. Partially empty sella turcica. There is no acute intracranial hemorrhage. No demarcated cortical infarct. No extra-axial fluid collection. No evidence of an intracranial mass. No midline shift. Vascular: No hyperdense vessel.  Atherosclerotic calcifications. Skull: No fracture or aggressive osseous lesion. Sinuses/Orbits: No mass or acute finding within the imaged orbits. Mild mucosal thickening within the bilateral ethmoid sinuses. 11 mm left maxillary sinus mucous retention cyst. Other: Large right middle ear/mastoid effusion. CT CERVICAL SPINE FINDINGS Alignment: Trace C5-C6 grade 1 anterolisthesis. Skull base and vertebrae: The basion-dental and atlanto-dental intervals are maintained.No evidence of acute fracture to the cervical spine. Soft tissues and spinal canal: No prevertebral fluid or swelling. No visible canal hematoma. Disc levels: Cervical spondylosis. No more than mild disc space narrowing. Multilevel disc bulges/central disc protrusions, uncovertebral hypertrophy and facet arthrosis. No appreciable high-grade spinal canal stenosis. No significant bony neural foraminal narrowing. Upper chest: No consolidation within the imaged lung apices. No visible pneumothorax. Biapical pleuroparenchymal scarring. IMPRESSION: CT head: 1. No evidence of acute intracranial abnormality. 2. Moderately advanced chronic small vessel ischemic changes within cerebral white matter.  3. Mild-to-moderate cerebral atrophy. Comparatively mild cerebellar atrophy. 4. Mild paranasal sinus disease, as described. 5. Large right middle ear/mastoid effusion CT cervical spine: 1. No evidence of acute fracture to the cervical spine. 2. Mild C5-C6 grade 1 anterolisthesis. 3. Cervical spondylosis, as described. Electronically Signed   By: Kellie Simmering D.O.   On: 10/19/2021 12:14   CT Cervical Spine Wo Contrast  Result Date: 10/18/2021 CLINICAL DATA:  Provided history: Head trauma, minor.  Neck trauma. EXAM: CT HEAD WITHOUT CONTRAST CT CERVICAL SPINE WITHOUT CONTRAST TECHNIQUE: Multidetector CT imaging of the head and cervical spine was performed following the standard protocol without intravenous contrast. Multiplanar CT image reconstructions of the cervical spine were also generated. RADIATION DOSE REDUCTION: This exam was performed according to the departmental dose-optimization program which includes automated exposure control, adjustment of the mA and/or kV according to patient size and/or use of iterative reconstruction technique. COMPARISON:  No pertinent prior exams available for comparison. FINDINGS: CT HEAD FINDINGS Brain: Mild-to-moderate generalized cerebral atrophy. Comparatively mild cerebellar atrophy. Moderately advanced patchy and confluent hypoattenuation within the cerebral white matter, nonspecific but compatible with chronic  small vessel ischemic disease. Partially empty sella turcica. There is no acute intracranial hemorrhage. No demarcated cortical infarct. No extra-axial fluid collection. No evidence of an intracranial mass. No midline shift. Vascular: No hyperdense vessel.  Atherosclerotic calcifications. Skull: No fracture or aggressive osseous lesion. Sinuses/Orbits: No mass or acute finding within the imaged orbits. Mild mucosal thickening within the bilateral ethmoid sinuses. 11 mm left maxillary sinus mucous retention cyst. Other: Large right middle ear/mastoid effusion. CT  CERVICAL SPINE FINDINGS Alignment: Trace C5-C6 grade 1 anterolisthesis. Skull base and vertebrae: The basion-dental and atlanto-dental intervals are maintained.No evidence of acute fracture to the cervical spine. Soft tissues and spinal canal: No prevertebral fluid or swelling. No visible canal hematoma. Disc levels: Cervical spondylosis. No more than mild disc space narrowing. Multilevel disc bulges/central disc protrusions, uncovertebral hypertrophy and facet arthrosis. No appreciable high-grade spinal canal stenosis. No significant bony neural foraminal narrowing. Upper chest: No consolidation within the imaged lung apices. No visible pneumothorax. Biapical pleuroparenchymal scarring. IMPRESSION: CT head: 1. No evidence of acute intracranial abnormality. 2. Moderately advanced chronic small vessel ischemic changes within cerebral white matter. 3. Mild-to-moderate cerebral atrophy. Comparatively mild cerebellar atrophy. 4. Mild paranasal sinus disease, as described. 5. Large right middle ear/mastoid effusion CT cervical spine: 1. No evidence of acute fracture to the cervical spine. 2. Mild C5-C6 grade 1 anterolisthesis. 3. Cervical spondylosis, as described. Electronically Signed   By: Kellie Simmering D.O.   On: 10/18/2021 12:14   DG Chest Port 1 View  Result Date: 10/04/2021 CLINICAL DATA:  Provided history: Edema. EXAM: PORTABLE CHEST 1 VIEW COMPARISON:  Prior chest radiographs 09/22/2021 and earlier. FINDINGS: Cardiomegaly. Aortic atherosclerosis. Band like opacity within the perihilar right lung with an appearance most suggestive of atelectasis/scarring. No appreciable airspace consolidation within the left lung. No evidence of pleural effusion or pneumothorax. No acute bony abnormality identified. IMPRESSION: No evidence of pulmonary edema. Band like opacity within the perihilar right lung. This has an appearance most suggestive of atelectasis/scarring. However, pneumonia is difficult to definitively exclude  and clinical correlation is recommended. Cardiomegaly. Aortic Atherosclerosis (ICD10-I70.0). Electronically Signed   By: Kellie Simmering D.O.   On: 10/17/2021 11:27   DG Knee Complete 4 Views Right  Result Date: 10/13/2021 CLINICAL DATA:  Edema. EXAM: RIGHT KNEE - COMPLETE 4+ VIEW COMPARISON:  None Available. FINDINGS: No evidence of fracture, dislocation, or joint effusion. No evidence of arthropathy or other focal bone abnormality. Soft tissues are unremarkable. IMPRESSION: Negative. Electronically Signed   By: Marijo Conception M.D.   On: 10/13/2021 11:23   DG Hip Unilat W or Wo Pelvis 2-3 Views Right  Result Date: 09/25/2021 CLINICAL DATA:  Edema. EXAM: DG HIP (WITH OR WITHOUT PELVIS) 2-3V RIGHT COMPARISON:  None Available. FINDINGS: There is no evidence of hip fracture or dislocation. Mild narrowing and osteophyte formation is seen involving the right hip. IMPRESSION: Mild degenerative joint disease of right hip. No acute abnormality seen. Electronically Signed   By: Marijo Conception M.D.   On: 10/03/2021 11:21   DG Shoulder Right  Result Date: 09/26/2021 CLINICAL DATA:  Edema. EXAM: RIGHT SHOULDER - 2+ VIEW COMPARISON:  None Available. FINDINGS: There are multiple small fragments involving the acromion suggesting minimally displaced fracture. Glenohumeral joint is unremarkable. No dislocation is noted IMPRESSION: Possible minimally displaced acromial fracture. Electronically Signed   By: Marijo Conception M.D.   On: 10/18/2021 11:19        Scheduled Meds:  Continuous Infusions:  morphine 5 mg/hr (  10/13/21 0542)     LOS: 1 day    Time spent: 35 minutes    Irine Seal, MD [facility]   To contact the attending provider between 7A-7P or the covering provider during after hours 7P-7A, please log into the web site [email or web address] and access using universal [place] password for that web site. If you do not have the password, please call the hospital operator.  10/13/2021,  6:24 PM

## 2021-10-14 DIAGNOSIS — I5032 Chronic diastolic (congestive) heart failure: Secondary | ICD-10-CM | POA: Diagnosis not present

## 2021-10-14 DIAGNOSIS — I130 Hypertensive heart and chronic kidney disease with heart failure and stage 1 through stage 4 chronic kidney disease, or unspecified chronic kidney disease: Secondary | ICD-10-CM | POA: Diagnosis not present

## 2021-10-14 DIAGNOSIS — Z515 Encounter for palliative care: Secondary | ICD-10-CM | POA: Diagnosis not present

## 2021-10-14 DIAGNOSIS — K922 Gastrointestinal hemorrhage, unspecified: Secondary | ICD-10-CM | POA: Diagnosis not present

## 2021-10-22 NOTE — Progress Notes (Signed)
Called to room, pt passed with family at bedside at 1606, verified by Harland Dingwall, RN.

## 2021-10-22 NOTE — Progress Notes (Signed)
30 ml of Morphine drip wasted in Stericycle and witnessed by Atha Starks, RN. [facility] bridge notified of death, family still at bedside with a few members coming to see pt. Meghan Wise, daughter, will take all belongings, including 2 hearing aides and her glasses and a bag of clothes. Dr. Janee Morn was notified of pt death.

## 2021-10-22 DEATH — deceased
